# Patient Record
Sex: Male | Born: 1940
Health system: Southern US, Community
[De-identification: ages and names within clinical notes are randomized; demographics above are authoritative.]

## PROBLEM LIST (undated history)

## (undated) DIAGNOSIS — G4733 Obstructive sleep apnea (adult) (pediatric): Secondary | ICD-10-CM

## (undated) DIAGNOSIS — M7989 Other specified soft tissue disorders: Secondary | ICD-10-CM

## (undated) DIAGNOSIS — Z860100 Personal history of colon polyps, unspecified: Secondary | ICD-10-CM

## (undated) DIAGNOSIS — I10 Essential (primary) hypertension: Secondary | ICD-10-CM

## (undated) DIAGNOSIS — R600 Localized edema: Secondary | ICD-10-CM

## (undated) DIAGNOSIS — Z7901 Long term (current) use of anticoagulants: Secondary | ICD-10-CM

## (undated) DIAGNOSIS — F3341 Major depressive disorder, recurrent, in partial remission: Secondary | ICD-10-CM

## (undated) DIAGNOSIS — Z8673 Personal history of transient ischemic attack (TIA), and cerebral infarction without residual deficits: Secondary | ICD-10-CM

## (undated) DIAGNOSIS — K219 Gastro-esophageal reflux disease without esophagitis: Secondary | ICD-10-CM

## (undated) DIAGNOSIS — N401 Enlarged prostate with lower urinary tract symptoms: Secondary | ICD-10-CM

## (undated) DIAGNOSIS — I639 Cerebral infarction, unspecified: Secondary | ICD-10-CM

## (undated) DIAGNOSIS — R319 Hematuria, unspecified: Secondary | ICD-10-CM

## (undated) DIAGNOSIS — I480 Paroxysmal atrial fibrillation: Secondary | ICD-10-CM

## (undated) DIAGNOSIS — C4490 Unspecified malignant neoplasm of skin, unspecified: Secondary | ICD-10-CM

## (undated) DIAGNOSIS — K759 Inflammatory liver disease, unspecified: Secondary | ICD-10-CM

## (undated) DIAGNOSIS — K5900 Constipation, unspecified: Secondary | ICD-10-CM

## (undated) DIAGNOSIS — R011 Cardiac murmur, unspecified: Secondary | ICD-10-CM

## (undated) DIAGNOSIS — I255 Ischemic cardiomyopathy: Secondary | ICD-10-CM

## (undated) DIAGNOSIS — K5790 Diverticulosis of intestine, part unspecified, without perforation or abscess without bleeding: Secondary | ICD-10-CM

## (undated) DIAGNOSIS — M1990 Unspecified osteoarthritis, unspecified site: Secondary | ICD-10-CM

## (undated) DIAGNOSIS — Z973 Presence of spectacles and contact lenses: Secondary | ICD-10-CM

## (undated) DIAGNOSIS — F419 Anxiety disorder, unspecified: Secondary | ICD-10-CM

## (undated) DIAGNOSIS — J392 Other diseases of pharynx: Secondary | ICD-10-CM

## (undated) DIAGNOSIS — Z87442 Personal history of urinary calculi: Secondary | ICD-10-CM

## (undated) DIAGNOSIS — Z95818 Presence of other cardiac implants and grafts: Secondary | ICD-10-CM

## (undated) DIAGNOSIS — F1320 Sedative, hypnotic or anxiolytic dependence, uncomplicated: Secondary | ICD-10-CM

## (undated) DIAGNOSIS — J309 Allergic rhinitis, unspecified: Secondary | ICD-10-CM

## (undated) HISTORY — PX: CHOLECYSTECTOMY: SHX55

## (undated) HISTORY — PX: WRIST FRACTURE SURGERY: SHX121

## (undated) HISTORY — DX: Sedative, hypnotic or anxiolytic dependence, uncomplicated: F13.20

## (undated) HISTORY — PX: FOOT SURGERY: SHX648

## (undated) HISTORY — PX: CYSTOSCOPY: SHX5120

## (undated) HISTORY — PX: APPENDECTOMY: SHX54

## (undated) HISTORY — DX: Major depressive disorder, recurrent, in partial remission: F33.41

## (undated) HISTORY — DX: Cardiac murmur, unspecified: R01.1

---
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
## 2007-10-01 ENCOUNTER — Encounter: Payer: Self-pay | Admitting: Internal Medicine

## 2007-10-03 ENCOUNTER — Encounter: Payer: Self-pay | Admitting: Internal Medicine

## 2007-10-05 ENCOUNTER — Ambulatory Visit: Payer: Self-pay | Admitting: Internal Medicine

## 2007-10-05 DIAGNOSIS — Z860100 Personal history of colon polyps, unspecified: Secondary | ICD-10-CM | POA: Insufficient documentation

## 2007-10-05 DIAGNOSIS — I10 Essential (primary) hypertension: Secondary | ICD-10-CM

## 2007-10-05 DIAGNOSIS — Z87442 Personal history of urinary calculi: Secondary | ICD-10-CM

## 2007-10-05 DIAGNOSIS — K219 Gastro-esophageal reflux disease without esophagitis: Secondary | ICD-10-CM | POA: Insufficient documentation

## 2007-10-05 DIAGNOSIS — J309 Allergic rhinitis, unspecified: Secondary | ICD-10-CM | POA: Insufficient documentation

## 2007-11-12 ENCOUNTER — Ambulatory Visit: Payer: Self-pay | Admitting: Internal Medicine

## 2007-11-12 DIAGNOSIS — J019 Acute sinusitis, unspecified: Secondary | ICD-10-CM

## 2007-11-13 ENCOUNTER — Telehealth (INDEPENDENT_AMBULATORY_CARE_PROVIDER_SITE_OTHER): Payer: Self-pay

## 2008-03-24 ENCOUNTER — Telehealth: Payer: Self-pay | Admitting: Internal Medicine

## 2008-08-21 ENCOUNTER — Ambulatory Visit: Payer: Self-pay | Admitting: Internal Medicine

## 2008-09-15 ENCOUNTER — Ambulatory Visit: Payer: Self-pay | Admitting: Family Medicine

## 2008-10-01 ENCOUNTER — Encounter: Payer: Self-pay | Admitting: Internal Medicine

## 2008-10-02 ENCOUNTER — Ambulatory Visit: Payer: Self-pay | Admitting: Internal Medicine

## 2008-10-02 DIAGNOSIS — G4733 Obstructive sleep apnea (adult) (pediatric): Secondary | ICD-10-CM | POA: Insufficient documentation

## 2008-10-06 LAB — CONVERTED CEMR LAB
Albumin: 4 g/dL (ref 3.5–5.2)
Alkaline Phosphatase: 101 units/L (ref 39–117)
Bilirubin, Direct: 0.1 mg/dL (ref 0.0–0.3)
Calcium: 9.9 mg/dL (ref 8.4–10.5)
Cholesterol: 190 mg/dL (ref 0–200)
Eosinophils Absolute: 0.2 10*3/uL (ref 0.0–0.7)
GFR calc Af Amer: 109 mL/min
GFR calc non Af Amer: 90 mL/min
Glucose, Bld: 114 mg/dL — ABNORMAL HIGH (ref 70–99)
HCT: 44.2 % (ref 39.0–52.0)
HDL: 34.7 mg/dL — ABNORMAL LOW (ref >39.0)
Hemoglobin: 15.4 g/dL (ref 13.0–17.0)
MCHC: 34.7 g/dL (ref 30.0–36.0)
MCV: 89.4 fL (ref 78.0–100.0)
Monocytes Absolute: 0.6 10*3/uL (ref 0.1–1.0)
Monocytes Relative: 9.5 % (ref 3.0–12.0)
Neutro Abs: 3.7 10*3/uL (ref 1.4–7.7)
Platelets: 209 10*3/uL (ref 150–400)
Potassium: 3.6 meq/L (ref 3.5–5.1)
RDW: 11.9 % (ref 11.5–14.6)
Sodium: 145 meq/L (ref 135–145)
TSH: 0.7 microintl units/mL (ref 0.35–5.50)
Total Protein: 7.9 g/dL (ref 6.0–8.3)
Triglycerides: 118 mg/dL (ref 0–149)

## 2008-10-22 ENCOUNTER — Encounter: Payer: Self-pay | Admitting: Internal Medicine

## 2008-10-22 ENCOUNTER — Ambulatory Visit (HOSPITAL_BASED_OUTPATIENT_CLINIC_OR_DEPARTMENT_OTHER): Admission: RE | Admit: 2008-10-22 | Discharge: 2008-10-22 | Payer: Self-pay | Admitting: Internal Medicine

## 2008-11-05 ENCOUNTER — Ambulatory Visit: Payer: Self-pay | Admitting: Pulmonary Disease

## 2008-11-17 ENCOUNTER — Ambulatory Visit: Payer: Self-pay | Admitting: Internal Medicine

## 2008-11-17 LAB — CONVERTED CEMR LAB
AST: 29 units/L (ref 0–37)
Bilirubin, Direct: 0.1 mg/dL (ref 0.0–0.3)
Total Bilirubin: 0.9 mg/dL (ref 0.3–1.2)

## 2009-02-26 ENCOUNTER — Ambulatory Visit: Payer: Self-pay | Admitting: Family Medicine

## 2009-03-10 ENCOUNTER — Ambulatory Visit: Payer: Self-pay | Admitting: Internal Medicine

## 2009-04-08 ENCOUNTER — Ambulatory Visit: Payer: Self-pay | Admitting: Internal Medicine

## 2009-04-08 DIAGNOSIS — N400 Enlarged prostate without lower urinary tract symptoms: Secondary | ICD-10-CM | POA: Insufficient documentation

## 2009-05-08 ENCOUNTER — Encounter: Payer: Self-pay | Admitting: Internal Medicine

## 2009-07-08 ENCOUNTER — Encounter: Payer: Self-pay | Admitting: Internal Medicine

## 2009-07-08 ENCOUNTER — Ambulatory Visit: Payer: Self-pay | Admitting: Internal Medicine

## 2009-08-11 ENCOUNTER — Encounter (INDEPENDENT_AMBULATORY_CARE_PROVIDER_SITE_OTHER): Payer: Self-pay | Admitting: *Deleted

## 2009-08-12 ENCOUNTER — Encounter: Admission: RE | Admit: 2009-08-12 | Discharge: 2009-08-12 | Payer: Self-pay | Admitting: *Deleted

## 2009-11-02 ENCOUNTER — Ambulatory Visit: Payer: Self-pay | Admitting: Internal Medicine

## 2009-11-02 LAB — CONVERTED CEMR LAB
ALT: 28 units/L (ref 0–53)
AST: 23 units/L (ref 0–37)
Alkaline Phosphatase: 71 units/L (ref 39–117)
BUN: 13 mg/dL (ref 6–23)
Bilirubin, Direct: 0 mg/dL (ref 0.0–0.3)
Cholesterol: 180 mg/dL (ref 0–200)
Creatinine, Ser: 1.2 mg/dL (ref 0.4–1.5)
Eosinophils Relative: 5.3 % — ABNORMAL HIGH (ref 0.0–5.0)
GFR calc non Af Amer: 64.18 mL/min (ref >60)
LDL Cholesterol: 103 mg/dL — ABNORMAL HIGH (ref 0–99)
Lymphocytes Relative: 36.9 % (ref 12.0–46.0)
Monocytes Relative: 10.9 % (ref 3.0–12.0)
Neutrophils Relative %: 46.6 % (ref 43.0–77.0)
PSA: 2.41 ng/mL (ref 0.10–4.00)
Platelets: 203 10*3/uL (ref 150.0–400.0)
RBC: 5.09 M/uL (ref 4.22–5.81)
Total Bilirubin: 0.5 mg/dL (ref 0.3–1.2)
Total CHOL/HDL Ratio: 4
Triglycerides: 185 mg/dL — ABNORMAL HIGH (ref 0.0–149.0)
VLDL: 37 mg/dL (ref 0.0–40.0)
WBC: 5.9 10*3/uL (ref 4.5–10.5)

## 2009-11-09 ENCOUNTER — Ambulatory Visit: Payer: Self-pay | Admitting: Internal Medicine

## 2010-03-01 ENCOUNTER — Ambulatory Visit: Payer: Self-pay | Admitting: Internal Medicine

## 2010-03-01 DIAGNOSIS — IMO0002 Reserved for concepts with insufficient information to code with codable children: Secondary | ICD-10-CM

## 2010-05-10 ENCOUNTER — Telehealth: Payer: Self-pay

## 2010-05-28 ENCOUNTER — Ambulatory Visit: Payer: Self-pay | Admitting: Internal Medicine

## 2010-06-10 ENCOUNTER — Telehealth: Payer: Self-pay | Admitting: Internal Medicine

## 2010-07-05 ENCOUNTER — Telehealth: Payer: Self-pay | Admitting: Internal Medicine

## 2010-09-21 ENCOUNTER — Ambulatory Visit
Admission: RE | Admit: 2010-09-21 | Discharge: 2010-09-21 | Payer: Self-pay | Source: Home / Self Care | Attending: Internal Medicine | Admitting: Internal Medicine

## 2010-09-23 ENCOUNTER — Ambulatory Visit
Admission: RE | Admit: 2010-09-23 | Discharge: 2010-09-23 | Payer: Self-pay | Source: Home / Self Care | Attending: Internal Medicine | Admitting: Internal Medicine

## 2010-09-23 ENCOUNTER — Encounter: Payer: Self-pay | Admitting: Internal Medicine

## 2010-10-21 NOTE — Progress Notes (Signed)
Summary: please advise of cough  Phone Note Call from Patient Call back at Home Phone [phone]   Caller: Patient---voice mail Reason for Call: Talk to Nurse Complaint: Cough/Sore throat Summary of Call: Has ? regarding his meds: Losartan HCTZ. Stated this med maybe causing his cough, which has worsened. Please advise. [employer] on [address]. Cough is affecting his job. Initial call taken by: Warnell Forester,  July 05, 2010 1:47 PM  Follow-up for Phone Call        hold losartan  for 4 weeks and assess cough- continue other BP meds; ROV 4 weeks Follow-up by: Gordy Savers  MD,  July 05, 2010 5:31 PM  Additional Follow-up for Phone Call Additional follow up Details #1::        Phone Call Completed.  Left message to inform.   Additional Follow-up by: Rudy Jew, RN,  July 05, 2010 5:42 PM    New/Updated Medications: LOSARTAN POTASSIUM-HCTZ 100-25 MG TABS (LOSARTAN POTASSIUM-HCTZ) one daily  Appended Document: please advise of cough Called again & left number [phone] about Rx.    Appended Document: please advise of cough Talked with patient. He continues to have probelm, but had not stopped the losartan.  Will stop it & see Dr. Kirtland Bouchard 4 weeks.

## 2010-10-21 NOTE — Assessment & Plan Note (Signed)
Summary: SHOULDER PAIN//CCM   Vital Signs:  Patient profile:   70 year old male Weight:      208 pounds Temp:     98.1 degrees F oral BP sitting:   118 / 70  (left arm) Cuff size:   regular  Vitals Entered By: Duard Brady LPN (March 01, 2010 1:50 PM) CC: c/o shoulder pain, (R) foot splinter Is Patient Diabetic? No   CC:  c/o shoulder pain and (R) foot splinter.  History of Present Illness: a 70 year old patient who is seen today with a number of concerns.  He has a couple week history of bilateral shoulder pain related to overuse and vigorous activities.  He also has a splinter in his right plantar foot over the metatarsal heads.  His main complaint is persistent postnasal drip, mild cough, and a constant need to clear his throat.Marland Kitchen  Antihypertensive medication includes lisinopril.  He does have a history of allergic rhinitis.  He has a history also of depression, which has been stable  Allergies (verified): No Known Drug Allergies  Past History:  Past Medical History: Reviewed history from 04/08/2009 and no changes required. Allergic rhinitis Anxiety Colonic polyps, hx of Depression GERD Hypertension Nephrolithiasis, hx of Benign prostatic hypertrophy  Review of Systems       The patient complains of hoarseness and prolonged cough.  The patient denies anorexia, fever, weight loss, weight gain, vision loss, decreased hearing, chest pain, syncope, dyspnea on exertion, peripheral edema, headaches, hemoptysis, abdominal pain, melena, hematochezia, severe indigestion/heartburn, hematuria, incontinence, genital sores, muscle weakness, suspicious skin lesions, transient blindness, difficulty walking, depression, unusual weight change, abnormal bleeding, enlarged lymph nodes, angioedema, breast masses, and testicular masses.    Physical Exam  General:  Well-developed,well-nourished,in no acute distress; alert,appropriate and cooperative throughout examination; normal blood  pressure Head:  Normocephalic and atraumatic without obvious abnormalities. No apparent alopecia or balding. Eyes:  No corneal or conjunctival inflammation noted. EOMI. Perrla. Funduscopic exam benign, without hemorrhages, exudates or papilledema. Vision grossly normal. Mouth:  Oral mucosa and oropharynx without lesions or exudates.  Teeth in good repair. Neck:  No deformities, masses, or tenderness noted. Lungs:  Normal respiratory effort, chest expands symmetrically. Lungs are clear to auscultation, no crackles or wheezes. Heart:  Normal rate and regular rhythm. S1 and S2 normal without gallop, murmur, click, rub or other extra sounds. Abdomen:  Bowel sounds positive,abdomen soft and non-tender without masses, organomegaly or hernias noted. Msk:  range of motion of the shoulders intact, but slightly uncomfortable Skin:  splinter removed from the plantar surface of the right foot near the fourth metatarsal head Cervical Nodes:  No lymphadenopathy noted Axillary Nodes:  No palpable lymphadenopathy   Impression & Recommendations:  Problem # 1:  HYPERTENSION (ICD-401.9)  The following medications were removed from the medication list:    Lisinopril-hydrochlorothiazide 20-12.5 Mg Tabs (Lisinopril-hydrochlorothiazide) .Marland Kitchen... 1 once daily His updated medication list for this problem includes:    Amlodipine Besylate 10 Mg Tabs (Amlodipine besylate) .Marland Kitchen... 1 once daily    Doxazosin Mesylate 4 Mg Tabs (Doxazosin mesylate) ..... One at bedtime    Losartan Potassium-hctz 100-25 Mg Tabs (Losartan potassium-hctz) ..... One daily  The following medications were removed from the medication list:    Lisinopril-hydrochlorothiazide 20-12.5 Mg Tabs (Lisinopril-hydrochlorothiazide) .Marland Kitchen... 1 once daily His updated medication list for this problem includes:    Amlodipine Besylate 10 Mg Tabs (Amlodipine besylate) .Marland Kitchen... 1 once daily    Doxazosin Mesylate 4 Mg Tabs (Doxazosin mesylate) ..... One at bedtime  Losartan Potassium-hctz 100-25 Mg Tabs (Losartan potassium-hctz) ..... One daily  Problem # 2:  FOOT&TOE SUP FB W/O MAJ OPN WND&W/O MENTION INF (ICD-917.6) splinter removed without difficulty  Problem # 3:  ALLERGIC RHINITIS (ICD-477.9)  His updated medication list for this problem includes:    Fluticasone Propionate 50 Mcg/act Susp (Fluticasone propionate) ..... Use each nares daily will try fluticasone, and may OTC antihistamine.  Also discontinue lisinopril and switched to Cozaar  His updated medication list for this problem includes:    Fluticasone Propionate 50 Mcg/act Susp (Fluticasone propionate) ..... Use each nares daily  Complete Medication List: 1)  Prilosec 10 Mg Cpdr (Omeprazole) .Marland Kitchen.. 1 once daily 2)  Adult Aspirin Ec Low Strength 81 Mg Tbec (Aspirin) .Marland Kitchen.. 1 once daily 3)  Amlodipine Besylate 10 Mg Tabs (Amlodipine besylate) .Marland Kitchen.. 1 once daily 4)  Trazodone Hcl 50 Mg Tabs (Trazodone hcl) .Marland Kitchen.. 1 once daily 5)  Lorazepam 1 Mg Tabs (Lorazepam) .... One twice daily 6)  Bupropion Hcl 150 Mg Xr12h-tab (Bupropion hcl) .... One twice daily 7)  Sertraline Hcl 50 Mg Tabs (Sertraline hcl) .... Once daily 8)  Cyclobenzaprine Hcl 10 Mg Tabs (Cyclobenzaprine hcl) .Marland Kitchen.. 1 tablet 3 times daily as needed 9)  Doxazosin Mesylate 4 Mg Tabs (Doxazosin mesylate) .... One at bedtime 10)  Losartan Potassium-hctz 100-25 Mg Tabs (Losartan potassium-hctz) .... One daily 11)  Fluticasone Propionate 50 Mcg/act Susp (Fluticasone propionate) .... Use each nares daily  Patient Instructions: 1)  Please schedule a follow-up appointment in 3 months. 2)  Limit your Sodium (Salt). 3)  Take 400-600mg  of Ibuprofen (Advil, Motrin) with food every 4-6 hours as needed for relief of pain or comfort of fever. 4)   Alavert once daily Prescriptions: FLUTICASONE PROPIONATE 50 MCG/ACT SUSP (FLUTICASONE PROPIONATE) use each nares daily  #1 x 4   Entered and Authorized by:   Gordy Savers  MD   Signed by:   Gordy Savers  MD on 03/01/2010   Method used:   Electronically to        [facility]  [phone]* (retail)       [address]       [place], [place]  [address]       Ph: [phone] or [phone]       Fax: [phone]   RxID:   [id number] LOSARTAN POTASSIUM-HCTZ 100-25 MG TABS (LOSARTAN POTASSIUM-HCTZ) one daily  #90 x 4   Entered and Authorized by:   Gordy Savers  MD   Signed by:   Gordy Savers  MD on 03/01/2010   Method used:   Electronically to        [facility]  [phone]* (retail)       [address]       [place], [place]  [address]       Ph: [phone] or [phone]       Fax: [phone]   RxID:   [id number]

## 2010-10-21 NOTE — Progress Notes (Signed)
Summary: less than 24hrs cancellation  Phone Note Call from Patient Call back at Home Phone [phone]   Caller: Patient Call For: Gordy Savers  MD Summary of Call: pt cancelled ov less than 24 hrs due to employee work shortaged Initial call taken by: Heron Sabins,  June 10, 2010 1:14 PM  Follow-up for Phone Call        was seen 05/28/2010 - rov in 6 mos.   no charge needed - noted. KIK Follow-up by: Duard Brady LPN,  June 10, 2010 1:18 PM

## 2010-10-21 NOTE — Assessment & Plan Note (Signed)
Summary: tb skin//ccm  Nurse Visit   Vitals Entered By: Duard Brady LPN (September 21, 2010 2:31 PM)  Allergies: No Known Drug Allergies  Immunizations Administered:  PPD Skin Test:    Vaccine Type: PPD    Site: left forearm    Mfr: Sanofi Pasteur    Dose: 0.1 ml    Route: ID    Given by: Duard Brady LPN    Exp. Date: 07/22/2011    Lot #: [id number]    Physician counseled: yes  Orders Added: 1)  TB Skin Test [86580] 2)  Admin 1st Vaccine [phone]

## 2010-10-21 NOTE — Letter (Signed)
Summary: TB Skin Test  All     ,     Phone:   Fax:           TB Skin Test    Jared [name]    Date TB Test Placed:  ________________  L or R forearm  TB Test Placed by:  ___________________  Lot #:  __________________        Expiration Date: _____________  Date TB Test Read:  ____________________    Result ___________MM  TB Test Read by:  _______________

## 2010-10-21 NOTE — Assessment & Plan Note (Signed)
Summary: TB reading (patient may be in later than scheduled.)  Nurse Visit   Allergies: No Known Drug Allergies  PPD Results    Date of reading: 09/23/2010    Results: < 5mm    Interpretation: negative

## 2010-10-21 NOTE — Assessment & Plan Note (Signed)
Summary: cpx/mm   Vital Signs:  Patient profile:   70 year old male Height:      71.5 inches Weight:      214 pounds O2 Sat:      97 % on Room air Temp:     98.8 degrees F oral BP sitting:   130 / 78  (right arm) Cuff size:   regular  Vitals Entered By: Duard Brady LPN (November 09, 2009 9:51 AM)  O2 Flow:  Room air CC: cpx - c/o sob with exercise Is Patient Diabetic? No   CC:  cpx - c/o sob with exercise.  History of Present Illness: 70 year old patient who is seen today for a comprehensive evaluation.  Medical problems include anxiety, depression.  He has a history of colonic polyps and his last colonoscopy was approximately 3 years ago.  He is followed by behavioral health.  He has treated hypertension and gastroesophageal reflux disease.  He has mild BPH, and history of obstructive sleep apnea. He has done quite well.  He had a single episode of becoming nauseated and lightheaded at the jamb after a prolonged period of abstinence from exercise.  Denies any exertional chest pain.  EKG reviewed today and was unremarkable.  His gastroesophageal reflux disease.  Has been stable.  Laboratory screen reviewed and unremarkable.  Preventive Screening-Counseling & Management  Alcohol-Tobacco     Smoking Status: never  Allergies: No Known Drug Allergies  Past History:  Past Medical History: Reviewed history from 04/08/2009 and no changes required. Allergic rhinitis Anxiety Colonic polyps, hx of Depression GERD Hypertension Nephrolithiasis, hx of Benign prostatic hypertrophy  Past Surgical History: Appendectomy Cholecystectomy history of fracture, right wrist x 2 history of fracture, left wrist status post surgery, left foot colonoscopy  2008  Family History: Reviewed history from 10/05/2007 and no changes required. father died age 44, MI, history hypertension mother died age 47 MI  No siblings  Social History: Reviewed history from 10/05/2007 and no  changes required. 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 No apparent alopecia or balding. Eyes:  No corneal or conjunctival inflammation noted. EOMI. Perrla. Funduscopic exam benign, without hemorrhages, exudates or papilledema. Vision grossly normal. Ears:  External ear exam shows no significant lesions or deformities.  Otoscopic examination reveals clear canals, tympanic membranes are intact bilaterally without bulging, retraction, inflammation or discharge. Hearing is grossly normal bilaterally. Nose:  External nasal examination shows no deformity or inflammation. Nasal mucosa are pink and moist without lesions or exudates. Mouth:  Oral mucosa and oropharynx without lesions or exudates.  Teeth in good repair. Neck:  No deformities, masses, or tenderness noted. cystic 3-cm mass in the anterior mid neck region Chest Wall:  No deformities, masses, tenderness or gynecomastia noted. Breasts:  No masses or gynecomastia noted Lungs:  Normal respiratory effort, chest expands symmetrically. Lungs are clear to auscultation, no crackles or wheezes. Heart:  Normal rate and regular rhythm. S1 and S2 normal without gallop, murmur, click, rub or other extra sounds. Abdomen:  Bowel sounds positive,abdomen soft and non-tender without masses, organomegaly or hernias noted. Rectal:  No external abnormalities noted. Normal sphincter tone. No rectal masses or tenderness. Genitalia:  Testes bilaterally descended without nodularity, tenderness or masses. No scrotal masses or lesions. No penis lesions or urethral discharge. Prostate:  1+ enlarged.  1+ enlarged.   Msk:  No deformity or scoliosis noted of  thoracic or lumbar spine.   Pulses:  R and L carotid,radial,femoral,dorsalis pedis and posterior tibial pulses are full and equal bilaterally Extremities:  No clubbing, cyanosis, edema, or deformity noted with normal full range of motion of all joints.   Neurologic:  No cranial nerve deficits noted. Station and gait are normal. Plantar reflexes are down-going bilaterally. DTRs are symmetrical throughout. Sensory, motor and coordinative functions appear intact. Skin:  Intact without suspicious lesions or rashes Cervical Nodes:  No lymphadenopathy noted Axillary Nodes:  No palpable lymphadenopathy Inguinal Nodes:  No significant adenopathy Psych:  Cognition and judgment appear intact. Alert and cooperative with normal attention span and concentration. No apparent delusions, illusions, hallucinations   Impression & Recommendations:  Problem # 1:  HEALTH SCREENING (ICD-V70.0)  Complete Medication List: 1)  Prilosec 10 Mg Cpdr (Omeprazole) .Marland Kitchen.. 1 once daily 2)  Adult Aspirin Ec Low Strength 81 Mg Tbec (Aspirin) .Marland Kitchen.. 1 once daily 3)  Amlodipine Besylate 10 Mg Tabs (Amlodipine besylate) .Marland Kitchen.. 1 once daily 4)  Lisinopril-hydrochlorothiazide 20-12.5 Mg Tabs (Lisinopril-hydrochlorothiazide) .Marland Kitchen.. 1 once daily 5)  Trazodone Hcl 50 Mg Tabs (Trazodone hcl) .Marland Kitchen.. 1 once daily 6)  Lorazepam 1 Mg Tabs (Lorazepam) .... One twice daily 7)  Bupropion Hcl 150 Mg Xr12h-tab (Bupropion hcl) .... One twice daily 8)  Sertraline Hcl 50 Mg Tabs (Sertraline hcl) .... Once daily 9)  Cyclobenzaprine Hcl 10 Mg Tabs (Cyclobenzaprine hcl) .Marland Kitchen.. 1 tablet 3 times daily as needed 10)  Doxazosin Mesylate 4 Mg Tabs (Doxazosin mesylate) .... One at bedtime  Other Orders: EKG w/ Interpretation (93000) Prescription Created Electronically [id number])  Patient Instructions: 1)  Please schedule a follow-up appointment in 6 months. 2)  Limit your Sodium (Salt). 3)  It is important that you exercise regularly at least 20 minutes 5  times a week. If you develop chest pain, have severe difficulty breathing, or feel very tired , stop exercising immediately and seek medical attention. Prescriptions: DOXAZOSIN MESYLATE 4 MG TABS (DOXAZOSIN MESYLATE) one at bedtime  #90 x 6   Entered and Authorized by:   Gordy Savers  MD   Signed by:   Gordy Savers  MD on 11/09/2009   Method used:   Print then Give to Patient   RxID:   [id number] TRAZODONE HCL 50 MG  TABS (TRAZODONE HCL) 1 once daily  #90 x 6   Entered and Authorized by:   Gordy Savers  MD   Signed by:   Gordy Savers  MD on 11/09/2009   Method used:   Print then Give to Patient   RxID:   [id number] LISINOPRIL-HYDROCHLOROTHIAZIDE 20-12.5 MG  TABS (LISINOPRIL-HYDROCHLOROTHIAZIDE) 1 once daily  #90 x 6   Entered and Authorized by:   Gordy Savers  MD   Signed by:   Gordy Savers  MD on 11/09/2009   Method used:   Print then Give to Patient   RxID:   [id number] AMLODIPINE BESYLATE 10 MG  TABS (AMLODIPINE BESYLATE) 1 once daily  #90 Each x 6   Entered and Authorized by:   Gordy Savers  MD   Signed by:   Gordy Savers  MD on 11/09/2009   Method used:   Print then Give to Patient   RxID:   [id number] PRILOSEC 10 MG  CPDR (OMEPRAZOLE) 1 once daily  #90 x 6   Entered and Authorized by:  Gordy Savers  MD   Signed by:   Gordy Savers  MD on 11/09/2009   Method used:   Print then Give to Patient   RxID:   [id number] DOXAZOSIN MESYLATE 4 MG TABS (DOXAZOSIN MESYLATE) one at bedtime  #90 x 6   Entered and Authorized by:   Gordy Savers  MD   Signed by:   Gordy Savers  MD on 11/09/2009   Method used:   Electronically to        [facility]  [phone]* (retail)       [address]       [place], [place]  [address]       Ph: [phone] or [phone]       Fax: [phone]   RxID:   [id number] TRAZODONE HCL 50 MG  TABS (TRAZODONE  HCL) 1 once daily  #90 x 6   Entered and Authorized by:   Gordy Savers  MD   Signed by:   Gordy Savers  MD on 11/09/2009   Method used:   Electronically to        [facility]  [phone]* (retail)       [address]       [place], [place]  [address]       Ph: [phone] or [phone]       Fax: [phone]   RxID:   [id number] LISINOPRIL-HYDROCHLOROTHIAZIDE 20-12.5 MG  TABS (LISINOPRIL-HYDROCHLOROTHIAZIDE) 1 once daily  #90 x 6   Entered and Authorized by:   Gordy Savers  MD   Signed by:   Gordy Savers  MD on 11/09/2009   Method used:   Electronically to        [facility]  [phone]* (retail)       [address]       [place], [place]  [address]       Ph: [phone] or [phone]       Fax: [phone]   RxID:   [id number] AMLODIPINE BESYLATE 10 MG  TABS (AMLODIPINE BESYLATE) 1 once daily  #90 Each x 6   Entered and Authorized by:   Gordy Savers  MD   Signed by:   Gordy Savers  MD on 11/09/2009   Method used:   Electronically to        [facility]  [phone]* (retail)       [address]       [place], [place]  [address]       Ph: [phone] or [phone]       Fax: [phone]   RxID:   [id number] PRILOSEC 10 MG  CPDR (OMEPRAZOLE) 1 once daily  #90 x 6   Entered and Authorized by:   Gordy Savers  MD   Signed by:   Gordy Savers  MD on 11/09/2009   Method used:   Electronically to        [facility]  [phone]* (retail)       [address]       [place], [place]  [address]       Ph: [phone] or [phone]       Fax: [phone]   RxID:  [id number]  

## 2010-10-21 NOTE — Progress Notes (Signed)
Summary: cx 6 mos appt.  Phone Note Outgoing Call   Call placed by: Duard Brady LPN,  May 10, 2010 9:20 AM Call placed to: Patient Summary of Call: called pt - 6 mos rov missed - per pt he called last thursday and got recording - cx appt will r/s at a later date. KIK Initial call taken by: Duard Brady LPN,  May 10, 2010 9:22 AM

## 2010-10-21 NOTE — Assessment & Plan Note (Signed)
Summary: 6 MONTH FOLLOW UP/CJR   Vital Signs:  Patient profile:   70 year old male Weight:      212 pounds Temp:     98.0 degrees F oral BP sitting:   120 / 80  (left arm) Cuff size:   regular  Vitals Entered By: Duard Brady LPN (May 28, 2010 10:41 AM) CC: 6 mos rov - doing ok , occasional congestion Is Patient Diabetic? No Flu Vaccine Consent Questions     Do you have a history of severe allergic reactions to this vaccine? no    Any prior history of allergic reactions to egg and/or gelatin? no    Do you have a sensitivity to the preservative Thimersol? no    Do you have a past history of Guillan-Barre Syndrome? no    Do you currently have an acute febrile illness? no    Have you ever had a severe reaction to latex? no    Vaccine information given and explained to patient? yes    Are you currently pregnant? no    Lot Number:[id number]   Exp Date:03/19/2011   Site Given  Left Deltoid IM   CC:  6 mos rov - doing ok  and occasional congestion.  History of Present Illness: 71 -year-old patient who is in today for follow-up of his hypertension.  He has a history of anxiety and depression, as well as gastroesophageal reflux disease.  Continues to have some shoulder discomfort, but recently well-controlled on Aleve.  No new concerns or complaints.  Allergies (verified): No Known Drug Allergies  Review of Systems  The patient denies anorexia, fever, weight loss, weight gain, vision loss, decreased hearing, hoarseness, chest pain, syncope, dyspnea on exertion, peripheral edema, prolonged cough, headaches, hemoptysis, abdominal pain, melena, hematochezia, severe indigestion/heartburn, hematuria, incontinence, genital sores, muscle weakness, suspicious skin lesions, transient blindness, difficulty walking, depression, unusual weight change, abnormal bleeding, enlarged lymph nodes, angioedema, breast masses, and testicular masses.    Physical Exam  General:   Well-developed,well-nourished,in no acute distress; alert,appropriate and cooperative throughout examination; blood pressure 120/78 Head:  Normocephalic and atraumatic without obvious abnormalities. No apparent alopecia or balding. Eyes:  No corneal or conjunctival inflammation noted. EOMI. Perrla. Funduscopic exam benign, without hemorrhages, exudates or papilledema. Vision grossly normal. Mouth:  Oral mucosa and oropharynx without lesions or exudates.  Teeth in good repair. Neck:  No deformities, masses, or tenderness noted. Lungs:  Normal respiratory effort, chest expands symmetrically. Lungs are clear to auscultation, no crackles or wheezes. Heart:  Normal rate and regular rhythm. S1 and S2 normal without gallop, murmur, click, rub or other extra sounds. Abdomen:  Bowel sounds positive,abdomen soft and non-tender without masses, organomegaly or hernias noted. Msk:  No deformity or scoliosis noted of thoracic or lumbar spine.   Pulses:  R and L carotid,radial,femoral,dorsalis pedis and posterior tibial pulses are full and equal bilaterally Extremities:  No clubbing, cyanosis, edema, or deformity noted with normal full range of motion of all joints.     Impression & Recommendations:  Problem # 1:  HYPERTENSION (ICD-401.9)  His updated medication list for this problem includes:    Amlodipine Besylate 10 Mg Tabs (Amlodipine besylate) .Marland Kitchen... 1 once daily    Doxazosin Mesylate 4 Mg Tabs (Doxazosin mesylate) ..... One at bedtime    Losartan Potassium-hctz 100-25 Mg Tabs (Losartan potassium-hctz) ..... One daily  His updated medication list for this problem includes:    Amlodipine Besylate 10 Mg Tabs (Amlodipine besylate) .Marland Kitchen... 1 once daily  Doxazosin Mesylate 4 Mg Tabs (Doxazosin mesylate) ..... One at bedtime    Losartan Potassium-hctz 100-25 Mg Tabs (Losartan potassium-hctz) ..... One daily  Problem # 2:  GERD (ICD-530.81)  His updated medication list for this problem includes:     Prilosec 10 Mg Cpdr (Omeprazole) .Marland Kitchen... 1 once daily  His updated medication list for this problem includes:    Prilosec 10 Mg Cpdr (Omeprazole) .Marland Kitchen... 1 once daily  Complete Medication List: 1)  Prilosec 10 Mg Cpdr (Omeprazole) .Marland Kitchen.. 1 once daily 2)  Adult Aspirin Ec Low Strength 81 Mg Tbec (Aspirin) .Marland Kitchen.. 1 once daily 3)  Amlodipine Besylate 10 Mg Tabs (Amlodipine besylate) .Marland Kitchen.. 1 once daily 4)  Trazodone Hcl 50 Mg Tabs (Trazodone hcl) .Marland Kitchen.. 1 once daily 5)  Lorazepam 1 Mg Tabs (Lorazepam) .... One twice daily 6)  Bupropion Hcl 150 Mg Xr12h-tab (Bupropion hcl) .... One twice daily 7)  Sertraline Hcl 50 Mg Tabs (Sertraline hcl) .... Once daily 8)  Cyclobenzaprine Hcl 10 Mg Tabs (Cyclobenzaprine hcl) .Marland Kitchen.. 1 tablet 3 times daily as needed 9)  Doxazosin Mesylate 4 Mg Tabs (Doxazosin mesylate) .... One at bedtime 10)  Losartan Potassium-hctz 100-25 Mg Tabs (Losartan potassium-hctz) .... One daily 11)  Fluticasone Propionate 50 Mcg/act Susp (Fluticasone propionate) .... Use each nares daily  Other Orders: Flu Vaccine 45yrs + MEDICARE PATIENTS (W1191) Administration Flu vaccine - MCR (Y7829)  Patient Instructions: 1)  Please schedule a follow-up appointment in 6 months for CPX  2)  Advised not to eat any food or drink any liquids after 10 PM the night before your procedure. 3)  Limit your Sodium (Salt) to less than 2 grams a day(slightly less than 1/2 a teaspoon) to prevent fluid retention, swelling, or worsening of symptoms. 4)  It is important that you exercise regularly at least 20 minutes 5 times a week. If you develop chest pain, have severe difficulty breathing, or feel very tired , stop exercising immediately and seek medical attention.

## 2010-11-08 ENCOUNTER — Encounter: Payer: Self-pay | Admitting: Internal Medicine

## 2010-11-08 ENCOUNTER — Ambulatory Visit (INDEPENDENT_AMBULATORY_CARE_PROVIDER_SITE_OTHER): Payer: Self-pay | Admitting: Internal Medicine

## 2010-11-08 DIAGNOSIS — I10 Essential (primary) hypertension: Secondary | ICD-10-CM

## 2010-11-08 NOTE — Patient Instructions (Signed)
Call or return to clinic prn if these symptoms worsen or fail to improve as anticipated.

## 2010-11-08 NOTE — Progress Notes (Signed)
  Subjective:    Patient ID: Jared [name], male    DOB: [birth year], 70 y.o.   MRN: [id number]  HPI   70 year old patient whosustained trauma to his right chest wall area 8 days ago.  He was doing some house repairs on a ladder approximately 5 to 5 a half feet above ground level.  He lost his balance, falling backwards, landing on his right chest wall region.  He continues to have pain involving the right lateral and anterior chest regions.  No shortness of breath, cough, hemoptysis.    Review of Systems  Constitutional: Negative for fever, chills, appetite change and fatigue.  HENT: Negative for hearing loss, ear pain, congestion, sore throat, trouble swallowing, neck stiffness, dental problem, voice change and tinnitus.   Eyes: Negative for pain, discharge and visual disturbance.  Respiratory: Negative for cough, chest tightness, shortness of breath, wheezing and stridor.   Cardiovascular: Positive for chest pain. Negative for palpitations and leg swelling.  Gastrointestinal: Negative for nausea, vomiting, abdominal pain, diarrhea, constipation, blood in stool and abdominal distention.  Genitourinary: Negative for urgency, hematuria, flank pain, discharge, difficulty urinating and genital sores.  Musculoskeletal: Negative for myalgias, back pain, joint swelling, arthralgias and gait problem.  Skin: Negative for rash.  Neurological: Negative for dizziness, syncope, speech difficulty, weakness, numbness and headaches.  Hematological: Negative for adenopathy. Does not bruise/bleed easily.  Psychiatric/Behavioral: Negative for behavioral problems and dysphoric mood. The patient is not nervous/anxious.        Objective:   Physical Exam  Constitutional: He appears well-developed and well-nourished. No distress.  Neck: Normal range of motion. Neck supple.       Freely movable soft tissue nodule in the anterior neck region, which is unchanged  Cardiovascular: Normal rate, regular rhythm and normal  heart sounds.   No murmur heard. Pulmonary/Chest: Effort normal and breath sounds normal. No respiratory distress. He has no wheezes. He has no rales. He exhibits tenderness.       Breath sounds are symmetrical Chest wall tenderness along the right anterolateral chest wall region          Assessment & Plan:  Traumatic right-sided chest wall pain-He does not require stronger analgesics.  He will take anti-inflammatory medications, as needed.  Will call if he develops any worsening symptoms Hypertension stable

## 2010-11-12 ENCOUNTER — Telehealth: Payer: Self-pay | Admitting: Internal Medicine

## 2010-11-12 DIAGNOSIS — R52 Pain, unspecified: Secondary | ICD-10-CM

## 2010-11-12 MED ORDER — HYDROCODONE-ACETAMINOPHEN 5-325 MG PO TABS: 2.0000 | ORAL_TABLET | Freq: Four times a day (QID) | ORAL | Status: DC | PRN

## 2010-11-12 NOTE — Telephone Encounter (Signed)
vicodin 5/325mg  1-2 po q6 hours prn pain, disp #40 with no refill.

## 2010-11-12 NOTE — Telephone Encounter (Signed)
Pt aware rx called to [facility] .KIK

## 2010-11-12 NOTE — Telephone Encounter (Signed)
I dont see any hx of anything being call out in past - please advise

## 2010-11-12 NOTE — Telephone Encounter (Signed)
Pt called and said that Dr Amador Cunas offered pt pain med if needed for cracked ribs, but pt declined. Pt has now decided that he is going to need to get a script for pain med. Pt says that he has taken Hydrocodone in the past. Pls call in to [facility] on [address].

## 2010-11-18 ENCOUNTER — Other Ambulatory Visit: Payer: Self-pay | Admitting: Internal Medicine

## 2010-11-29 ENCOUNTER — Other Ambulatory Visit: Payer: Self-pay | Admitting: Internal Medicine

## 2010-12-03 ENCOUNTER — Other Ambulatory Visit: Payer: Medicare Other | Admitting: Internal Medicine

## 2010-12-03 DIAGNOSIS — E079 Disorder of thyroid, unspecified: Secondary | ICD-10-CM

## 2010-12-03 DIAGNOSIS — R972 Elevated prostate specific antigen [PSA]: Secondary | ICD-10-CM

## 2010-12-03 DIAGNOSIS — E785 Hyperlipidemia, unspecified: Secondary | ICD-10-CM

## 2010-12-03 DIAGNOSIS — Z0000 Encounter for general adult medical examination without abnormal findings: Secondary | ICD-10-CM

## 2010-12-03 LAB — BASIC METABOLIC PANEL
CO2: 31 mEq/L (ref 19–32)
Chloride: 103 mEq/L (ref 96–112)
Creatinine, Ser: 1.2 mg/dL (ref 0.4–1.5)
Glucose, Bld: 102 mg/dL — ABNORMAL HIGH (ref 70–99)

## 2010-12-03 LAB — CBC
MCH: 30.2 pg (ref 26.0–34.0)
MCHC: 34.4 g/dL (ref 30.0–36.0)
MCV: 88 fL (ref 78.0–100.0)
Platelets: 203 10*3/uL (ref 150–400)
RBC: 4.83 MIL/uL (ref 4.22–5.81)

## 2010-12-03 LAB — HEPATIC FUNCTION PANEL
ALT: 32 U/L (ref 0–53)
AST: 30 U/L (ref 0–37)
Albumin: 3.9 g/dL (ref 3.5–5.2)

## 2010-12-03 LAB — LIPID PANEL
Total CHOL/HDL Ratio: 5
Triglycerides: 125 mg/dL (ref 0.0–149.0)

## 2010-12-03 LAB — PSA: PSA: 4.09 ng/mL — ABNORMAL HIGH (ref 0.10–4.00)

## 2010-12-09 ENCOUNTER — Encounter: Payer: Self-pay | Admitting: Internal Medicine

## 2010-12-09 ENCOUNTER — Ambulatory Visit (INDEPENDENT_AMBULATORY_CARE_PROVIDER_SITE_OTHER): Payer: Medicare Other | Admitting: Internal Medicine

## 2010-12-09 VITALS — BP 110/70 | HR 70 | Temp 97.9°F | Resp 18 | Ht 72.5 in | Wt 207.0 lb

## 2010-12-09 DIAGNOSIS — I10 Essential (primary) hypertension: Secondary | ICD-10-CM

## 2010-12-09 DIAGNOSIS — Z0000 Encounter for general adult medical examination without abnormal findings: Secondary | ICD-10-CM

## 2010-12-09 DIAGNOSIS — J309 Allergic rhinitis, unspecified: Secondary | ICD-10-CM

## 2010-12-09 DIAGNOSIS — N400 Enlarged prostate without lower urinary tract symptoms: Secondary | ICD-10-CM

## 2010-12-09 MED ORDER — DOXAZOSIN MESYLATE 4 MG PO TABS: 4.0000 mg | ORAL_TABLET | Freq: Every day | ORAL | Status: DC

## 2010-12-09 MED ORDER — SERTRALINE HCL 50 MG PO TABS: 50.0000 mg | ORAL_TABLET | Freq: Every day | ORAL | Status: DC

## 2010-12-09 MED ORDER — BUPROPION HCL ER (XL) 150 MG PO TB24: 150.0000 mg | ORAL_TABLET | Freq: Two times a day (BID) | ORAL | Status: DC

## 2010-12-09 MED ORDER — AMLODIPINE BESYLATE 10 MG PO TABS: 10.0000 mg | ORAL_TABLET | Freq: Every day | ORAL | Status: DC

## 2010-12-09 MED ORDER — OMEPRAZOLE 10 MG PO CPDR: 10.0000 mg | DELAYED_RELEASE_CAPSULE | Freq: Every day | ORAL | Status: DC

## 2010-12-09 MED ORDER — LOSARTAN POTASSIUM-HCTZ 100-25 MG PO TABS: 1.0000 | ORAL_TABLET | Freq: Every day | ORAL | Status: DC

## 2010-12-09 MED ORDER — LORAZEPAM 1 MG PO TABS: 1.0000 mg | ORAL_TABLET | Freq: Two times a day (BID) | ORAL | Status: DC

## 2010-12-09 NOTE — Patient Instructions (Signed)
Limit your sodium (Salt) intake    It is important that you exercise regularly, at least 20 minutes 3 to 4 times per week.  If you develop chest pain or shortness of breath seek  medical attention.  Please check your blood pressure on a regular basis.  If it is consistently greater than 150/90, please make an office appointment.  Return in 4 months for follow-up  

## 2010-12-09 NOTE — Progress Notes (Signed)
  Subjective:    Patient ID: Jared [name], male    DOB: [birth year], 70 y.o.   MRN: [id number]  HPI   70 year old patient who is seen today for a preventive health examination. Medical problems include anxiety depression. He has been under considerable situational stress due to 2 some personal issues with his daughter. He has treated hypertension and allergic rhinitis. He has a history of BPH.  1. Risk factors, based on past  M,S,F history-   Cardiovascular risk factors include hypertension  2.  Physical activities: no activity restrictions does go to the health club infrequently.  3.  Depression/mood: positive for depression presently is on medication there have been some recent situational stressors  4.  Hearing: no deficits  5.  ADL's: independent in all aspects of daily living  6.  Fall risk:  low  7.  Home safety: no problems identified  8.  Height weight, and visual acuity; height and weight are stable no difficulty with visual acuity  9.  Counseling: more regular exercise encouraged heart healthy diet restricted salt  10. Lab orders based on risk factors: all discussed laboratory studies were reviewed. PSA was slightly elevated we'll recheck in 4-6 months  11. Referral : not appropriate at this time. Will need followup colonoscopy in one year  12. Care plan: heart healthy diet regular exercise all encouraged  13. Cognitive assessment:  Alert and appropriate with normal affect. No cognitive dysfunction.      CC: cpx - c/o sob with exercise.  History of Present Illness:  70 year old patient who is seen today for a comprehensive evaluation. Medical problems include anxiety, depression. He has a history of colonic polyps and his last colonoscopy was approximately 3 years ago. He is followed by behavioral health. He has treated hypertension and gastroesophageal reflux disease. He has mild BPH, and history of obstructive sleep apnea.  He has done quite well. He had a single  episode of becoming nauseated and lightheaded at the jamb after a prolonged period of abstinence from exercise. Denies any exertional chest pain. EKG reviewed today and was unremarkable. His gastroesophageal reflux disease. Has been stable. Laboratory screen reviewed and unremarkable.  Preventive Screening-Counseling & Management  Alcohol-Tobacco  Smoking Status: never  Allergies:  No Known Drug Allergies  Past History:  Past Medical History:  Reviewed history from 04/08/2009 and no changes required.  Allergic rhinitis  Anxiety  Colonic polyps, hx of  Depression  GERD  Hypertension  Nephrolithiasis, hx of  Benign prostatic hypertrophy  Past Surgical History:  Appendectomy  Cholecystectomy  history of fracture, right wrist x 2  history of fracture, left wrist  status post surgery, left foot  colonoscopy 2008  Family History:  Reviewed history from 10/05/2007 and no changes required.  father died age 62, MI, history hypertension  mother died age 88 MI  No siblings  Social History:  Reviewed history from 10/05/2007 and no changes required.  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

## 2010-12-10 ENCOUNTER — Telehealth: Payer: Self-pay

## 2010-12-10 MED ORDER — BUPROPION HCL ER (SR) 150 MG PO TB12: 150.0000 mg | ORAL_TABLET | Freq: Two times a day (BID) | ORAL | Status: DC

## 2010-12-10 NOTE — Telephone Encounter (Signed)
Faxed new rx to [facility] - med error

## 2011-02-01 NOTE — Procedures (Signed)
NAME:  Jared [name], Jared [name] NO.:  [id number]   MEDICAL RECORD NO.:  [id number]          PATIENT TYPE:  OUT   LOCATION:  SLEEP CENTER                 FACILITY[facility]  [facility]   PHYSICIAN:  Barbaraann Share, MD,FCCPDATE OF BIRTH:  24-Jun-1943   DATE OF STUDY:                            NOCTURNAL POLYSOMNOGRAM   REFERRING PHYSICIAN:   REFERRING PHYSICIAN:  Gordy Savers, MD   INDICATIONS FOR THE STUDY:  Hypersomnia with sleep apnea.   EPWORTH SCORE:  9.   SLEEP ARCHITECTURE:  The patient had a total sleep time of 218 minutes  with very little slow wave sleep or REM noted.  Sleep onset latency was  normal at 15 minutes, and REM onset was fairly rapid at 4 minutes.  Sleep efficiency was very poor during the night at 57% with very  fragmented sleep.   RESPIRATORY DATA:  The patient was found to have 11 obstructive apneas  and no hypopneas for an apnea-hypopnea index of only 3 events per hour.  However, he was also found to have 13 respiratory effort related to  arousals, giving him a respiratory disturbance index of 7 events per  hour.  All of the events occurred in the supine position, and there was  intermittent loud snoring noted throughout.   OXYGEN DATA:  There was O2 desaturation as low as 87% with his  obstructive events.   CARDIAC DATA:  Rare PVC noted, but no clinically significant arrhythmias  seen.   MOVEMENT/PARASOMNIA:  The patient was found to have 36 periodic leg  movements with only 1 per hour resulting in arousal or awakening.  This  is considered insignificant.   IMPRESSION/RECOMMENDATIONS:  1. Very mild obstructive sleep apnea/hypopnea syndrome with an apnea-      hypopnea index of 3 events per hour, but a respiratory disturbance      index of 7 events per hour.  The events occurred all in the supine      position, and there was oxygen desaturation as low as 86%.      Treatment for this degree of sleep apnea can include positional      therapy,  weight loss alone if applicable, upper airway surgery,      oral appliance, and also continuous positive airway pressure.  The      decision to treat this mild degree of sleep apnea should primarily      be based upon its      impact to his quality of life.  At this level, it does not      represent a significant issue for his cardiovascular health.  2. Rare premature ventricular contraction, but no clinically      significant arrhythmia seen.      Barbaraann Share, MD,FCCP  Diplomate, American Board of Sleep  Medicine  Electronically Signed     [facility]/MEDQ  D:  11/05/2008 11:12:20  T:  11/05/2008 23:40:30  Job:  [id number]

## 2011-03-10 ENCOUNTER — Encounter: Payer: Self-pay | Admitting: Internal Medicine

## 2011-03-10 ENCOUNTER — Ambulatory Visit (INDEPENDENT_AMBULATORY_CARE_PROVIDER_SITE_OTHER): Payer: Medicare Other | Admitting: Internal Medicine

## 2011-03-10 DIAGNOSIS — I10 Essential (primary) hypertension: Secondary | ICD-10-CM

## 2011-03-10 DIAGNOSIS — M25511 Pain in right shoulder: Secondary | ICD-10-CM

## 2011-03-10 DIAGNOSIS — M25519 Pain in unspecified shoulder: Secondary | ICD-10-CM

## 2011-03-10 NOTE — Progress Notes (Signed)
  Subjective:    Patient ID: Jared [name], male    DOB: 10-29-1940, 70 y.o.   MRN: [id number]  HPI  70 year old patient who is seen today for followup. He had a falloff a ladder after the first of the year and has had the intermittent right shoulder pain. This has flared up more severely recently. No recent trauma. No constitutional complaints    Review of Systems  Musculoskeletal: Positive for arthralgias.       Objective:   Physical Exam  Musculoskeletal:       Fairly full range of motion but painful abduction          Assessment & Plan:   Chronic right shoulder pain. Will refer to orthopedics for further evaluation and treatment. Will continue hydrocodone for pain. Anti-inflammatories recommended

## 2011-03-10 NOTE — Patient Instructions (Signed)
Take 400-600 mg of ibuprofen ( Advil, Motrin) with food every 4 to 6 hours as needed for pain relief or control of fever  Orthopedic referral as discussed  Call or return to clinic prn if these symptoms worsen or fail to improve as anticipated.

## 2011-04-14 ENCOUNTER — Ambulatory Visit: Payer: Medicare Other | Admitting: Internal Medicine

## 2011-04-18 ENCOUNTER — Telehealth: Payer: Self-pay | Admitting: Internal Medicine

## 2011-04-18 NOTE — Telephone Encounter (Signed)
Pt would like to change his pharmacy to [facility] @ [address] [place] [address] Phone: [phone]

## 2011-04-18 NOTE — Telephone Encounter (Signed)
Change made to pharmacy record

## 2011-05-06 ENCOUNTER — Ambulatory Visit (INDEPENDENT_AMBULATORY_CARE_PROVIDER_SITE_OTHER): Payer: Medicare Other | Admitting: Internal Medicine

## 2011-05-06 ENCOUNTER — Encounter: Payer: Self-pay | Admitting: Internal Medicine

## 2011-05-06 DIAGNOSIS — I10 Essential (primary) hypertension: Secondary | ICD-10-CM

## 2011-05-06 DIAGNOSIS — K219 Gastro-esophageal reflux disease without esophagitis: Secondary | ICD-10-CM

## 2011-05-06 DIAGNOSIS — N400 Enlarged prostate without lower urinary tract symptoms: Secondary | ICD-10-CM

## 2011-05-06 DIAGNOSIS — F329 Major depressive disorder, single episode, unspecified: Secondary | ICD-10-CM

## 2011-05-06 NOTE — Progress Notes (Signed)
  Subjective:    Patient ID: Jared [name], male    DOB: 1941/04/09, 70 y.o.   MRN: [id number]  HPI  70 year old patient who is in today for followup of his hypertension. He has recently been seen by dermatology and treated for diffuse actinic keratoses. He is anticipating bilateral shoulder surgery for degenerated rotator cuff tears. The right shoulder is more problematic. His cardiopulmonary status has been stable and his blood pressure is nicely controlled today. He has a history of anxiety depression which has done quite well he remains on sertraline and Wellbutrin. He has anxiety. He has gastroesophageal reflux disease well controlled on omeprazole. No new concerns or complaints.     Review of Systems  Constitutional: Negative for fever, chills, appetite change and fatigue.  HENT: Negative for hearing loss, ear pain, congestion, sore throat, trouble swallowing, neck stiffness, dental problem, voice change and tinnitus.   Eyes: Negative for pain, discharge and visual disturbance.  Respiratory: Negative for cough, chest tightness, wheezing and stridor.   Cardiovascular: Negative for chest pain, palpitations and leg swelling.  Gastrointestinal: Negative for nausea, vomiting, abdominal pain, diarrhea, constipation, blood in stool and abdominal distention.  Genitourinary: Negative for urgency, hematuria, flank pain, discharge, difficulty urinating and genital sores.  Musculoskeletal: Negative for myalgias, back pain, joint swelling, arthralgias and gait problem.  Skin: Negative for rash.  Neurological: Negative for dizziness, syncope, speech difficulty, weakness, numbness and headaches.  Hematological: Negative for adenopathy. Does not bruise/bleed easily.  Psychiatric/Behavioral: Negative for behavioral problems and dysphoric mood. The patient is not nervous/anxious.        Objective:   Physical Exam  Constitutional: He is oriented to person, place, and time. He appears well-developed.    HENT:  Head: Normocephalic.  Right Ear: External ear normal.  Left Ear: External ear normal.  Eyes: Conjunctivae and EOM are normal.  Neck: Normal range of motion.  Cardiovascular: Normal rate and normal heart sounds.   Pulmonary/Chest: Breath sounds normal.  Abdominal: Bowel sounds are normal.  Musculoskeletal: Normal range of motion. He exhibits no edema and no tenderness.  Neurological: He is alert and oriented to person, place, and time.  Psychiatric: He has a normal mood and affect. His behavior is normal.          Assessment & Plan:    hypertension well controlled Anxiety depression. Stable Bilateral degenerated rotator cuff tears. No contraindications to orthopedic surgery. Patient was told it would be gladly take care of any preoperative clearance. His cardiopulmonary status is stable  Recheck in 6 months

## 2011-05-06 NOTE — Patient Instructions (Signed)
Limit your sodium (Salt) intake    It is important that you exercise regularly, at least 20 minutes 3 to 4 times per week.  If you develop chest pain or shortness of breath seek  medical attention.  Return in 6 months for follow-up  

## 2011-08-30 ENCOUNTER — Other Ambulatory Visit: Payer: Self-pay | Admitting: Internal Medicine

## 2011-10-07 ENCOUNTER — Telehealth: Payer: Self-pay | Admitting: Internal Medicine

## 2011-10-07 NOTE — Telephone Encounter (Signed)
Wants to get prescription for shingle shot so he can get it done at his pharmacy. Please call when it's ready.

## 2011-10-07 NOTE — Telephone Encounter (Signed)
Pt aware rx ready for pick up. 

## 2011-10-24 ENCOUNTER — Ambulatory Visit (INDEPENDENT_AMBULATORY_CARE_PROVIDER_SITE_OTHER): Payer: Medicare Other | Admitting: Internal Medicine

## 2011-10-24 DIAGNOSIS — Z23 Encounter for immunization: Secondary | ICD-10-CM

## 2011-10-24 DIAGNOSIS — Z2911 Encounter for prophylactic immunotherapy for respiratory syncytial virus (RSV): Secondary | ICD-10-CM

## 2011-11-04 ENCOUNTER — Ambulatory Visit (INDEPENDENT_AMBULATORY_CARE_PROVIDER_SITE_OTHER): Payer: Medicare Other | Admitting: Internal Medicine

## 2011-11-04 ENCOUNTER — Encounter: Payer: Self-pay | Admitting: Internal Medicine

## 2011-11-04 DIAGNOSIS — F411 Generalized anxiety disorder: Secondary | ICD-10-CM

## 2011-11-04 DIAGNOSIS — I10 Essential (primary) hypertension: Secondary | ICD-10-CM

## 2011-11-04 NOTE — Progress Notes (Signed)
  Subjective:    Patient ID: Jared [name], male    DOB: 1941/02/11, 71 y.o.   MRN: [id number]  HPI  71 year old patient  who is seen today for followup of hypertension. He is followed by psychiatry for anxiety and has done quite well. He did run out of his medications and was off lorazepam through a long weekend and had considerable withdrawal type symptoms with little or no sleep. He has seen orthopedics do to knee pain and he also has a degenerated rotator cuff. His depression has been stable    Review of Systems  Constitutional: Negative for fever, chills, appetite change and fatigue.  HENT: Negative for hearing loss, ear pain, congestion, sore throat, trouble swallowing, neck stiffness, dental problem, voice change and tinnitus.   Eyes: Negative for pain, discharge and visual disturbance.  Respiratory: Negative for cough, chest tightness, wheezing and stridor.   Cardiovascular: Negative for chest pain, palpitations and leg swelling.  Gastrointestinal: Negative for nausea, vomiting, abdominal pain, diarrhea, constipation, blood in stool and abdominal distention.  Genitourinary: Negative for urgency, hematuria, flank pain, discharge, difficulty urinating and genital sores.       Urinary frequency and dribbling  Musculoskeletal: Negative for myalgias, back pain, joint swelling, arthralgias (knee pain and right shoulder pain) and gait problem.  Skin: Negative for rash.  Neurological: Negative for dizziness, syncope, speech difficulty, weakness, numbness and headaches.  Hematological: Negative for adenopathy. Does not bruise/bleed easily.  Psychiatric/Behavioral: Negative for behavioral problems and dysphoric mood. The patient is not nervous/anxious.        Objective:   Physical Exam  Constitutional: He is oriented to person, place, and time. He appears well-developed.  HENT:  Head: Normocephalic.  Right Ear: External ear normal.  Left Ear: External ear normal.  Eyes: Conjunctivae and EOM  are normal.  Neck: Normal range of motion.  Cardiovascular: Normal rate and normal heart sounds.   Pulmonary/Chest: Breath sounds normal.  Abdominal: Bowel sounds are normal.  Musculoskeletal: Normal range of motion. He exhibits no edema and no tenderness.  Neurological: He is alert and oriented to person, place, and time.  Psychiatric: He has a normal mood and affect. His behavior is normal.          Assessment & Plan:    Hypertension stable Osteoarthritis of the knees Anxiety depression stable  Low salt diet encouraged Home blood pressure monitoring recommended  CPX in 6 months

## 2011-11-04 NOTE — Patient Instructions (Signed)
Limit your sodium (Salt) intake  Please check your blood pressure on a regular basis.  If it is consistently greater than 150/90, please make an office appointment.  Return in 6 months for follow-up   

## 2011-11-15 ENCOUNTER — Encounter: Payer: Self-pay | Admitting: Internal Medicine

## 2011-11-15 ENCOUNTER — Ambulatory Visit (INDEPENDENT_AMBULATORY_CARE_PROVIDER_SITE_OTHER): Payer: Medicare Other | Admitting: Internal Medicine

## 2011-11-15 DIAGNOSIS — I10 Essential (primary) hypertension: Secondary | ICD-10-CM

## 2011-11-15 DIAGNOSIS — J069 Acute upper respiratory infection, unspecified: Secondary | ICD-10-CM

## 2011-11-15 DIAGNOSIS — N400 Enlarged prostate without lower urinary tract symptoms: Secondary | ICD-10-CM

## 2011-11-15 MED ORDER — FINASTERIDE 5 MG PO TABS: 5.0000 mg | ORAL_TABLET | Freq: Every day | ORAL | Status: DC

## 2011-11-15 MED ORDER — HYDROCODONE-HOMATROPINE 5-1.5 MG/5ML PO SYRP: 5.0000 mL | ORAL_SOLUTION | Freq: Four times a day (QID) | ORAL | Status: AC | PRN

## 2011-11-15 NOTE — Progress Notes (Signed)
  Subjective:    Patient ID: Jared [name], male    DOB: 27-Jun-1941, 71 y.o.   MRN: [id number]  HPI  71 year old patient who is in today for followup. In the past several days he has had head and chest congestion and sore throat. He has felt unwell. He is remodeling a house and has no central feeding. He has managed to get by with the electric space heater's. He describes the area as being quite dry. Denies any fever chills or productive cough. No wheezing chest pain or shortness of breath. He does have a history of BPH and some obstructive symptoms. Blood pressure is treated with alpha-blocker. He has treated hypertension which has been well-controlled    Review of Systems  Constitutional: Positive for fatigue. Negative for fever, chills and appetite change.  HENT: Negative for hearing loss, ear pain, congestion, sore throat, trouble swallowing, neck stiffness, dental problem, voice change and tinnitus.   Eyes: Negative for pain, discharge and visual disturbance.  Respiratory: Positive for cough. Negative for chest tightness, wheezing and stridor.   Cardiovascular: Negative for chest pain, palpitations and leg swelling.  Gastrointestinal: Negative for nausea, vomiting, abdominal pain, diarrhea, constipation, blood in stool and abdominal distention.  Genitourinary: Positive for urgency, frequency and difficulty urinating. Negative for hematuria, flank pain, discharge and genital sores.  Musculoskeletal: Negative for myalgias, back pain, joint swelling, arthralgias and gait problem.  Skin: Negative for rash.  Neurological: Positive for weakness. Negative for dizziness, syncope, speech difficulty, numbness and headaches.  Hematological: Negative for adenopathy. Does not bruise/bleed easily.  Psychiatric/Behavioral: Negative for behavioral problems and dysphoric mood. The patient is not nervous/anxious.        Objective:   Physical Exam  Constitutional: He is oriented to person, place, and time.  He appears well-developed.  HENT:  Head: Normocephalic.  Right Ear: External ear normal.  Left Ear: External ear normal.  Eyes: Conjunctivae and EOM are normal.  Neck: Normal range of motion.  Cardiovascular: Normal rate and normal heart sounds.   Pulmonary/Chest: Breath sounds normal.  Abdominal: Bowel sounds are normal.  Musculoskeletal: Normal range of motion. He exhibits no edema and no tenderness.  Neurological: He is alert and oriented to person, place, and time.  Psychiatric: He has a normal mood and affect. His behavior is normal.          Assessment & Plan:   Viral URI. Will treat symptomatically BPH. Will Proscar to his present regimen of alpha-blocker Hypertension well controlled

## 2011-11-15 NOTE — Patient Instructions (Signed)
Get plenty of rest, Drink lots of  clear liquids, and use Tylenol or ibuprofen for fever and discomfort.    Call or return to clinic prn if these symptoms worsen or fail to improve as anticipated.  

## 2011-12-01 ENCOUNTER — Other Ambulatory Visit: Payer: Self-pay | Admitting: Internal Medicine

## 2011-12-14 ENCOUNTER — Other Ambulatory Visit: Payer: Self-pay | Admitting: Internal Medicine

## 2011-12-26 ENCOUNTER — Other Ambulatory Visit: Payer: Self-pay | Admitting: Internal Medicine

## 2012-02-08 ENCOUNTER — Other Ambulatory Visit: Payer: Self-pay | Admitting: Internal Medicine

## 2012-05-03 ENCOUNTER — Ambulatory Visit: Payer: Medicare Other | Admitting: Internal Medicine

## 2012-05-14 ENCOUNTER — Encounter: Payer: Self-pay | Admitting: Internal Medicine

## 2012-05-14 ENCOUNTER — Ambulatory Visit (INDEPENDENT_AMBULATORY_CARE_PROVIDER_SITE_OTHER): Payer: Medicare Other | Admitting: Internal Medicine

## 2012-05-14 VITALS — BP 118/90 | Temp 98.1°F | Wt 205.0 lb

## 2012-05-14 DIAGNOSIS — I10 Essential (primary) hypertension: Secondary | ICD-10-CM

## 2012-05-14 DIAGNOSIS — F411 Generalized anxiety disorder: Secondary | ICD-10-CM

## 2012-05-14 NOTE — Patient Instructions (Signed)
Limit your sodium (Salt) intake    It is important that you exercise regularly, at least 20 minutes 3 to 4 times per week.  If you develop chest pain or shortness of breath seek  medical attention.  Please check your blood pressure on a regular basis.  If it is consistently greater than 150/90, please make an office appointment.  Return in 6 months for follow-up   

## 2012-05-14 NOTE — Progress Notes (Signed)
Subjective:    Patient ID: Jared [name], male    DOB: 1940-10-28, 71 y.o.   MRN: [id number]  HPI 71 year old patient who is seen today for followup of hypertension. He is followed by psychiatry for anxiety depression. In general doing quite well. He does have a history of BPH and nephrolithiasis. No concerns or complaints today except for some orthopedic issues. He is followed by orthopedics for bilateral rotator cuff tears and he is also having some right knee pain  Past Medical History  Diagnosis Date  . ALLERGIC RHINITIS 10/05/2007  . ANXIETY 10/05/2007  . BENIGN PROSTATIC HYPERTROPHY 04/08/2009  . COLONIC POLYPS, HX OF 10/05/2007  . DEPRESSION 10/05/2007  . GERD 10/05/2007  . HYPERTENSION 10/05/2007  . NEPHROLITHIASIS, HX OF 10/05/2007  . SLEEP APNEA, OBSTRUCTIVE 10/02/2008    History   Social History  . Marital Status: Widowed    Spouse Name: N/A    Number of Children: N/A  . Years of Education: N/A   Occupational History  . Not on file.   Social History Main Topics  . Smoking status: Never Smoker   . Smokeless tobacco: Never Used  . Alcohol Use: No  . Drug Use: Not on file  . Sexually Active: Not on file   Other Topics Concern  . Not on file   Social History Narrative  . No narrative on file    Past Surgical History  Procedure Date  . Appendectomy   . Cholecystectomy   . Wrist fracture surgery     right x2   left x1  . Foot surgery     left    No family history on file.  No Known Allergies  Current Outpatient Prescriptions on File Prior to Visit  Medication Sig Dispense Refill  . ALPRAZolam (XANAX) 1 MG tablet       . amLODipine (NORVASC) 10 MG tablet TAKE 1 TABLET BY MOUTH EVERY DAY  90 tablet  3  . aspirin 81 MG tablet Take 81 mg by mouth daily.        Marland Kitchen buPROPion (WELLBUTRIN SR) 150 MG 12 hr tablet TAKE 1 TABLET BY MOUTH TWICE A DAY  180 tablet  3  . cyclobenzaprine (FLEXERIL) 10 MG tablet Take 10 mg by mouth 3 (three) times daily as needed.        .  doxazosin (CARDURA) 4 MG tablet TAKE 1 TABLET BY MOUTH AT BEDTIME  90 tablet  3  . finasteride (PROSCAR) 5 MG tablet Take 1 tablet (5 mg total) by mouth daily.  90 tablet  4  . HYDROcodone-acetaminophen (NORCO) 5-325 MG per tablet Take 2 tablets by mouth every 6 (six) hours as needed for pain. 1-2 tablets every 6 hours prn pain  40 tablet  0  . LORazepam (ATIVAN) 1 MG tablet Take 1 tablet (1 mg total) by mouth 2 (two) times daily.  30 tablet  2  . losartan-hydrochlorothiazide (HYZAAR) 100-25 MG per tablet TAKE 1 TABLET BY MOUTH EVERY DAY  90 tablet  3  . omeprazole (PRILOSEC) 10 MG capsule TAKE ONE CAPSULE BY MOUTH EVERY DAY  90 capsule  3  . sertraline (ZOLOFT) 50 MG tablet Take 1 tablet (50 mg total) by mouth daily.  90 tablet  6  . traZODone (DESYREL) 50 MG tablet Take 50 mg by mouth daily.          BP 118/90  Temp 98.1 F (36.7 C) (Oral)  Wt 205 lb (92.987 kg)  Review of Systems  Constitutional: Negative for fever, chills, appetite change and fatigue.  HENT: Negative for hearing loss, ear pain, congestion, sore throat, trouble swallowing, neck stiffness, dental problem, voice change and tinnitus.   Eyes: Negative for pain, discharge and visual disturbance.  Respiratory: Negative for cough, chest tightness, wheezing and stridor.   Cardiovascular: Negative for chest pain, palpitations and leg swelling.  Gastrointestinal: Negative for nausea, vomiting, abdominal pain, diarrhea, constipation, blood in stool and abdominal distention.  Genitourinary: Negative for urgency, hematuria, flank pain, discharge, difficulty urinating and genital sores.  Musculoskeletal: Negative for myalgias, back pain, joint swelling, arthralgias and gait problem.  Skin: Negative for rash.  Neurological: Negative for dizziness, syncope, speech difficulty, weakness, numbness and headaches.  Hematological: Negative for adenopathy. Does not bruise/bleed easily.  Psychiatric/Behavioral: Negative for  behavioral problems and dysphoric mood. The patient is not nervous/anxious.        Objective:   Physical Exam  Constitutional: He is oriented to person, place, and time. He appears well-developed.  HENT:  Head: Normocephalic.  Right Ear: External ear normal.  Left Ear: External ear normal.  Eyes: Conjunctivae and EOM are normal.  Neck: Normal range of motion.  Cardiovascular: Normal rate and normal heart sounds.   Pulmonary/Chest: Breath sounds normal.  Abdominal: Bowel sounds are normal.  Musculoskeletal: Normal range of motion. He exhibits no edema and no tenderness.  Neurological: He is alert and oriented to person, place, and time.  Psychiatric: He has a normal mood and affect. His behavior is normal.          Assessment & Plan:  Hypertension stable Anxiety depression. Followup psychiatry  Medicines refilled CPX 6 months

## 2012-08-21 ENCOUNTER — Encounter: Payer: Self-pay | Admitting: Internal Medicine

## 2012-08-21 ENCOUNTER — Ambulatory Visit (INDEPENDENT_AMBULATORY_CARE_PROVIDER_SITE_OTHER): Payer: 59 | Admitting: Internal Medicine

## 2012-08-21 ENCOUNTER — Telehealth: Payer: Self-pay | Admitting: Family Medicine

## 2012-08-21 VITALS — BP 120/70 | HR 78 | Temp 97.5°F | Resp 18 | Wt 217.0 lb

## 2012-08-21 DIAGNOSIS — S76219A Strain of adductor muscle, fascia and tendon of unspecified thigh, initial encounter: Secondary | ICD-10-CM

## 2012-08-21 DIAGNOSIS — I10 Essential (primary) hypertension: Secondary | ICD-10-CM

## 2012-08-21 DIAGNOSIS — IMO0002 Reserved for concepts with insufficient information to code with codable children: Secondary | ICD-10-CM

## 2012-08-21 NOTE — Telephone Encounter (Signed)
[facility] Triage Call Report Triage Record Num: [id number] Operator: Griselda Miner Patient Name: Jared [name] Call Date & Time: 08/20/2012 5:21:34PM Patient Phone: [phone] PCP: Gordy Savers Patient Gender: Male PCP Fax : [phone] Patient DOB: [birth year] [facility] Name: [facility] Reason for Call: Caller: [name]/Patient; PCP: Eleonore Chiquito [facility]); CB#: [phone]; Call regarding Hernia; Onset noted some soreness in fall with coughing; but today while mulching, patient started coughing and noted soreness and burning. Did not see a bump or budge on right lower groin. Pain did intensify to 9/10 during episode. Patient stopped mulching. With stopping of activity and use of put cold pack discomfort has subsided . Has not had BM today, but is urinating without discomfort. Denies fever or emergent symptoms at this time. Triaged using Abdominal Pain with a disposition to see provider within 2 weeks. Per nursing judgement elevated disposition to see provider in 24 hours. Care advice offered. Appointment made with Dr. Amador Cunas 08/21/12 at 11:00. Caller demonstrated understanding. Protocol(s) Used: Abdominal Pain Recommended Outcome per Protocol: See Provider within 2 Weeks Reason for Outcome: Repeated episodes of abdominal discomfort AND no previous evaluation by provider Care Advice: ~ Call provider if symptoms worsen or new symptoms develop. 12/02

## 2012-08-21 NOTE — Progress Notes (Signed)
Subjective:    Patient ID: Jared [name], male    DOB: 30-Mar-1941, 71 y.o.   MRN: [id number]  HPI  71 year old patient who is seen today with a chief complaint of pain in the right groin area. He was doing heavy lifting and heavy yard work recently and had a burning pain in the right groin region. He was concerned about a hernia. Pain now has resolved. He does have treated hypertension which has been stable.  Past Medical History  Diagnosis Date  . ALLERGIC RHINITIS 10/05/2007  . ANXIETY 10/05/2007  . BENIGN PROSTATIC HYPERTROPHY 04/08/2009  . COLONIC POLYPS, HX OF 10/05/2007  . DEPRESSION 10/05/2007  . GERD 10/05/2007  . HYPERTENSION 10/05/2007  . NEPHROLITHIASIS, HX OF 10/05/2007  . SLEEP APNEA, OBSTRUCTIVE 10/02/2008    History   Social History  . Marital Status: Widowed    Spouse Name: N/A    Number of Children: N/A  . Years of Education: N/A   Occupational History  . Not on file.   Social History Main Topics  . Smoking status: Never Smoker   . Smokeless tobacco: Never Used  . Alcohol Use: No  . Drug Use: Not on file  . Sexually Active: Not on file   Other Topics Concern  . Not on file   Social History Narrative  . No narrative on file    Past Surgical History  Procedure Date  . Appendectomy   . Cholecystectomy   . Wrist fracture surgery     right x2   left x1  . Foot surgery     left    No family history on file.  No Known Allergies  Current Outpatient Prescriptions on File Prior to Visit  Medication Sig Dispense Refill  . ALPRAZolam (XANAX) 1 MG tablet       . amLODipine (NORVASC) 10 MG tablet TAKE 1 TABLET BY MOUTH EVERY DAY  90 tablet  3  . aspirin 81 MG tablet Take 81 mg by mouth daily.        Marland Kitchen BESIVANCE 0.6 % SUSP       . buPROPion (WELLBUTRIN SR) 150 MG 12 hr tablet TAKE 1 TABLET BY MOUTH TWICE A DAY  180 tablet  3  . cyclobenzaprine (FLEXERIL) 10 MG tablet Take 10 mg by mouth 3 (three) times daily as needed.        . doxazosin (CARDURA) 4 MG tablet  TAKE 1 TABLET BY MOUTH AT BEDTIME  90 tablet  3  . DUREZOL 0.05 % EMUL       . finasteride (PROSCAR) 5 MG tablet Take 1 tablet (5 mg total) by mouth daily.  90 tablet  4  . HYDROcodone-acetaminophen (NORCO) 5-325 MG per tablet Take 2 tablets by mouth every 6 (six) hours as needed for pain. 1-2 tablets every 6 hours prn pain  40 tablet  0  . LORazepam (ATIVAN) 1 MG tablet Take 1 tablet (1 mg total) by mouth 2 (two) times daily.  30 tablet  2  . losartan-hydrochlorothiazide (HYZAAR) 100-25 MG per tablet TAKE 1 TABLET BY MOUTH EVERY DAY  90 tablet  3  . NEVANAC 0.1 % ophthalmic suspension       . omeprazole (PRILOSEC) 10 MG capsule TAKE ONE CAPSULE BY MOUTH EVERY DAY  90 capsule  3  . sertraline (ZOLOFT) 50 MG tablet Take 1 tablet (50 mg total) by mouth daily.  90 tablet  6  . timolol (TIMOPTIC) 0.5 % ophthalmic solution       .  traZODone (DESYREL) 50 MG tablet Take 50 mg by mouth daily.          BP 120/70  Pulse 78  Temp 97.5 F (36.4 C) (Oral)  Resp 18  Wt 217 lb (98.431 kg)  SpO2 97%      Review of Systems  Constitutional: Negative for fever, chills, appetite change and fatigue.  HENT: Negative for hearing loss, ear pain, congestion, sore throat, trouble swallowing, neck stiffness, dental problem, voice change and tinnitus.   Eyes: Negative for pain, discharge and visual disturbance.  Respiratory: Negative for cough, chest tightness, wheezing and stridor.   Cardiovascular: Negative for chest pain, palpitations and leg swelling.  Gastrointestinal: Negative for nausea, vomiting, abdominal pain (Right groin pain), diarrhea, constipation, blood in stool and abdominal distention.  Genitourinary: Negative for urgency, hematuria, flank pain, discharge, difficulty urinating and genital sores.  Musculoskeletal: Negative for myalgias, back pain, joint swelling, arthralgias and gait problem.  Skin: Negative for rash.  Neurological: Negative for dizziness, syncope, speech difficulty, weakness,  numbness and headaches.  Hematological: Negative for adenopathy. Does not bruise/bleed easily.  Psychiatric/Behavioral: Negative for behavioral problems and dysphoric mood. The patient is not nervous/anxious.        Objective:   Physical Exam  Constitutional: He appears well-developed and well-nourished. No distress.       Blood pressure well controlled  Abdominal: Soft. Bowel sounds are normal. He exhibits no distension. There is no tenderness. There is no rebound and no guarding.       No obvious hernia although a slight bulge in the right inguinal  canal noted          Assessment & Plan:   Right groin strain. Patient is now asymptomatic will observe Hypertension stable. We'll see back in the time his annual exam

## 2012-08-21 NOTE — Patient Instructions (Signed)
Call or return to clinic prn if these symptoms worsen or fail to improve as anticipated.

## 2012-08-24 ENCOUNTER — Other Ambulatory Visit: Payer: Self-pay | Admitting: Internal Medicine

## 2012-08-25 ENCOUNTER — Other Ambulatory Visit: Payer: Self-pay | Admitting: Internal Medicine

## 2012-09-19 HISTORY — PX: CATARACT EXTRACTION W/ INTRAOCULAR LENS  IMPLANT, BILATERAL: SHX1307

## 2012-09-19 HISTORY — PX: COLONOSCOPY: SHX174

## 2012-09-19 HISTORY — PX: HERNIA REPAIR: SHX51

## 2012-10-04 ENCOUNTER — Ambulatory Visit (INDEPENDENT_AMBULATORY_CARE_PROVIDER_SITE_OTHER): Payer: 59 | Admitting: Internal Medicine

## 2012-10-04 ENCOUNTER — Encounter: Payer: Self-pay | Admitting: Internal Medicine

## 2012-10-04 VITALS — BP 140/90 | HR 84 | Temp 98.1°F | Resp 18 | Wt 219.0 lb

## 2012-10-04 DIAGNOSIS — I10 Essential (primary) hypertension: Secondary | ICD-10-CM

## 2012-10-04 DIAGNOSIS — K4090 Unilateral inguinal hernia, without obstruction or gangrene, not specified as recurrent: Secondary | ICD-10-CM

## 2012-10-04 NOTE — Progress Notes (Signed)
Subjective:    Patient ID: Jared [name], male    DOB: 07/18/1941, 72 y.o.   MRN: [id number]  HPI  72 year old patient who presents with persistent pain in the right groin area. Pain is aggravated by coughing and sneezing and at times by a prolonged standing and walking. He was seen for this one month ago and symptoms have worsened exam at that time revealed a small bulge in the  inguinal canal.  Past Medical History  Diagnosis Date  . ALLERGIC RHINITIS 10/05/2007  . ANXIETY 10/05/2007  . BENIGN PROSTATIC HYPERTROPHY 04/08/2009  . COLONIC POLYPS, HX OF 10/05/2007  . DEPRESSION 10/05/2007  . GERD 10/05/2007  . HYPERTENSION 10/05/2007  . NEPHROLITHIASIS, HX OF 10/05/2007  . SLEEP APNEA, OBSTRUCTIVE 10/02/2008    History   Social History  . Marital Status: Widowed    Spouse Name: N/A    Number of Children: N/A  . Years of Education: N/A   Occupational History  . Not on file.   Social History Main Topics  . Smoking status: Never Smoker   . Smokeless tobacco: Never Used  . Alcohol Use: No  . Drug Use: Not on file  . Sexually Active: Not on file   Other Topics Concern  . Not on file   Social History Narrative  . No narrative on file    Past Surgical History  Procedure Date  . Appendectomy   . Cholecystectomy   . Wrist fracture surgery     right x2   left x1  . Foot surgery     left    No family history on file.  No Known Allergies  Current Outpatient Prescriptions on File Prior to Visit  Medication Sig Dispense Refill  . ALPRAZolam (XANAX) 1 MG tablet       . amLODipine (NORVASC) 10 MG tablet TAKE 1 TABLET BY MOUTH EVERY DAY  90 tablet  3  . aspirin 81 MG tablet Take 81 mg by mouth daily.        Marland Kitchen buPROPion (WELLBUTRIN SR) 150 MG 12 hr tablet TAKE 1 TABLET BY MOUTH TWICE A DAY  180 tablet  3  . doxazosin (CARDURA) 4 MG tablet TAKE 1 TABLET BY MOUTH AT BEDTIME  90 tablet  3  . DUREZOL 0.05 % EMUL       . finasteride (PROSCAR) 5 MG tablet Take 1 tablet (5 mg total) by  mouth daily.  90 tablet  4  . LORazepam (ATIVAN) 1 MG tablet Take 1 tablet (1 mg total) by mouth 2 (two) times daily.  30 tablet  2  . losartan-hydrochlorothiazide (HYZAAR) 100-25 MG per tablet TAKE 1 TABLET BY MOUTH EVERY DAY  90 tablet  3  . omeprazole (PRILOSEC) 10 MG capsule TAKE ONE CAPSULE BY MOUTH EVERY DAY  90 capsule  3  . sertraline (ZOLOFT) 50 MG tablet Take 1 tablet (50 mg total) by mouth daily.  90 tablet  6  . timolol (TIMOPTIC) 0.5 % ophthalmic solution       . traZODone (DESYREL) 50 MG tablet Take 50 mg by mouth daily.          BP 140/90  Pulse 84  Temp 98.1 F (36.7 C) (Oral)  Resp 18  Wt 219 lb (99.338 kg)       Review of Systems  Gastrointestinal:       Right groin pain       Objective:   Physical Exam  Genitourinary:  The testicles were both normal Palpation the right  inguinal canal did reveal a small hernia with cough          Assessment & Plan:   Small but symptomatic right inguinal hernia. We'll set up for general surgical consultation

## 2012-10-04 NOTE — Patient Instructions (Signed)
General surgical consultation as discussed

## 2012-10-08 ENCOUNTER — Encounter: Payer: Self-pay | Admitting: Internal Medicine

## 2012-10-12 ENCOUNTER — Ambulatory Visit (INDEPENDENT_AMBULATORY_CARE_PROVIDER_SITE_OTHER): Payer: 59 | Admitting: Surgery

## 2012-10-12 ENCOUNTER — Encounter (INDEPENDENT_AMBULATORY_CARE_PROVIDER_SITE_OTHER): Payer: Self-pay | Admitting: Surgery

## 2012-10-12 VITALS — BP 130/78 | HR 68 | Temp 97.8°F | Resp 18 | Ht 72.0 in | Wt 212.0 lb

## 2012-10-12 DIAGNOSIS — K4090 Unilateral inguinal hernia, without obstruction or gangrene, not specified as recurrent: Secondary | ICD-10-CM | POA: Insufficient documentation

## 2012-10-12 NOTE — Progress Notes (Signed)
Patient ID: Jared [name], male   DOB: 1940-10-17, 72 y.o.   MRN: [id number]  Chief Complaint  Patient presents with  . Inguinal Hernia    new pt- eval RIH    HPI Jared [name] is a 72 y.o. male.  Referred by Dr. Amador Cunas for evaluation of right inguinal hernia HPI 72 year old male presents with a two-month history of pain and swelling in his right groin. The patient remembers moving something rather heavy and short thereafter experiencing some pain in his groin. This has become progressively worse. He was examined by his primary care physician who noticed a bulge in this area. He has no symptoms on his left side. The patient does report some occasional constipation but has not had any change in his bowel habits since this pain started. No urinary symptoms.  Past Medical History  Diagnosis Date  . ALLERGIC RHINITIS 10/05/2007  . ANXIETY 10/05/2007  . BENIGN PROSTATIC HYPERTROPHY 04/08/2009  . COLONIC POLYPS, HX OF 10/05/2007  . DEPRESSION 10/05/2007  . GERD 10/05/2007  . HYPERTENSION 10/05/2007  . NEPHROLITHIASIS, HX OF 10/05/2007  . SLEEP APNEA, OBSTRUCTIVE 10/02/2008  . Heart murmur     Past Surgical History  Procedure Date  . Appendectomy   . Cholecystectomy   . Wrist fracture surgery     right x2   left x1  . Foot surgery     left  . Cataract extraction     bilateral    History reviewed. No pertinent family history.  Social History History  Substance Use Topics  . Smoking status: Never Smoker   . Smokeless tobacco: Never Used  . Alcohol Use: No    No Known Allergies  Current Outpatient Prescriptions  Medication Sig Dispense Refill  . ALPRAZolam (XANAX) 1 MG tablet       . amLODipine (NORVASC) 10 MG tablet TAKE 1 TABLET BY MOUTH EVERY DAY  90 tablet  3  . aspirin 81 MG tablet Take 81 mg by mouth daily.        Marland Kitchen buPROPion (WELLBUTRIN SR) 150 MG 12 hr tablet TAKE 1 TABLET BY MOUTH TWICE A DAY  180 tablet  3  . doxazosin (CARDURA) 4 MG tablet TAKE 1 TABLET BY MOUTH AT  BEDTIME  90 tablet  3  . DUREZOL 0.05 % EMUL       . finasteride (PROSCAR) 5 MG tablet Take 1 tablet (5 mg total) by mouth daily.  90 tablet  4  . losartan-hydrochlorothiazide (HYZAAR) 100-25 MG per tablet TAKE 1 TABLET BY MOUTH EVERY DAY  90 tablet  3  . omeprazole (PRILOSEC) 10 MG capsule TAKE ONE CAPSULE BY MOUTH EVERY DAY  90 capsule  3  . sertraline (ZOLOFT) 50 MG tablet Take 1 tablet (50 mg total) by mouth daily.  90 tablet  6  . timolol (TIMOPTIC) 0.5 % ophthalmic solution       . traZODone (DESYREL) 50 MG tablet Take 50 mg by mouth daily.          Review of Systems Review of Systems  Constitutional: Negative for fever, chills and unexpected weight change.  HENT: Positive for hearing loss. Negative for congestion, sore throat, trouble swallowing and voice change.   Eyes: Negative for visual disturbance.  Respiratory: Negative for cough and wheezing.   Cardiovascular: Negative for chest pain, palpitations and leg swelling.  Gastrointestinal: Negative for nausea, vomiting, abdominal pain, diarrhea, constipation, blood in stool, abdominal distention, anal bleeding and rectal pain.  Genitourinary: Negative for hematuria  and difficulty urinating.  Musculoskeletal: Negative for arthralgias.  Skin: Negative for rash and wound.  Neurological: Negative for seizures, syncope, weakness and headaches.  Hematological: Negative for adenopathy. Does not bruise/bleed easily.  Psychiatric/Behavioral: Negative for confusion.    Blood pressure 130/78, pulse 68, temperature 97.8 F (36.6 C), temperature source Temporal, resp. rate 18, height 6' (1.829 m), weight 212 lb (96.163 kg).  Physical Exam Physical Exam WDWN in NAD HEENT:  EOMI, sclera anicteric Neck:  No masses, no thyromegaly Lungs:  CTA bilaterally; normal respiratory effort CV:  Regular rate and rhythm; no murmurs Abd:  +bowel sounds, soft, non-tender, no masses GU;  Bilateral descended testes; no testicular masses Visible,  palpable, reducible bulge in right groin; no sign of left inguinal hernia Ext:  Well-perfused; no edema Skin:  Warm, dry; no sign of jaundice  Data Reviewed none  Assessment    Right inguinal hernia - reducible     Plan    Right inguinal hernia repair with mesh.  The surgical procedure has been discussed with the patient.  Potential risks, benefits, alternative treatments, and expected outcomes have been explained.  All of the patient's questions at this time have been answered.  The likelihood of reaching the patient's treatment goal is good.  The patient understand the proposed surgical procedure and wishes to proceed.        Kourosh Jablonsky K. 10/12/2012, 11:05 AM

## 2012-10-14 ENCOUNTER — Other Ambulatory Visit: Payer: Self-pay | Admitting: Internal Medicine

## 2012-10-22 DIAGNOSIS — K4090 Unilateral inguinal hernia, without obstruction or gangrene, not specified as recurrent: Secondary | ICD-10-CM

## 2012-11-13 ENCOUNTER — Ambulatory Visit (INDEPENDENT_AMBULATORY_CARE_PROVIDER_SITE_OTHER): Payer: 59 | Admitting: Surgery

## 2012-11-13 ENCOUNTER — Encounter (INDEPENDENT_AMBULATORY_CARE_PROVIDER_SITE_OTHER): Payer: Self-pay | Admitting: Surgery

## 2012-11-13 VITALS — BP 126/74 | HR 70 | Temp 97.2°F | Resp 12 | Ht 74.0 in | Wt 210.6 lb

## 2012-11-13 DIAGNOSIS — K4090 Unilateral inguinal hernia, without obstruction or gangrene, not specified as recurrent: Secondary | ICD-10-CM

## 2012-11-13 NOTE — Progress Notes (Signed)
Status post open repair of a right inguinal hernia on 10/22/12. This was an indirect hernia. The patient was fairly symptomatic prior surgery but is doing much better. Soreness is resolved. His incision is healing well with no sign of infection. No sign of recurrent hernia. There is no movement with Valsalva maneuver. He may slowly begin increasing his level of activity over the next 2-3 weeks. He may followup with [place] as needed.  Wilmon Arms. Corliss Skains, MD, [facility]  11/13/2012 11:14 AM

## 2012-11-19 ENCOUNTER — Other Ambulatory Visit: Payer: Self-pay | Admitting: Internal Medicine

## 2012-11-25 ENCOUNTER — Other Ambulatory Visit: Payer: Self-pay | Admitting: Internal Medicine

## 2012-11-30 ENCOUNTER — Other Ambulatory Visit: Payer: Self-pay | Admitting: Internal Medicine

## 2012-12-03 ENCOUNTER — Other Ambulatory Visit: Payer: 59

## 2012-12-04 ENCOUNTER — Other Ambulatory Visit: Payer: 59

## 2012-12-04 ENCOUNTER — Other Ambulatory Visit (INDEPENDENT_AMBULATORY_CARE_PROVIDER_SITE_OTHER): Payer: 59

## 2012-12-04 DIAGNOSIS — Z0000 Encounter for general adult medical examination without abnormal findings: Secondary | ICD-10-CM

## 2012-12-04 LAB — CBC WITH DIFFERENTIAL/PLATELET
Basophils Absolute: 0 10*3/uL (ref 0.0–0.1)
Eosinophils Absolute: 0.3 10*3/uL (ref 0.0–0.7)
Lymphocytes Relative: 37.6 % (ref 12.0–46.0)
MCHC: 34.5 g/dL (ref 30.0–36.0)
MCV: 88.2 fl (ref 78.0–100.0)
Monocytes Absolute: 0.5 10*3/uL (ref 0.1–1.0)
Neutrophils Relative %: 47.2 % (ref 43.0–77.0)
Platelets: 226 10*3/uL (ref 150.0–400.0)
RDW: 12.6 % (ref 11.5–14.6)

## 2012-12-04 LAB — POCT URINALYSIS DIPSTICK
Bilirubin, UA: NEGATIVE
Blood, UA: NEGATIVE
Glucose, UA: NEGATIVE
Nitrite, UA: NEGATIVE
Spec Grav, UA: 1.025
pH, UA: 5.5

## 2012-12-04 LAB — BASIC METABOLIC PANEL
BUN: 19 mg/dL (ref 6–23)
Chloride: 98 mEq/L (ref 96–112)
GFR: 75.86 mL/min (ref >60.00)
Potassium: 4.3 mEq/L (ref 3.5–5.1)

## 2012-12-04 LAB — LIPID PANEL
Cholesterol: 186 mg/dL (ref 0–200)
Total CHOL/HDL Ratio: 5
Triglycerides: 212 mg/dL — ABNORMAL HIGH (ref 0.0–149.0)
VLDL: 42.4 mg/dL — ABNORMAL HIGH (ref 0.0–40.0)

## 2012-12-04 LAB — TSH: TSH: 1.84 u[IU]/mL (ref 0.35–5.50)

## 2012-12-04 LAB — HEPATIC FUNCTION PANEL
ALT: 37 U/L (ref 0–53)
AST: 26 U/L (ref 0–37)
Alkaline Phosphatase: 83 U/L (ref 39–117)
Total Bilirubin: 0.7 mg/dL (ref 0.3–1.2)

## 2012-12-06 ENCOUNTER — Other Ambulatory Visit: Payer: Self-pay | Admitting: Internal Medicine

## 2012-12-10 ENCOUNTER — Ambulatory Visit (INDEPENDENT_AMBULATORY_CARE_PROVIDER_SITE_OTHER): Payer: 59 | Admitting: Internal Medicine

## 2012-12-10 ENCOUNTER — Encounter: Payer: Self-pay | Admitting: Internal Medicine

## 2012-12-10 VITALS — BP 120/76 | HR 91 | Temp 97.4°F | Resp 18 | Ht 71.0 in | Wt 214.0 lb

## 2012-12-10 DIAGNOSIS — F329 Major depressive disorder, single episode, unspecified: Secondary | ICD-10-CM

## 2012-12-10 DIAGNOSIS — Z0000 Encounter for general adult medical examination without abnormal findings: Secondary | ICD-10-CM

## 2012-12-10 DIAGNOSIS — Z23 Encounter for immunization: Secondary | ICD-10-CM

## 2012-12-10 DIAGNOSIS — K219 Gastro-esophageal reflux disease without esophagitis: Secondary | ICD-10-CM

## 2012-12-10 DIAGNOSIS — I10 Essential (primary) hypertension: Secondary | ICD-10-CM

## 2012-12-10 DIAGNOSIS — Z86010 Personal history of colonic polyps: Secondary | ICD-10-CM

## 2012-12-10 DIAGNOSIS — Z87442 Personal history of urinary calculi: Secondary | ICD-10-CM

## 2012-12-10 NOTE — Progress Notes (Signed)
Patient ID: Jared [name], male   DOB: May 13, 1941, 72 y.o.   MRN: [id number]  Subjective:    Patient ID: Jared [name], male    DOB: 02/18/1941, 72 y.o.   MRN: [id number]  HPI  72 year-old patient who is seen today for a preventive health examination. Medical problems include anxiety depression. He has been under considerable situational stress due to some personal issues with his daughter. He has treated hypertension and allergic rhinitis. He has a history of BPH.  1. Risk factors, based on past  M,S,F history-   Cardiovascular risk factors include hypertension  2.  Physical activities: no activity restrictions does go to the health club infrequently.  3.  Depression/mood: positive for depression presently is on medication there have been some recent situational stressors; he is interested in tapering and possible discontinuation  4.  Hearing: no deficits  5.  ADL's: independent in all aspects of daily living  6.  Fall risk:  low  7.  Home safety: no problems identified  8.  Height weight, and visual acuity; height and weight are stable no difficulty with visual acuity  9.  Counseling: more regular exercise encouraged heart healthy diet restricted salt  10. Lab orders based on risk factors: all discussed laboratory studies were reviewed. PSA was slightly elevated we'll recheck in 4-6 months  11. Referral : not appropriate at this time. Will need followup colonoscopy in one year  12. Care plan: heart healthy diet regular exercise all encouraged  13. Cognitive assessment:  Alert and appropriate with normal affect. No cognitive dysfunction.       Preventive Screening-Counseling & Management  Alcohol-Tobacco  Smoking Status: never   Allergies:  No Known Drug Allergies   Past History:  Past Medical History:  Reviewed history from 04/08/2009 and no changes required.  Allergic rhinitis  Anxiety  Colonic polyps, hx of  Depression  GERD  Hypertension  Nephrolithiasis, hx of   Benign prostatic hypertrophy   Past Surgical History:   Right inguinal hernia repair 2014  Appendectomy  Cholecystectomy  history of fracture, right wrist x 2  history of fracture, left wrist  status post surgery, left foot  colonoscopy 2008    Family History:  Reviewed history from 10/05/2007 and no changes required.  father died age 30, MI, history hypertension  mother died age 37 MI  No siblings  Social History:  Reviewed history from 10/05/2007 and no changes required.  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  HENT: Negative for hearing loss, ear pain, congestion, rhinorrhea, sneezing, mouth sores, trouble swallowing, neck pain, neck stiffness, dental problem, voice change, sinus pressure and tinnitus.   Eyes: Negative for photophobia, pain, redness and visual disturbance.  Respiratory: Negative for apnea, cough, choking, chest tightness, shortness of breath and wheezing.   Cardiovascular: Negative for chest pain, palpitations and leg swelling.  Gastrointestinal: Negative for nausea, vomiting, abdominal pain, diarrhea, constipation, blood in stool, abdominal distention, anal bleeding and rectal pain.  Genitourinary: Negative for dysuria, urgency, frequency, hematuria, flank pain, decreased urine volume, discharge, penile swelling, scrotal swelling, difficulty urinating, genital sores and testicular pain.  Musculoskeletal: Negative for myalgias, back pain, joint swelling, arthralgias and gait problem.  Skin: Negative for color change, rash and wound.  Neurological: Negative for dizziness, tremors, seizures, syncope,  facial asymmetry, speech difficulty, weakness, light-headedness, numbness and headaches.  Hematological: Negative for adenopathy. Does not  bruise/bleed easily.  Psychiatric/Behavioral: Negative for suicidal ideas, hallucinations, behavioral problems, confusion, sleep disturbance, self-injury, dysphoric mood, decreased concentration and agitation. The patient is not nervous/anxious.        Objective:   Physical Exam  Constitutional: He appears well-developed and well-nourished.  HENT:  Head: Normocephalic and atraumatic.  Right Ear: External ear normal.  Left Ear: External ear normal.  Nose: Nose normal.  Mouth/Throat: Oropharynx is clear and moist.  Eyes: Conjunctivae and EOM are normal. Pupils are equal, round, and reactive to light. No scleral icterus.  Neck: Normal range of motion. Neck supple. No JVD present. No thyromegaly present.  Cardiovascular: Regular rhythm, normal heart sounds and intact distal pulses.  Exam reveals no gallop and no friction rub.   No murmur heard. Pulmonary/Chest: Effort normal and breath sounds normal. He exhibits no tenderness.  Abdominal: Soft. Bowel sounds are normal. He exhibits no distension and no mass. There is no tenderness.  Surgical scars noted  Genitourinary: Prostate normal and penis normal. Guaiac negative stool.  Prostate slightly enlarged in symmetrical  Musculoskeletal: Normal range of motion. He exhibits no edema and no tenderness.  Lymphadenopathy:    He has no cervical adenopathy.  Neurological: He is alert. He has normal reflexes. No cranial nerve deficit. Coordination normal.  Skin: Skin is warm and dry. No rash noted.  Psychiatric: He has a normal mood and affect. His behavior is normal.          Assessment & Plan:  History colonic polyps. We'll schedule for followup colonoscopy. Anxiety depression we'll taper and discontinue Wellbutrin. Hypertension stable Preventive health examination. Laboratory studies reviewed  Recheck 3 months

## 2012-12-10 NOTE — Patient Instructions (Signed)
Limit your sodium (Salt) intake  Please check your blood pressure on a regular basis.  If it is consistently greater than 150/90, please make an office appointment.  Return in 4 months for follow-up    It is important that you exercise regularly, at least 20 minutes 3 to 4 times per week.  If you develop chest pain or shortness of breath seek  medical attention.

## 2012-12-12 ENCOUNTER — Encounter: Payer: Self-pay | Admitting: Internal Medicine

## 2012-12-25 ENCOUNTER — Other Ambulatory Visit: Payer: Self-pay | Admitting: Internal Medicine

## 2012-12-25 ENCOUNTER — Telehealth: Payer: Self-pay | Admitting: Internal Medicine

## 2012-12-25 NOTE — Telephone Encounter (Signed)
Spoke to pt told him to hold Amlodipine and force fluids over the next couple of days and make an office visit next week to reassess blood pressure. Pt verbalized understanding. Told pt if symptoms become worse to call back. Pt verbalized understanding.

## 2012-12-25 NOTE — Telephone Encounter (Signed)
Patient Information:  Caller Name: Ridwan  Phone: [phone]  Patient: Jared [name], Jared [name]  Gender: Male  DOB: 08-Feb-1941  Age: 72 Years  PCP: Eleonore Chiquito [facility])  Office Follow Up:  Does the office need to follow up with this patient?: Yes  Instructions For The Office: see triage note; dizziness after changes in medication 2 weeks ago  krs/can  RN Note:  Recently seen in office for well visit and changed some of his medications.  Onset of lightheadedness when standing up.  Per dizziness protocol, emergent symptoms currently denied; info to office for provider review/Rx change/discussion/callback.  May reach patient at [phone].  krs/can  Symptoms  Reason For Call & Symptoms: Patient calling about dizziness on standing up.  States has not been eating well, but states 4 of his medications discuss dizziness as side effects.  Reviewed Health History In EMR: Yes  Reviewed Medications In EMR: Yes  Reviewed Allergies In EMR: Yes  Reviewed Surgeries / Procedures: Yes  Date of Onset of Symptoms: 12/11/2012  Guideline(s) Used:  Dizziness  Disposition Per Guideline:   Discuss with PCP and Callback by Nurse Today  Reason For Disposition Reached:   Taking a medicine that could cause dizziness (e.g., blood pressure medications, diuretics)  Advice Given:  N/A  Patient Will Follow Care Advice:  YES

## 2012-12-25 NOTE — Telephone Encounter (Signed)
Please call patient and ask him to hold amlodipine and force  fluids over the next couple of days. Office visit next week to reassess blood pressure

## 2013-01-01 ENCOUNTER — Ambulatory Visit (INDEPENDENT_AMBULATORY_CARE_PROVIDER_SITE_OTHER): Payer: 59 | Admitting: Internal Medicine

## 2013-01-01 ENCOUNTER — Encounter: Payer: Self-pay | Admitting: Internal Medicine

## 2013-01-01 VITALS — BP 124/80 | HR 84 | Temp 98.0°F | Resp 18 | Wt 213.0 lb

## 2013-01-01 DIAGNOSIS — I10 Essential (primary) hypertension: Secondary | ICD-10-CM

## 2013-01-01 NOTE — Progress Notes (Signed)
  Subjective:    Patient ID: Jared [name], male    DOB: 06-May-1941, 72 y.o.   MRN: [id number]  HPI  BP Readings from Last 3 Encounters:  01/01/13 124/80  12/10/12 120/76  11/13/12 126/74    Review of Systems     Objective:   Physical Exam        Assessment & Plan:

## 2013-01-01 NOTE — Patient Instructions (Signed)
Please check your blood pressure on a regular basis.  If it is consistently greater than 150/90, please make an office appointment.  

## 2013-01-01 NOTE — Progress Notes (Signed)
Subjective:    Patient ID: ARHUM PEEPLES, male    DOB: 1940/10/21, 72 y.o.   MRN: [id number]  HPI   72 year old patient who has treated hypertension on multiple drugs. The past several days he has had some orthostatic symptoms. While doing yard work he has become quite dizzy frequently when bending over and then standing up. He does have a home blood pressure cuff and at times has documented low blood pressure readings after standing from a sitting position.  Past Medical History  Diagnosis Date  . ALLERGIC RHINITIS 10/05/2007  . ANXIETY 10/05/2007  . BENIGN PROSTATIC HYPERTROPHY 04/08/2009  . COLONIC POLYPS, HX OF 10/05/2007  . DEPRESSION 10/05/2007  . GERD 10/05/2007  . HYPERTENSION 10/05/2007  . NEPHROLITHIASIS, HX OF 10/05/2007  . SLEEP APNEA, OBSTRUCTIVE 10/02/2008  . Heart murmur     History   Social History  . Marital Status: Widowed    Spouse Name: N/A    Number of Children: N/A  . Years of Education: N/A   Occupational History  . Not on file.   Social History Main Topics  . Smoking status: Never Smoker   . Smokeless tobacco: Never Used  . Alcohol Use: No  . Drug Use: No  . Sexually Active: Not on file   Other Topics Concern  . Not on file   Social History Narrative  . No narrative on file    Past Surgical History  Procedure Laterality Date  . Appendectomy    . Cholecystectomy    . Wrist fracture surgery      right x2   left x1  . Foot surgery      left  . Cataract extraction      bilateral  . Hernia repair      History reviewed. No pertinent family history.  No Known Allergies  Current Outpatient Prescriptions on File Prior to Visit  Medication Sig Dispense Refill  . ALPRAZolam (XANAX) 1 MG tablet Take 1 mg by mouth 2 (two) times daily.       Marland Kitchen amLODipine (NORVASC) 10 MG tablet TAKE 1 TABLET BY MOUTH EVERY DAY  90 tablet  3  . aspirin 81 MG tablet Take 81 mg by mouth daily.        Marland Kitchen buPROPion (WELLBUTRIN SR) 150 MG 12 hr tablet TAKE 1 TABLET BY MOUTH  TWICE A DAY  180 tablet  3  . doxazosin (CARDURA) 4 MG tablet TAKE 1 TABLET BY MOUTH AT BEDTIME  90 tablet  3  . finasteride (PROSCAR) 5 MG tablet TAKE 1 TABLET (5 MG TOTAL) BY MOUTH DAILY.  90 tablet  3  . losartan-hydrochlorothiazide (HYZAAR) 100-25 MG per tablet TAKE 1 TABLET BY MOUTH EVERY DAY  90 tablet  3  . omeprazole (PRILOSEC) 10 MG capsule TAKE ONE CAPSULE BY MOUTH EVERY DAY  90 capsule  3  . sertraline (ZOLOFT) 50 MG tablet Take 1 tablet (50 mg total) by mouth daily.  90 tablet  6  . timolol (TIMOPTIC) 0.5 % ophthalmic solution       . traZODone (DESYREL) 50 MG tablet Take 50 mg by mouth daily.         No current facility-administered medications on file prior to visit.    BP 124/80  Pulse 84  Temp(Src) 98 F (36.7 C) (Oral)  Resp 18  Wt 213 lb (96.616 kg)  BMI 29.72 kg/m2  SpO2 97%       Review of Systems  Constitutional: Negative for fever, chills,  appetite change and fatigue.  HENT: Negative for hearing loss, ear pain, congestion, sore throat, trouble swallowing, neck stiffness, dental problem, voice change and tinnitus.   Eyes: Negative for pain, discharge and visual disturbance.  Respiratory: Negative for cough, chest tightness, wheezing and stridor.   Cardiovascular: Negative for chest pain, palpitations and leg swelling.  Gastrointestinal: Negative for nausea, vomiting, abdominal pain, diarrhea, constipation, blood in stool and abdominal distention.  Genitourinary: Negative for urgency, hematuria, flank pain, discharge, difficulty urinating and genital sores.  Musculoskeletal: Negative for myalgias, back pain, joint swelling, arthralgias and gait problem.  Skin: Negative for rash.  Neurological: Positive for light-headedness. Negative for dizziness, syncope, speech difficulty, weakness, numbness and headaches.  Hematological: Negative for adenopathy. Does not bruise/bleed easily.  Psychiatric/Behavioral: Negative for behavioral problems and dysphoric mood. The  patient is not nervous/anxious.        Objective:   Physical Exam  Constitutional: He appears well-developed and well-nourished. No distress.  Blood pressure 110/70 sitting and 100/70 standing          Assessment & Plan:   Hypertension. Patient has some mild orthostatic symptoms and blood pressure now is running in a low-normal range. We'll discontinue amlodipine 10 and continue his other regimen. He will continue to monitor blood pressures carefully at home. He'll report any persistent symptoms or concerns about his blood pressure. Otherwise will return for followup at his usual scheduled time

## 2013-01-14 ENCOUNTER — Ambulatory Visit (AMBULATORY_SURGERY_CENTER): Payer: 59 | Admitting: *Deleted

## 2013-01-14 ENCOUNTER — Telehealth: Payer: Self-pay | Admitting: Gastroenterology

## 2013-01-14 ENCOUNTER — Telehealth: Payer: Self-pay | Admitting: *Deleted

## 2013-01-14 VITALS — Ht 71.0 in | Wt 210.4 lb

## 2013-01-14 DIAGNOSIS — Z1211 Encounter for screening for malignant neoplasm of colon: Secondary | ICD-10-CM

## 2013-01-14 DIAGNOSIS — Z1213 Encounter for screening for malignant neoplasm of small intestine: Secondary | ICD-10-CM

## 2013-01-14 MED ORDER — MOVIPREP 100 G PO SOLR: ORAL | Status: DC

## 2013-01-14 NOTE — Telephone Encounter (Signed)
Pt notified to proceed with colonoscopy as scheduled 

## 2013-01-14 NOTE — Telephone Encounter (Signed)
Received fax confirmation from [facility] saying they have no record of the patient

## 2013-01-14 NOTE — Telephone Encounter (Signed)
It doesn't seem that we will be able to obtain all records, thus we will proceed with surveillance colonoscopy as recommended by his primary care doctor I think he can come direct to procedure, unless he prefers office visit first Thanks

## 2013-01-14 NOTE — Telephone Encounter (Signed)
Dr Rhea Belton: pt scheduled for direct colonoscopy 5/12.  Pt has had 2 colonoscopy procedures.  He cannot tell me when they were performed or who performed them.  One was in [place] and one was in [place].  He said he had polyps with first procedure but not with the second procedure.  He gave me the office of primary care doctor in [place] but could not tell me doctor's name.  He signed release to see if that office may have any records of previous procedures.  Release form given to [facility].   Do you want this pt to have OV with you before scheduling colonoscopy? Please advise.  Thanks, Olegario Messier in [facility]

## 2013-01-16 ENCOUNTER — Encounter: Payer: Self-pay | Admitting: Internal Medicine

## 2013-01-21 ENCOUNTER — Ambulatory Visit (INDEPENDENT_AMBULATORY_CARE_PROVIDER_SITE_OTHER): Payer: 59 | Admitting: Internal Medicine

## 2013-01-21 ENCOUNTER — Encounter: Payer: Self-pay | Admitting: Internal Medicine

## 2013-01-21 ENCOUNTER — Telehealth: Payer: Self-pay | Admitting: Internal Medicine

## 2013-01-21 VITALS — BP 152/100 | HR 85 | Temp 98.5°F | Resp 20 | Wt 215.0 lb

## 2013-01-21 DIAGNOSIS — I10 Essential (primary) hypertension: Secondary | ICD-10-CM

## 2013-01-21 MED ORDER — AMLODIPINE BESYLATE 5 MG PO TABS: 5.0000 mg | ORAL_TABLET | Freq: Every day | ORAL | Status: DC

## 2013-01-21 MED ORDER — AMLODIPINE BESYLATE 10 MG PO TABS: ORAL_TABLET | ORAL | Status: DC

## 2013-01-21 MED ORDER — DOXAZOSIN MESYLATE 8 MG PO TABS: 8.0000 mg | ORAL_TABLET | Freq: Every day | ORAL | Status: DC

## 2013-01-21 NOTE — Patient Instructions (Addendum)
Limit your sodium (Salt) intake  Please check your blood pressure on a regular basis.  If it is consistently greater than 150/90, please make an office appointment.   Limit your sodium (Salt) intake

## 2013-01-21 NOTE — Telephone Encounter (Signed)
appt made for 5/5 at 4:30 - pt aware. Encounter closed.

## 2013-01-21 NOTE — Telephone Encounter (Signed)
Patient Information:  Caller Name: Maritza  Phone: [phone]  Patient: Jared, [name]  Gender: Male  DOB: 07/06/1941  Age: 72 Years  PCP: Eleonore Chiquito [facility])  Office Follow Up:  Does the office need to follow up with this patient?: Yes  Instructions For The Office: No appointments available with PCP this afternoon.  Please follow up regarding appointment this afternoon.  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     Symptoms  Reason For Call & Symptoms: Patient reports concerns about BP; stopped meds for  a few weeks as instructed and current readings are higher than anticipated.  Most recent BP 161/102 on 01/21/13; previously 153/97, 151/82, 133/69, 163/102, 159/95.  Reviewed Health History In EMR: Yes  Reviewed Medications In EMR: Yes  Reviewed Allergies In EMR: Yes  Reviewed Surgeries / Procedures: Yes  Date of Onset of Symptoms: 01/12/2013  Treatments Tried: Resumed Amlodipine 10 mg daily  Treatments Tried Worked: No  Guideline(s) Used:  High Blood Pressure  Disposition Per Guideline:   See Today in Office  Reason For Disposition Reached:   Patient wants to be seen  Advice Given:  General:  Untreated high blood pressure may cause damage to the heart, brain, kidneys, and eyes.  Treatment of high blood pressure can reduce the risk of stroke, heart attack, and heart failure.  The goal of blood pressure treatment for most patients with hypertension is to keep the blood pressure under 140/90.  Call Back If:  Headache, blurred vision, difficulty talking, or difficulty walking occurs  Chest pain or difficulty breathing occurs  You become worse.  Patient Will Follow Care Advice:  YES

## 2013-01-21 NOTE — Progress Notes (Signed)
Subjective:    Patient ID: Jared [name], male    DOB: 19-Apr-1941, 72 y.o.   MRN: [id number]  HPI  72 year old patient who has treated hypertension. At the time of his last visit amlodipine was discontinued due 2 symptomatic mild orthostatic hypotension.  Subsequently blood pressure readings became elevated and about one week ago he resumed amlodipine 10 mg daily. He's had persistent blood pressure elevations and is concerned about a possible cancellation of a schedule colonoscopy in 7 days if blood pressure remains elevated. He generally feels well except for some mild headaches.  Past Medical History  Diagnosis Date  . ALLERGIC RHINITIS 10/05/2007  . ANXIETY 10/05/2007  . BENIGN PROSTATIC HYPERTROPHY 04/08/2009  . COLONIC POLYPS, HX OF 10/05/2007  . DEPRESSION 10/05/2007  . GERD 10/05/2007  . HYPERTENSION 10/05/2007  . NEPHROLITHIASIS, HX OF 10/05/2007  . SLEEP APNEA, OBSTRUCTIVE 10/02/2008  . Heart murmur     History   Social History  . Marital Status: Widowed    Spouse Name: N/A    Number of Children: N/A  . Years of Education: N/A   Occupational History  . Not on file.   Social History Main Topics  . Smoking status: Never Smoker   . Smokeless tobacco: Never Used  . Alcohol Use: No  . Drug Use: No  . Sexually Active: Not on file   Other Topics Concern  . Not on file   Social History Narrative  . No narrative on file    Past Surgical History  Procedure Laterality Date  . Appendectomy    . Cholecystectomy    . Wrist fracture surgery      right x2   left x1  . Foot surgery      left  . Cataract extraction      bilateral  . Hernia repair      Family History  Problem Relation Age of Onset  . Colon cancer Maternal Aunt 70    No Known Allergies  Current Outpatient Prescriptions on File Prior to Visit  Medication Sig Dispense Refill  . ALPRAZolam (XANAX) 1 MG tablet Take 1 mg by mouth 2 (two) times daily.       Marland Kitchen aspirin 81 MG tablet Take 81 mg by mouth daily.         Marland Kitchen buPROPion (WELLBUTRIN SR) 150 MG 12 hr tablet TAKE 1 TABLET BY MOUTH TWICE A DAY  180 tablet  3  . finasteride (PROSCAR) 5 MG tablet TAKE 1 TABLET (5 MG TOTAL) BY MOUTH DAILY.  90 tablet  3  . losartan-hydrochlorothiazide (HYZAAR) 100-25 MG per tablet TAKE 1 TABLET BY MOUTH EVERY DAY  90 tablet  3  . MOVIPREP 100 G SOLR moviprep as directed. No substitutions  1 kit  0  . omeprazole (PRILOSEC) 10 MG capsule TAKE ONE CAPSULE BY MOUTH EVERY DAY  90 capsule  3  . sertraline (ZOLOFT) 50 MG tablet Take 1 tablet (50 mg total) by mouth daily.  90 tablet  6  . timolol (TIMOPTIC) 0.5 % ophthalmic solution       . traZODone (DESYREL) 50 MG tablet Take 50 mg by mouth daily.         No current facility-administered medications on file prior to visit.    BP 152/100  Pulse 85  Temp(Src) 98.5 F (36.9 C) (Oral)  Resp 20  Wt 215 lb (97.523 kg)  BMI 30 kg/m2  SpO2 96%       Review of Systems  Constitutional: Negative  for fever, chills, appetite change and fatigue.  HENT: Negative for hearing loss, ear pain, congestion, sore throat, trouble swallowing, neck stiffness, dental problem, voice change and tinnitus.   Eyes: Negative for pain, discharge and visual disturbance.  Respiratory: Negative for cough, chest tightness, wheezing and stridor.   Cardiovascular: Negative for chest pain, palpitations and leg swelling.  Gastrointestinal: Negative for nausea, vomiting, abdominal pain, diarrhea, constipation, blood in stool and abdominal distention.  Genitourinary: Negative for urgency, hematuria, flank pain, discharge, difficulty urinating and genital sores.  Musculoskeletal: Negative for myalgias, back pain, joint swelling, arthralgias and gait problem.  Skin: Negative for rash.  Neurological: Positive for headaches. Negative for dizziness, syncope, speech difficulty, weakness and numbness.  Hematological: Negative for adenopathy. Does not bruise/bleed easily.  Psychiatric/Behavioral: Negative  for behavioral problems and dysphoric mood. The patient is not nervous/anxious.        Objective:   Physical Exam  Constitutional: He is oriented to person, place, and time. He appears well-developed.  Blood pressure 140/90  HENT:  Head: Normocephalic.  Right Ear: External ear normal.  Left Ear: External ear normal.  Eyes: Conjunctivae and EOM are normal.  Neck: Normal range of motion.  Cardiovascular: Normal rate and normal heart sounds.   Pulmonary/Chest: Breath sounds normal.  Abdominal: Bowel sounds are normal.  Musculoskeletal: Normal range of motion. He exhibits no edema and no tenderness.  Neurological: He is alert and oriented to person, place, and time.  Psychiatric: He has a normal mood and affect. His behavior is normal.          Assessment & Plan:   Hypertension.  We'll continue present  multidrug regimen. Probably has not had the full effect of resuming amlodipine 7 days ago.  We'll continue home blood pressure monitoring

## 2013-01-28 ENCOUNTER — Encounter: Payer: 59 | Admitting: Internal Medicine

## 2013-02-06 ENCOUNTER — Ambulatory Visit (AMBULATORY_SURGERY_CENTER): Payer: Medicare HMO | Admitting: Internal Medicine

## 2013-02-06 ENCOUNTER — Encounter: Payer: Self-pay | Admitting: Internal Medicine

## 2013-02-06 VITALS — BP 136/73 | HR 69 | Temp 96.4°F | Resp 18 | Ht 71.0 in | Wt 210.0 lb

## 2013-02-06 DIAGNOSIS — Z1211 Encounter for screening for malignant neoplasm of colon: Secondary | ICD-10-CM

## 2013-02-06 DIAGNOSIS — D126 Benign neoplasm of colon, unspecified: Secondary | ICD-10-CM

## 2013-02-06 DIAGNOSIS — K5730 Diverticulosis of large intestine without perforation or abscess without bleeding: Secondary | ICD-10-CM | POA: Insufficient documentation

## 2013-02-06 MED ORDER — SODIUM CHLORIDE 0.9 % IV SOLN: 500.0000 mL | INTRAVENOUS | Status: DC

## 2013-02-06 NOTE — Patient Instructions (Addendum)
Discharge instructions given with verbal understanding. Hold aspirin,aspirin products, and anti-inflammatory medications for 1 week. Handouts on polyps,diverticulosis and hemorrhoids given. Resume previous medications. YOU HAD AN ENDOSCOPIC PROCEDURE TODAY AT THE [facility]: Refer to the procedure report that was given to you for any specific questions about what was found during the examination.  If the procedure report does not answer your questions, please call your gastroenterologist to clarify.  If you requested that your care partner not be given the details of your procedure findings, then the procedure report has been included in a sealed envelope for you to review at your convenience later.  YOU SHOULD EXPECT: Some feelings of bloating in the abdomen. Passage of more gas than usual.  Walking can help get rid of the air that was put into your GI tract during the procedure and reduce the bloating. If you had a lower endoscopy (such as a colonoscopy or flexible sigmoidoscopy) you may notice spotting of blood in your stool or on the toilet paper. If you underwent a bowel prep for your procedure, then you may not have a normal bowel movement for a few days.  DIET: Your first meal following the procedure should be a light meal and then it is ok to progress to your normal diet.  A half-sandwich or bowl of soup is an example of a good first meal.  Heavy or fried foods are harder to digest and may make you feel nauseous or bloated.  Likewise meals heavy in dairy and vegetables can cause extra gas to form and this can also increase the bloating.  Drink plenty of fluids but you should avoid alcoholic beverages for 24 hours.  ACTIVITY: Your care partner should take you home directly after the procedure.  You should plan to take it easy, moving slowly for the rest of the day.  You can resume normal activity the day after the procedure however you should NOT DRIVE or use heavy machinery for 24  hours (because of the sedation medicines used during the test).    SYMPTOMS TO REPORT IMMEDIATELY: A gastroenterologist can be reached at any hour.  During normal business hours, 8:30 AM to 5:00 PM Monday through Friday, call [phone].  After hours and on weekends, please call the GI answering service at [phone] who will take a message and have the physician on call contact you.   Following lower endoscopy (colonoscopy or flexible sigmoidoscopy):  Excessive amounts of blood in the stool  Significant tenderness or worsening of abdominal pains  Swelling of the abdomen that is new, acute  Fever of 100F or higher  FOLLOW UP: If any biopsies were taken you will be contacted by phone or by letter within the next 1-3 weeks.  Call your gastroenterologist if you have not heard about the biopsies in 3 weeks.  Our staff will call the home number listed on your records the next business day following your procedure to check on you and address any questions or concerns that you may have at that time regarding the information given to you following your procedure. This is a courtesy call and so if there is no answer at the home number and we have not heard from you through the emergency physician on call, we will assume that you have returned to your regular daily activities without incident.  SIGNATURES/CONFIDENTIALITY: You and/or your care partner have signed paperwork which will be entered into your electronic medical record.  These signatures attest to the fact  that that the information above on your After Visit Summary has been reviewed and is understood.  Full responsibility of the confidentiality of this discharge information lies with you and/or your care-partner.

## 2013-02-06 NOTE — Progress Notes (Signed)
Patient did not experience any of the following events: a burn prior to discharge; a fall within the facility; wrong site/side/patient/procedure/implant event; or a hospital transfer or hospital admission upon discharge from the facility. (G8907) Patient did not have preoperative order for IV antibiotic SSI prophylaxis. (G8918)  

## 2013-02-06 NOTE — Op Note (Signed)
[facility] [address] [place], [address]   COLONOSCOPY PROCEDURE REPORT  PATIENT: Jared, [name]  MR#: [id number] BIRTHDATE: 1940/10/22 , 72  yrs. old GENDER: Male ENDOSCOPIST: Beverley Fiedler, MD REFERRED XB:[id number] Amador Cunas, M.D. PROCEDURE DATE:  02/06/2013 PROCEDURE:   Colonoscopy with snare polypectomy ASA CLASS:   Class III INDICATIONS:elevated risk screening, Patient's personal history of colon polyps, and Last colonoscopy performed greater than 10 years ago ([place], [place]). MEDICATIONS: MAC sedation, administered by CRNA and propofol (Diprivan) 300mg  IV  DESCRIPTION OF PROCEDURE:   After the risks benefits and alternatives of the procedure were thoroughly explained, informed consent was obtained.  A digital rectal exam revealed external hemorrhoids and A digital rectal exam revealed no rectal mass. The LB [id number]  endoscope was introduced through the anus and advanced to the terminal ileum which was intubated for a short distance. No adverse events experienced.   The quality of the prep was good, using MoviPrep  The instrument was then slowly withdrawn as the colon was fully examined.  COLON FINDINGS: The mucosa appeared normal in the terminal ileum. A sessile polyp measuring 7 mm in size was found at the hepatic flexure.  A polypectomy was performed using snare cautery.  The resection was complete and the polyp tissue was completely retrieved.   There was moderate diverticulosis noted in the ascending colon, descending colon, and sigmoid colon with associated angulation.   Small external hemorrhoids were found. Retroflexed views unremarkable. The time to cecum=6 minutes 50 seconds.  Withdrawal time=14 minutes 21 seconds.  The scope was withdrawn and the procedure completed. COMPLICATIONS: There were no complications.  ENDOSCOPIC IMPRESSION: 1.   Normal mucosa in the terminal ileum 2.   Sessile polyp measuring 7 mm in size was found at  the hepatic flexure; polypectomy was performed using snare cautery 3.   There was moderate diverticulosis noted in the ascending colon, descending colon, and sigmoid colon 4.   Small external hemorrhoids  RECOMMENDATIONS: 1.  Hold aspirin, aspirin products, and anti-inflammatory medication for 1 week. 2.  Await pathology results 3.  High fiber diet 4.  Repeat Colonoscopy in 5 years. 5.  You will receive a letter within 1-2 weeks with the results of your biopsy as well as final recommendations.  Please call my office if you have not received a letter after 3 weeks. 6.  MiraLax (over the counter) 17 grams daily for constipation.  eSigned:  Beverley Fiedler, MD 02/06/2013 9:39 AM     cc: The Patient

## 2013-02-06 NOTE — Progress Notes (Signed)
Called to room to assist during endoscopic procedure.  Patient ID and intended procedure confirmed with present staff. Received instructions for my participation in the procedure from the performing physician.  

## 2013-02-07 ENCOUNTER — Telehealth: Payer: Self-pay | Admitting: *Deleted

## 2013-02-07 NOTE — Telephone Encounter (Signed)
Name identifier, left message, follow-up 

## 2013-02-13 ENCOUNTER — Encounter: Payer: Self-pay | Admitting: Internal Medicine

## 2013-02-25 ENCOUNTER — Ambulatory Visit (INDEPENDENT_AMBULATORY_CARE_PROVIDER_SITE_OTHER): Payer: Medicare HMO | Admitting: Internal Medicine

## 2013-02-25 ENCOUNTER — Encounter: Payer: Self-pay | Admitting: Internal Medicine

## 2013-02-25 VITALS — BP 136/80 | HR 91 | Temp 98.1°F | Resp 20 | Wt 216.0 lb

## 2013-02-25 DIAGNOSIS — F411 Generalized anxiety disorder: Secondary | ICD-10-CM

## 2013-02-25 DIAGNOSIS — G4733 Obstructive sleep apnea (adult) (pediatric): Secondary | ICD-10-CM

## 2013-02-25 DIAGNOSIS — I10 Essential (primary) hypertension: Secondary | ICD-10-CM

## 2013-02-25 DIAGNOSIS — F329 Major depressive disorder, single episode, unspecified: Secondary | ICD-10-CM

## 2013-02-25 DIAGNOSIS — F3289 Other specified depressive episodes: Secondary | ICD-10-CM

## 2013-02-25 NOTE — Patient Instructions (Signed)
Limit your sodium (Salt) intake  Please check your blood pressure on a regular basis.  If it is consistently greater than 150/90, please make an office appointment.  Return in 6 months for follow-up   

## 2013-02-25 NOTE — Progress Notes (Signed)
Subjective:    Patient ID: Jared [name], male    DOB: Mar 15, 1941, 72 y.o.   MRN: [id number]  HPI  72 year old patient who is seen today for followup of hypertension. This has done better more recently. His chief complaint today is loud snoring that interferes with the family sleeping and also a considerable daytime sleepiness. He states that he has difficulty staying awake in church and often falls asleep reading. He did have the sleep study done in 2010 the revealed mild OSA only  Past Medical History  Diagnosis Date  . ALLERGIC RHINITIS 10/05/2007  . ANXIETY 10/05/2007  . BENIGN PROSTATIC HYPERTROPHY 04/08/2009  . COLONIC POLYPS, HX OF 10/05/2007  . DEPRESSION 10/05/2007  . GERD 10/05/2007  . HYPERTENSION 10/05/2007  . NEPHROLITHIASIS, HX OF 10/05/2007  . SLEEP APNEA, OBSTRUCTIVE 10/02/2008  . Heart murmur     History   Social History  . Marital Status: Widowed    Spouse Name: N/A    Number of Children: N/A  . Years of Education: N/A   Occupational History  . Not on file.   Social History Main Topics  . Smoking status: Never Smoker   . Smokeless tobacco: Never Used  . Alcohol Use: No  . Drug Use: No  . Sexually Active: Not on file   Other Topics Concern  . Not on file   Social History Narrative  . No narrative on file    Past Surgical History  Procedure Laterality Date  . Appendectomy    . Cholecystectomy    . Wrist fracture surgery      right x2   left x1  . Foot surgery      left  . Cataract extraction      bilateral  . Hernia repair      Family History  Problem Relation Age of Onset  . Colon cancer Maternal Aunt 70    No Known Allergies  Current Outpatient Prescriptions on File Prior to Visit  Medication Sig Dispense Refill  . ALPRAZolam (XANAX) 1 MG tablet Take 1 mg by mouth 2 (two) times daily.       Marland Kitchen amLODipine (NORVASC) 10 MG tablet TAKE 1 TABLET BY MOUTH EVERY DAY  90 tablet  3  . aspirin 81 MG tablet Take 81 mg by mouth daily.        Marland Kitchen buPROPion  (WELLBUTRIN SR) 150 MG 12 hr tablet TAKE 1 TABLET BY MOUTH TWICE A DAY  180 tablet  3  . doxazosin (CARDURA) 8 MG tablet Take 1 tablet (8 mg total) by mouth at bedtime.  90 tablet  3  . finasteride (PROSCAR) 5 MG tablet TAKE 1 TABLET (5 MG TOTAL) BY MOUTH DAILY.  90 tablet  3  . losartan-hydrochlorothiazide (HYZAAR) 100-25 MG per tablet TAKE 1 TABLET BY MOUTH EVERY DAY  90 tablet  3  . omeprazole (PRILOSEC) 10 MG capsule TAKE ONE CAPSULE BY MOUTH EVERY DAY  90 capsule  3  . sertraline (ZOLOFT) 50 MG tablet Take 1 tablet (50 mg total) by mouth daily.  90 tablet  6  . timolol (TIMOPTIC) 0.5 % ophthalmic solution Place 1 drop into the right eye daily.       . traZODone (DESYREL) 50 MG tablet Take 50 mg by mouth daily.         No current facility-administered medications on file prior to visit.    BP 136/80  Pulse 91  Temp(Src) 98.1 F (36.7 C) (Oral)  Resp 20  Wt  216 lb (97.977 kg)  BMI 30.14 kg/m2  SpO2 96%       Review of Systems  Constitutional: Negative for fever, chills, appetite change and fatigue.  HENT: Negative for hearing loss, ear pain, congestion, sore throat, trouble swallowing, neck stiffness, dental problem, voice change and tinnitus.   Eyes: Negative for pain, discharge and visual disturbance.  Respiratory: Negative for cough, chest tightness, wheezing and stridor.   Cardiovascular: Negative for chest pain, palpitations and leg swelling.  Gastrointestinal: Negative for nausea, vomiting, abdominal pain, diarrhea, constipation, blood in stool and abdominal distention.  Genitourinary: Negative for urgency, hematuria, flank pain, discharge, difficulty urinating and genital sores.  Musculoskeletal: Negative for myalgias, back pain, joint swelling, arthralgias and gait problem.  Skin: Negative for rash.  Neurological: Negative for dizziness, syncope, speech difficulty, weakness, numbness and headaches.  Hematological: Negative for adenopathy. Does not bruise/bleed easily.   Psychiatric/Behavioral: Negative for behavioral problems and dysphoric mood. The patient is not nervous/anxious.        Objective:   Physical Exam  Constitutional: He is oriented to person, place, and time. He appears well-developed.  Blood pressure 120/80  HENT:  Head: Normocephalic.  Right Ear: External ear normal.  Left Ear: External ear normal.  Eyes: Conjunctivae and EOM are normal.  Neck: Normal range of motion.  Cardiovascular: Normal rate and normal heart sounds.   Pulmonary/Chest: Breath sounds normal.  Abdominal: Bowel sounds are normal.  Musculoskeletal: Normal range of motion. He exhibits no edema and no tenderness.  Neurological: He is alert and oriented to person, place, and time.  Psychiatric: He has a normal mood and affect. His behavior is normal.          Assessment & Plan:   Hypertension stable OSA. This was fairly mild by a prior study but the patient feels this was a daytime study and was not very representative. He wishes to repeat and consider more aggressive treatment if indicated

## 2013-03-04 ENCOUNTER — Ambulatory Visit: Payer: Medicare HMO

## 2013-03-04 DIAGNOSIS — G4710 Hypersomnia, unspecified: Secondary | ICD-10-CM

## 2013-03-04 DIAGNOSIS — G479 Sleep disorder, unspecified: Secondary | ICD-10-CM

## 2013-03-04 DIAGNOSIS — G4733 Obstructive sleep apnea (adult) (pediatric): Secondary | ICD-10-CM

## 2013-03-04 DIAGNOSIS — R0989 Other specified symptoms and signs involving the circulatory and respiratory systems: Secondary | ICD-10-CM

## 2013-03-04 DIAGNOSIS — R0609 Other forms of dyspnea: Secondary | ICD-10-CM

## 2013-03-05 DIAGNOSIS — G4733 Obstructive sleep apnea (adult) (pediatric): Secondary | ICD-10-CM

## 2013-03-07 ENCOUNTER — Other Ambulatory Visit: Payer: Self-pay | Admitting: Internal Medicine

## 2013-03-07 ENCOUNTER — Encounter: Payer: Self-pay | Admitting: Internal Medicine

## 2013-03-07 ENCOUNTER — Telehealth: Payer: Self-pay | Admitting: *Deleted

## 2013-03-07 DIAGNOSIS — G4733 Obstructive sleep apnea (adult) (pediatric): Secondary | ICD-10-CM

## 2013-03-07 NOTE — Telephone Encounter (Signed)
Spoke to pt told him sleep study showed needs to have a CPAP Titration Study done and referral to Pulmonology was done and someone will be contacting you for further testing. Pt verbalized understanding.

## 2013-03-07 NOTE — Telephone Encounter (Signed)
Message copied by Jimmye Norman on Thu Mar 07, 2013  8:27 AM ------      Message from: Gordy Savers      Created: Thu Mar 07, 2013  7:59 AM       Please schedule in lab CPAP titration study.  Notify patient of the need for additional testing            ----- Message -----         From: Coralyn Helling, MD         Sent: 03/06/2013   4:31 PM           To: Gordy Savers, MD            Theron Arista,            I reviewed home sleep study for Mr. Oyervides from 03/04/13.            Shows mild to moderate sleep apnea with AHI 14.5 and SpO2 < 86%.            Also had 123 min with SpO2 < 90%, raising concern for sleep related hypoventilation.            He should have in lab CPAP titration study to determine whether he needs CPAP vs BiPAP +/- supplemental oxygen at night.            Study should be scanned into Epic soon.            Let me know if I can be of further assistance.            Thanks.            Vineet       ------

## 2013-04-05 ENCOUNTER — Ambulatory Visit (INDEPENDENT_AMBULATORY_CARE_PROVIDER_SITE_OTHER): Payer: Medicare HMO | Admitting: Pulmonary Disease

## 2013-04-05 ENCOUNTER — Encounter: Payer: Self-pay | Admitting: Pulmonary Disease

## 2013-04-05 VITALS — BP 120/80 | HR 70 | Temp 98.2°F | Ht 71.0 in | Wt 223.6 lb

## 2013-04-05 DIAGNOSIS — G4733 Obstructive sleep apnea (adult) (pediatric): Secondary | ICD-10-CM

## 2013-04-05 NOTE — Progress Notes (Deleted)
  Subjective:    Patient ID: Jared [name], male    DOB: 05/18/1941, 72 y.o.   MRN: [id number]  HPI    Review of Systems  Constitutional: Positive for unexpected weight change. Negative for fever.  HENT: Positive for sinus pressure. Negative for ear pain, nosebleeds, congestion, sore throat, rhinorrhea, sneezing, trouble swallowing, dental problem and postnasal drip.   Eyes: Negative for redness and itching.  Respiratory: Negative for cough, chest tightness, shortness of breath and wheezing.   Cardiovascular: Negative for palpitations and leg swelling.  Gastrointestinal: Negative for nausea and vomiting.  Genitourinary: Negative for dysuria.  Musculoskeletal: Negative for joint swelling.  Skin: Negative for rash.  Neurological: Negative for headaches.  Hematological: Does not bruise/bleed easily.  Psychiatric/Behavioral: Negative for dysphoric mood. The patient is not nervous/anxious.        Objective:   Physical Exam        Assessment & Plan:

## 2013-04-05 NOTE — Progress Notes (Signed)
Chief Complaint  Patient presents with  . Sleep Apnea    issues with sleep for decades per pt.  loud snoring that pt has been told.    History of Present Illness: Jared [name] is a 72 y.o. male for evaluation of Obstructive Sleep Apnea.  He has history of snoring and sleep disruption.  He has been told that he stops breathing while asleep, and wakes up feeling like he can't breath.  He had home sleep study which showed mild to moderate sleep apnea.  He was referred to pulmonary/sleep medicine for further assessment.  He goes to sleep at 11 pm.  He falls asleep after about 30 minutes.  He wakes up a few times to use the bathroom.  He gets out of bed at 7.  He feels tired in the morning.  He denies morning headache.  He does not use anything to help him stay awake.  He uses trazodone to help him sleep.  He denies sleep walking, sleep talking, bruxism, or nightmares.  There is no history of restless legs.  He denies sleep hallucinations, sleep paralysis, or cataplexy.  The Epworth score is 10 out of 24.  Tests: Home sleep study 03/04/13 >> AHI 14.5, SpO2 86%  Jared [name]  has a past medical history of ALLERGIC RHINITIS (10/05/2007); ANXIETY (10/05/2007); BENIGN PROSTATIC HYPERTROPHY (04/08/2009); COLONIC POLYPS, HX OF (10/05/2007); DEPRESSION (10/05/2007); GERD (10/05/2007); HYPERTENSION (10/05/2007); NEPHROLITHIASIS, HX OF (10/05/2007); SLEEP APNEA, OBSTRUCTIVE (10/02/2008); and Heart murmur.  Jared [name]  has past surgical history that includes Appendectomy; Cholecystectomy; Wrist fracture surgery; Foot surgery; Cataract extraction; and Hernia repair.  Prior to Admission medications   Medication Sig Start Date End Date Taking? Authorizing Provider  ALPRAZolam Prudy Feeler) 1 MG tablet Take 1 mg by mouth 2 (two) times daily.  10/17/11   Historical Provider, MD  amLODipine (NORVASC) 10 MG tablet TAKE 1 TABLET BY MOUTH EVERY DAY 01/21/13   Gordy Savers, MD  aspirin 81 MG tablet Take 81 mg by mouth  daily.      Historical Provider, MD  buPROPion [facility] SR) 150 MG 12 hr tablet TAKE 1 TABLET BY MOUTH TWICE A DAY 12/14/11   Gordy Savers, MD  doxazosin (CARDURA) 8 MG tablet Take 1 tablet (8 mg total) by mouth at bedtime. 01/21/13   Gordy Savers, MD  finasteride (PROSCAR) 5 MG tablet TAKE 1 TABLET (5 MG TOTAL) BY MOUTH DAILY. 11/30/12   Gordy Savers, MD  losartan-hydrochlorothiazide [facility]) 100-25 MG per tablet TAKE 1 TABLET BY MOUTH EVERY DAY 12/25/12   Gordy Savers, MD  omeprazole (PRILOSEC) 10 MG capsule TAKE ONE CAPSULE BY MOUTH EVERY DAY 08/25/12   Gordy Savers, MD  sertraline (ZOLOFT) 50 MG tablet Take 1 tablet (50 mg total) by mouth daily. 12/09/10   Gordy Savers, MD  timolol (TIMOPTIC) 0.5 % ophthalmic solution Place 1 drop into the right eye daily.  05/11/12   Historical Provider, MD  traZODone (DESYREL) 50 MG tablet Take 50 mg by mouth daily.      Historical Provider, MD    No Known Allergies  His family history includes Colon cancer (age of onset: 88) in his maternal aunt.  He  reports that he has never smoked. He has never used smokeless tobacco. He reports that he does not drink alcohol or use illicit drugs.  Review of Systems  Constitutional: Positive for unexpected weight change. Negative for fever.  HENT: Positive for sinus pressure. Negative for  ear pain, nosebleeds, congestion, sore throat, rhinorrhea, sneezing, trouble swallowing, dental problem and postnasal drip.   Eyes: Negative for redness and itching.  Respiratory: Negative for cough, chest tightness, shortness of breath and wheezing.   Cardiovascular: Negative for palpitations and leg swelling.  Gastrointestinal: Negative for nausea and vomiting.  Genitourinary: Negative for dysuria.  Musculoskeletal: Negative for joint swelling.  Skin: Negative for rash.  Neurological: Negative for headaches.  Hematological: Does not bruise/bleed easily.  Psychiatric/Behavioral: Negative  for dysphoric mood. The patient is not nervous/anxious.    Physical Exam:  General - No distress ENT - No sinus tenderness, no oral exudate, MP 3, scalloped tongue, mild overbite, no LAN, no thyromegaly, TM clear, pupils equal/reactive Cardiac - s1s2 regular, no murmur, pulses symmetric Chest - No wheeze/rales/dullness, good air entry, normal respiratory excursion Back - No focal tenderness Abd - Soft, non-tender, no organomegaly, + bowel sounds Ext - No edema Neuro - Normal strength, cranial nerves intact Skin - No rashes Psych - Normal mood, and behavior

## 2013-04-05 NOTE — Patient Instructions (Signed)
Will arrange for CPAP set up  Follow up in 2 months after CPAP set up 

## 2013-04-05 NOTE — Assessment & Plan Note (Signed)
He has snoring, sleep disruption, and daytime sleepiness.  He has history of depression, and hypertension.  His recent home sleep study showed mild to moderate obstructive sleep apnea.  I have reviewed the recent sleep study results with the patient.  We discussed how sleep apnea can affect various health problems including risks for hypertension, cardiovascular disease, and diabetes.  We also discussed how sleep disruption can increase risks for accident, such as while driving.  Weight loss as a means of improving sleep apnea was also reviewed.  Additional treatment options discussed were CPAP therapy, oral appliance, and surgical intervention.  Will arrange for auto CPAP set up.

## 2013-05-14 ENCOUNTER — Telehealth: Payer: Self-pay | Admitting: Pulmonary Disease

## 2013-05-14 NOTE — Telephone Encounter (Signed)
Auto CPAP 04/15/13 to 04/28/13 >> used on 5 of 14 nights with average 2 hrs.  Average AHI 12.2 with median CPAP 5 cm H2O and 95 th percentile CPAP 6 cm H2O.  Will have my nurse inform pt that report shows continued elevation in sleep apnea number.  He needs to wear CPAP mask for entire time he is asleep to get maximal benefit.  Will discuss in more detail at next ROV on 06/10/13, and then determine if adjustments are needed to his CPAP set up or if he needs to try alternative therapy to CPAP.

## 2013-05-14 NOTE — Telephone Encounter (Signed)
Pt is aware of download report results.

## 2013-05-30 ENCOUNTER — Other Ambulatory Visit: Payer: Self-pay | Admitting: Dermatology

## 2013-06-10 ENCOUNTER — Encounter: Payer: Self-pay | Admitting: Pulmonary Disease

## 2013-06-10 ENCOUNTER — Ambulatory Visit (INDEPENDENT_AMBULATORY_CARE_PROVIDER_SITE_OTHER): Payer: Medicare HMO | Admitting: Pulmonary Disease

## 2013-06-10 VITALS — BP 114/60 | HR 62 | Temp 97.9°F | Ht 71.0 in | Wt 218.8 lb

## 2013-06-10 DIAGNOSIS — G4733 Obstructive sleep apnea (adult) (pediatric): Secondary | ICD-10-CM

## 2013-06-10 DIAGNOSIS — Z23 Encounter for immunization: Secondary | ICD-10-CM

## 2013-06-10 NOTE — Assessment & Plan Note (Signed)
He reports benefit from using CPAP, and has improved compliance with continued use.  Will continue auto CPAP set up.

## 2013-06-10 NOTE — Progress Notes (Signed)
Chief Complaint  Patient presents with  . Follow-up    Pt reports he tries to wear his CPAP mostly everynight x 6.5-7 hrs a night. Denies any problems w/ mask/pressure/machine. He does report he feels rested. He denies waking up any during the night.    History of Present Illness: Jared [name] is a 72 y.o. male with OSA.  He has been doing better with CPAP.  He uses this for about 5 to 6 hours per night >> he sleeps for 6 to 7 hours per night.  He has full face mask, and no problem with mask fit.  He is feeling more rested in the day >> no longer falling asleep while watching TV or reading.  TESTS: Home sleep study 03/04/13 >> AHI 14.5, SpO2 86% Auto CPAP 04/15/13 to 04/28/13 >> used on 5 of 14 nights with average 2 hrs.  Average AHI 12.2 with median CPAP 5 cm H2O and 95 th percentile CPAP 6 cm H2O.   Jared [name]  has a past medical history of ALLERGIC RHINITIS (10/05/2007); ANXIETY (10/05/2007); BENIGN PROSTATIC HYPERTROPHY (04/08/2009); COLONIC POLYPS, HX OF (10/05/2007); DEPRESSION (10/05/2007); GERD (10/05/2007); HYPERTENSION (10/05/2007); NEPHROLITHIASIS, HX OF (10/05/2007); SLEEP APNEA, OBSTRUCTIVE (10/02/2008); and Heart murmur.  Jared [name]  has past surgical history that includes Appendectomy; Cholecystectomy; Wrist fracture surgery; Foot surgery; Cataract extraction (2014); and Hernia repair (2014).  Prior to Admission medications   Medication Sig Start Date End Date Taking? Authorizing Provider  ALPRAZolam Prudy Feeler) 1 MG tablet Take 1 mg by mouth 2 (two) times daily.  10/17/11  Yes Historical Provider, MD  amLODipine (NORVASC) 10 MG tablet TAKE 1 TABLET BY MOUTH EVERY DAY 01/21/13  Yes Gordy Savers, MD  aspirin 81 MG tablet Take 81 mg by mouth daily.     Yes Historical Provider, MD  buPROPion (WELLBUTRIN SR) 150 MG 12 hr tablet TAKE 1 TABLET BY MOUTH TWICE A DAY 12/14/11  Yes Gordy Savers, MD  doxazosin (CARDURA) 8 MG tablet Take 1 tablet (8 mg total) by mouth at bedtime. 01/21/13   Yes Gordy Savers, MD  finasteride (PROSCAR) 5 MG tablet TAKE 1 TABLET (5 MG TOTAL) BY MOUTH DAILY. 11/30/12  Yes Gordy Savers, MD  losartan-hydrochlorothiazide [facility]) 100-25 MG per tablet TAKE 1 TABLET BY MOUTH EVERY DAY 12/25/12  Yes Gordy Savers, MD  omeprazole (PRILOSEC) 10 MG capsule TAKE ONE CAPSULE BY MOUTH EVERY DAY 08/25/12  Yes Gordy Savers, MD  sertraline (ZOLOFT) 50 MG tablet Take 1 tablet (50 mg total) by mouth daily. 12/09/10  Yes Gordy Savers, MD  timolol (TIMOPTIC) 0.5 % ophthalmic solution Place 1 drop into the right eye daily.  05/11/12  Yes Historical Provider, MD  traZODone (DESYREL) 50 MG tablet Take 50 mg by mouth daily.     Yes Historical Provider, MD    No Known Allergies   Physical Exam:  General - No distress ENT - No sinus tenderness, no oral exudate, no LAN, MP 3, scalloped tongue, mild overbite Cardiac - s1s2 regular, no murmur Chest - No wheeze/rales/dullness Back - No focal tenderness Abd - Soft, non-tender Ext - No edema Neuro - Normal strength Skin - No rashes Psych - normal mood, and behavior   Assessment/Plan:  Coralyn Helling, MD [place] [facility]/Critical [facility]/Sleep Pager:  [phone]

## 2013-06-10 NOTE — Patient Instructions (Signed)
Follow up in 1 year.

## 2013-06-12 ENCOUNTER — Ambulatory Visit (INDEPENDENT_AMBULATORY_CARE_PROVIDER_SITE_OTHER): Payer: Medicare HMO | Admitting: Internal Medicine

## 2013-06-12 ENCOUNTER — Encounter: Payer: Self-pay | Admitting: Internal Medicine

## 2013-06-12 VITALS — BP 120/76 | HR 75 | Temp 98.0°F | Resp 20 | Wt 219.0 lb

## 2013-06-12 DIAGNOSIS — F329 Major depressive disorder, single episode, unspecified: Secondary | ICD-10-CM

## 2013-06-12 DIAGNOSIS — I10 Essential (primary) hypertension: Secondary | ICD-10-CM

## 2013-06-12 DIAGNOSIS — Z23 Encounter for immunization: Secondary | ICD-10-CM

## 2013-06-12 DIAGNOSIS — F411 Generalized anxiety disorder: Secondary | ICD-10-CM

## 2013-06-12 DIAGNOSIS — G4733 Obstructive sleep apnea (adult) (pediatric): Secondary | ICD-10-CM

## 2013-06-12 DIAGNOSIS — F3289 Other specified depressive episodes: Secondary | ICD-10-CM

## 2013-06-12 NOTE — Patient Instructions (Addendum)
Limit your sodium (Salt) intake  cpx 6 months

## 2013-06-12 NOTE — Progress Notes (Signed)
Subjective:    Patient ID: Jared [name], male    DOB: 1940/11/17, 72 y.o.   MRN: [id number]  HPI   72 year old patient who is in today for his biannual followup. He is treated hypertension and a history of anxiety depression. He has been seen by pulmonary medicine recently dated 2 OSA. He seems to have done better on CPAP. His main complaint today is a 2-3 month history of urgency associated with some incontinence  Past Medical History  Diagnosis Date  . ALLERGIC RHINITIS 10/05/2007  . ANXIETY 10/05/2007  . BENIGN PROSTATIC HYPERTROPHY 04/08/2009  . COLONIC POLYPS, HX OF 10/05/2007  . DEPRESSION 10/05/2007  . GERD 10/05/2007  . HYPERTENSION 10/05/2007  . NEPHROLITHIASIS, HX OF 10/05/2007  . SLEEP APNEA, OBSTRUCTIVE 10/02/2008  . Heart murmur     History   Social History  . Marital Status: Widowed    Spouse Name: N/A    Number of Children: N/A  . Years of Education: N/A   Occupational History  . Not on file.   Social History Main Topics  . Smoking status: Never Smoker   . Smokeless tobacco: Never Used  . Alcohol Use: No  . Drug Use: No  . Sexual Activity: Not on file   Other Topics Concern  . Not on file   Social History Narrative  . No narrative on file    Past Surgical History  Procedure Laterality Date  . Appendectomy    . Cholecystectomy    . Wrist fracture surgery      right x2   left x1  . Foot surgery      left  . Cataract extraction  2014    bilateral  . Hernia repair  2014    Family History  Problem Relation Age of Onset  . Colon cancer Maternal Aunt 70    No Known Allergies  Current Outpatient Prescriptions on File Prior to Visit  Medication Sig Dispense Refill  . ALPRAZolam (XANAX) 1 MG tablet Take 1 mg by mouth 2 (two) times daily.       Marland Kitchen amLODipine (NORVASC) 10 MG tablet TAKE 1 TABLET BY MOUTH EVERY DAY  90 tablet  3  . aspirin 81 MG tablet Take 81 mg by mouth daily.        Marland Kitchen buPROPion (WELLBUTRIN SR) 150 MG 12 hr tablet TAKE 1 TABLET BY MOUTH  TWICE A DAY  180 tablet  3  . doxazosin (CARDURA) 8 MG tablet Take 1 tablet (8 mg total) by mouth at bedtime.  90 tablet  3  . finasteride (PROSCAR) 5 MG tablet TAKE 1 TABLET (5 MG TOTAL) BY MOUTH DAILY.  90 tablet  3  . losartan-hydrochlorothiazide (HYZAAR) 100-25 MG per tablet TAKE 1 TABLET BY MOUTH EVERY DAY  90 tablet  3  . omeprazole (PRILOSEC) 10 MG capsule TAKE ONE CAPSULE BY MOUTH EVERY DAY  90 capsule  3  . sertraline (ZOLOFT) 50 MG tablet Take 1 tablet (50 mg total) by mouth daily.  90 tablet  6  . timolol (TIMOPTIC) 0.5 % ophthalmic solution Place 1 drop into the right eye daily.       . traZODone (DESYREL) 50 MG tablet Take 50 mg by mouth daily.         No current facility-administered medications on file prior to visit.    BP 120/76  Pulse 75  Temp(Src) 98 F (36.7 C) (Oral)  Resp 20  Wt 219 lb (99.338 kg)  BMI 30.56 kg/m2  SpO2 96%       Review of Systems  Constitutional: Negative for fever, chills, appetite change and fatigue.  HENT: Negative for hearing loss, ear pain, congestion, sore throat, trouble swallowing, neck stiffness, dental problem, voice change and tinnitus.   Eyes: Negative for pain, discharge and visual disturbance.  Respiratory: Negative for cough, chest tightness, wheezing and stridor.   Cardiovascular: Negative for chest pain, palpitations and leg swelling.  Gastrointestinal: Negative for nausea, vomiting, abdominal pain, diarrhea, constipation, blood in stool and abdominal distention.  Genitourinary: Positive for urgency. Negative for hematuria, flank pain, discharge, difficulty urinating and genital sores.  Musculoskeletal: Negative for myalgias, back pain, joint swelling, arthralgias and gait problem.  Skin: Negative for rash.  Neurological: Negative for dizziness, syncope, speech difficulty, weakness, numbness and headaches.  Hematological: Negative for adenopathy. Does not bruise/bleed easily.  Psychiatric/Behavioral: Negative for  behavioral problems and dysphoric mood. The patient is nervous/anxious.        Objective:   Physical Exam  Constitutional: He is oriented to person, place, and time. He appears well-developed.  HENT:  Head: Normocephalic.  Right Ear: External ear normal.  Left Ear: External ear normal.  Eyes: Conjunctivae and EOM are normal.  Neck: Normal range of motion.  Cardiovascular: Normal rate and normal heart sounds.   Pulmonary/Chest: Breath sounds normal.  Abdominal: Bowel sounds are normal.  Musculoskeletal: Normal range of motion. He exhibits no edema and no tenderness.  Neurological: He is alert and oriented to person, place, and time.  Psychiatric: He has a normal mood and affect. His behavior is normal.          Assessment & Plan:   Hypertension. Well controlled History of anxiety depression stable Urinary urgency. We'll give a trial of OAB  Medicine  CPX 6 months

## 2013-06-25 ENCOUNTER — Telehealth: Payer: Self-pay | Admitting: Internal Medicine

## 2013-06-25 MED ORDER — MIRABEGRON ER 50 MG PO TB24: 50.0000 mg | ORAL_TABLET | Freq: Every day | ORAL | Status: DC

## 2013-06-25 NOTE — Telephone Encounter (Signed)
Pt given samples of mirabegron 50 mg (1 / day) and would like to get an rx for this med. Pharm: [facility]/ [place], [place]

## 2013-06-25 NOTE — Telephone Encounter (Signed)
Pt notified Rx sent to pharmacy

## 2013-06-26 ENCOUNTER — Telehealth: Payer: Self-pay | Admitting: Internal Medicine

## 2013-06-26 NOTE — Telephone Encounter (Signed)
Pt is inquiring about his mirabegron ER (MYRBETRIQ) 50 MG TB24 tablet RX. He states that it is extremely costly, and would like to have a comparable called into [facility] in [place]. Please assist.

## 2013-06-27 NOTE — Telephone Encounter (Signed)
Pls advise.  

## 2013-06-27 NOTE — Telephone Encounter (Signed)
No generic equipment Have patient try OTC Oxytrol patch

## 2013-06-28 NOTE — Telephone Encounter (Signed)
Called and spoke with pt and pt is aware.  

## 2013-08-03 ENCOUNTER — Other Ambulatory Visit: Payer: Self-pay | Admitting: Internal Medicine

## 2013-08-06 ENCOUNTER — Telehealth: Payer: Self-pay | Admitting: Internal Medicine

## 2013-08-06 DIAGNOSIS — C4491 Basal cell carcinoma of skin, unspecified: Secondary | ICD-10-CM

## 2013-08-06 NOTE — Telephone Encounter (Signed)
Pt needs a referral to see Dr Danella Deis for basal cell check up. Pt has [employer] ins.

## 2013-08-06 NOTE — Telephone Encounter (Signed)
Spoke to pt told him order done for Referral to Dermatology Dr. Danella Deis and someone will contact him. Pt verbalized understanding.

## 2013-08-20 ENCOUNTER — Telehealth: Payer: Self-pay | Admitting: Pulmonary Disease

## 2013-08-20 NOTE — Telephone Encounter (Signed)
Please advise [facility];s thanks 

## 2013-08-22 NOTE — Telephone Encounter (Signed)
Pt  Aware and has returned his cpap to [facility] spoke to [email or web address]  Tobe Sos

## 2013-08-27 ENCOUNTER — Encounter: Payer: Self-pay | Admitting: Internal Medicine

## 2013-08-27 ENCOUNTER — Ambulatory Visit (INDEPENDENT_AMBULATORY_CARE_PROVIDER_SITE_OTHER): Payer: Medicare HMO | Admitting: Internal Medicine

## 2013-08-27 VITALS — BP 140/90 | HR 74 | Temp 97.9°F | Resp 20 | Wt 223.0 lb

## 2013-08-27 DIAGNOSIS — K4090 Unilateral inguinal hernia, without obstruction or gangrene, not specified as recurrent: Secondary | ICD-10-CM

## 2013-08-27 DIAGNOSIS — G4733 Obstructive sleep apnea (adult) (pediatric): Secondary | ICD-10-CM

## 2013-08-27 DIAGNOSIS — F411 Generalized anxiety disorder: Secondary | ICD-10-CM

## 2013-08-27 DIAGNOSIS — I10 Essential (primary) hypertension: Secondary | ICD-10-CM

## 2013-08-27 DIAGNOSIS — Z0000 Encounter for general adult medical examination without abnormal findings: Secondary | ICD-10-CM

## 2013-08-27 MED ORDER — FLUTICASONE PROPIONATE 50 MCG/ACT NA SUSP: 2.0000 | Freq: Every day | NASAL | Status: DC

## 2013-08-27 NOTE — Progress Notes (Signed)
Subjective:    Patient ID: Jared [name], male    DOB: 13-Aug-1941, 72 y.o.   MRN: [id number]  HPI 72 year old patient who is in today for followup of hypertension. He has a history of anxiety depression but seems to doing reasonably well. He has a history of OSA. Doing reasonably well. Requesting a dermatologic referral  Past Medical History  Diagnosis Date  . ALLERGIC RHINITIS 10/05/2007  . ANXIETY 10/05/2007  . BENIGN PROSTATIC HYPERTROPHY 04/08/2009  . COLONIC POLYPS, HX OF 10/05/2007  . DEPRESSION 10/05/2007  . GERD 10/05/2007  . HYPERTENSION 10/05/2007  . NEPHROLITHIASIS, HX OF 10/05/2007  . SLEEP APNEA, OBSTRUCTIVE 10/02/2008  . Heart murmur     History   Social History  . Marital Status: Widowed    Spouse Name: N/A    Number of Children: N/A  . Years of Education: N/A   Occupational History  . Not on file.   Social History Main Topics  . Smoking status: Never Smoker   . Smokeless tobacco: Never Used  . Alcohol Use: No  . Drug Use: No  . Sexual Activity: Not on file   Other Topics Concern  . Not on file   Social History Narrative  . No narrative on file    Past Surgical History  Procedure Laterality Date  . Appendectomy    . Cholecystectomy    . Wrist fracture surgery      right x2   left x1  . Foot surgery      left  . Cataract extraction  2014    bilateral  . Hernia repair  2014    Family History  Problem Relation Age of Onset  . Colon cancer Maternal Aunt 70    No Known Allergies  Current Outpatient Prescriptions on File Prior to Visit  Medication Sig Dispense Refill  . ALPRAZolam (XANAX) 1 MG tablet Take 1 mg by mouth 2 (two) times daily.       Marland Kitchen amLODipine (NORVASC) 10 MG tablet TAKE 1 TABLET BY MOUTH EVERY DAY  90 tablet  3  . aspirin 81 MG tablet Take 81 mg by mouth daily.        Marland Kitchen buPROPion (WELLBUTRIN SR) 150 MG 12 hr tablet TAKE 1 TABLET BY MOUTH TWICE A DAY  180 tablet  3  . doxazosin (CARDURA) 8 MG tablet Take 1 tablet (8 mg total) by  mouth at bedtime.  90 tablet  3  . finasteride (PROSCAR) 5 MG tablet TAKE 1 TABLET (5 MG TOTAL) BY MOUTH DAILY.  90 tablet  3  . losartan-hydrochlorothiazide (HYZAAR) 100-25 MG per tablet TAKE 1 TABLET BY MOUTH EVERY DAY  90 tablet  3  . omeprazole (PRILOSEC) 10 MG capsule TAKE ONE CAPSULE BY MOUTH EVERY DAY  90 capsule  1  . sertraline (ZOLOFT) 50 MG tablet Take 1 tablet (50 mg total) by mouth daily.  90 tablet  6  . timolol (TIMOPTIC) 0.5 % ophthalmic solution Place 1 drop into the right eye daily.       . traZODone (DESYREL) 50 MG tablet Take 50 mg by mouth daily.         No current facility-administered medications on file prior to visit.    BP 140/90  Pulse 74  Temp(Src) 97.9 F (36.6 C) (Oral)  Resp 20  Wt 223 lb (101.152 kg)  SpO2 97%         Review of Systems  Constitutional: Negative for fever, chills, appetite change and fatigue.  HENT: Negative for congestion, dental problem, ear pain, hearing loss, sore throat, tinnitus, trouble swallowing and voice change.   Eyes: Negative for pain, discharge and visual disturbance.  Respiratory: Negative for cough, chest tightness, wheezing and stridor.   Cardiovascular: Negative for chest pain, palpitations and leg swelling.  Gastrointestinal: Negative for nausea, vomiting, abdominal pain, diarrhea, constipation, blood in stool and abdominal distention.  Genitourinary: Negative for urgency, hematuria, flank pain, discharge, difficulty urinating and genital sores.  Musculoskeletal: Negative for arthralgias, back pain, gait problem, joint swelling, myalgias and neck stiffness.  Skin: Positive for rash.  Neurological: Negative for dizziness, syncope, speech difficulty, weakness, numbness and headaches.  Hematological: Negative for adenopathy. Does not bruise/bleed easily.  Psychiatric/Behavioral: Negative for behavioral problems and dysphoric mood. The patient is nervous/anxious.        Objective:   Physical Exam  Constitutional:  He is oriented to person, place, and time. He appears well-developed.  Repeat blood pressure 130/80  HENT:  Head: Normocephalic.  Right Ear: External ear normal.  Left Ear: External ear normal.  Eyes: Conjunctivae and EOM are normal.  Neck: Normal range of motion.  Cardiovascular: Normal rate and normal heart sounds.   Pulmonary/Chest: Breath sounds normal.  Abdominal: Bowel sounds are normal.  Musculoskeletal: Normal range of motion. He exhibits no edema and no tenderness.  Neurological: He is alert and oriented to person, place, and time.  Psychiatric: He has a normal mood and affect. His behavior is normal.          Assessment & Plan:  Hypertension stable Anxiety depression  No change in medication Dermatology referral as requested

## 2013-08-27 NOTE — Progress Notes (Signed)
Pre-visit discussion using our clinic review tool. No additional management support is needed unless otherwise documented below in the visit note.  

## 2013-08-27 NOTE — Patient Instructions (Signed)
Limit your sodium (Salt) intake    It is important that you exercise regularly, at least 20 minutes 3 to 4 times per week.  If you develop chest pain or shortness of breath seek  medical attention.  Return in 6 months for follow-up  

## 2013-12-05 ENCOUNTER — Other Ambulatory Visit (INDEPENDENT_AMBULATORY_CARE_PROVIDER_SITE_OTHER): Payer: Medicare HMO

## 2013-12-05 DIAGNOSIS — Z0000 Encounter for general adult medical examination without abnormal findings: Secondary | ICD-10-CM

## 2013-12-05 LAB — CBC WITH DIFFERENTIAL/PLATELET
Basophils Absolute: 0 10*3/uL (ref 0.0–0.1)
Basophils Relative: 0.6 % (ref 0.0–3.0)
EOS PCT: 4.7 % (ref 0.0–5.0)
Eosinophils Absolute: 0.4 10*3/uL (ref 0.0–0.7)
HCT: 43.2 % (ref 39.0–52.0)
HEMOGLOBIN: 14.7 g/dL (ref 13.0–17.0)
LYMPHS ABS: 2.7 10*3/uL (ref 0.7–4.0)
LYMPHS PCT: 32.8 % (ref 12.0–46.0)
MCHC: 34 g/dL (ref 30.0–36.0)
MCV: 89.5 fl (ref 78.0–100.0)
MONOS PCT: 10.1 % (ref 3.0–12.0)
Monocytes Absolute: 0.8 10*3/uL (ref 0.1–1.0)
Neutro Abs: 4.2 10*3/uL (ref 1.4–7.7)
Neutrophils Relative %: 51.8 % (ref 43.0–77.0)
Platelets: 249 10*3/uL (ref 150.0–400.0)
RBC: 4.82 Mil/uL (ref 4.22–5.81)
RDW: 13.4 % (ref 11.5–14.6)
WBC: 8.1 10*3/uL (ref 4.5–10.5)

## 2013-12-05 LAB — LIPID PANEL
CHOL/HDL RATIO: 5
CHOLESTEROL: 200 mg/dL (ref 0–200)
HDL: 39.6 mg/dL (ref >39.00)
LDL Cholesterol: 134 mg/dL — ABNORMAL HIGH (ref 0–99)
TRIGLYCERIDES: 130 mg/dL (ref 0.0–149.0)
VLDL: 26 mg/dL (ref 0.0–40.0)

## 2013-12-05 LAB — POCT URINALYSIS DIPSTICK
Bilirubin, UA: NEGATIVE
GLUCOSE UA: NEGATIVE
Ketones, UA: NEGATIVE
Nitrite, UA: NEGATIVE
SPEC GRAV UA: 1.025
UROBILINOGEN UA: 0.2
pH, UA: 6

## 2013-12-05 LAB — BASIC METABOLIC PANEL
BUN: 19 mg/dL (ref 6–23)
CALCIUM: 9.6 mg/dL (ref 8.4–10.5)
CO2: 30 mEq/L (ref 19–32)
Chloride: 103 mEq/L (ref 96–112)
Creatinine, Ser: 1.2 mg/dL (ref 0.4–1.5)
GFR: 63.42 mL/min (ref >60.00)
Glucose, Bld: 102 mg/dL — ABNORMAL HIGH (ref 70–99)
POTASSIUM: 4.1 meq/L (ref 3.5–5.1)
SODIUM: 142 meq/L (ref 135–145)

## 2013-12-05 LAB — TSH: TSH: 3.65 u[IU]/mL (ref 0.35–5.50)

## 2013-12-05 LAB — HEPATIC FUNCTION PANEL
ALBUMIN: 4 g/dL (ref 3.5–5.2)
ALT: 27 U/L (ref 0–53)
AST: 22 U/L (ref 0–37)
Alkaline Phosphatase: 76 U/L (ref 39–117)
BILIRUBIN TOTAL: 0.6 mg/dL (ref 0.3–1.2)
Bilirubin, Direct: 0.1 mg/dL (ref 0.0–0.3)
Total Protein: 7.3 g/dL (ref 6.0–8.3)

## 2013-12-05 LAB — PSA: PSA: 3.03 ng/mL (ref 0.10–4.00)

## 2013-12-06 ENCOUNTER — Other Ambulatory Visit: Payer: Self-pay | Admitting: Internal Medicine

## 2013-12-11 ENCOUNTER — Other Ambulatory Visit: Payer: Self-pay | Admitting: Internal Medicine

## 2013-12-11 ENCOUNTER — Encounter: Payer: Medicare HMO | Admitting: Internal Medicine

## 2013-12-24 ENCOUNTER — Other Ambulatory Visit: Payer: Self-pay | Admitting: Internal Medicine

## 2014-01-27 ENCOUNTER — Other Ambulatory Visit: Payer: Self-pay | Admitting: Internal Medicine

## 2014-01-29 ENCOUNTER — Ambulatory Visit (INDEPENDENT_AMBULATORY_CARE_PROVIDER_SITE_OTHER): Payer: Commercial Managed Care - HMO | Admitting: Internal Medicine

## 2014-01-29 ENCOUNTER — Telehealth: Payer: Self-pay | Admitting: Internal Medicine

## 2014-01-29 ENCOUNTER — Encounter: Payer: Self-pay | Admitting: Internal Medicine

## 2014-01-29 VITALS — BP 110/80 | HR 73 | Temp 98.2°F | Resp 20 | Ht 71.0 in | Wt 216.0 lb

## 2014-01-29 DIAGNOSIS — J309 Allergic rhinitis, unspecified: Secondary | ICD-10-CM

## 2014-01-29 DIAGNOSIS — I10 Essential (primary) hypertension: Secondary | ICD-10-CM

## 2014-01-29 DIAGNOSIS — G4733 Obstructive sleep apnea (adult) (pediatric): Secondary | ICD-10-CM

## 2014-01-29 DIAGNOSIS — Z0000 Encounter for general adult medical examination without abnormal findings: Secondary | ICD-10-CM

## 2014-01-29 DIAGNOSIS — Z86010 Personal history of colonic polyps: Secondary | ICD-10-CM

## 2014-01-29 NOTE — Patient Instructions (Signed)
Limit your sodium (Salt) intake    It is important that you exercise regularly, at least 20 minutes 3 to 4 times per week.  If you develop chest pain or shortness of breath seek  medical attention.  Please check your blood pressure on a regular basis.  If it is consistently greater than 150/90, please make an office appointment.  Return in 6 months for follow-up   

## 2014-01-29 NOTE — Progress Notes (Signed)
Pre-visit discussion using our clinic review tool. No additional management support is needed unless otherwise documented below in the visit note.  

## 2014-01-29 NOTE — Progress Notes (Signed)
   Subjective:    Patient ID: Jared [name], male    DOB: [birth year], 73 y.o.   MRN: [id number]  HPI  Wt Readings from Last 3 Encounters:  01/29/14 216 lb (97.977 kg)  08/27/13 223 lb (101.152 kg)  06/12/13 219 lb (99.338 kg)    Review of Systems     Objective:   Physical Exam        Assessment & Plan:

## 2014-01-29 NOTE — Telephone Encounter (Signed)
Relevant patient education mailed to patient.  

## 2014-01-29 NOTE — Progress Notes (Signed)
Patient ID: Jared [name], male   DOB: 11-22-1940, 73 y.o.   MRN: [id number]  Subjective:    Patient ID: Jared [name], male    DOB: 1941-01-14, 73 y.o.   MRN: [id number]  HPI 73year-old patient who is seen today for a preventive health examination.   Medical problems include anxiety depression. He has been under considerable situational stress due to some personal issues with his daughter. He has treated hypertension and allergic rhinitis. He has a history of BPH.  1. Risk factors, based on past  M,S,F history-   Cardiovascular risk factors include hypertension  2.  Physical activities: no activity restrictions does go to the health club infrequently.  3.  Depression/mood: positive for depression presently is on medication there have been some recent situational stressors; he is interested in tapering and possible discontinuation  4.  Hearing: no deficits  5.  ADL's: independent in all aspects of daily living  6.  Fall risk:  low  7.  Home safety: no problems identified  8.  Height weight, and visual acuity; height and weight are stable no difficulty with visual acuity  9.  Counseling: more regular exercise encouraged heart healthy diet restricted salt  10. Lab orders based on risk factors: all discussed laboratory studies were reviewed. PSA was slightly elevated we'll recheck in 4-6 months  11. Referral : not appropriate at this time. Will need followup colonoscopy in one year  12. Care plan: heart healthy diet regular exercise all encouraged  13. Cognitive assessment:  Alert and appropriate with normal affect. No cognitive dysfunction.       Preventive Screening-Counseling & Management  Alcohol-Tobacco  Smoking Status: never   Allergies:  No Known Drug Allergies   Past History:  Past Medical History:   Allergic rhinitis  Anxiety  Colonic polyps, hx of  Depression  GERD  Hypertension  Nephrolithiasis, hx of  Benign prostatic hypertrophy   Past Surgical History:    Right inguinal hernia repair 2014  Appendectomy  Cholecystectomy  history of fracture, right wrist x 2  history of fracture, left wrist  status post surgery, left foot  colonoscopy 2008    Family History:   father died age 2, MI, history hypertension  mother died age 40 MI  No siblings   Social History:   Divorced  relocated from [place] to be with grandchildren  former Nurse, 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  HENT: Negative for congestion, dental problem, ear pain, hearing loss, mouth sores, rhinorrhea, sinus pressure, sneezing, tinnitus, trouble swallowing and voice change.   Eyes: Negative for photophobia, pain, redness and visual disturbance.  Respiratory: Positive for cough. Negative for apnea, choking, chest tightness, shortness of breath and wheezing.   Cardiovascular: Negative for chest pain, palpitations and leg swelling.  Gastrointestinal: Negative for nausea, vomiting, abdominal pain, diarrhea, constipation, blood in stool, abdominal distention, anal bleeding and rectal pain.  Genitourinary: Negative for dysuria, urgency, frequency, hematuria, flank pain, decreased urine volume, discharge, penile swelling, scrotal swelling, difficulty urinating, genital sores and testicular pain.  Musculoskeletal: Negative for arthralgias, back pain, gait problem, joint swelling, myalgias, neck pain and neck stiffness.  Skin: Negative for color change, rash and wound.  Neurological: Negative for dizziness, tremors, seizures, syncope, facial asymmetry, speech difficulty, weakness, light-headedness, numbness and headaches.  Hematological: Negative for adenopathy. Does not bruise/bleed easily.  Psychiatric/Behavioral: Negative  for suicidal ideas, hallucinations, behavioral problems, confusion, sleep disturbance, self-injury,  dysphoric mood, decreased concentration and agitation. The patient is not nervous/anxious.        Objective:   Physical Exam  Constitutional: He appears well-developed and well-nourished.  HENT:  Head: Normocephalic and atraumatic.  Right Ear: External ear normal.  Left Ear: External ear normal.  Nose: Nose normal.  Mouth/Throat: Oropharynx is clear and moist.  Eyes: Conjunctivae and EOM are normal. Pupils are equal, round, and reactive to light. No scleral icterus.  Neck: Normal range of motion. Neck supple. No JVD present. No thyromegaly present.  Cystic mass anterior mid neck area, which has been chronic  Cardiovascular: Regular rhythm, normal heart sounds and intact distal pulses.  Exam reveals no gallop and no friction rub.   No murmur heard. Pulmonary/Chest: Effort normal and breath sounds normal. He exhibits no tenderness.  Abdominal: Soft. Bowel sounds are normal. He exhibits no distension and no mass. There is no tenderness.  Surgical scars noted  Genitourinary: Prostate normal and penis normal. Guaiac negative stool.  Prostate slightly enlarged in symmetrical  Musculoskeletal: Normal range of motion. He exhibits no edema and no tenderness.  Lymphadenopathy:    He has no cervical adenopathy.  Neurological: He is alert. He has normal reflexes. No cranial nerve deficit. Coordination normal.  Skin: Skin is warm and dry. No rash noted.  Psychiatric: He has a normal mood and affect. His behavior is normal.          Assessment & Plan:  History colonic polyps. We'll schedule for followup colonoscopy. Anxiety depression we'll taper and discontinue Wellbutrin. Hypertension stable Preventive health examination. Laboratory studies reviewed  Recheck 3 months

## 2014-01-31 ENCOUNTER — Other Ambulatory Visit: Payer: Self-pay | Admitting: Internal Medicine

## 2014-01-31 ENCOUNTER — Encounter: Payer: Medicare HMO | Admitting: Internal Medicine

## 2014-01-31 ENCOUNTER — Telehealth: Payer: Self-pay | Admitting: Internal Medicine

## 2014-01-31 NOTE — Telephone Encounter (Signed)
Pt has appt today 5-15 with dr Allyson Sabal for skin check. Pt has [employer] insurance. Pt needs ins referral

## 2014-01-31 NOTE — Telephone Encounter (Signed)
Submitted a a referral to [facility] care management  for pt to see PT WAS SEEN 01-31-2014   Dr. Albertina Parr A. Aldona Lento, MD   [facility]  Address: [address] [place], [place] [address]  [phone] Awaitng authorization

## 2014-02-23 ENCOUNTER — Emergency Department (HOSPITAL_COMMUNITY): Payer: Medicare HMO

## 2014-02-23 ENCOUNTER — Emergency Department (HOSPITAL_COMMUNITY)
Admission: EM | Admit: 2014-02-23 | Discharge: 2014-02-24 | Disposition: A | Discharge status: Home / Self Care | Payer: Medicare HMO | Attending: Emergency Medicine | Admitting: Emergency Medicine

## 2014-02-23 ENCOUNTER — Encounter (HOSPITAL_COMMUNITY): Payer: Self-pay | Admitting: Emergency Medicine

## 2014-02-23 DIAGNOSIS — Z860100 Personal history of colon polyps, unspecified: Secondary | ICD-10-CM | POA: Insufficient documentation

## 2014-02-23 DIAGNOSIS — Z8781 Personal history of (healed) traumatic fracture: Secondary | ICD-10-CM | POA: Insufficient documentation

## 2014-02-23 DIAGNOSIS — N400 Enlarged prostate without lower urinary tract symptoms: Secondary | ICD-10-CM | POA: Insufficient documentation

## 2014-02-23 DIAGNOSIS — R011 Cardiac murmur, unspecified: Secondary | ICD-10-CM | POA: Insufficient documentation

## 2014-02-23 DIAGNOSIS — G4733 Obstructive sleep apnea (adult) (pediatric): Secondary | ICD-10-CM | POA: Insufficient documentation

## 2014-02-23 DIAGNOSIS — I10 Essential (primary) hypertension: Secondary | ICD-10-CM | POA: Insufficient documentation

## 2014-02-23 DIAGNOSIS — K219 Gastro-esophageal reflux disease without esophagitis: Secondary | ICD-10-CM | POA: Insufficient documentation

## 2014-02-23 DIAGNOSIS — R091 Pleurisy: Secondary | ICD-10-CM

## 2014-02-23 DIAGNOSIS — Z7982 Long term (current) use of aspirin: Secondary | ICD-10-CM | POA: Insufficient documentation

## 2014-02-23 DIAGNOSIS — IMO0002 Reserved for concepts with insufficient information to code with codable children: Secondary | ICD-10-CM | POA: Insufficient documentation

## 2014-02-23 DIAGNOSIS — F411 Generalized anxiety disorder: Secondary | ICD-10-CM | POA: Insufficient documentation

## 2014-02-23 DIAGNOSIS — F3289 Other specified depressive episodes: Secondary | ICD-10-CM | POA: Insufficient documentation

## 2014-02-23 DIAGNOSIS — Z87442 Personal history of urinary calculi: Secondary | ICD-10-CM | POA: Insufficient documentation

## 2014-02-23 DIAGNOSIS — Z79899 Other long term (current) drug therapy: Secondary | ICD-10-CM | POA: Insufficient documentation

## 2014-02-23 DIAGNOSIS — F329 Major depressive disorder, single episode, unspecified: Secondary | ICD-10-CM | POA: Insufficient documentation

## 2014-02-23 LAB — CBC
HCT: 43.6 % (ref 39.0–52.0)
Hemoglobin: 15 g/dL (ref 13.0–17.0)
MCH: 30.4 pg (ref 26.0–34.0)
MCHC: 34.4 g/dL (ref 30.0–36.0)
MCV: 88.4 fL (ref 78.0–100.0)
PLATELETS: 191 10*3/uL (ref 150–400)
RBC: 4.93 MIL/uL (ref 4.22–5.81)
RDW: 12.6 % (ref 11.5–15.5)
WBC: 5.3 10*3/uL (ref 4.0–10.5)

## 2014-02-23 LAB — BASIC METABOLIC PANEL
BUN: 25 mg/dL — ABNORMAL HIGH (ref 6–23)
CHLORIDE: 102 meq/L (ref 96–112)
CO2: 28 mEq/L (ref 19–32)
CREATININE: 1.19 mg/dL (ref 0.50–1.35)
Calcium: 9.8 mg/dL (ref 8.4–10.5)
GFR calc Af Amer: 69 mL/min — ABNORMAL LOW (ref >90)
GFR calc non Af Amer: 60 mL/min — ABNORMAL LOW (ref >90)
Glucose, Bld: 97 mg/dL (ref 70–99)
Potassium: 4.1 mEq/L (ref 3.7–5.3)
SODIUM: 142 meq/L (ref 137–147)

## 2014-02-23 LAB — TROPONIN I: Troponin I: <0.3 ng/mL (ref <0.30)

## 2014-02-23 LAB — I-STAT TROPONIN, ED: TROPONIN I, POC: 0 ng/mL (ref 0.00–0.08)

## 2014-02-23 MED ORDER — HYDROCODONE-ACETAMINOPHEN 5-325 MG PO TABS
2.0000 | ORAL_TABLET | Freq: Once | ORAL | Status: AC
  Administered 2014-02-23: 2 via ORAL
  Filled 2014-02-23: qty 2

## 2014-02-23 NOTE — ED Notes (Signed)
When this RN first placed the pt on the heart monitor, his heart rate was 42, and he was in trigeminy. His heart rate then sped up to the mid 60's, still in trigeminy. After this RN took another EKG at 2049, the pt went into bigeminy, and then his rate dropped to the 40's, and his rhythm changed to sinus. Marlon Pel, PA-C is aware.

## 2014-02-23 NOTE — ED Notes (Signed)
Marlon Pel, PA-C at bedside.

## 2014-02-23 NOTE — ED Provider Notes (Signed)
CSN: [id number]     Arrival date & time 02/23/14  1923 History   First MD Initiated Contact with Patient 02/23/14 2013     Chief Complaint  Patient presents with  . Chest Pain     (Consider location/radiation/quality/duration/timing/severity/associated sxs/prior Treatment) HPI Comments: Patient presents to the emergency department with a chief complaint of sharp left-sided chest pain times one month. He states it is worsened with deep breathing, and with coughing. States that yesterday he was out mowing the lawn, when the chest pain became severe, and he had to stop. He called EMS, but never came to the hospital. States that today he is noticed the chest pain has been gradually worsening again. However, he denies any shortness of breath, diaphoresis, nausea, or vomiting today. Patient states that he would like to just get checked out. He denies any falls or mechanism of injury. He has a history of hypertension, but does not have diabetes, high cholesterol, and does not smoke.  The history is provided by the patient. No language interpreter was used.    Past Medical History  Diagnosis Date  . ALLERGIC RHINITIS 10/05/2007  . ANXIETY 10/05/2007  . BENIGN PROSTATIC HYPERTROPHY 04/08/2009  . COLONIC POLYPS, HX OF 10/05/2007  . DEPRESSION 10/05/2007  . GERD 10/05/2007  . HYPERTENSION 10/05/2007  . NEPHROLITHIASIS, HX OF 10/05/2007  . SLEEP APNEA, OBSTRUCTIVE 10/02/2008  . Heart murmur    Past Surgical History  Procedure Laterality Date  . Appendectomy    . Cholecystectomy    . Wrist fracture surgery      right x2   left x1  . Foot surgery      left  . Cataract extraction  2014    bilateral  . Hernia repair  2014   Family History  Problem Relation Age of Onset  . Colon cancer Maternal Aunt 70   History  Substance Use Topics  . Smoking status: Never Smoker   . Smokeless tobacco: Never Used  . Alcohol Use: No    Review of Systems  Constitutional: Negative for fever and chills.   Respiratory: Negative for shortness of breath.   Cardiovascular: Positive for chest pain.  Gastrointestinal: Negative for nausea, vomiting, diarrhea and constipation.  Genitourinary: Negative for dysuria.      Allergies  Review of patient's allergies indicates no known allergies.  Home Medications   Prior to Admission medications   Medication Sig Start Date End Date Taking? Authorizing Provider  ALPRAZolam Duanne Moron) 1 MG tablet Take 1 mg by mouth 2 (two) times daily.  10/17/11   Historical Provider, MD  amLODipine (NORVASC) 10 MG tablet TAKE 1 TABLET BY MOUTH EVERY DAY    Marletta Lor, MD  aspirin 81 MG tablet Take 81 mg by mouth daily.      Historical Provider, MD  buPROPion [facility] SR) 150 MG 12 hr tablet TAKE 1 TABLET BY MOUTH TWICE A DAY 12/14/11   Marletta Lor, MD  doxazosin (CARDURA) 8 MG tablet TAKE 1 TABLET (8 MG TOTAL) BY MOUTH AT BEDTIME. 01/31/14   Marletta Lor, MD  finasteride (PROSCAR) 5 MG tablet TAKE 1 TABLET (5 MG TOTAL) BY MOUTH DAILY. 12/06/13   Marletta Lor, MD  fluticasone [facility]) 50 MCG/ACT nasal spray Place 2 sprays into both nostrils daily. 08/27/13   Marletta Lor, MD  losartan-hydrochlorothiazide [facility]) 100-25 MG per tablet TAKE 1 TABLET BY MOUTH EVERY DAY 12/24/13   Marletta Lor, MD  omeprazole (PRILOSEC) 10 MG capsule TAKE  ONE CAPSULE BY MOUTH EVERY DAY 01/27/14   Marletta Lor, MD  sertraline (ZOLOFT) 50 MG tablet Take 1 tablet (50 mg total) by mouth daily. 12/09/10   Marletta Lor, MD  timolol (TIMOPTIC) 0.5 % ophthalmic solution Place 1 drop into the right eye daily.  05/11/12   Historical Provider, MD  traZODone (DESYREL) 50 MG tablet Take 50 mg by mouth daily.      Historical Provider, MD   BP 145/82  Pulse 51  Temp(Src) 98 F (36.7 C) (Oral)  Resp 17  Ht 5\' 11"  (1.803 m)  Wt 215 lb (97.523 kg)  BMI 30.00 kg/m2  SpO2 96% Physical Exam  Nursing note and vitals reviewed. Constitutional: He is  oriented to person, place, and time. He appears well-developed and well-nourished.  HENT:  Head: Normocephalic and atraumatic.  Eyes: Conjunctivae and EOM are normal. Pupils are equal, round, and reactive to light. Right eye exhibits no discharge. Left eye exhibits no discharge. No scleral icterus.  Neck: Normal range of motion. Neck supple. No JVD present.  Cardiovascular: Normal rate, regular rhythm and normal heart sounds.  Exam reveals no gallop and no friction rub.   No murmur heard. Pulmonary/Chest: Effort normal and breath sounds normal. No respiratory distress. He has no wheezes. He has no rales. He exhibits no tenderness.  Abdominal: Soft. He exhibits no distension and no mass. There is no tenderness. There is no rebound and no guarding.  Musculoskeletal: Normal range of motion. He exhibits no edema and no tenderness.  Neurological: He is alert and oriented to person, place, and time.  Skin: Skin is warm and dry.  Psychiatric: He has a normal mood and affect. His behavior is normal. Judgment and thought content normal.    ED Course  Procedures (including critical care time) Results for orders placed during the hospital encounter of 02/23/14  CBC      Result Value Ref Range   WBC 5.3  4.0 - 10.5 K/uL   RBC 4.93  4.22 - 5.81 MIL/uL   Hemoglobin 15.0  13.0 - 17.0 g/dL   HCT 43.6  39.0 - 52.0 %   MCV 88.4  78.0 - 100.0 fL   MCH 30.4  26.0 - 34.0 pg   MCHC 34.4  30.0 - 36.0 g/dL   RDW 12.6  11.5 - 15.5 %   Platelets 191  150 - 400 K/uL  BASIC METABOLIC PANEL      Result Value Ref Range   Sodium 142  137 - 147 mEq/L   Potassium 4.1  3.7 - 5.3 mEq/L   Chloride 102  96 - 112 mEq/L   CO2 28  19 - 32 mEq/L   Glucose, Bld 97  70 - 99 mg/dL   BUN 25 (*) 6 - 23 mg/dL   Creatinine, Ser 1.19  0.50 - 1.35 mg/dL   Calcium 9.8  8.4 - 10.5 mg/dL   GFR calc non Af Amer 60 (*) >90 mL/min   GFR calc Af Amer 69 (*) >90 mL/min  TROPONIN I      Result Value Ref Range   Troponin I <0.30   <0.30 ng/mL  I-STAT TROPOININ, ED      Result Value Ref Range   Troponin i, poc 0.00  0.00 - 0.08 ng/mL   Comment 3            Dg Chest 2 View  02/23/2014   CLINICAL DATA:  Left flank pain.  EXAM: CHEST  2 VIEW  COMPARISON:  None.  FINDINGS: The patient has very small bilateral pleural effusions. The lungs appear clear. There is no pulmonary edema. Heart size is normal.  IMPRESSION: Small bilateral pleural effusions.  Otherwise negative.   Electronically Signed   By: Inge Rise M.D.   On: 02/23/2014 20:41     Imaging Review Dg Chest 2 View  02/23/2014   CLINICAL DATA:  Left flank pain.  EXAM: CHEST  2 VIEW  COMPARISON:  None.  FINDINGS: The patient has very small bilateral pleural effusions. The lungs appear clear. There is no pulmonary edema. Heart size is normal.  IMPRESSION: Small bilateral pleural effusions.  Otherwise negative.   Electronically Signed   By: Inge Rise M.D.   On: 02/23/2014 20:41     EKG Interpretation None      MDM   Final diagnoses:  Pleurisy    Patient with chest pain. It is worsened with exertion, and also with deep breathing. Small bilateral pleural effusions are seen on chest x-ray. Troponin and labs are reassuring. EKG is as above.  11:09 PM Patient seen by and discussed with Dr. Jeneen Rinks.  Will order delta troponin.  Suspect pleurisy.  Will treat with some pain meds and discharge to home pending negative delta troponin.  Montine Circle, PA-C 02/24/14 [phone]

## 2014-02-23 NOTE — ED Notes (Signed)
Pt reports left sided "sharp" chest pain x 1 month, worse with a cough (reports chronic NP cough x years). Pt denies SOB, diaphoresis, N/V. NAD. AO x4. States "the pain has just gotten worse over the last three days and I wanted to get it checked out."

## 2014-02-24 MED ORDER — HYDROCODONE-ACETAMINOPHEN 5-325 MG PO TABS: 1.0000 | ORAL_TABLET | Freq: Four times a day (QID) | ORAL | Status: DC | PRN

## 2014-02-24 NOTE — Discharge Instructions (Signed)
Incidentally, it was discovered that you have a very small pleural effusion.  There is nothing to do about this now, but you will need to follow-up with your Primary Care Provider in 2 weeks for a repeat chest x-ray.  Pleurisy Pleurisy is an inflammation and swelling of the lining of the lungs (pleura). Because of this inflammation, it hurts to breathe. It can be aggravated by coughing, laughing, or deep breathing. Pleurisy is often caused by an underlying infection or disease.  HOME CARE INSTRUCTIONS  Monitor your pleurisy for any changes. The following actions may help to alleviate any discomfort you are experiencing:  Medicine may help with pain. Only take over-the-counter or prescription medicines for pain, discomfort, or fever as directed by your health care provider.  Only take antibiotic medicine as directed. Make sure to finish it even if you start to feel better. SEEK MEDICAL CARE IF:   Your pain is not controlled with medicine or is increasing.  You have an increase in pus-like (purulent) secretions brought up with coughing. SEEK IMMEDIATE MEDICAL CARE IF:   You have blue or dark lips, fingernails, or toenails.  You are coughing up blood.  You have increased difficulty breathing.  You have continuing pain unrelieved by medicine or pain lasting more than 1 week.  You have pain that radiates into your neck, arms, or jaw.  You develop increased shortness of breath or wheezing.  You develop a fever, rash, vomiting, fainting, or other serious symptoms. MAKE SURE YOU:  Understand these instructions.   Will watch your condition.   Will get help right away if you are not doing well or get worse.  Document Released: 09/05/2005 Document Revised: 05/08/2013 Document Reviewed: 02/17/2013 [facility] Patient Information 2014 [place].

## 2014-03-04 ENCOUNTER — Ambulatory Visit (INDEPENDENT_AMBULATORY_CARE_PROVIDER_SITE_OTHER): Payer: Commercial Managed Care - HMO | Admitting: Internal Medicine

## 2014-03-04 ENCOUNTER — Encounter: Payer: Self-pay | Admitting: Internal Medicine

## 2014-03-04 VITALS — BP 100/60 | HR 79 | Temp 98.5°F | Resp 20 | Ht 71.0 in | Wt 213.0 lb

## 2014-03-04 DIAGNOSIS — J309 Allergic rhinitis, unspecified: Secondary | ICD-10-CM

## 2014-03-04 DIAGNOSIS — F329 Major depressive disorder, single episode, unspecified: Secondary | ICD-10-CM

## 2014-03-04 DIAGNOSIS — F3289 Other specified depressive episodes: Secondary | ICD-10-CM

## 2014-03-04 DIAGNOSIS — I10 Essential (primary) hypertension: Secondary | ICD-10-CM

## 2014-03-04 MED ORDER — MELOXICAM 15 MG PO TABS: 15.0000 mg | ORAL_TABLET | Freq: Every day | ORAL | Status: DC

## 2014-03-04 NOTE — Progress Notes (Signed)
Pre-visit discussion using our clinic review tool. No additional management support is needed unless otherwise documented below in the visit note.  

## 2014-03-04 NOTE — Progress Notes (Signed)
Subjective:    Patient ID: Jared [name], male    DOB: 1941-04-26, 73 y.o.   MRN: [id number]  HPI  73 year old patient who has treated hypertension.  About one week ago.  He presented to the ED with left sided chest wall pain.  Chest x-ray was taken.  He is much improved, but continues to have some mild left anterolateral chest wall pain with movement and activities.  No shortness of breath  Past Medical History  Diagnosis Date  . ALLERGIC RHINITIS 10/05/2007  . ANXIETY 10/05/2007  . BENIGN PROSTATIC HYPERTROPHY 04/08/2009  . COLONIC POLYPS, HX OF 10/05/2007  . DEPRESSION 10/05/2007  . GERD 10/05/2007  . HYPERTENSION 10/05/2007  . NEPHROLITHIASIS, HX OF 10/05/2007  . SLEEP APNEA, OBSTRUCTIVE 10/02/2008  . Heart murmur     History   Social History  . Marital Status: Widowed    Spouse Name: N/A    Number of Children: N/A  . Years of Education: N/A   Occupational History  . Not on file.   Social History Main Topics  . Smoking status: Never Smoker   . Smokeless tobacco: Never Used  . Alcohol Use: No  . Drug Use: No  . Sexual Activity: Not on file   Other Topics Concern  . Not on file   Social History Narrative  . No narrative on file    Past Surgical History  Procedure Laterality Date  . Appendectomy    . Cholecystectomy    . Wrist fracture surgery      right x2   left x1  . Foot surgery      left  . Cataract extraction  2014    bilateral  . Hernia repair  2014    Family History  Problem Relation Age of Onset  . Colon cancer Maternal Aunt 70    No Known Allergies  Current Outpatient Prescriptions on File Prior to Visit  Medication Sig Dispense Refill  . ALPRAZolam (XANAX) 1 MG tablet Take 1 mg by mouth 2 (two) times daily.       Marland Kitchen amLODipine (NORVASC) 10 MG tablet Take 10 mg by mouth daily.      Marland Kitchen aspirin 81 MG tablet Take 81 mg by mouth daily.        Marland Kitchen buPROPion (WELLBUTRIN SR) 150 MG 12 hr tablet Take 150 mg by mouth 2 (two) times daily.      Marland Kitchen doxazosin  (CARDURA) 8 MG tablet Take 8 mg by mouth at bedtime.      . finasteride (PROSCAR) 5 MG tablet Take 5 mg by mouth daily.      . fluticasone (FLONASE) 50 MCG/ACT nasal spray Place 2 sprays into both nostrils daily.  16 g  6  . losartan-hydrochlorothiazide (HYZAAR) 100-25 MG per tablet Take 1 tablet by mouth daily.      Marland Kitchen omeprazole (PRILOSEC) 10 MG capsule Take 10 mg by mouth daily.      . sertraline (ZOLOFT) 50 MG tablet Take 1 tablet (50 mg total) by mouth daily.  90 tablet  6  . timolol (TIMOPTIC) 0.5 % ophthalmic solution Place 1 drop into the right eye daily.       . traZODone (DESYREL) 50 MG tablet Take 50 mg by mouth at bedtime.      Marland Kitchen HYDROcodone-acetaminophen (NORCO/VICODIN) 5-325 MG per tablet Take 1-2 tablets by mouth every 6 (six) hours as needed.  15 tablet  0   No current facility-administered medications on file prior to visit.  BP 100/60  Pulse 79  Temp(Src) 98.5 F (36.9 C) (Oral)  Resp 20  Ht 5\' 11"  (1.803 m)  Wt 213 lb (96.616 kg)  BMI 29.72 kg/m2  SpO2 97%     Review of Systems  Constitutional: Negative for fever, chills, appetite change and fatigue.  HENT: Negative for congestion, dental problem, ear pain, hearing loss, sore throat, tinnitus, trouble swallowing and voice change.   Eyes: Negative for pain, discharge and visual disturbance.  Respiratory: Negative for cough, chest tightness, wheezing and stridor.   Cardiovascular: Positive for chest pain. Negative for palpitations and leg swelling.  Gastrointestinal: Negative for nausea, vomiting, abdominal pain, diarrhea, constipation, blood in stool and abdominal distention.  Genitourinary: Negative for urgency, hematuria, flank pain, discharge, difficulty urinating and genital sores.  Musculoskeletal: Negative for arthralgias, back pain, gait problem, joint swelling, myalgias and neck stiffness.  Skin: Negative for rash.  Neurological: Negative for dizziness, syncope, speech difficulty, weakness, numbness and  headaches.  Hematological: Negative for adenopathy. Does not bruise/bleed easily.  Psychiatric/Behavioral: Negative for behavioral problems and dysphoric mood. The patient is not nervous/anxious.        Objective:   Physical Exam  Constitutional: He is oriented to person, place, and time. He appears well-developed.  HENT:  Head: Normocephalic.  Right Ear: External ear normal.  Left Ear: External ear normal.  Eyes: Conjunctivae and EOM are normal.  Neck: Normal range of motion.  Cardiovascular: Normal rate and normal heart sounds.   Pulmonary/Chest: Breath sounds normal. He exhibits tenderness.  Very  mild tenderness along the left costochondral junction  Abdominal: Bowel sounds are normal.  Musculoskeletal: Normal range of motion. He exhibits no edema and no tenderness.  Neurological: He is alert and oriented to person, place, and time.  Psychiatric: He has a normal mood and affect. His behavior is normal.          Assessment & Plan:   Chest wall pain.  We'll treat with Mobic and observe.  Hypertension, very well controlled.  Continue present regimen  Return office visit as scheduled or as needed

## 2014-03-04 NOTE — Patient Instructions (Signed)
Call or return to clinic prn if these symptoms worsen or fail to improve as anticipated.

## 2014-03-06 NOTE — ED Provider Notes (Signed)
Medical screening examination/treatment/procedure(s) were conducted as a shared visit with non-physician practitioner(s) and myself.  I personally evaluated the patient during the encounter.   EKG Interpretation   Date/Time:  Sunday February 23 2014 20:46:48 EDT Ventricular Rate:  54 PR Interval:  151 QRS Duration: 112 QT Interval:  471 QTC Calculation: 446 R Axis:   -45 Text Interpretation:  Sinus rhythm Ventricular bigeminy Left anterior  fascicular block Abnormal R-wave progression, early transition Confirmed  by Jeneen Rinks  MD, Williams ([id number]) on 02/23/2014 10:19:34 PM      Patient seen and evaluated independently. Care discussed with PA [facility]. Patient with atypical pain. EKG and troponin show no sign of myocardial injury. Symptoms consistent with simple pleurisy. He is not tachycardic, febrile, or hypoxemic. He is appropriate for outpatient treatment.  Tanna Furry, MD 03/06/14 [phone]

## 2014-05-29 ENCOUNTER — Other Ambulatory Visit: Payer: Self-pay | Admitting: Internal Medicine

## 2014-07-16 ENCOUNTER — Encounter: Payer: Self-pay | Admitting: Family Medicine

## 2014-07-16 ENCOUNTER — Ambulatory Visit (INDEPENDENT_AMBULATORY_CARE_PROVIDER_SITE_OTHER): Payer: Commercial Managed Care - HMO | Admitting: Family Medicine

## 2014-07-16 VITALS — BP 137/68 | HR 76 | Temp 98.0°F | Ht 71.0 in | Wt 206.0 lb

## 2014-07-16 DIAGNOSIS — L0292 Furuncle, unspecified: Secondary | ICD-10-CM

## 2014-07-16 DIAGNOSIS — Z23 Encounter for immunization: Secondary | ICD-10-CM

## 2014-07-16 MED ORDER — DOXYCYCLINE HYCLATE 100 MG PO CAPS: 100.0000 mg | ORAL_CAPSULE | Freq: Two times a day (BID) | ORAL | Status: AC

## 2014-07-16 NOTE — Progress Notes (Signed)
   Subjective:    Patient ID: Jared [name], male    DOB: Aug 31, 1941, 73 y.o.   MRN: [id number]  HPI Here for 2 weeks of a tender boil in the left groin area that is getting larger. No fever.    Review of Systems  Constitutional: Negative.        Objective:   Physical Exam  Constitutional: He appears well-developed and well-nourished.  Skin:  There is a large tender infected cyst on the left suprapubic area          Assessment & Plan:  The cyst was lanced with a scalpel and a large amount of purulent fluid was extracted. A sample was sent for a wound culture. Treat with Doxycycline and hot tub soaks.

## 2014-07-16 NOTE — Addendum Note (Signed)
Addended by: Aggie Hacker A on: 07/16/2014 02:20 PM   Modules accepted: Orders

## 2014-07-16 NOTE — Progress Notes (Signed)
Pre visit review using our clinic review tool, if applicable. No additional management support is needed unless otherwise documented below in the visit note. 

## 2014-07-19 LAB — WOUND CULTURE
Gram Stain: NONE SEEN
Gram Stain: NONE SEEN

## 2014-07-28 ENCOUNTER — Other Ambulatory Visit: Payer: Commercial Managed Care - HMO

## 2014-08-04 ENCOUNTER — Encounter: Payer: Commercial Managed Care - HMO | Admitting: Internal Medicine

## 2014-08-04 ENCOUNTER — Other Ambulatory Visit (INDEPENDENT_AMBULATORY_CARE_PROVIDER_SITE_OTHER): Payer: Commercial Managed Care - HMO

## 2014-08-04 DIAGNOSIS — Z0000 Encounter for general adult medical examination without abnormal findings: Secondary | ICD-10-CM

## 2014-08-04 DIAGNOSIS — N400 Enlarged prostate without lower urinary tract symptoms: Secondary | ICD-10-CM

## 2014-08-04 LAB — POCT URINALYSIS DIPSTICK
Bilirubin, UA: NEGATIVE
Blood, UA: NEGATIVE
Glucose, UA: NEGATIVE
KETONES UA: NEGATIVE
Leukocytes, UA: NEGATIVE
NITRITE UA: NEGATIVE
PH UA: 7
PROTEIN UA: NEGATIVE
Spec Grav, UA: 1.02
Urobilinogen, UA: 0.2

## 2014-08-04 LAB — HEPATIC FUNCTION PANEL
ALBUMIN: 4.1 g/dL (ref 3.5–5.2)
ALT: 25 U/L (ref 0–53)
AST: 28 U/L (ref 0–37)
Alkaline Phosphatase: 65 U/L (ref 39–117)
Bilirubin, Direct: 0.1 mg/dL (ref 0.0–0.3)
TOTAL PROTEIN: 7.4 g/dL (ref 6.0–8.3)
Total Bilirubin: 0.9 mg/dL (ref 0.2–1.2)

## 2014-08-04 LAB — LIPID PANEL
Cholesterol: 217 mg/dL — ABNORMAL HIGH (ref 0–200)
HDL: 35.5 mg/dL — ABNORMAL LOW (ref >39.00)
LDL Cholesterol: 156 mg/dL — ABNORMAL HIGH (ref 0–99)
NONHDL: 181.5
Total CHOL/HDL Ratio: 6
Triglycerides: 126 mg/dL (ref 0.0–149.0)
VLDL: 25.2 mg/dL (ref 0.0–40.0)

## 2014-08-04 LAB — BASIC METABOLIC PANEL
BUN: 24 mg/dL — ABNORMAL HIGH (ref 6–23)
CHLORIDE: 105 meq/L (ref 96–112)
CO2: 29 mEq/L (ref 19–32)
Calcium: 9.6 mg/dL (ref 8.4–10.5)
Creatinine, Ser: 1.1 mg/dL (ref 0.4–1.5)
GFR: 67.84 mL/min (ref >60.00)
Glucose, Bld: 100 mg/dL — ABNORMAL HIGH (ref 70–99)
Potassium: 4.2 mEq/L (ref 3.5–5.1)
Sodium: 141 mEq/L (ref 135–145)

## 2014-08-04 LAB — CBC WITH DIFFERENTIAL/PLATELET
BASOS PCT: 1.2 % (ref 0.0–3.0)
Basophils Absolute: 0.1 10*3/uL (ref 0.0–0.1)
Eosinophils Absolute: 0.3 10*3/uL (ref 0.0–0.7)
Eosinophils Relative: 5.5 % — ABNORMAL HIGH (ref 0.0–5.0)
HCT: 44 % (ref 39.0–52.0)
HEMOGLOBIN: 15.1 g/dL (ref 13.0–17.0)
LYMPHS ABS: 2.9 10*3/uL (ref 0.7–4.0)
Lymphocytes Relative: 56.4 % — ABNORMAL HIGH (ref 12.0–46.0)
MCHC: 34.4 g/dL (ref 30.0–36.0)
MCV: 86.4 fl (ref 78.0–100.0)
MONO ABS: 0.5 10*3/uL (ref 0.1–1.0)
Monocytes Relative: 8.8 % (ref 3.0–12.0)
Neutro Abs: 1.4 10*3/uL (ref 1.4–7.7)
Neutrophils Relative %: 28.1 % — ABNORMAL LOW (ref 43.0–77.0)
Platelets: 177 10*3/uL (ref 150.0–400.0)
RBC: 5.09 Mil/uL (ref 4.22–5.81)
RDW: 13.1 % (ref 11.5–15.5)
WBC: 5.1 10*3/uL (ref 4.0–10.5)

## 2014-08-04 LAB — PSA: PSA: 1.12 ng/mL (ref 0.10–4.00)

## 2014-08-04 LAB — TSH: TSH: 1.88 u[IU]/mL (ref 0.35–4.50)

## 2014-08-08 ENCOUNTER — Ambulatory Visit (INDEPENDENT_AMBULATORY_CARE_PROVIDER_SITE_OTHER): Payer: Commercial Managed Care - HMO | Admitting: Internal Medicine

## 2014-08-08 ENCOUNTER — Encounter: Payer: Self-pay | Admitting: Internal Medicine

## 2014-08-08 VITALS — BP 110/74 | HR 60 | Temp 97.9°F | Resp 20 | Ht 70.5 in | Wt 204.0 lb

## 2014-08-08 DIAGNOSIS — F411 Generalized anxiety disorder: Secondary | ICD-10-CM

## 2014-08-08 DIAGNOSIS — G4733 Obstructive sleep apnea (adult) (pediatric): Secondary | ICD-10-CM

## 2014-08-08 DIAGNOSIS — Z86010 Personal history of colonic polyps: Secondary | ICD-10-CM

## 2014-08-08 DIAGNOSIS — Z0000 Encounter for general adult medical examination without abnormal findings: Secondary | ICD-10-CM

## 2014-08-08 DIAGNOSIS — K219 Gastro-esophageal reflux disease without esophagitis: Secondary | ICD-10-CM

## 2014-08-08 DIAGNOSIS — I10 Essential (primary) hypertension: Secondary | ICD-10-CM

## 2014-08-08 DIAGNOSIS — J3089 Other allergic rhinitis: Secondary | ICD-10-CM

## 2014-08-08 DIAGNOSIS — Z23 Encounter for immunization: Secondary | ICD-10-CM

## 2014-08-08 NOTE — Progress Notes (Signed)
   Subjective:    Patient ID: Jared [name], male    DOB: [birth year], 73 y.o.   MRN: [id number]  HPI  Wt Readings from Last 3 Encounters:  08/08/14 204 lb (92.534 kg)  07/16/14 206 lb (93.441 kg)  03/04/14 213 lb (96.616 kg)    Review of Systems     Objective:   Physical Exam        Assessment & Plan:

## 2014-08-08 NOTE — Progress Notes (Signed)
Patient ID: Jared [name], male   DOB: 06/24/1941, 73 y.o.   MRN: [id number]  Subjective:    Patient ID: Jared [name], male    DOB: 07-31-1941, 73 y.o.   MRN: [id number]  HPI 73 year-old patient who is seen today for a preventive health examination.  Medical problems include anxiety depression.  He has treated hypertension and allergic rhinitis. He has a history of BPH.    1. Risk factors, based on past  M,S,F history-   Cardiovascular risk factors include hypertension  2.  Physical activities: no activity restrictions does go to the health club infrequently.  3.  Depression/mood: positive for depression presently is on medication there have been some recent situational stressors; he is followed closely by Dr. Caprice Beaver.  He recently self discontinued sertraline, which was resumed about 1 month ago  4.  Hearing: no deficits  5.  ADL's: independent in all aspects of daily living  6.  Fall risk:  low  7.  Home safety: no problems identified  8.  Height weight, and visual acuity; height and weight are stable no difficulty with visual acuity  9.  Counseling: more regular exercise encouraged heart healthy diet restricted salt  10. Lab orders based on risk factors: all discussed laboratory studies were reviewed. PSA was slightly elevated we'll recheck in 4-6 months  11. Referral : not appropriate at this time. Will need followup colonoscopy in one year  12. Care plan: heart healthy diet regular exercise all encouraged  13. Cognitive assessment:  Alert and appropriate with normal affect. No cognitive dysfunction.  14.  Preventive services will include annual health assessments with screening lab.  He did have screening colonoscopy last year.  Anuli examinations recommended.  Patient was provided with a written and personalized care plan  15.  Provider list includes primary care psychiatry, gastroenterology, as well as ophthalmology       Preventive Screening-Counseling & Management   Alcohol-Tobacco  Smoking Status: never   Allergies:  No Known Drug Allergies   Past History:  Past Medical History:  Reviewed history from 04/08/2009 and no changes required.  Allergic rhinitis  Anxiety  Colonic polyps, hx of  Depression  GERD  Hypertension  Nephrolithiasis, hx of  Benign prostatic hypertrophy   Past Surgical History:   Right inguinal hernia repair 2014  Appendectomy  Cholecystectomy  history of fracture, right wrist x 2  history of fracture, left wrist  status post surgery, left foot  colonoscopy 2008    Family History:  Reviewed history from 10/05/2007 and no changes required.  father died age 47, MI, history hypertension  mother died age 17 MI  No siblings  Social History:  Reviewed history from 10/05/2007 and no changes required.  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  HENT: Negative for congestion, dental problem, ear pain, hearing loss, mouth sores, rhinorrhea, sinus pressure, sneezing, tinnitus, trouble swallowing and voice change.   Eyes: Negative for photophobia, pain, redness and visual disturbance.  Respiratory: Negative for apnea, cough, choking, chest tightness, shortness of breath and wheezing.   Cardiovascular: Negative for chest pain, palpitations and leg swelling.  Gastrointestinal: Negative for nausea, vomiting, abdominal pain, diarrhea, constipation, blood in stool, abdominal distention, anal bleeding and rectal pain.  Genitourinary: Negative for dysuria, urgency, frequency, hematuria, flank  pain, decreased urine volume, discharge, penile swelling, scrotal swelling, difficulty urinating, genital sores and testicular pain.  Musculoskeletal: Negative for myalgias, back pain, joint swelling, arthralgias, gait  problem, neck pain and neck stiffness.  Skin: Negative for color change, rash and wound.  Neurological: Negative for dizziness, tremors, seizures, syncope, facial asymmetry, speech difficulty, weakness, light-headedness, numbness and headaches.  Hematological: Negative for adenopathy. Does not bruise/bleed easily.  Psychiatric/Behavioral: Negative for suicidal ideas, hallucinations, behavioral problems, confusion, sleep disturbance, self-injury, dysphoric mood, decreased concentration and agitation. The patient is not nervous/anxious.        Objective:   Physical Exam  Constitutional: He appears well-developed and well-nourished.  HENT:  Head: Normocephalic and atraumatic.  Right Ear: External ear normal.  Left Ear: External ear normal.  Nose: Nose normal.  Mouth/Throat: Oropharynx is clear and moist.  Eyes: Conjunctivae and EOM are normal. Pupils are equal, round, and reactive to light. No scleral icterus.  Neck: Normal range of motion. Neck supple. No JVD present. No thyromegaly present.  Cardiovascular: Regular rhythm, normal heart sounds and intact distal pulses.  Exam reveals no gallop and no friction rub.   No murmur heard. Pulmonary/Chest: Effort normal and breath sounds normal. He exhibits no tenderness.  Abdominal: Soft. Bowel sounds are normal. He exhibits no distension and no mass. There is no tenderness.  Genitourinary: Prostate normal and penis normal.  Musculoskeletal: Normal range of motion. He exhibits no edema or tenderness.  Lymphadenopathy:    He has no cervical adenopathy.  Neurological: He is alert. He has normal reflexes. No cranial nerve deficit. Coordination normal.  Slight decreased vibratory sensation right lower leg  Skin: Skin is warm and dry. No rash noted.  Psychiatric: He has a normal mood and affect. His behavior is normal.          Assessment & Plan:  History colonic polyps. We'll schedule for followup colonoscopy in 4 yrs  Anxiety depression  we'll taper and discontinue Wellbutrin. Hypertension stable Preventive health examination. Laboratory studies reviewed  Recheck 3 months

## 2014-08-08 NOTE — Progress Notes (Signed)
Pre visit review using our clinic review tool, if applicable. No additional management support is needed unless otherwise documented below in the visit note. 

## 2014-08-08 NOTE — Patient Instructions (Addendum)
Limit your sodium (Salt) intake    It is important that you exercise regularly, at least 20 minutes 3 to 4 times per week.  If you develop chest pain or shortness of breath seek  medical attention.  You need to lose weight.  Consider a lower calorie diet and regular exercise.\  Return in 6 months for follow-up   Health Maintenance A healthy lifestyle and preventative care can promote health and wellness.  Maintain regular health, dental, and eye exams.  Eat a healthy diet. Foods like vegetables, fruits, whole grains, low-fat dairy products, and lean protein foods contain the nutrients you need and are low in calories. Decrease your intake of foods high in solid fats, added sugars, and salt. Get information about a proper diet from your health care provider, if necessary.  Regular physical exercise is one of the most important things you can do for your health. Most adults should get at least 150 minutes of moderate-intensity exercise (any activity that increases your heart rate and causes you to sweat) each week. In addition, most adults need muscle-strengthening exercises on 2 or more days a week.   Maintain a healthy weight. The body mass index (BMI) is a screening tool to identify possible weight problems. It provides an estimate of body fat based on height and weight. Your health care provider can find your BMI and can help you achieve or maintain a healthy weight. For males 20 years and older:  A BMI below 18.5 is considered underweight.  A BMI of 18.5 to 24.9 is normal.  A BMI of 25 to 29.9 is considered overweight.  A BMI of 30 and above is considered obese.  Maintain normal blood lipids and cholesterol by exercising and minimizing your intake of saturated fat. Eat a balanced diet with plenty of fruits and vegetables. Blood tests for lipids and cholesterol should begin at age 20 and be repeated every 5 years. If your lipid or cholesterol levels are high, you are over age 50, or  you are at high risk for heart disease, you may need your cholesterol levels checked more frequently.Ongoing high lipid and cholesterol levels should be treated with medicines if diet and exercise are not working.  If you smoke, find out from your health care provider how to quit. If you do not use tobacco, do not start.  Lung cancer screening is recommended for adults aged 55-80 years who are at high risk for developing lung cancer because of a history of smoking. A yearly low-dose CT scan of the lungs is recommended for people who have at least a 30-pack-year history of smoking and are current smokers or have quit within the past 15 years. A pack year of smoking is smoking an average of 1 pack of cigarettes a day for 1 year (for example, a 30-pack-year history of smoking could mean smoking 1 pack a day for 30 years or 2 packs a day for 15 years). Yearly screening should continue until the smoker has stopped smoking for at least 15 years. Yearly screening should be stopped for people who develop a health problem that would prevent them from having lung cancer treatment.  If you choose to drink alcohol, do not have more than 2 drinks per day. One drink is considered to be 12 oz (360 mL) of beer, 5 oz (150 mL) of wine, or 1.5 oz (45 mL) of liquor.  Avoid the use of street drugs. Do not share needles with anyone. Ask for help if you   need support or instructions about stopping the use of drugs.  High blood pressure causes heart disease and increases the risk of stroke. Blood pressure should be checked at least every 1-2 years. Ongoing high blood pressure should be treated with medicines if weight loss and exercise are not effective.  If you are 45-79 years old, ask your health care provider if you should take aspirin to prevent heart disease.  Diabetes screening involves taking a blood sample to check your fasting blood sugar level. This should be done once every 3 years after age 45 if you are at a  normal weight and without risk factors for diabetes. Testing should be considered at a younger age or be carried out more frequently if you are overweight and have at least 1 risk factor for diabetes.  Colorectal cancer can be detected and often prevented. Most routine colorectal cancer screening begins at the age of 50 and continues through age 75. However, your health care provider may recommend screening at an earlier age if you have risk factors for colon cancer. On a yearly basis, your health care provider may provide home test kits to check for hidden blood in the stool. A small camera at the end of a tube may be used to directly examine the colon (sigmoidoscopy or colonoscopy) to detect the earliest forms of colorectal cancer. Talk to your health care provider about this at age 50 when routine screening begins. A direct exam of the colon should be repeated every 5-10 years through age 75, unless early forms of precancerous polyps or small growths are found.  People who are at an increased risk for hepatitis B should be screened for this virus. You are considered at high risk for hepatitis B if:  You were born in a country where hepatitis B occurs often. Talk with your health care provider about which countries are considered high risk.  Your parents were born in a high-risk country and you have not received a shot to protect against hepatitis B (hepatitis B vaccine).  You have HIV or AIDS.  You use needles to inject street drugs.  You live with, or have sex with, someone who has hepatitis B.  You are a man who has sex with other men (MSM).  You get hemodialysis treatment.  You take certain medicines for conditions like cancer, organ transplantation, and autoimmune conditions.  Hepatitis C blood testing is recommended for all people born from 1945 through 1965 and any individual with known risk factors for hepatitis C.  Healthy men should no longer receive prostate-specific antigen  (PSA) blood tests as part of routine cancer screening. Talk to your health care provider about prostate cancer screening.  Testicular cancer screening is not recommended for adolescents or adult males who have no symptoms. Screening includes self-exam, a health care provider exam, and other screening tests. Consult with your health care provider about any symptoms you have or any concerns you have about testicular cancer.  Practice safe sex. Use condoms and avoid high-risk sexual practices to reduce the spread of sexually transmitted infections (STIs).  You should be screened for STIs, including gonorrhea and chlamydia if:  You are sexually active and are younger than 24 years.  You are older than 24 years, and your health care provider tells you that you are at risk for this type of infection.  Your sexual activity has changed since you were last screened, and you are at an increased risk for chlamydia or gonorrhea. Ask your health   care provider if you are at risk.  If you are at risk of being infected with HIV, it is recommended that you take a prescription medicine daily to prevent HIV infection. This is called pre-exposure prophylaxis (PrEP). You are considered at risk if:  You are a man who has sex with other men (MSM).  You are a heterosexual man who is sexually active with multiple partners.  You take drugs by injection.  You are sexually active with a partner who has HIV.  Talk with your health care provider about whether you are at high risk of being infected with HIV. If you choose to begin PrEP, you should first be tested for HIV. You should then be tested every 3 months for as long as you are taking PrEP.  Use sunscreen. Apply sunscreen liberally and repeatedly throughout the day. You should seek shade when your shadow is shorter than you. Protect yourself by wearing long sleeves, pants, a wide-brimmed hat, and sunglasses year round whenever you are outdoors.  Tell your health  care provider of new moles or changes in moles, especially if there is a change in shape or color. Also, tell your health care provider if a mole is larger than the size of a pencil eraser.  A one-time screening for abdominal aortic aneurysm (AAA) and surgical repair of large AAAs by ultrasound is recommended for men aged 65-75 years who are current or former smokers.  Stay current with your vaccines (immunizations). Document Released: 03/03/2008 Document Revised: 09/10/2013 Document Reviewed: 01/31/2011 [employer] Patient Information 2015 [employer]. This information is not intended to replace advice given to you by your health care provider. Make sure you discuss any questions you have with your health care provider.  

## 2014-08-25 ENCOUNTER — Other Ambulatory Visit: Payer: Self-pay | Admitting: Internal Medicine

## 2014-09-22 ENCOUNTER — Other Ambulatory Visit: Payer: Self-pay | Admitting: Internal Medicine

## 2014-09-24 ENCOUNTER — Telehealth: Payer: Self-pay | Admitting: Internal Medicine

## 2014-09-24 NOTE — Telephone Encounter (Signed)
Pt states he needs  [facility] referral for his upcoming appt with Dr. Allyson Sabal on Jan 21st for follow up on skin cancer screening.

## 2014-10-09 DIAGNOSIS — L570 Actinic keratosis: Secondary | ICD-10-CM | POA: Diagnosis not present

## 2014-10-09 DIAGNOSIS — L821 Other seborrheic keratosis: Secondary | ICD-10-CM | POA: Diagnosis not present

## 2014-10-09 DIAGNOSIS — L814 Other melanin hyperpigmentation: Secondary | ICD-10-CM | POA: Diagnosis not present

## 2014-10-09 DIAGNOSIS — D225 Melanocytic nevi of trunk: Secondary | ICD-10-CM | POA: Diagnosis not present

## 2014-11-13 ENCOUNTER — Telehealth: Payer: Self-pay | Admitting: *Deleted

## 2014-11-13 ENCOUNTER — Ambulatory Visit: Payer: Commercial Managed Care - HMO | Admitting: Internal Medicine

## 2014-11-13 NOTE — Telephone Encounter (Signed)
Spoke to pt, told him missed appt this morning at 8:30. Pt said he thought it was tomorrow morning. Appt scheduled for tomorrow at 8:15 AM with Dr. Raliegh Ip. Pt verbalized understanding.

## 2014-11-14 ENCOUNTER — Ambulatory Visit (INDEPENDENT_AMBULATORY_CARE_PROVIDER_SITE_OTHER): Payer: Commercial Managed Care - HMO | Admitting: Internal Medicine

## 2014-11-14 ENCOUNTER — Encounter: Payer: Self-pay | Admitting: Internal Medicine

## 2014-11-14 VITALS — BP 120/70 | HR 83 | Temp 98.0°F | Resp 20 | Ht 70.5 in | Wt 204.0 lb

## 2014-11-14 DIAGNOSIS — F329 Major depressive disorder, single episode, unspecified: Secondary | ICD-10-CM | POA: Diagnosis not present

## 2014-11-14 DIAGNOSIS — I10 Essential (primary) hypertension: Secondary | ICD-10-CM | POA: Diagnosis not present

## 2014-11-14 DIAGNOSIS — B070 Plantar wart: Secondary | ICD-10-CM | POA: Diagnosis not present

## 2014-11-14 DIAGNOSIS — F32A Depression, unspecified: Secondary | ICD-10-CM

## 2014-11-14 NOTE — Progress Notes (Signed)
Subjective:    Patient ID: Jared [name], male    DOB: 1941/02/15, 74 y.o.   MRN: [id number]  HPI  74 year old patient who has a history of treated hypertension as well as a history depression.  Complaints include a painful nodule involving the plantar aspect of his left foot.  He states that he has pain when he walks barefoot. Otherwise, doing quite well.  His depression has been stable.  Blood pressure remains nicely controlled.  He was seen late last year for his annual exam  Past Medical History  Diagnosis Date  . ALLERGIC RHINITIS 10/05/2007  . ANXIETY 10/05/2007  . BENIGN PROSTATIC HYPERTROPHY 04/08/2009  . COLONIC POLYPS, HX OF 10/05/2007  . DEPRESSION 10/05/2007  . GERD 10/05/2007  . HYPERTENSION 10/05/2007  . NEPHROLITHIASIS, HX OF 10/05/2007  . SLEEP APNEA, OBSTRUCTIVE 10/02/2008  . Heart murmur     History   Social History  . Marital Status: Widowed    Spouse Name: N/A  . Number of Children: N/A  . Years of Education: N/A   Occupational History  . Not on file.   Social History Main Topics  . Smoking status: Never Smoker   . Smokeless tobacco: Never Used  . Alcohol Use: No  . Drug Use: No  . Sexual Activity: Not on file   Other Topics Concern  . Not on file   Social History Narrative    Past Surgical History  Procedure Laterality Date  . Appendectomy    . Cholecystectomy    . Wrist fracture surgery      right x2   left x1  . Foot surgery      left  . Cataract extraction  2014    bilateral  . Hernia repair  2014    Family History  Problem Relation Age of Onset  . Colon cancer Maternal Aunt 70    No Known Allergies  Current Outpatient Prescriptions on File Prior to Visit  Medication Sig Dispense Refill  . ALPRAZolam (XANAX) 1 MG tablet Take 1 mg by mouth 2 (two) times daily.     Marland Kitchen amLODipine (NORVASC) 10 MG tablet Take 10 mg by mouth daily.    Marland Kitchen aspirin 81 MG tablet Take 81 mg by mouth daily.      Marland Kitchen buPROPion (WELLBUTRIN SR) 150 MG 12 hr tablet  Take 150 mg by mouth 2 (two) times daily.    . finasteride (PROSCAR) 5 MG tablet Take 5 mg by mouth daily.    Marland Kitchen losartan-hydrochlorothiazide (HYZAAR) 100-25 MG per tablet TAKE 1 TABLET BY MOUTH EVERY DAY 90 tablet 1  . meloxicam (MOBIC) 15 MG tablet TAKE 1 TABLET (15 MG TOTAL) BY MOUTH DAILY. (Patient taking differently: TAKE 1 TABLET (15 MG TOTAL) BY MOUTH DAILY PRN) 30 tablet 5  . omeprazole (PRILOSEC) 10 MG capsule Take 10 mg by mouth daily.    . sertraline (ZOLOFT) 100 MG tablet Take 50 mg by mouth daily.     . traZODone (DESYREL) 50 MG tablet Take 50 mg by mouth at bedtime.     No current facility-administered medications on file prior to visit.    BP 120/70 mmHg  Pulse 83  Temp(Src) 98 F (36.7 C) (Oral)  Resp 20  Ht 5' 10.5" (1.791 m)  Wt 204 lb (92.534 kg)  BMI 28.85 kg/m2  SpO2 98%     Review of Systems  Constitutional: Negative for fever, chills, appetite change and fatigue.  HENT: Negative for congestion, dental problem, ear pain,  hearing loss, sore throat, tinnitus, trouble swallowing and voice change.   Eyes: Negative for pain, discharge and visual disturbance.  Respiratory: Negative for cough, chest tightness, wheezing and stridor.   Cardiovascular: Negative for chest pain, palpitations and leg swelling.  Gastrointestinal: Negative for nausea, vomiting, abdominal pain, diarrhea, constipation, blood in stool and abdominal distention.  Genitourinary: Negative for urgency, hematuria, flank pain, discharge, difficulty urinating and genital sores.  Musculoskeletal: Positive for gait problem. Negative for myalgias, back pain, joint swelling, arthralgias and neck stiffness.  Skin: Positive for wound. Negative for rash.  Neurological: Negative for dizziness, syncope, speech difficulty, weakness, numbness and headaches.  Hematological: Negative for adenopathy. Does not bruise/bleed easily.  Psychiatric/Behavioral: Negative for behavioral problems and dysphoric mood. The  patient is not nervous/anxious.        Objective:   Physical Exam  Constitutional: He is oriented to person, place, and time. He appears well-developed.  HENT:  Head: Normocephalic.  Right Ear: External ear normal.  Left Ear: External ear normal.  Eyes: Conjunctivae and EOM are normal.  Neck: Normal range of motion.  Cardiovascular: Normal rate and normal heart sounds.   Pulmonary/Chest: Breath sounds normal.  Abdominal: Bowel sounds are normal.  Musculoskeletal: Normal range of motion. He exhibits no edema or tenderness.  Neurological: He is alert and oriented to person, place, and time.  Skin:  Subcutaneous nodule over the mid metatarsal heads.  Unclear whether this was a corn or more likely a plantar wart  Psychiatric: He has a normal mood and affect. His behavior is normal.          Assessment & Plan:   Plantar wart left foot  Hypertension, stable  Options discussed.  Will set up for podiatry evaluation and treatment

## 2014-11-14 NOTE — Patient Instructions (Addendum)
Podiatry referral as discussed  Limit your sodium (Salt) intake  Please check your blood pressure on a regular basis.  If it is consistently greater than 150/90, please make an office appointment.    It is important that you exercise regularly, at least 20 minutes 3 to 4 times per week.  If you develop chest pain or shortness of breath seek  medical attention.  Plantar Warts Warts are benign (noncancerous) growths of the outer skin layer. They can occur at any time in life but are most common during childhood and the teen years. Warts can occur on many skin surfaces of the body. When they occur on the underside (sole) of your foot they are called plantar warts. They often emerge in groups with several small warts encircling a larger growth. CAUSES  Human papillomavirus (HPV) is the cause of plantar warts. HPV attacks a break in the skin of the foot. Walking barefoot can lead to exposure to the wart virus. Plantar warts tend to develop over areas of pressure such as the heel and ball of the foot. Plantar warts often grow into the deeper layers of skin. They may spread to other areas of the sole but cannot spread to other areas of the body. SYMPTOMS  You may also notice a growth on the undersurface of your foot. The wart may grow directly into the sole of the foot, or rise above the surface of the skin on the sole of the foot, or both. They are most often flat from pressure. Warts generally do not cause itching but may cause pain in the area of the wart when you put weight on your foot. DIAGNOSIS  Diagnosis is made by physical examination. This means your caregiver discovers it while examining your foot.  TREATMENT  There are many ways to treat plantar warts. However, warts are very tough. Sometimes it is difficult to treat them so that they go away completely and do not grow back. Any treatment must be done regularly to work. If left untreated, most plantar warts will eventually disappear over a  period of one to two years. Treatments you can do at home include:  Putting duct tape over the top of the wart (occlusion) has been found to be effective over several months. The duct tape should be removed each night and reapplied until the wart has disappeared.  Placing over-the-counter medications on top of the wart to help kill the wart virus and remove the wart tissue (salicylic acid, cantharidin, and dichloroacetic acid) are useful. These are called keratolytic agents. These medications make the skin soft and gradually layers will shed away. These compounds are usually placed on the wart each night and then covered with a bandage. They are also available in premedicated bandage form. Avoid surrounding skin when applying these liquids as these medications can burn healthy skin. The treatment may take several months of nightly use to be effective.  Cryotherapy to freeze the wart has recently become available over-the-counter for children 4 years and older. This system makes use of a soft narrow applicator connected to a bottle of compressed cold liquid that is applied directly to the wart. This medication can burn healthy skin and should be used with caution.  As with all over-the-counter medications, read the directions carefully before use. Treatments generally done in your caregiver's office include:  Some aggressive treatments may cause discomfort, discoloration, and scarring of the surrounding skin. The risks and benefits of treatment should be discussed with your caregiver.  Freezing  the wart with liquid nitrogen (cryotherapy, see above).  Burning the wart with use of very high heat (cautery).  Injecting medication into the wart.  Surgically removing or laser treatment of the wart.  Your caregiver may refer you to a dermatologist for difficult to treat large-sized warts or large numbers of warts. HOME CARE INSTRUCTIONS   Soak the affected area in warm water. Dry the area completely  when you are done. Remove the top layer of softened skin, then apply the chosen topical medication and reapply a bandage.  Remove the bandage daily and file excess wart tissue (pumice stone works well for this purpose). Repeat the entire process daily or every other day for weeks until the plantar wart disappears.  Several brands of salicylic acid pads are available as over-the-counter remedies.  Pain can be relieved by wearing a donut bandage. This is a bandage with a hole in it. The bandage is put on with the hole over the wart. This helps take the pressure off the wart and gives pain relief. To help prevent plantar warts:  Wear shoes and socks and change them daily.  Keep feet clean and dry.  Check your feet and your children's feet regularly.  Avoid direct contact with warts on other people.  Have growths or changes on your skin checked by your caregiver. Document Released: 11/26/2003 Document Revised: 01/20/2014 Document Reviewed: 05/06/2009 [employer] Patient Information 2015 [place], [place]. This information is not intended to replace advice given to you by your health care provider. Make sure you discuss any questions you have with your health care provider.

## 2014-11-14 NOTE — Progress Notes (Signed)
Pre visit review using our clinic review tool, if applicable. No additional management support is needed unless otherwise documented below in the visit note. 

## 2014-11-25 ENCOUNTER — Ambulatory Visit (INDEPENDENT_AMBULATORY_CARE_PROVIDER_SITE_OTHER): Payer: Commercial Managed Care - HMO | Admitting: Podiatry

## 2014-11-25 ENCOUNTER — Ambulatory Visit (INDEPENDENT_AMBULATORY_CARE_PROVIDER_SITE_OTHER): Payer: Commercial Managed Care - HMO

## 2014-11-25 ENCOUNTER — Ambulatory Visit: Payer: Commercial Managed Care - HMO

## 2014-11-25 ENCOUNTER — Encounter: Payer: Self-pay | Admitting: Podiatry

## 2014-11-25 VITALS — BP 150/83 | HR 61 | Resp 18

## 2014-11-25 DIAGNOSIS — R52 Pain, unspecified: Secondary | ICD-10-CM

## 2014-11-25 DIAGNOSIS — M79672 Pain in left foot: Secondary | ICD-10-CM

## 2014-11-25 DIAGNOSIS — Q828 Other specified congenital malformations of skin: Secondary | ICD-10-CM | POA: Diagnosis not present

## 2014-11-25 NOTE — Progress Notes (Signed)
   Subjective:    Patient ID: Jared [name], male    DOB: 08-28-1941, 74 y.o.   MRN: [id number]  HPI I HAVE A SPOT ON THE BOTTOM OF MY LEFT FOOT AND HAS BEEN GOING ON FOR ABOUT 2 MONTHS AND BURNS AND THROBS    Review of Systems  All other systems reviewed and are negative.      Objective:   Physical Exam: I have reviewed his past medical history medications allergies surgery social history and review of systems. Pulses are strongly palpable bilateral. Neurologic sensorium is intact per Semmes-Weinstein monofilament. Deep tendon reflexes are intact bilateral muscle strength is 5 over 5 dorsiflexors plantar flexors and inverters and everters all intrinsic musculatures intact. Orthopedic evaluation demonstrates all joints distal to the ankle have a full range of motion without crepitus. Cutaneous evaluation of a straight supple well-hydrated cutis with solitary poor keratoma to the plantar aspect subsecond third metatarsophalangeal joint of the left foot.        Assessment & Plan:  Assessment: Soft tissue lesion porokeratosis plantar aspect left foot.  Plan: Debridement of reactive hyperkeratotic lesion chemical destruction of reactive hyperkeratotic lesion under occlusion. He was given both oral and written home-going instructions for the care of this and I will follow-up with him in 6 weeks

## 2015-01-06 ENCOUNTER — Ambulatory Visit (INDEPENDENT_AMBULATORY_CARE_PROVIDER_SITE_OTHER): Payer: Commercial Managed Care - HMO | Admitting: Podiatry

## 2015-01-06 ENCOUNTER — Encounter: Payer: Self-pay | Admitting: Podiatry

## 2015-01-06 VITALS — BP 133/72 | HR 70 | Resp 16

## 2015-01-06 DIAGNOSIS — Q828 Other specified congenital malformations of skin: Secondary | ICD-10-CM | POA: Diagnosis not present

## 2015-01-07 NOTE — Progress Notes (Signed)
He presents today for follow-up of a porokeratotic lesion plantar aspect of the left foot. He states that it seems to be getting much improved.  Objective: Vital signs are stable alert and 3. Pulses are palpable left foot. Porokeratotic lesion very small amount is still remaining debrided that for him today.  Assessment: Porokeratosis Reineke natures of third metatarsal head left foot.  Plan: Debridement of reactive hyperkeratosis suggested he follow up with [place] with recurrence.

## 2015-01-14 ENCOUNTER — Other Ambulatory Visit: Payer: Self-pay | Admitting: Internal Medicine

## 2015-01-16 ENCOUNTER — Other Ambulatory Visit: Payer: Self-pay | Admitting: Internal Medicine

## 2015-02-22 ENCOUNTER — Other Ambulatory Visit: Payer: Self-pay | Admitting: Internal Medicine

## 2015-03-23 ENCOUNTER — Other Ambulatory Visit: Payer: Self-pay | Admitting: Internal Medicine

## 2015-03-24 DIAGNOSIS — H01006 Unspecified blepharitis left eye, unspecified eyelid: Secondary | ICD-10-CM | POA: Diagnosis not present

## 2015-03-24 DIAGNOSIS — H01003 Unspecified blepharitis right eye, unspecified eyelid: Secondary | ICD-10-CM | POA: Diagnosis not present

## 2015-03-24 DIAGNOSIS — H4011X2 Primary open-angle glaucoma, moderate stage: Secondary | ICD-10-CM | POA: Diagnosis not present

## 2015-03-24 DIAGNOSIS — H40019 Open angle with borderline findings, low risk, unspecified eye: Secondary | ICD-10-CM | POA: Diagnosis not present

## 2015-04-07 DIAGNOSIS — H53431 Sector or arcuate defects, right eye: Secondary | ICD-10-CM | POA: Diagnosis not present

## 2015-04-07 DIAGNOSIS — Z961 Presence of intraocular lens: Secondary | ICD-10-CM | POA: Diagnosis not present

## 2015-04-07 DIAGNOSIS — H4011X2 Primary open-angle glaucoma, moderate stage: Secondary | ICD-10-CM | POA: Diagnosis not present

## 2015-04-07 DIAGNOSIS — H3589 Other specified retinal disorders: Secondary | ICD-10-CM | POA: Diagnosis not present

## 2015-06-02 ENCOUNTER — Ambulatory Visit (INDEPENDENT_AMBULATORY_CARE_PROVIDER_SITE_OTHER): Payer: Medicare HMO | Admitting: Internal Medicine

## 2015-06-02 ENCOUNTER — Encounter: Payer: Self-pay | Admitting: Internal Medicine

## 2015-06-02 DIAGNOSIS — Z23 Encounter for immunization: Secondary | ICD-10-CM

## 2015-06-02 DIAGNOSIS — I10 Essential (primary) hypertension: Secondary | ICD-10-CM

## 2015-06-02 DIAGNOSIS — M25512 Pain in left shoulder: Secondary | ICD-10-CM

## 2015-06-02 MED ORDER — METHYLPREDNISOLONE ACETATE 80 MG/ML IJ SUSP
80.0000 mg | Freq: Once | INTRAMUSCULAR | Status: AC
  Administered 2015-06-02: 80 mg via INTRAMUSCULAR

## 2015-06-02 MED ORDER — METHYLPREDNISOLONE ACETATE 80 MG/ML IJ SUSP: 80.0000 mg | Freq: Once | INTRAMUSCULAR | Status: DC

## 2015-06-02 NOTE — Progress Notes (Signed)
Subjective:    Patient ID: Jared [name], male    DOB: 1941/02/25, 74 y.o.   MRN: [id number]  HPI  74 year old patient who has a history of bilateral shoulder pain.  Approximately  3 years ago he was having significant right shoulder pain and surgery for a ruptured rotator cuff recommended.  He deferred surgery and started self rehabilitation at the gym with improvement.  For the past 2 weeks he has had left shoulder pain.  No trauma.  He remains active at his local gym  Past Medical History  Diagnosis Date  . ALLERGIC RHINITIS 10/05/2007  . ANXIETY 10/05/2007  . BENIGN PROSTATIC HYPERTROPHY 04/08/2009  . COLONIC POLYPS, HX OF 10/05/2007  . DEPRESSION 10/05/2007  . GERD 10/05/2007  . HYPERTENSION 10/05/2007  . NEPHROLITHIASIS, HX OF 10/05/2007  . SLEEP APNEA, OBSTRUCTIVE 10/02/2008  . Heart murmur     Social History   Social History  . Marital Status: Widowed    Spouse Name: N/A  . Number of Children: N/A  . Years of Education: N/A   Occupational History  . Not on file.   Social History Main Topics  . Smoking status: Never Smoker   . Smokeless tobacco: Never Used  . Alcohol Use: No  . Drug Use: No  . Sexual Activity: Not on file   Other Topics Concern  . Not on file   Social History Narrative    Past Surgical History  Procedure Laterality Date  . Appendectomy    . Cholecystectomy    . Wrist fracture surgery      right x2   left x1  . Foot surgery      left  . Cataract extraction  2014    bilateral  . Hernia repair  2014    Family History  Problem Relation Age of Onset  . Colon cancer Maternal Aunt 70    No Known Allergies  Current Outpatient Prescriptions on File Prior to Visit  Medication Sig Dispense Refill  . ALPRAZolam (XANAX) 1 MG tablet Take 1 mg by mouth 2 (two) times daily.     Marland Kitchen amLODipine (NORVASC) 10 MG tablet Take 10 mg by mouth daily.    Marland Kitchen amLODipine (NORVASC) 10 MG tablet TAKE 1 TABLET BY MOUTH EVERY DAY 90 tablet 1  . aspirin 81 MG tablet  Take 81 mg by mouth daily.      Marland Kitchen buPROPion (WELLBUTRIN SR) 150 MG 12 hr tablet Take 150 mg by mouth 2 (two) times daily.    . finasteride (PROSCAR) 5 MG tablet Take 5 mg by mouth daily.    Marland Kitchen losartan-hydrochlorothiazide (HYZAAR) 100-25 MG per tablet TAKE 1 TABLET BY MOUTH EVERY DAY 90 tablet 1  . meloxicam (MOBIC) 15 MG tablet TAKE 1 TABLET (15 MG TOTAL) BY MOUTH DAILY. (Patient taking differently: TAKE 1 TABLET (15 MG TOTAL) BY MOUTH DAILY PRN) 30 tablet 5  . omeprazole (PRILOSEC) 10 MG capsule TAKE ONE CAPSULE BY MOUTH EVERY DAY 90 capsule 3  . omeprazole (PRILOSEC) 10 MG capsule TAKE ONE CAPSULE BY MOUTH EVERY DAY 90 capsule 3  . sertraline (ZOLOFT) 100 MG tablet Take 50 mg by mouth daily.     . traZODone (DESYREL) 100 MG tablet     . traZODone (DESYREL) 50 MG tablet Take 50 mg by mouth at bedtime.     No current facility-administered medications on file prior to visit.    BP 140/90 mmHg  Pulse 63  Temp(Src) 97.9 F (36.6 C) (Oral)  Ht 5' 10.5" (1.791 m)  Wt 202 lb (91.627 kg)  BMI 28.56 kg/m2  SpO2 98%     Review of Systems  Constitutional: Negative for fever, chills, appetite change and fatigue.  HENT: Negative for congestion, dental problem, ear pain, hearing loss, sore throat, tinnitus, trouble swallowing and voice change.   Eyes: Negative for pain, discharge and visual disturbance.  Respiratory: Negative for cough, chest tightness, wheezing and stridor.   Cardiovascular: Negative for chest pain, palpitations and leg swelling.  Gastrointestinal: Negative for nausea, vomiting, abdominal pain, diarrhea, constipation, blood in stool and abdominal distention.  Genitourinary: Negative for urgency, hematuria, flank pain, discharge, difficulty urinating and genital sores.  Musculoskeletal: Negative for myalgias, back pain, joint swelling, arthralgias, gait problem and neck stiffness.       Left shoulder pain  Skin: Negative for rash.  Neurological: Negative for dizziness,  syncope, speech difficulty, weakness, numbness and headaches.  Hematological: Negative for adenopathy. Does not bruise/bleed easily.  Psychiatric/Behavioral: Negative for behavioral problems and dysphoric mood. The patient is not nervous/anxious.        Objective:   Physical Exam  Constitutional: He appears well-developed and well-nourished. No distress.  Blood pressure 130/80 on repeat  Musculoskeletal: Normal range of motion.  Left shoulder examined No local tenderness Intact range of motion  Minimal pain only with external rotation resistance test          Assessment & Plan:   Left shoulder rotator cuff tendinopathy.  Will treat with Depo-Medrol.  Will try home rehabilitation exercises.  Orthopedic referral if unimproved Essential hypertension, stable

## 2015-06-02 NOTE — Addendum Note (Signed)
Addended by: Ailene Rud E on: 06/02/2015 10:54 AM   Modules accepted: Orders, Medications

## 2015-06-02 NOTE — Progress Notes (Signed)
Pre visit review using our clinic review tool, if applicable. No additional management support is needed unless otherwise documented below in the visit note. 

## 2015-06-02 NOTE — Patient Instructions (Addendum)
Limit your sodium (Salt) intake  Please check your blood pressure on a regular basis.  If it is consistently greater than 150/90, please make an office appointment.  Return in 6 months for follow-up  Impingement Syndrome, Rotator Cuff, Bursitis with Rehab Impingement syndrome is a condition that involves inflammation of the tendons of the rotator cuff and the subacromial bursa, that causes pain in the shoulder. The rotator cuff consists of four tendons and muscles that control much of the shoulder and upper arm function. The subacromial bursa is a fluid filled sac that helps reduce friction between the rotator cuff and one of the bones of the shoulder (acromion). Impingement syndrome is usually an overuse injury that causes swelling of the bursa (bursitis), swelling of the tendon (tendonitis), and/or a tear of the tendon (strain). Strains are classified into three categories. Grade 1 strains cause pain, but the tendon is not lengthened. Grade 2 strains include a lengthened ligament, due to the ligament being stretched or partially ruptured. With grade 2 strains there is still function, although the function may be decreased. Grade 3 strains include a complete tear of the tendon or muscle, and function is usually impaired. SYMPTOMS   Pain around the shoulder, often at the outer portion of the upper arm.  Pain that gets worse with shoulder function, especially when reaching overhead or lifting.  Sometimes, aching when not using the arm.  Pain that wakes you up at night.  Sometimes, tenderness, swelling, warmth, or redness over the affected area.  Loss of strength.  Limited motion of the shoulder, especially reaching behind the back (to the back pocket or to unhook bra) or across your body.  Crackling sound (crepitation) when moving the arm.  Biceps tendon pain and inflammation (in the front of the shoulder). Worse when bending the elbow or lifting. CAUSES  Impingement syndrome is often an  overuse injury, in which chronic (repetitive) motions cause the tendons or bursa to become inflamed. A strain occurs when a force is paced on the tendon or muscle that is greater than it can withstand. Common mechanisms of injury include: Stress from sudden increase in duration, frequency, or intensity of training.  Direct hit (trauma) to the shoulder.  Aging, erosion of the tendon with normal use.  Bony bump on shoulder (acromial spur). RISK INCREASES WITH:  Contact sports (football, wrestling, boxing).  Throwing sports (baseball, tennis, volleyball).  Weightlifting and bodybuilding.  Heavy labor.  Previous injury to the rotator cuff, including impingement.  Poor shoulder strength and flexibility.  Failure to warm up properly before activity.  Inadequate protective equipment.  Old age.  Bony bump on shoulder (acromial spur). PREVENTION   Warm up and stretch properly before activity.  Allow for adequate recovery between workouts.  Maintain physical fitness:  Strength, flexibility, and endurance.  Cardiovascular fitness.  Learn and use proper exercise technique. PROGNOSIS  If treated properly, impingement syndrome usually goes away within 6 weeks. Sometimes surgery is required.  RELATED COMPLICATIONS   Longer healing time if not properly treated, or if not given enough time to heal.  Recurring symptoms, that result in a chronic condition.  Shoulder stiffness, frozen shoulder, or loss of motion.  Rotator cuff tendon tear.  Recurring symptoms, especially if activity is resumed too soon, with overuse, with a direct blow, or when using poor technique. TREATMENT  Treatment first involves the use of ice and medicine, to reduce pain and inflammation. The use of strengthening and stretching exercises may help reduce pain with activity.  These exercises may be performed at home or with a therapist. If non-surgical treatment is unsuccessful after more than 6 months,  surgery may be advised. After surgery and rehabilitation, activity is usually possible in 3 months.  MEDICATION  If pain medicine is needed, nonsteroidal anti-inflammatory medicines (aspirin and ibuprofen), or other minor pain relievers (acetaminophen), are often advised.  Do not take pain medicine for 7 days before surgery.  Prescription pain relievers may be given, if your caregiver thinks they are needed. Use only as directed and only as much as you need.  Corticosteroid injections may be given by your caregiver. These injections should be reserved for the most serious cases, because they may only be given a certain number of times. HEAT AND COLD  Cold treatment (icing) should be applied for 10 to 15 minutes every 2 to 3 hours for inflammation and pain, and immediately after activity that aggravates your symptoms. Use ice packs or an ice massage.  Heat treatment may be used before performing stretching and strengthening activities prescribed by your caregiver, physical therapist, or athletic trainer. Use a heat pack or a warm water soak. SEEK MEDICAL CARE IF:   Symptoms get worse or do not improve in 4 to 6 weeks, despite treatment.  New, unexplained symptoms develop. (Drugs used in treatment may produce side effects.) EXERCISES  RANGE OF MOTION (ROM) AND STRETCHING EXERCISES - Impingement Syndrome (Rotator Cuff  Tendinitis, Bursitis) These exercises may help you when beginning to rehabilitate your injury. Your symptoms may go away with or without further involvement from your physician, physical therapist or athletic trainer. While completing these exercises, remember:   Restoring tissue flexibility helps normal motion to return to the joints. This allows healthier, less painful movement and activity.  An effective stretch should be held for at least 30 seconds.  A stretch should never be painful. You should only feel a gentle lengthening or release in the stretched tissue. STRETCH  - Flexion, Standing  Stand with good posture. With an underhand grip on your right / left hand, and an overhand grip on the opposite hand, grasp a broomstick or cane so that your hands are a little more than shoulder width apart.  Keeping your right / left elbow straight and shoulder muscles relaxed, push the stick with your opposite hand, to raise your right / left arm in front of your body and then overhead. Raise your arm until you feel a stretch in your right / left shoulder, but before you have increased shoulder pain.  Try to avoid shrugging your right / left shoulder as your arm rises, by keeping your shoulder blade tucked down and toward your mid-back spine. Hold for __________ seconds.  Slowly return to the starting position. Repeat __________ times. Complete this exercise __________ times per day. STRETCH - Abduction, Supine  Lie on your back. With an underhand grip on your right / left hand and an overhand grip on the opposite hand, grasp a broomstick or cane so that your hands are a little more than shoulder width apart.  Keeping your right / left elbow straight and your shoulder muscles relaxed, push the stick with your opposite hand, to raise your right / left arm out to the side of your body and then overhead. Raise your arm until you feel a stretch in your right / left shoulder, but before you have increased shoulder pain.  Try to avoid shrugging your right / left shoulder as your arm rises, by keeping your shoulder blade  tucked down and toward your mid-back spine. Hold for __________ seconds.  Slowly return to the starting position. Repeat __________ times. Complete this exercise __________ times per day. ROM - Flexion, Active-Assisted  Lie on your back. You may bend your knees for comfort.  Grasp a broomstick or cane so your hands are about shoulder width apart. Your right / left hand should grip the end of the stick, so that your hand is positioned "thumbs-up," as if you  were about to shake hands.  Using your healthy arm to lead, raise your right / left arm overhead, until you feel a gentle stretch in your shoulder. Hold for __________ seconds.  Use the stick to assist in returning your right / left arm to its starting position. Repeat __________ times. Complete this exercise __________ times per day.  ROM - Internal Rotation, Supine   Lie on your back on a firm surface. Place your right / left elbow about 60 degrees away from your side. Elevate your elbow with a folded towel, so that the elbow and shoulder are the same height.  Using a broomstick or cane and your strong arm, pull your right / left hand toward your body until you feel a gentle stretch, but no increase in your shoulder pain. Keep your shoulder and elbow in place throughout the exercise.  Hold for __________ seconds. Slowly return to the starting position. Repeat __________ times. Complete this exercise __________ times per day. STRETCH - Internal Rotation  Place your right / left hand behind your back, palm up.  Throw a towel or belt over your opposite shoulder. Grasp the towel with your right / left hand.  While keeping an upright posture, gently pull up on the towel, until you feel a stretch in the front of your right / left shoulder.  Avoid shrugging your right / left shoulder as your arm rises, by keeping your shoulder blade tucked down and toward your mid-back spine.  Hold for __________ seconds. Release the stretch, by lowering your healthy hand. Repeat __________ times. Complete this exercise __________ times per day. ROM - Internal Rotation   Using an underhand grip, grasp a stick behind your back with both hands.  While standing upright with good posture, slide the stick up your back until you feel a mild stretch in the front of your shoulder.  Hold for __________ seconds. Slowly return to your starting position. Repeat __________ times. Complete this exercise __________  times per day.  STRETCH - Posterior Shoulder Capsule   Stand or sit with good posture. Grasp your right / left elbow and draw it across your chest, keeping it at the same height as your shoulder.  Pull your elbow, so your upper arm comes in closer to your chest. Pull until you feel a gentle stretch in the back of your shoulder.  Hold for __________ seconds. Repeat __________ times. Complete this exercise __________ times per day. STRENGTHENING EXERCISES - Impingement Syndrome (Rotator Cuff Tendinitis, Bursitis) These exercises may help you when beginning to rehabilitate your injury. They may resolve your symptoms with or without further involvement from your physician, physical therapist or athletic trainer. While completing these exercises, remember:  Muscles can gain both the endurance and the strength needed for everyday activities through controlled exercises.  Complete these exercises as instructed by your physician, physical therapist or athletic trainer. Increase the resistance and repetitions only as guided.  You may experience muscle soreness or fatigue, but the pain or discomfort you are trying to eliminate  should never worsen during these exercises. If this pain does get worse, stop and make sure you are following the directions exactly. If the pain is still present after adjustments, discontinue the exercise until you can discuss the trouble with your clinician.  During your recovery, avoid activity or exercises which involve actions that place your injured hand or elbow above your head or behind your back or head. These positions stress the tissues which you are trying to heal. STRENGTH - Scapular Depression and Adduction   With good posture, sit on a firm chair. Support your arms in front of you, with pillows, arm rests, or on a table top. Have your elbows in line with the sides of your body.  Gently draw your shoulder blades down and toward your mid-back spine. Gradually  increase the tension, without tensing the muscles along the top of your shoulders and the back of your neck.  Hold for __________ seconds. Slowly release the tension and relax your muscles completely before starting the next repetition.  After you have practiced this exercise, remove the arm support and complete the exercise in standing as well as sitting position. Repeat __________ times. Complete this exercise __________ times per day.  STRENGTH - Shoulder Abductors, Isometric  With good posture, stand or sit about 4-6 inches from a wall, with your right / left side facing the wall.  Bend your right / left elbow. Gently press your right / left elbow into the wall. Increase the pressure gradually, until you are pressing as hard as you can, without shrugging your shoulder or increasing any shoulder discomfort.  Hold for __________ seconds.  Release the tension slowly. Relax your shoulder muscles completely before you begin the next repetition. Repeat __________ times. Complete this exercise __________ times per day.  STRENGTH - External Rotators, Isometric  Keep your right / left elbow at your side and bend it 90 degrees.  Step into a door frame so that the outside of your right / left wrist can press against the door frame without your upper arm leaving your side.  Gently press your right / left wrist into the door frame, as if you were trying to swing the back of your hand away from your stomach. Gradually increase the tension, until you are pressing as hard as you can, without shrugging your shoulder or increasing any shoulder discomfort.  Hold for __________ seconds.  Release the tension slowly. Relax your shoulder muscles completely before you begin the next repetition. Repeat __________ times. Complete this exercise __________ times per day.  STRENGTH - Supraspinatus   Stand or sit with good posture. Grasp a __________ weight, or an exercise band or tubing, so that your hand is  "thumbs-up," like you are shaking hands.  Slowly lift your right / left arm in a "V" away from your thigh, diagonally into the space between your side and straight ahead. Lift your hand to shoulder height or as far as you can, without increasing any shoulder pain. At first, many people do not lift their hands above shoulder height.  Avoid shrugging your right / left shoulder as your arm rises, by keeping your shoulder blade tucked down and toward your mid-back spine.  Hold for __________ seconds. Control the descent of your hand, as you slowly return to your starting position. Repeat __________ times. Complete this exercise __________ times per day.  STRENGTH - External Rotators  Secure a rubber exercise band or tubing to a fixed object (table, pole) so that it is  at the same height as your right / left elbow when you are standing or sitting on a firm surface.  Stand or sit so that the secured exercise band is at your uninjured side.  Bend your right / left elbow 90 degrees. Place a folded towel or small pillow under your right / left arm, so that your elbow is a few inches away from your side.  Keeping the tension on the exercise band, pull it away from your body, as if pivoting on your elbow. Be sure to keep your body steady, so that the movement is coming only from your rotating shoulder.  Hold for __________ seconds. Release the tension in a controlled manner, as you return to the starting position. Repeat __________ times. Complete this exercise __________ times per day.  STRENGTH - Internal Rotators   Secure a rubber exercise band or tubing to a fixed object (table, pole) so that it is at the same height as your right / left elbow when you are standing or sitting on a firm surface.  Stand or sit so that the secured exercise band is at your right / left side.  Bend your elbow 90 degrees. Place a folded towel or small pillow under your right / left arm so that your elbow is a few inches  away from your side.  Keeping the tension on the exercise band, pull it across your body, toward your stomach. Be sure to keep your body steady, so that the movement is coming only from your rotating shoulder.  Hold for __________ seconds. Release the tension in a controlled manner, as you return to the starting position. Repeat __________ times. Complete this exercise __________ times per day.  STRENGTH - Scapular Protractors, Standing   Stand arms length away from a wall. Place your hands on the wall, keeping your elbows straight.  Begin by dropping your shoulder blades down and toward your mid-back spine.  To strengthen your protractors, keep your shoulder blades down, but slide them forward on your rib cage. It will feel as if you are lifting the back of your rib cage away from the wall. This is a subtle motion and can be challenging to complete. Ask your caregiver for further instruction, if you are not sure you are doing the exercise correctly.  Hold for __________ seconds. Slowly return to the starting position, resting the muscles completely before starting the next repetition. Repeat __________ times. Complete this exercise __________ times per day. STRENGTH - Scapular Protractors, Supine  Lie on your back on a firm surface. Extend your right / left arm straight into the air while holding a __________ weight in your hand.  Keeping your head and back in place, lift your shoulder off the floor.  Hold for __________ seconds. Slowly return to the starting position, and allow your muscles to relax completely before starting the next repetition. Repeat __________ times. Complete this exercise __________ times per day. STRENGTH - Scapular Protractors, Quadruped  Get onto your hands and knees, with your shoulders directly over your hands (or as close as you can be, comfortably).  Keeping your elbows locked, lift the back of your rib cage up into your shoulder blades, so your mid-back  rounds out. Keep your neck muscles relaxed.  Hold this position for __________ seconds. Slowly return to the starting position and allow your muscles to relax completely before starting the next repetition. Repeat __________ times. Complete this exercise __________ times per day.  STRENGTH - Scapular Retractors  Secure  a rubber exercise band or tubing to a fixed object (table, pole), so that it is at the height of your shoulders when you are either standing, or sitting on a firm armless chair.  With a palm down grip, grasp an end of the band in each hand. Straighten your elbows and lift your hands straight in front of you, at shoulder height. Step back, away from the secured end of the band, until it becomes tense.  Squeezing your shoulder blades together, draw your elbows back toward your sides, as you bend them. Keep your upper arms lifted away from your body throughout the exercise.  Hold for __________ seconds. Slowly ease the tension on the band, as you reverse the directions and return to the starting position. Repeat __________ times. Complete this exercise __________ times per day. STRENGTH - Shoulder Extensors   Secure a rubber exercise band or tubing to a fixed object (table, pole) so that it is at the height of your shoulders when you are either standing, or sitting on a firm armless chair.  With a thumbs-up grip, grasp an end of the band in each hand. Straighten your elbows and lift your hands straight in front of you, at shoulder height. Step back, away from the secured end of the band, until it becomes tense.  Squeezing your shoulder blades together, pull your hands down to the sides of your thighs. Do not allow your hands to go behind you.  Hold for __________ seconds. Slowly ease the tension on the band, as you reverse the directions and return to the starting position. Repeat __________ times. Complete this exercise __________ times per day.  STRENGTH - Scapular Retractors  and External Rotators   Secure a rubber exercise band or tubing to a fixed object (table, pole) so that it is at the height as your shoulders, when you are either standing, or sitting on a firm armless chair.  With a palm down grip, grasp an end of the band in each hand. Bend your elbows 90 degrees and lift your elbows to shoulder height, at your sides. Step back, away from the secured end of the band, until it becomes tense.  Squeezing your shoulder blades together, rotate your shoulders so that your upper arms and elbows remain stationary, but your fists travel upward to head height.  Hold for __________ seconds. Slowly ease the tension on the band, as you reverse the directions and return to the starting position. Repeat __________ times. Complete this exercise __________ times per day.  STRENGTH - Scapular Retractors and External Rotators, Rowing   Secure a rubber exercise band or tubing to a fixed object (table, pole) so that it is at the height of your shoulders, when you are either standing, or sitting on a firm armless chair.  With a palm down grip, grasp an end of the band in each hand. Straighten your elbows and lift your hands straight in front of you, at shoulder height. Step back, away from the secured end of the band, until it becomes tense.  Step 1: Squeeze your shoulder blades together. Bending your elbows, draw your hands to your chest, as if you are rowing a boat. At the end of this motion, your hands and elbow should be at shoulder height and your elbows should be out to your sides.  Step 2: Rotate your shoulders, to raise your hands above your head. Your forearms should be vertical and your upper arms should be horizontal.  Hold for __________ seconds. Slowly  ease the tension on the band, as you reverse the directions and return to the starting position. Repeat __________ times. Complete this exercise __________ times per day.  STRENGTH - Scapular Depressors  Find a sturdy  chair without wheels, such as a dining room chair.  Keeping your feet on the floor, and your hands on the chair arms, lift your bottom up from the seat, and lock your elbows.  Keeping your elbows straight, allow gravity to pull your body weight down. Your shoulders will rise toward your ears.  Raise your body against gravity by drawing your shoulder blades down your back, shortening the distance between your shoulders and ears. Although your feet should always maintain contact with the floor, your feet should progressively support less body weight, as you get stronger.  Hold for __________ seconds. In a controlled and slow manner, lower your body weight to begin the next repetition. Repeat __________ times. Complete this exercise __________ times per day.  Document Released: 09/05/2005 Document Revised: 11/28/2011 Document Reviewed: 12/18/2008 [facility] Patient Information 2015 [place], [place]. This information is not intended to replace advice given to you by your health care provider. Make sure you discuss any questions you have with your health care provider.

## 2015-08-16 ENCOUNTER — Other Ambulatory Visit: Payer: Self-pay | Admitting: Internal Medicine

## 2015-08-28 ENCOUNTER — Other Ambulatory Visit: Payer: Self-pay | Admitting: Internal Medicine

## 2015-09-12 ENCOUNTER — Other Ambulatory Visit: Payer: Self-pay | Admitting: Internal Medicine

## 2015-11-13 ENCOUNTER — Encounter: Payer: Self-pay | Admitting: Family Medicine

## 2015-11-13 ENCOUNTER — Ambulatory Visit (INDEPENDENT_AMBULATORY_CARE_PROVIDER_SITE_OTHER): Payer: PPO | Admitting: Family Medicine

## 2015-11-13 VITALS — BP 110/80 | HR 76 | Temp 99.1°F | Ht 70.5 in | Wt 200.0 lb

## 2015-11-13 DIAGNOSIS — K219 Gastro-esophageal reflux disease without esophagitis: Secondary | ICD-10-CM

## 2015-11-13 DIAGNOSIS — R509 Fever, unspecified: Secondary | ICD-10-CM

## 2015-11-13 DIAGNOSIS — J111 Influenza due to unidentified influenza virus with other respiratory manifestations: Secondary | ICD-10-CM

## 2015-11-13 LAB — POCT INFLUENZA A/B: INFLUENZA A, POC: POSITIVE — AB

## 2015-11-13 MED ORDER — OSELTAMIVIR PHOSPHATE 75 MG PO CAPS: 75.0000 mg | ORAL_CAPSULE | Freq: Two times a day (BID) | ORAL | Status: DC

## 2015-11-13 NOTE — Progress Notes (Signed)
   Subjective:    Patient ID: Jared [name], male    DOB: 03-13-1941, 75 y.o.   MRN: [id number]  HPI  acute visit for upper respiratory illness.  Onset 2 days ago sore throat, body aches, cough , and possible low-grade fever.  Temperature not actually taken. Cough has been mostly productive of clear sputum  denies any nausea or vomiting.  No sick contacts.   Patient also relates for at least 2 years a frequency has to "clear his throat ". He does have frequent reflux and currently only takes Prilosec 10 mg daily. Still has breakthrough reflux symptoms frequently.  Denies any chronic hoarseness  Past Medical History  Diagnosis Date  . ALLERGIC RHINITIS 10/05/2007  . ANXIETY 10/05/2007  . BENIGN PROSTATIC HYPERTROPHY 04/08/2009  . COLONIC POLYPS, HX OF 10/05/2007  . DEPRESSION 10/05/2007  . GERD 10/05/2007  . HYPERTENSION 10/05/2007  . NEPHROLITHIASIS, HX OF 10/05/2007  . SLEEP APNEA, OBSTRUCTIVE 10/02/2008  . Heart murmur    Past Surgical History  Procedure Laterality Date  . Appendectomy    . Cholecystectomy    . Wrist fracture surgery      right x2   left x1  . Foot surgery      left  . Cataract extraction  2014    bilateral  . Hernia repair  2014    reports that he has never smoked. He has never used smokeless tobacco. He reports that he does not drink alcohol or use illicit drugs. family history includes Colon cancer (age of onset: 46) in his maternal aunt. No Known Allergies    Review of Systems  Constitutional: Positive for fatigue. Negative for chills.  HENT: Positive for sore throat.   Respiratory: Positive for cough.   Gastrointestinal: Negative for nausea and vomiting.  Genitourinary: Negative for dysuria.  Musculoskeletal: Positive for myalgias.       Objective:   Physical Exam  Constitutional: He appears well-developed and well-nourished. No distress.  HENT:  Right Ear: External ear normal.  Left Ear: External ear normal.  Mouth/Throat: Oropharynx is clear  and moist.  Neck: Neck supple.  Cardiovascular: Normal rate and regular rhythm.   Pulmonary/Chest: Effort normal and breath sounds normal. No respiratory distress. He has no wheezes. He has no rales.  Lymphadenopathy:    He has no cervical adenopathy.  Skin: No rash noted.          Assessment & Plan:   #1 febrile illness. Rule out influenza. Rapid flu screen obtained.   Flu screen positive. Start Tamiflu 75 mg twice daily for 5 days. Push fluids. Tylenol or Advil as needed for symptom relief   #2  GERD. Consider increasing omeprazole to 40 mg daily for the next month. If symptoms not improving with that, recommend follow-up with primary

## 2015-11-13 NOTE — Progress Notes (Signed)
Pre visit review using our clinic review tool, if applicable. No additional management support is needed unless otherwise documented below in the visit note. 

## 2015-11-13 NOTE — Patient Instructions (Signed)
Influenza, Adult Influenza ("the flu") is a viral infection of the respiratory tract. It occurs more often in winter months because people spend more time in close contact with one another. Influenza can make you feel very sick. Influenza easily spreads from person to person (contagious). CAUSES  Influenza is caused by a virus that infects the respiratory tract. You can catch the virus by breathing in droplets from an infected person's cough or sneeze. You can also catch the virus by touching something that was recently contaminated with the virus and then touching your mouth, nose, or eyes. RISKS AND COMPLICATIONS You may be at risk for a more severe case of influenza if you smoke cigarettes, have diabetes, have chronic heart disease (such as heart failure) or lung disease (such as asthma), or if you have a weakened immune system. Elderly people and pregnant women are also at risk for more serious infections. The most common problem of influenza is a lung infection (pneumonia). Sometimes, this problem can require emergency medical care and may be life threatening. SIGNS AND SYMPTOMS  Symptoms typically last 4 to 10 days and may include:  Fever.  Chills.  Headache, body aches, and muscle aches.  Sore throat.  Chest discomfort and cough.  Poor appetite.  Weakness or feeling tired.  Dizziness.  Nausea or vomiting. DIAGNOSIS  Diagnosis of influenza is often made based on your history and a physical exam. A nose or throat swab test can be done to confirm the diagnosis. TREATMENT  In mild cases, influenza goes away on its own. Treatment is directed at relieving symptoms. For more severe cases, your health care provider may prescribe antiviral medicines to shorten the sickness. Antibiotic medicines are not effective because the infection is caused by a virus, not by bacteria. HOME CARE INSTRUCTIONS  Take medicines only as directed by your health care provider.  Use a cool mist humidifier  to make breathing easier.  Get plenty of rest until your temperature returns to normal. This usually takes 3 to 4 days.  Drink enough fluid to keep your urine clear or pale yellow.  Cover yourmouth and nosewhen coughing or sneezing,and wash your handswellto prevent thevirusfrom spreading.  Stay homefromwork orschool untilthe fever is gonefor at least 7full day. PREVENTION  An annual influenza vaccination (flu shot) is the best way to avoid getting influenza. An annual flu shot is now routinely recommended for all adults in the [place] IF:  You experiencechest pain, yourcough worsens,or you producemore mucus.  Youhave nausea,vomiting, ordiarrhea.  Your fever returns or gets worse. SEEK IMMEDIATE MEDICAL CARE IF:  You havetrouble breathing, you become short of breath,or your skin ornails becomebluish.  You have severe painor stiffnessin the neck.  You develop a sudden headache, or pain in the face or ear.  You have nausea or vomiting that you cannot control. MAKE SURE YOU:   Understand these instructions.  Will watch your condition.  Will get help right away if you are not doing well or get worse.   This information is not intended to replace advice given to you by your health care provider. Make sure you discuss any questions you have with your health care provider.   Document Released: 09/02/2000 Document Revised: 09/26/2014 Document Reviewed: 12/05/2011 Elsevier Interactive Patient Education [employer].   consider increasing omeprazole to 40 mg daily for the next month.  If throat symptoms not clearing with  that, follow-up with primary

## 2015-11-16 ENCOUNTER — Encounter: Payer: Self-pay | Admitting: Internal Medicine

## 2015-11-16 ENCOUNTER — Ambulatory Visit (INDEPENDENT_AMBULATORY_CARE_PROVIDER_SITE_OTHER): Payer: PPO | Admitting: Internal Medicine

## 2015-11-16 VITALS — BP 158/90 | HR 53 | Temp 98.7°F | Resp 20 | Ht 70.5 in | Wt 200.0 lb

## 2015-11-16 DIAGNOSIS — J09X2 Influenza due to identified novel influenza A virus with other respiratory manifestations: Secondary | ICD-10-CM

## 2015-11-16 DIAGNOSIS — I10 Essential (primary) hypertension: Secondary | ICD-10-CM

## 2015-11-16 DIAGNOSIS — J309 Allergic rhinitis, unspecified: Secondary | ICD-10-CM | POA: Diagnosis not present

## 2015-11-16 NOTE — Progress Notes (Signed)
Subjective:    Patient ID: Jared [name], male    DOB: [birth year], 75 y.o.   MRN: [id number]  HPI 75 year old patient who is seen today in follow-up.  He was seen 3 days ago and diagnosed and treated for influenza a.  He continues to have sore throat, fatigue.  He does have a prior history of allergic rhinitis.  He describes a tickling and foreign body sensation in the throat that leads to frequent paroxysms of coughing No fever, chills  Past Medical History  Diagnosis Date  . ALLERGIC RHINITIS 10/05/2007  . ANXIETY 10/05/2007  . BENIGN PROSTATIC HYPERTROPHY 04/08/2009  . COLONIC POLYPS, HX OF 10/05/2007  . DEPRESSION 10/05/2007  . GERD 10/05/2007  . HYPERTENSION 10/05/2007  . NEPHROLITHIASIS, HX OF 10/05/2007  . SLEEP APNEA, OBSTRUCTIVE 10/02/2008  . Heart murmur     Social History   Social History  . Marital Status: Widowed    Spouse Name: N/A  . Number of Children: N/A  . Years of Education: N/A   Occupational History  . Not on file.   Social History Main Topics  . Smoking status: Never Smoker   . Smokeless tobacco: Never Used  . Alcohol Use: No  . Drug Use: No  . Sexual Activity: Not on file   Other Topics Concern  . Not on file   Social History Narrative    Past Surgical History  Procedure Laterality Date  . Appendectomy    . Cholecystectomy    . Wrist fracture surgery      right x2   left x1  . Foot surgery      left  . Cataract extraction  2014    bilateral  . Hernia repair  2014    Family History  Problem Relation Age of Onset  . Colon cancer Maternal Aunt 70    No Known Allergies  Current Outpatient Prescriptions on File Prior to Visit  Medication Sig Dispense Refill  . ALPRAZolam (XANAX) 1 MG tablet Take 1 mg by mouth 2 (two) times daily.     Marland Kitchen amLODipine (NORVASC) 10 MG tablet TAKE 1 TABLET BY MOUTH EVERY DAY 90 tablet 1  . aspirin 81 MG tablet Take 81 mg by mouth daily.      Marland Kitchen buPROPion (WELLBUTRIN SR) 150 MG 12 hr tablet Take 150 mg by mouth  2 (two) times daily.    Marland Kitchen losartan-hydrochlorothiazide (HYZAAR) 100-25 MG tablet TAKE 1 TABLET BY MOUTH EVERY DAY 90 tablet 0  . omeprazole (PRILOSEC) 10 MG capsule TAKE ONE CAPSULE BY MOUTH EVERY DAY 90 capsule 3  . traZODone (DESYREL) 100 MG tablet      No current facility-administered medications on file prior to visit.    BP 158/90 mmHg  Pulse 53  Temp(Src) 98.7 F (37.1 C) (Oral)  Resp 20  Ht 5' 10.5" (1.791 m)  Wt 200 lb (90.719 kg)  BMI 28.28 kg/m2  SpO2 97%      Review of Systems  Constitutional: Positive for activity change, appetite change and fatigue. Negative for fever and chills.  HENT: Positive for postnasal drip. Negative for congestion, dental problem, ear pain, hearing loss, sore throat, tinnitus, trouble swallowing and voice change.   Eyes: Negative for pain, discharge and visual disturbance.  Respiratory: Negative for cough, chest tightness, wheezing and stridor.   Cardiovascular: Negative for chest pain, palpitations and leg swelling.  Gastrointestinal: Negative for nausea, vomiting, abdominal pain, diarrhea, constipation, blood in stool and abdominal distention.  Genitourinary: Negative for  urgency, hematuria, flank pain, discharge, difficulty urinating and genital sores.  Musculoskeletal: Negative for myalgias, back pain, joint swelling, arthralgias, gait problem and neck stiffness.  Skin: Negative for rash.  Neurological: Negative for dizziness, syncope, speech difficulty, weakness, numbness and headaches.  Hematological: Negative for adenopathy. Does not bruise/bleed easily.  Psychiatric/Behavioral: Negative for behavioral problems and dysphoric mood. The patient is not nervous/anxious.        Objective:   Physical Exam  Constitutional: He is oriented to person, place, and time. He appears well-developed.  HENT:  Head: Normocephalic.  Right Ear: External ear normal.  Left Ear: External ear normal.  Oral pharynx minimally injected  Eyes:  Conjunctivae and EOM are normal.  Neck: Normal range of motion.  Cardiovascular: Normal rate and normal heart sounds.   Pulmonary/Chest: Breath sounds normal.  Abdominal: Bowel sounds are normal.  Musculoskeletal: Normal range of motion. He exhibits no edema or tenderness.  Neurological: He is alert and oriented to person, place, and time.  Psychiatric: He has a normal mood and affect. His behavior is normal.          Assessment & Plan:   Flu syndrome.  Continue 5 days of Tamiflu Continue symptomatic treatment

## 2015-11-16 NOTE — Progress Notes (Signed)
Pre visit review using our clinic review tool, if applicable. No additional management support is needed unless otherwise documented below in the visit note. 

## 2015-11-16 NOTE — Patient Instructions (Signed)
HOME CARE INSTRUCTIONS  Drink plenty of water. Water helps thin the mucus so your sinuses can drain more easily.  Use a humidifier.  Inhale steam 3-4 times a day (for example, sit in the bathroom with the shower running).  Apply a warm, moist washcloth to your face 3-4 times a day, or as directed by your health care provider.  Use saline nasal sprays to help moisten and clean your sinuses.  Take medicines only as directed by your health care provider.   If Postnasal drip is significant, use a nonsedating antihistamine such as Allegra or Claritin; consider continuing on fluticasone nasal spray daily

## 2015-12-04 DIAGNOSIS — L821 Other seborrheic keratosis: Secondary | ICD-10-CM | POA: Diagnosis not present

## 2015-12-04 DIAGNOSIS — D225 Melanocytic nevi of trunk: Secondary | ICD-10-CM | POA: Diagnosis not present

## 2015-12-04 DIAGNOSIS — L812 Freckles: Secondary | ICD-10-CM | POA: Diagnosis not present

## 2015-12-04 DIAGNOSIS — L570 Actinic keratosis: Secondary | ICD-10-CM | POA: Diagnosis not present

## 2015-12-15 ENCOUNTER — Other Ambulatory Visit: Payer: Self-pay | Admitting: Internal Medicine

## 2015-12-31 DIAGNOSIS — S52614A Nondisplaced fracture of right ulna styloid process, initial encounter for closed fracture: Secondary | ICD-10-CM | POA: Diagnosis not present

## 2016-01-29 DIAGNOSIS — S52614D Nondisplaced fracture of right ulna styloid process, subsequent encounter for closed fracture with routine healing: Secondary | ICD-10-CM | POA: Diagnosis not present

## 2016-02-05 ENCOUNTER — Other Ambulatory Visit: Payer: Self-pay | Admitting: Internal Medicine

## 2016-03-03 ENCOUNTER — Other Ambulatory Visit: Payer: Self-pay | Admitting: Internal Medicine

## 2016-03-08 DIAGNOSIS — H401121 Primary open-angle glaucoma, left eye, mild stage: Secondary | ICD-10-CM | POA: Diagnosis not present

## 2016-03-08 DIAGNOSIS — H401112 Primary open-angle glaucoma, right eye, moderate stage: Secondary | ICD-10-CM | POA: Diagnosis not present

## 2016-03-10 ENCOUNTER — Other Ambulatory Visit: Payer: Self-pay | Admitting: Internal Medicine

## 2016-03-11 DIAGNOSIS — M170 Bilateral primary osteoarthritis of knee: Secondary | ICD-10-CM | POA: Diagnosis not present

## 2016-03-11 DIAGNOSIS — M25561 Pain in right knee: Secondary | ICD-10-CM | POA: Diagnosis not present

## 2016-03-25 ENCOUNTER — Other Ambulatory Visit: Payer: Self-pay | Admitting: *Deleted

## 2016-03-25 MED ORDER — FINASTERIDE 5 MG PO TABS: 5.0000 mg | ORAL_TABLET | Freq: Every day | ORAL | Status: DC

## 2016-05-02 ENCOUNTER — Other Ambulatory Visit: Payer: Self-pay | Admitting: Internal Medicine

## 2016-06-09 ENCOUNTER — Other Ambulatory Visit: Payer: Self-pay | Admitting: Internal Medicine

## 2016-06-14 ENCOUNTER — Other Ambulatory Visit: Payer: Self-pay | Admitting: Internal Medicine

## 2016-06-20 ENCOUNTER — Other Ambulatory Visit: Payer: Self-pay | Admitting: Internal Medicine

## 2016-07-28 ENCOUNTER — Other Ambulatory Visit: Payer: Self-pay | Admitting: Internal Medicine

## 2016-09-13 ENCOUNTER — Other Ambulatory Visit: Payer: Self-pay | Admitting: Internal Medicine

## 2016-09-20 ENCOUNTER — Ambulatory Visit (INDEPENDENT_AMBULATORY_CARE_PROVIDER_SITE_OTHER): Payer: PPO | Admitting: Family Medicine

## 2016-09-30 ENCOUNTER — Other Ambulatory Visit (INDEPENDENT_AMBULATORY_CARE_PROVIDER_SITE_OTHER): Payer: PPO

## 2016-09-30 DIAGNOSIS — Z0000 Encounter for general adult medical examination without abnormal findings: Secondary | ICD-10-CM

## 2016-09-30 LAB — CBC WITH DIFFERENTIAL/PLATELET
BASOS ABS: 0 10*3/uL (ref 0.0–0.1)
Basophils Relative: 0.8 % (ref 0.0–3.0)
EOS ABS: 0.2 10*3/uL (ref 0.0–0.7)
Eosinophils Relative: 5.4 % — ABNORMAL HIGH (ref 0.0–5.0)
HEMATOCRIT: 40.5 % (ref 39.0–52.0)
Hemoglobin: 14 g/dL (ref 13.0–17.0)
LYMPHS PCT: 39.8 % (ref 12.0–46.0)
Lymphs Abs: 1.8 10*3/uL (ref 0.7–4.0)
MCHC: 34.7 g/dL (ref 30.0–36.0)
MCV: 89.3 fl (ref 78.0–100.0)
Monocytes Absolute: 0.5 10*3/uL (ref 0.1–1.0)
Monocytes Relative: 11 % (ref 3.0–12.0)
Neutro Abs: 1.9 10*3/uL (ref 1.4–7.7)
Neutrophils Relative %: 43 % (ref 43.0–77.0)
Platelets: 184 10*3/uL (ref 150.0–400.0)
RBC: 4.53 Mil/uL (ref 4.22–5.81)
RDW: 12.9 % (ref 11.5–15.5)
WBC: 4.4 10*3/uL (ref 4.0–10.5)

## 2016-09-30 LAB — LIPID PANEL
CHOLESTEROL: 155 mg/dL (ref 0–200)
HDL: 44.2 mg/dL (ref >39.00)
LDL Cholesterol: 91 mg/dL (ref 0–99)
NonHDL: 110.38
TRIGLYCERIDES: 98 mg/dL (ref 0.0–149.0)
Total CHOL/HDL Ratio: 3
VLDL: 19.6 mg/dL (ref 0.0–40.0)

## 2016-09-30 LAB — BASIC METABOLIC PANEL
BUN: 22 mg/dL (ref 6–23)
CALCIUM: 9.3 mg/dL (ref 8.4–10.5)
CO2: 33 mEq/L — ABNORMAL HIGH (ref 19–32)
CREATININE: 1.04 mg/dL (ref 0.40–1.50)
Chloride: 104 mEq/L (ref 96–112)
GFR: 74.21 mL/min (ref >60.00)
Glucose, Bld: 100 mg/dL — ABNORMAL HIGH (ref 70–99)
Potassium: 3.6 mEq/L (ref 3.5–5.1)
Sodium: 143 mEq/L (ref 135–145)

## 2016-09-30 LAB — HEPATIC FUNCTION PANEL
ALT: 29 U/L (ref 0–53)
AST: 21 U/L (ref 0–37)
Albumin: 3.8 g/dL (ref 3.5–5.2)
Alkaline Phosphatase: 74 U/L (ref 39–117)
Bilirubin, Direct: 0.1 mg/dL (ref 0.0–0.3)
Total Bilirubin: 0.4 mg/dL (ref 0.2–1.2)
Total Protein: 6.4 g/dL (ref 6.0–8.3)

## 2016-09-30 LAB — PSA: PSA: 0.99 ng/mL (ref 0.10–4.00)

## 2016-09-30 LAB — TSH: TSH: 2.19 u[IU]/mL (ref 0.35–4.50)

## 2016-10-07 ENCOUNTER — Encounter: Payer: PPO | Admitting: Family Medicine

## 2016-10-14 ENCOUNTER — Encounter: Payer: Self-pay | Admitting: Family Medicine

## 2016-10-14 ENCOUNTER — Ambulatory Visit (INDEPENDENT_AMBULATORY_CARE_PROVIDER_SITE_OTHER): Payer: PPO | Admitting: Family Medicine

## 2016-10-14 VITALS — BP 140/80 | HR 85 | Temp 97.9°F | Ht 70.5 in | Wt 190.4 lb

## 2016-10-14 DIAGNOSIS — Z0000 Encounter for general adult medical examination without abnormal findings: Secondary | ICD-10-CM

## 2016-10-14 DIAGNOSIS — R001 Bradycardia, unspecified: Secondary | ICD-10-CM | POA: Diagnosis not present

## 2016-10-14 NOTE — Progress Notes (Signed)
Subjective:     Patient ID: Jared [name], male   DOB: August 17, 1941, 76 y.o.   MRN: [id number]  HPI Patient seen for complete physical. He was scheduled with his primary last week but cancellation secondary to snow and he was rescheduled for some reason with me. He has history of recurrent depression, general anxiety, hypertension, GERD. Health maintenance up-to-date. Immunizations are up-to-date. Colonoscopy up-to-date. He also has history of BPH. He takes finasteride. Still has occasional slow stream and dribbling stream. No burning with urination.  He states he has rare episodes of dizziness when he first stands up. No syncope. No chest pains.  Retired Brewing technologist.  Past Medical History:  Diagnosis Date  . ALLERGIC RHINITIS 10/05/2007  . ANXIETY 10/05/2007  . BENIGN PROSTATIC HYPERTROPHY 04/08/2009  . COLONIC POLYPS, HX OF 10/05/2007  . DEPRESSION 10/05/2007  . GERD 10/05/2007  . Heart murmur   . HYPERTENSION 10/05/2007  . NEPHROLITHIASIS, HX OF 10/05/2007  . SLEEP APNEA, OBSTRUCTIVE 10/02/2008   Past Surgical History:  Procedure Laterality Date  . APPENDECTOMY    . CATARACT EXTRACTION  2014   bilateral  . CHOLECYSTECTOMY    . FOOT SURGERY     left  . HERNIA REPAIR  2014  . WRIST FRACTURE SURGERY     right x2   left x1    reports that he has never smoked. He has never used smokeless tobacco. He reports that he does not drink alcohol or use drugs. family history includes Colon cancer (age of onset: 54) in his maternal aunt. No Known Allergies   Review of Systems  Constitutional: Negative for appetite change, chills, fatigue and unexpected weight change.  Eyes: Negative for visual disturbance.  Respiratory: Negative for cough, chest tightness and shortness of breath.   Cardiovascular: Negative for chest pain, palpitations and leg swelling.  Gastrointestinal: Negative for abdominal pain.  Endocrine: Negative for polydipsia and polyuria.  Genitourinary: Negative for dysuria.   Neurological: Positive for dizziness and light-headedness. Negative for syncope, weakness and headaches.       Objective:   Physical Exam  Constitutional: He is oriented to person, place, and time. He appears well-developed and well-nourished. No distress.  HENT:  Head: Normocephalic and atraumatic.  Right Ear: External ear normal.  Left Ear: External ear normal.  Mouth/Throat: Oropharynx is clear and moist.  Eyes: Conjunctivae and EOM are normal. Pupils are equal, round, and reactive to light.  Neck: Normal range of motion. Neck supple. No thyromegaly present.  Cardiovascular: Regular rhythm and normal heart sounds.   No murmur heard. Patient had initial palpated heart rate of 46   Pulmonary/Chest: No respiratory distress. He has no wheezes. He has no rales.  Abdominal: Soft. Bowel sounds are normal. He exhibits no distension and no mass. There is no tenderness. There is no rebound and no guarding.  Musculoskeletal: He exhibits no edema.  Lymphadenopathy:    He has no cervical adenopathy.  Neurological: He is alert and oriented to person, place, and time. He displays normal reflexes. No cranial nerve deficit.  Skin: No rash noted.  Psychiatric: He has a normal mood and affect.       Assessment:     #1 complete physical. Health maintenance up-to-date. Labs reviewed and these look good overall. Lipids are improved compared to last visit  #2 sinus bradycardia. Initial palpated heart rate 46. In view of some recent lightheadedness we obtained EKG. This shows some PACs. No evidence for heart block.  Plan:     -He is encouraged to exercise more consistently  -Labs reviewed with no major concerns.   Discussed possible additional medications such as Flomax if urinary symptoms persist. He will discuss with his primary   Eulas Post MD [facility]  -

## 2016-10-14 NOTE — Patient Instructions (Signed)
Try to establish more consistent exercise. If you have any further episodes of dizziness, check your pulse and let Dr Raliegh Ip know if this is dipping < 45.

## 2016-10-14 NOTE — Progress Notes (Signed)
Pre visit review using our clinic review tool, if applicable. No additional management support is needed unless otherwise documented below in the visit note. 

## 2016-10-23 ENCOUNTER — Other Ambulatory Visit: Payer: Self-pay | Admitting: Internal Medicine

## 2016-12-02 DIAGNOSIS — L814 Other melanin hyperpigmentation: Secondary | ICD-10-CM | POA: Diagnosis not present

## 2016-12-02 DIAGNOSIS — L821 Other seborrheic keratosis: Secondary | ICD-10-CM | POA: Diagnosis not present

## 2016-12-02 DIAGNOSIS — L570 Actinic keratosis: Secondary | ICD-10-CM | POA: Diagnosis not present

## 2016-12-02 DIAGNOSIS — D225 Melanocytic nevi of trunk: Secondary | ICD-10-CM | POA: Diagnosis not present

## 2016-12-02 DIAGNOSIS — C4441 Basal cell carcinoma of skin of scalp and neck: Secondary | ICD-10-CM | POA: Diagnosis not present

## 2016-12-05 DIAGNOSIS — M1711 Unilateral primary osteoarthritis, right knee: Secondary | ICD-10-CM | POA: Diagnosis not present

## 2016-12-05 DIAGNOSIS — M79604 Pain in right leg: Secondary | ICD-10-CM | POA: Diagnosis not present

## 2016-12-08 ENCOUNTER — Other Ambulatory Visit: Payer: Self-pay | Admitting: Internal Medicine

## 2016-12-09 ENCOUNTER — Other Ambulatory Visit: Payer: Self-pay | Admitting: Internal Medicine

## 2016-12-09 DIAGNOSIS — L853 Xerosis cutis: Secondary | ICD-10-CM | POA: Diagnosis not present

## 2016-12-09 DIAGNOSIS — C4441 Basal cell carcinoma of skin of scalp and neck: Secondary | ICD-10-CM | POA: Diagnosis not present

## 2017-01-12 DIAGNOSIS — M4306 Spondylolysis, lumbar region: Secondary | ICD-10-CM | POA: Diagnosis not present

## 2017-01-12 DIAGNOSIS — M4686 Other specified inflammatory spondylopathies, lumbar region: Secondary | ICD-10-CM | POA: Diagnosis not present

## 2017-01-12 DIAGNOSIS — G8929 Other chronic pain: Secondary | ICD-10-CM | POA: Diagnosis not present

## 2017-01-12 DIAGNOSIS — M5441 Lumbago with sciatica, right side: Secondary | ICD-10-CM | POA: Diagnosis not present

## 2017-01-19 ENCOUNTER — Other Ambulatory Visit: Payer: Self-pay | Admitting: Internal Medicine

## 2017-01-20 DIAGNOSIS — L720 Epidermal cyst: Secondary | ICD-10-CM | POA: Diagnosis not present

## 2017-01-20 DIAGNOSIS — L570 Actinic keratosis: Secondary | ICD-10-CM | POA: Diagnosis not present

## 2017-01-20 DIAGNOSIS — Z85828 Personal history of other malignant neoplasm of skin: Secondary | ICD-10-CM | POA: Diagnosis not present

## 2017-01-21 DIAGNOSIS — M5441 Lumbago with sciatica, right side: Secondary | ICD-10-CM | POA: Diagnosis not present

## 2017-01-21 DIAGNOSIS — G8929 Other chronic pain: Secondary | ICD-10-CM | POA: Diagnosis not present

## 2017-01-30 DIAGNOSIS — M4306 Spondylolysis, lumbar region: Secondary | ICD-10-CM | POA: Diagnosis not present

## 2017-01-30 DIAGNOSIS — G8929 Other chronic pain: Secondary | ICD-10-CM | POA: Diagnosis not present

## 2017-01-30 DIAGNOSIS — M5441 Lumbago with sciatica, right side: Secondary | ICD-10-CM | POA: Diagnosis not present

## 2017-01-30 DIAGNOSIS — M4686 Other specified inflammatory spondylopathies, lumbar region: Secondary | ICD-10-CM | POA: Diagnosis not present

## 2017-02-02 DIAGNOSIS — M1711 Unilateral primary osteoarthritis, right knee: Secondary | ICD-10-CM | POA: Diagnosis not present

## 2017-02-06 ENCOUNTER — Encounter: Payer: Self-pay | Admitting: Internal Medicine

## 2017-02-06 ENCOUNTER — Ambulatory Visit (INDEPENDENT_AMBULATORY_CARE_PROVIDER_SITE_OTHER): Payer: PPO | Admitting: Internal Medicine

## 2017-02-06 VITALS — BP 128/68 | HR 54 | Temp 98.1°F | Ht 70.5 in | Wt 188.0 lb

## 2017-02-06 DIAGNOSIS — G8929 Other chronic pain: Secondary | ICD-10-CM | POA: Diagnosis not present

## 2017-02-06 DIAGNOSIS — I10 Essential (primary) hypertension: Secondary | ICD-10-CM | POA: Diagnosis not present

## 2017-02-06 DIAGNOSIS — M5441 Lumbago with sciatica, right side: Secondary | ICD-10-CM | POA: Diagnosis not present

## 2017-02-06 NOTE — Patient Instructions (Addendum)
WE NOW OFFER   [name] FAST TRACK!!!  SAME DAY Appointments for ACUTE CARE  Such as: Sprains, Injuries, cuts, abrasions, rashes, muscle pain, joint pain, back pain Colds, flu, sore throats, headache, allergies, cough, fever  Ear pain, sinus and eye infections Abdominal pain, nausea, vomiting, diarrhea, upset stomach Animal/insect bites  3 Easy Ways to Schedule: Walk-In Scheduling Call in scheduling Mychart Sign-up: [email or web address]   Limit your sodium (Salt) intake  Please check your blood pressure on a regular basis.  If it is consistently greater than 150/90, please make an office appointment.    It is important that you exercise regularly, at least 20 minutes 3 to 4 times per week.  If you develop chest pain or shortness of breath seek  medical attention.  Return for annual exam January 2019

## 2017-02-06 NOTE — Progress Notes (Signed)
Subjective:    Patient ID: Jared [name], male    DOB: [birth year], 76 y.o.   MRN: [id number]  HPI 76 year old patient who is seen today in follow-up. He has been seen by [facility] orthopedics and had a lumbar MRI performed.  This revealed some benign renal cysts and also a slightly post cholecystectomy.  Dilated bile duct.  Patient feels quite well  Wt Readings from Last 3 Encounters:  02/06/17 188 lb (85.3 kg)  10/14/16 190 lb 6.4 oz (86.4 kg)  11/16/15 200 lb (90.7 kg)   Results of MRI reviewed  Past Medical History:  Diagnosis Date  . ALLERGIC RHINITIS 10/05/2007  . ANXIETY 10/05/2007  . BENIGN PROSTATIC HYPERTROPHY 04/08/2009  . COLONIC POLYPS, HX OF 10/05/2007  . DEPRESSION 10/05/2007  . GERD 10/05/2007  . Heart murmur   . HYPERTENSION 10/05/2007  . NEPHROLITHIASIS, HX OF 10/05/2007  . SLEEP APNEA, OBSTRUCTIVE 10/02/2008     Social History   Social History  . Marital status: Widowed    Spouse name: N/A  . Number of children: N/A  . Years of education: N/A   Occupational History  . Not on file.   Social History Main Topics  . Smoking status: Never Smoker  . Smokeless tobacco: Never Used  . Alcohol use No  . Drug use: No  . Sexual activity: Not on file   Other Topics Concern  . Not on file   Social History Narrative  . No narrative on file    Past Surgical History:  Procedure Laterality Date  . APPENDECTOMY    . CATARACT EXTRACTION  2014   bilateral  . CHOLECYSTECTOMY    . FOOT SURGERY     left  . HERNIA REPAIR  2014  . WRIST FRACTURE SURGERY     right x2   left x1    Family History  Problem Relation Age of Onset  . Colon cancer Maternal Aunt 70    No Known Allergies  Current Outpatient Prescriptions on File Prior to Visit  Medication Sig Dispense Refill  . ALPRAZolam (XANAX) 1 MG tablet Take 1 mg by mouth 2 (two) times daily.     Marland Kitchen amLODipine (NORVASC) 10 MG tablet TAKE 1 TABLET BY MOUTH EVERY DAY 90 tablet 0  . aspirin 81 MG tablet Take 81 mg  by mouth daily.      Marland Kitchen buPROPion (WELLBUTRIN SR) 150 MG 12 hr tablet Take 150 mg by mouth 2 (two) times daily.    . finasteride (PROSCAR) 5 MG tablet TAKE 1 TABLET (5 MG TOTAL) BY MOUTH DAILY. 90 tablet 1  . losartan-hydrochlorothiazide (HYZAAR) 100-25 MG tablet TAKE 1 TABLET BY MOUTH EVERY DAY 90 tablet 0  . omeprazole (PRILOSEC) 10 MG capsule TAKE ONE CAPSULE BY MOUTH EVERY DAY 90 capsule 3  . traZODone (DESYREL) 100 MG tablet     . ranitidine (ZANTAC) 150 MG tablet Take 150 mg by mouth 2 (two) times daily.     No current facility-administered medications on file prior to visit.     BP 128/68 (BP Location: Left Arm, Patient Position: Sitting, Cuff Size: Normal)   Pulse (!) 54   Temp 98.1 F (36.7 C) (Oral)   Ht 5' 10.5" (1.791 m)   Wt 188 lb (85.3 kg)   SpO2 98%   BMI 26.59 kg/m      Review of Systems  Constitutional: Negative for appetite change, chills, fatigue and fever.  HENT: Negative for congestion, dental problem, ear pain, hearing loss,  sore throat, tinnitus, trouble swallowing and voice change.   Eyes: Negative for pain, discharge and visual disturbance.  Respiratory: Negative for cough, chest tightness, wheezing and stridor.   Cardiovascular: Negative for chest pain, palpitations and leg swelling.  Gastrointestinal: Negative for abdominal distention, abdominal pain, blood in stool, constipation, diarrhea, nausea and vomiting.  Genitourinary: Negative for difficulty urinating, discharge, flank pain, genital sores, hematuria and urgency.  Musculoskeletal: Negative for arthralgias, back pain, gait problem, joint swelling, myalgias and neck stiffness.  Skin: Negative for rash.  Neurological: Negative for dizziness, syncope, speech difficulty, weakness, numbness and headaches.  Hematological: Negative for adenopathy. Does not bruise/bleed easily.  Psychiatric/Behavioral: Negative for behavioral problems and dysphoric mood. The patient is not nervous/anxious.          Objective:   Physical Exam  Constitutional: He is oriented to person, place, and time. He appears well-developed.  HENT:  Head: Normocephalic.  Right Ear: External ear normal.  Left Ear: External ear normal.  Eyes: Conjunctivae and EOM are normal.  Neck: Normal range of motion.  Cardiovascular: Normal rate and normal heart sounds.   Pulmonary/Chest: Breath sounds normal.  Abdominal: Bowel sounds are normal.  Musculoskeletal: Normal range of motion. He exhibits no edema or tenderness.  Neurological: He is alert and oriented to person, place, and time.  Psychiatric: He has a normal mood and affect. His behavior is normal.          Assessment & Plan:   Benign renal cysts Mildly dilated common bile duct status post cholecystectomy Essential hypertension, stable  Patient reassured Follow-up 6 months Patient will report any clinical change  Nyoka Cowden

## 2017-02-16 DIAGNOSIS — M5441 Lumbago with sciatica, right side: Secondary | ICD-10-CM | POA: Diagnosis not present

## 2017-02-16 DIAGNOSIS — G8929 Other chronic pain: Secondary | ICD-10-CM | POA: Diagnosis not present

## 2017-02-20 DIAGNOSIS — M5441 Lumbago with sciatica, right side: Secondary | ICD-10-CM | POA: Diagnosis not present

## 2017-02-20 DIAGNOSIS — G8929 Other chronic pain: Secondary | ICD-10-CM | POA: Diagnosis not present

## 2017-02-26 ENCOUNTER — Other Ambulatory Visit: Payer: Self-pay | Admitting: Internal Medicine

## 2017-02-27 DIAGNOSIS — M5441 Lumbago with sciatica, right side: Secondary | ICD-10-CM | POA: Diagnosis not present

## 2017-02-27 DIAGNOSIS — G8929 Other chronic pain: Secondary | ICD-10-CM | POA: Diagnosis not present

## 2017-03-01 DIAGNOSIS — M5441 Lumbago with sciatica, right side: Secondary | ICD-10-CM | POA: Diagnosis not present

## 2017-03-01 DIAGNOSIS — G8929 Other chronic pain: Secondary | ICD-10-CM | POA: Diagnosis not present

## 2017-03-01 DIAGNOSIS — M1711 Unilateral primary osteoarthritis, right knee: Secondary | ICD-10-CM | POA: Diagnosis not present

## 2017-03-05 ENCOUNTER — Other Ambulatory Visit: Payer: Self-pay | Admitting: Internal Medicine

## 2017-04-24 DIAGNOSIS — H401132 Primary open-angle glaucoma, bilateral, moderate stage: Secondary | ICD-10-CM | POA: Diagnosis not present

## 2017-04-24 DIAGNOSIS — H01009 Unspecified blepharitis unspecified eye, unspecified eyelid: Secondary | ICD-10-CM | POA: Diagnosis not present

## 2017-05-15 DIAGNOSIS — H04123 Dry eye syndrome of bilateral lacrimal glands: Secondary | ICD-10-CM | POA: Diagnosis not present

## 2017-05-15 DIAGNOSIS — H01009 Unspecified blepharitis unspecified eye, unspecified eyelid: Secondary | ICD-10-CM | POA: Diagnosis not present

## 2017-05-15 DIAGNOSIS — H401121 Primary open-angle glaucoma, left eye, mild stage: Secondary | ICD-10-CM | POA: Diagnosis not present

## 2017-05-15 DIAGNOSIS — H401112 Primary open-angle glaucoma, right eye, moderate stage: Secondary | ICD-10-CM | POA: Diagnosis not present

## 2017-05-31 ENCOUNTER — Other Ambulatory Visit: Payer: Self-pay | Admitting: Internal Medicine

## 2017-07-12 ENCOUNTER — Ambulatory Visit: Payer: PPO

## 2017-07-12 NOTE — Progress Notes (Addendum)
Subjective:   Jared [name] is a 76 y.o. male who presents for Medicare Annual/Subsequent preventive examination.  The Patient was informed that the wellness visit is to identify future health risk and educate and initiate measures that can reduce risk for increased disease through the lifespan.    Annual Wellness Assessment  Reports health as fair Dealing more with depression, especially since retirement x 76 yo  Sensitive economic issues; home owned by remote family members and has difficulty keeping it up.  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 Raliegh Ip for eval and treament    Pain; in left knee    Cardiac Risk Factors Addressed Hyperlipidemia - chol-hdl ratio 3; chol 155; hdl 44; ldl 91 and trig 68 Was concerned about diabetes  but BS have been normal    Advanced Directives completed   Patient Care Team: Marletta Lor, MD as PCP - General          Objective:    Vitals: BP 136/80   Pulse (!) 51   Ht 5' 10.5" (1.791 m)   Wt 190 lb 5 oz (86.3 kg)   SpO2 97%   BMI 26.92 kg/m   Body mass index is 26.92 kg/m.  Tobacco History  Smoking Status  . Never Smoker  Smokeless Tobacco  . Never Used     Counseling given: Yes   Past Medical History:  Diagnosis Date  . ALLERGIC RHINITIS 10/05/2007  . ANXIETY 10/05/2007  . BENIGN PROSTATIC HYPERTROPHY 04/08/2009  . COLONIC POLYPS, HX OF 10/05/2007  . DEPRESSION 10/05/2007  . GERD 10/05/2007  . Heart murmur   . HYPERTENSION 10/05/2007  . NEPHROLITHIASIS, HX OF 10/05/2007  . SLEEP APNEA, OBSTRUCTIVE 10/02/2008   Past Surgical History:  Procedure Laterality Date  . APPENDECTOMY    . CATARACT EXTRACTION  2014   bilateral  . CHOLECYSTECTOMY    . FOOT SURGERY     left  . HERNIA REPAIR  2014  . WRIST FRACTURE SURGERY     right x2   left x1   Family History  Problem Relation Age of Onset  . Colon cancer Maternal Aunt  70   History  Sexual Activity  . Sexual activity: Not on file    Outpatient Encounter Prescriptions as of 07/13/2017  Medication Sig  . ALPRAZolam (XANAX) 1 MG tablet Take 1 mg by mouth 2 (two) times daily.   Marland Kitchen amLODipine (NORVASC) 10 MG tablet TAKE 1 TABLET BY MOUTH EVERY DAY  . buPROPion (WELLBUTRIN SR) 150 MG 12 hr tablet Take 150 mg by mouth 2 (two) times daily.  . finasteride (PROSCAR) 5 MG tablet TAKE 1 TABLET (5 MG TOTAL) BY MOUTH DAILY.  Marland Kitchen losartan-hydrochlorothiazide (HYZAAR) 100-25 MG tablet TAKE 1 TABLET BY MOUTH EVERY DAY  . omeprazole (PRILOSEC) 10 MG capsule TAKE ONE CAPSULE BY MOUTH EVERY DAY  . traZODone (DESYREL) 100 MG tablet   . aspirin 81 MG tablet Take 81 mg by mouth daily.    . ranitidine (ZANTAC) 150 MG tablet Take 150 mg by mouth 2 (two) times daily.   No facility-administered encounter medications on  file as of 07/13/2017.     Activities of Daily Living In your present state of health, do you have any difficulty performing the following activities: 07/13/2017  Hearing? N  Vision? N  Walking or climbing stairs? N  Dressing or bathing? N  Doing errands, shopping? N  Preparing Food and eating ? N  Using the Toilet? N  In the past six months, have you accidently leaked urine? N  Comment bladder frequency at hs   Do you have problems with loss of bowel control? N  Managing your Medications? N  Managing your Finances? N  Housekeeping or managing your Housekeeping? N  Some recent data might be hidden    Patient Care Team: Marletta Lor, MD as PCP - General   Assessment:     Exercise Activities and Dietary recommendations Current Exercise Habits: Home exercise routine (limited )  Goals    . patient          Review the resources given and see if they will help you Please make an apt with Dr. Raliegh Ip       Fall Risk Fall Risk  07/13/2017 11/16/2015 08/08/2014 06/12/2013 06/12/2013  Falls in the past year? Yes Yes No No No  Comment lost balance as he does not walk  - - - -  Number falls in past yr: 2 or more 1 - - -  Injury with Fall? No No - - -  Risk for fall due to : - Impaired balance/gait - - -  Follow up Education provided - - - -   Depression Screen PHQ 2/9 Scores 07/13/2017 07/13/2017 11/16/2015 08/08/2014  PHQ - 2 Score 1 0 0 2   Difficult to assess with depression assessment; vague; basically seems to be under financial stress and living with limited resources. Agrees to come in and see Dr. Elenor Legato to counseling   Cognitive Function MMSE - Mini Mental State Exam 07/13/2017  Orientation to time 5  Orientation to Place 5  Registration 3  Attention/ Calculation 5  Recall 3  Language- name 2 objects 2  Language- repeat 1  Language- follow 3 step command 2  Language- follow 3 step command-comments used left instead of right  Language- read & follow  direction 1  Write a sentence 1  Copy design 1  Total score 29        Immunization History  Administered Date(s) Administered  . Influenza Split 10/24/2011  . Influenza Whole 05/28/2010  . Influenza, High Dose Seasonal PF 06/22/2016  .  Influenza,inj,Quad PF,6+ Mos 06/10/2013, 07/16/2014, 06/02/2015  . Influenza-Unspecified 06/03/2017  . Pneumococcal Conjugate-13 08/08/2014  . Pneumococcal Polysaccharide-23 12/10/2012, 06/11/2013  . Tetanus 06/12/2013  . Zoster 10/20/2011   Screening Tests Health Maintenance  Topic Date Due  . COLONOSCOPY  02/06/2018  . TETANUS/TDAP  06/13/2023  . INFLUENZA VACCINE  Completed  . PNA vac Low Risk Adult  Completed      Plan:      PCP Notes   Health Maintenance Had flu vaccine and updated epic   Abnormal Screens  Depression noted but is functional Will not fup on cpap and does not sleep well Does not want to go to Psych Agreed to make apt with Dr. Raliegh Ip to fup Agreed to consider calling [facility] for apt Stress appears to be financial; also has right knee pain and postponing surgery;   Referrals  To self refer to [facility]   Patient concerns; As noted  Nurse Concerns; As noted;   Next PCP apt 09/25/2017 Stated he would make an apt with Dr Raliegh Ip for fup for depressed mood but left and did not do so.   I have personally reviewed and noted the following in the patient's chart:   . Medical and social history . Use of alcohol, tobacco or illicit drugs  . Current medications and supplements . Functional ability and status . Nutritional status . Physical activity . Advanced directives . List of other physicians . Hospitalizations, surgeries, and ER visits in previous 12 months . Vitals . Screenings to include cognitive, depression, and falls . Referrals and appointments  In addition, I have reviewed and discussed with patient certain preventive protocols, quality metrics, and best practice  recommendations. A written personalized care plan for preventive services as well as general preventive health recommendations were provided to patient.     DGUYQ,IHKVQ, RN  07/13/2017  Results of patient's subsequent Medicare wellness visit reviewed and agree with findings.  Nyoka Cowden

## 2017-07-13 ENCOUNTER — Ambulatory Visit (INDEPENDENT_AMBULATORY_CARE_PROVIDER_SITE_OTHER): Payer: PPO

## 2017-07-13 VITALS — BP 136/80 | HR 51 | Ht 70.5 in | Wt 190.3 lb

## 2017-07-13 DIAGNOSIS — Z0000 Encounter for general adult medical examination without abnormal findings: Secondary | ICD-10-CM

## 2017-07-13 NOTE — Patient Instructions (Addendum)
Jared [name] , Thank you for taking time to come for your Medicare Wellness Visit. I appreciate your ongoing commitment to your health goals. Please review the following plan we discussed and let me know if I can assist you in the future.   Will make an apt with Dr. Raliegh Ip when leaving today  Will seek counseling as needed    [facility]; [phone] has services to help with some home repairs  Sr. Line; [phone] Get resource to get information on any and all community programs for Seniors  [place]: (631)735-2364 Community Health Response Program -469-629-5284 Public Health Dept; Need to be a skilled visit but can assist with bathing as well; 973-093-1918   Dept of Social Services; Call [phone] and ask for SW on call  May qualify for assistance with meds;  "low income subsidy"  Options for Medicaid include the Community Alternatives program; [facility] (personal care services) or PACE program, which is a medical and social program combined   [employer] general resources for food etc   Http://nihseniorhealth.giv  Deaf & Hard of Hearing Division Services - can assist with hearing aid x 1  No reviews  [employer] Office  [place] [address]  ([phone]  Resource to find disability equipment (used) online; https://bradley.com/  [facility]  [address]  [place], [place] [address]  Phone [phone]  [place]; https://www.dentistry.LargeNames.tn Application/ Monthly drawing/ potential patients can call [phone] to have an application mailed to their residence within 10 days.     These are the goals we discussed: Goals    . patient          Review the resources given and see if they will help you Please make an apt with Dr. Raliegh Ip        This is a list  of the screening recommended for you and due dates:  Health Maintenance  Topic Date Due  . Flu Shot  04/19/2017  . Colon Cancer Screening  02/06/2018  . Tetanus Vaccine  06/13/2023  . Pneumonia vaccines  Completed        Fall Prevention in the Home Falls can cause injuries. They can happen to people of all ages. There are many things you can do to make your home safe and to help prevent falls. What can I do on the outside of my home?  Regularly fix the edges of walkways and driveways and fix any cracks.  Remove anything that might make you trip as you walk through a door, such as a raised step or threshold.  Trim any bushes or trees on the path to your home.  Use bright outdoor lighting.  Clear any walking paths of anything that might make someone trip, such as rocks or tools.  Regularly check to see if handrails are loose or broken. Make sure that both sides of any steps have handrails.  Any raised decks and porches should have guardrails on the edges.  Have any leaves, snow, or ice cleared regularly.  Use sand or salt on walking paths during winter.  Clean up any spills in your garage right away. This includes oil or grease spills. What can I do in the bathroom?  Use night lights.  Install grab bars by the toilet and in the tub and shower. Do not use towel bars as grab bars.  Use non-skid mats or decals in the tub or shower.  If  you need to sit down in the shower, use a plastic, non-slip stool.  Keep the floor dry. Clean up any water that spills on the floor as soon as it happens.  Remove soap buildup in the tub or shower regularly.  Attach bath mats securely with double-sided non-slip rug tape.  Do not have throw rugs and other things on the floor that can make you trip. What can I do in the bedroom?  Use night lights.  Make sure that you have a light by your bed that is easy to reach.  Do not use any sheets or blankets that are too big for your bed. They  should not hang down onto the floor.  Have a firm chair that has side arms. You can use this for support while you get dressed.  Do not have throw rugs and other things on the floor that can make you trip. What can I do in the kitchen?  Clean up any spills right away.  Avoid walking on wet floors.  Keep items that you use a lot in easy-to-reach places.  If you need to reach something above you, use a strong step stool that has a grab bar.  Keep electrical cords out of the way.  Do not use floor polish or wax that makes floors slippery. If you must use wax, use non-skid floor wax.  Do not have throw rugs and other things on the floor that can make you trip. What can I do with my stairs?  Do not leave any items on the stairs.  Make sure that there are handrails on both sides of the stairs and use them. Fix handrails that are broken or loose. Make sure that handrails are as long as the stairways.  Check any carpeting to make sure that it is firmly attached to the stairs. Fix any carpet that is loose or worn.  Avoid having throw rugs at the top or bottom of the stairs. If you do have throw rugs, attach them to the floor with carpet tape.  Make sure that you have a light switch at the top of the stairs and the bottom of the stairs. If you do not have them, ask someone to add them for you. What else can I do to help prevent falls?  Wear shoes that: ? Do not have high heels. ? Have rubber bottoms. ? Are comfortable and fit you well. ? Are closed at the toe. Do not wear sandals.  If you use a stepladder: ? Make sure that it is fully opened. Do not climb a closed stepladder. ? Make sure that both sides of the stepladder are locked into place. ? Ask someone to hold it for you, if possible.  Clearly mark and make sure that you can see: ? Any grab bars or handrails. ? First and last steps. ? Where the edge of each step is.  Use tools that help you move around (mobility aids) if  they are needed. These include: ? Canes. ? Walkers. ? Scooters. ? Crutches.  Turn on the lights when you go into a dark area. Replace any light bulbs as soon as they burn out.  Set up your furniture so you have a clear path. Avoid moving your furniture around.  If any of your floors are uneven, fix them.  If there are any pets around you, be aware of where they are.  Review your medicines with your doctor. Some medicines can make you feel dizzy. This can  increase your chance of falling. Ask your doctor what other things that you can do to help prevent falls. This information is not intended to replace advice given to you by your health care provider. Make sure you discuss any questions you have with your health care provider. Document Released: 07/02/2009 Document Revised: 02/11/2016 Document Reviewed: 10/10/2014 Elsevier Interactive Patient Education  2018 [place] Maintenance, Male A healthy lifestyle and preventive care is important for your health and wellness. Ask your health care provider about what schedule of regular examinations is right for you. What should I know about weight and diet? Eat a Healthy Diet  Eat plenty of vegetables, fruits, whole grains, low-fat dairy products, and lean protein.  Do not eat a lot of foods high in solid fats, added sugars, or salt.  Maintain a Healthy Weight Regular exercise can help you achieve or maintain a healthy weight. You should:  Do at least 150 minutes of exercise each week. The exercise should increase your heart rate and make you sweat (moderate-intensity exercise).  Do strength-training exercises at least twice a week.  Watch Your Levels of Cholesterol and Blood Lipids  Have your blood tested for lipids and cholesterol every 5 years starting at 74 years of age. If you are at high risk for heart disease, you should start having your blood tested when you are 74 years old. You may need to have your cholesterol  levels checked more often if: ? Your lipid or cholesterol levels are high. ? You are older than 74 years of age. ? You are at high risk for heart disease.  What should I know about cancer screening? Many types of cancers can be detected early and may often be prevented. Lung Cancer  You should be screened every year for lung cancer if: ? You are a current smoker who has smoked for at least 30 years. ? You are a former smoker who has quit within the past 15 years.  Talk to your health care provider about your screening options, when you should start screening, and how often you should be screened.  Colorectal Cancer  Routine colorectal cancer screening usually begins at 74 years of age and should be repeated every 5-10 years until you are 74 years old. You may need to be screened more often if early forms of precancerous polyps or small growths are found. Your health care provider may recommend screening at an earlier age if you have risk factors for colon cancer.  Your health care provider may recommend using home test kits to check for hidden blood in the stool.  A small camera at the end of a tube can be used to examine your colon (sigmoidoscopy or colonoscopy). This checks for the earliest forms of colorectal cancer.  Prostate and Testicular Cancer  Depending on your age and overall health, your health care provider may do certain tests to screen for prostate and testicular cancer.  Talk to your health care provider about any symptoms or concerns you have about testicular or prostate cancer.  Skin Cancer  Check your skin from head to toe regularly.  Tell your health care provider about any new moles or changes in moles, especially if: ? There is a change in a mole's size, shape, or color. ? You have a mole that is larger than a pencil eraser.  Always use sunscreen. Apply sunscreen liberally and repeat throughout the day.  Protect yourself by wearing long sleeves, pants, a  wide-brimmed hat,  and sunglasses when outside.  What should I know about heart disease, diabetes, and high blood pressure?  If you are 42-37 years of age, have your blood pressure checked every 3-5 years. If you are 62 years of age or older, have your blood pressure checked every year. You should have your blood pressure measured twice-once when you are at a hospital or clinic, and once when you are not at a hospital or clinic. Record the average of the two measurements. To check your blood pressure when you are not at a hospital or clinic, you can use: ? An automated blood pressure machine at a pharmacy. ? A home blood pressure monitor.  Talk to your health care provider about your target blood pressure.  If you are between 77-85 years old, ask your health care provider if you should take aspirin to prevent heart disease.  Have regular diabetes screenings by checking your fasting blood sugar level. ? If you are at a normal weight and have a low risk for diabetes, have this test once every three years after the age of 57. ? If you are overweight and have a high risk for diabetes, consider being tested at a younger age or more often.  A one-time screening for abdominal aortic aneurysm (AAA) by ultrasound is recommended for men aged 57-75 years who are current or former smokers. What should I know about preventing infection? Hepatitis B If you have a higher risk for hepatitis B, you should be screened for this virus. Talk with your health care provider to find out if you are at risk for hepatitis B infection. Hepatitis C Blood testing is recommended for:  Everyone born from [birth year] through 1963.  Anyone with known risk factors for hepatitis C.  Sexually Transmitted Diseases (STDs)  You should be screened each year for STDs including gonorrhea and chlamydia if: ? You are sexually active and are younger than 74 years of age. ? You are older than 74 years of age and your health care provider  tells you that you are at risk for this type of infection. ? Your sexual activity has changed since you were last screened and you are at an increased risk for chlamydia or gonorrhea. Ask your health care provider if you are at risk.  Talk with your health care provider about whether you are at high risk of being infected with HIV. Your health care provider may recommend a prescription medicine to help prevent HIV infection.  What else can I do?  Schedule regular health, dental, and eye exams.  Stay current with your vaccines (immunizations).  Do not use any tobacco products, such as cigarettes, chewing tobacco, and e-cigarettes. If you need help quitting, ask your health care provider.  Limit alcohol intake to no more than 2 drinks per day. One drink equals 12 ounces of beer, 5 ounces of wine, or 1 ounces of hard liquor.  Do not use street drugs.  Do not share needles.  Ask your health care provider for help if you need support or information about quitting drugs.  Tell your health care provider if you often feel depressed.  Tell your health care provider if you have ever been abused or do not feel safe at home. This information is not intended to replace advice given to you by your health care provider. Make sure you discuss any questions you have with your health care provider. Document Released: 03/03/2008 Document Revised: 05/04/2016 Document Reviewed: 06/09/2015 Elsevier Interactive Patient Education  2018  [employer].

## 2017-07-26 ENCOUNTER — Emergency Department (HOSPITAL_COMMUNITY): Payer: PPO

## 2017-07-26 ENCOUNTER — Other Ambulatory Visit: Payer: Self-pay | Admitting: Internal Medicine

## 2017-07-26 ENCOUNTER — Other Ambulatory Visit: Payer: Self-pay

## 2017-07-26 ENCOUNTER — Encounter (HOSPITAL_COMMUNITY): Payer: Self-pay | Admitting: Emergency Medicine

## 2017-07-26 ENCOUNTER — Inpatient Hospital Stay (HOSPITAL_COMMUNITY)
Admission: EM | Admit: 2017-07-26 | Discharge: 2017-07-31 | DRG: 042 | Disposition: A | Discharge status: Home Health | Payer: PPO | Attending: Internal Medicine | Admitting: Internal Medicine

## 2017-07-26 DIAGNOSIS — I10 Essential (primary) hypertension: Secondary | ICD-10-CM | POA: Diagnosis not present

## 2017-07-26 DIAGNOSIS — Z87442 Personal history of urinary calculi: Secondary | ICD-10-CM

## 2017-07-26 DIAGNOSIS — W19XXXA Unspecified fall, initial encounter: Secondary | ICD-10-CM | POA: Diagnosis not present

## 2017-07-26 DIAGNOSIS — F411 Generalized anxiety disorder: Secondary | ICD-10-CM | POA: Diagnosis present

## 2017-07-26 DIAGNOSIS — I444 Left anterior fascicular block: Secondary | ICD-10-CM | POA: Diagnosis present

## 2017-07-26 DIAGNOSIS — I420 Dilated cardiomyopathy: Secondary | ICD-10-CM | POA: Diagnosis not present

## 2017-07-26 DIAGNOSIS — I255 Ischemic cardiomyopathy: Secondary | ICD-10-CM | POA: Diagnosis not present

## 2017-07-26 DIAGNOSIS — I639 Cerebral infarction, unspecified: Secondary | ICD-10-CM | POA: Diagnosis present

## 2017-07-26 DIAGNOSIS — E785 Hyperlipidemia, unspecified: Secondary | ICD-10-CM

## 2017-07-26 DIAGNOSIS — I340 Nonrheumatic mitral (valve) insufficiency: Secondary | ICD-10-CM | POA: Diagnosis present

## 2017-07-26 DIAGNOSIS — R55 Syncope and collapse: Secondary | ICD-10-CM

## 2017-07-26 DIAGNOSIS — Y92009 Unspecified place in unspecified non-institutional (private) residence as the place of occurrence of the external cause: Secondary | ICD-10-CM | POA: Diagnosis not present

## 2017-07-26 DIAGNOSIS — D179 Benign lipomatous neoplasm, unspecified: Secondary | ICD-10-CM | POA: Diagnosis present

## 2017-07-26 DIAGNOSIS — G4733 Obstructive sleep apnea (adult) (pediatric): Secondary | ICD-10-CM | POA: Diagnosis present

## 2017-07-26 DIAGNOSIS — F329 Major depressive disorder, single episode, unspecified: Secondary | ICD-10-CM | POA: Diagnosis present

## 2017-07-26 DIAGNOSIS — N400 Enlarged prostate without lower urinary tract symptoms: Secondary | ICD-10-CM | POA: Diagnosis present

## 2017-07-26 DIAGNOSIS — Z86010 Personal history of colonic polyps: Secondary | ICD-10-CM

## 2017-07-26 DIAGNOSIS — R42 Dizziness and giddiness: Secondary | ICD-10-CM | POA: Diagnosis not present

## 2017-07-26 DIAGNOSIS — N401 Enlarged prostate with lower urinary tract symptoms: Secondary | ICD-10-CM | POA: Diagnosis not present

## 2017-07-26 DIAGNOSIS — Z8673 Personal history of transient ischemic attack (TIA), and cerebral infarction without residual deficits: Secondary | ICD-10-CM

## 2017-07-26 DIAGNOSIS — I429 Cardiomyopathy, unspecified: Secondary | ICD-10-CM | POA: Diagnosis not present

## 2017-07-26 DIAGNOSIS — I493 Ventricular premature depolarization: Secondary | ICD-10-CM | POA: Diagnosis not present

## 2017-07-26 DIAGNOSIS — K219 Gastro-esophageal reflux disease without esophagitis: Secondary | ICD-10-CM | POA: Diagnosis present

## 2017-07-26 DIAGNOSIS — I6389 Other cerebral infarction: Secondary | ICD-10-CM | POA: Diagnosis not present

## 2017-07-26 DIAGNOSIS — E876 Hypokalemia: Secondary | ICD-10-CM | POA: Diagnosis not present

## 2017-07-26 HISTORY — DX: Ischemic cardiomyopathy: I25.5

## 2017-07-26 HISTORY — DX: Personal history of transient ischemic attack (TIA), and cerebral infarction without residual deficits: Z86.73

## 2017-07-26 LAB — CBC
HCT: 41.9 % (ref 39.0–52.0)
HEMOGLOBIN: 14.9 g/dL (ref 13.0–17.0)
MCH: 31.5 pg (ref 26.0–34.0)
MCHC: 35.6 g/dL (ref 30.0–36.0)
MCV: 88.6 fL (ref 78.0–100.0)
Platelets: 187 10*3/uL (ref 150–400)
RBC: 4.73 MIL/uL (ref 4.22–5.81)
RDW: 12.3 % (ref 11.5–15.5)
WBC: 7.4 10*3/uL (ref 4.0–10.5)

## 2017-07-26 LAB — DIFFERENTIAL
Basophils Absolute: 0 10*3/uL (ref 0.0–0.1)
Basophils Relative: 0 %
EOS ABS: 0.2 10*3/uL (ref 0.0–0.7)
EOS PCT: 2 %
LYMPHS ABS: 2.1 10*3/uL (ref 0.7–4.0)
LYMPHS PCT: 29 %
MONO ABS: 0.5 10*3/uL (ref 0.1–1.0)
MONOS PCT: 7 %
NEUTROS PCT: 62 %
Neutro Abs: 4.6 10*3/uL (ref 1.7–7.7)

## 2017-07-26 LAB — COMPREHENSIVE METABOLIC PANEL
ALBUMIN: 3.9 g/dL (ref 3.5–5.0)
ALK PHOS: 83 U/L (ref 38–126)
ALT: 32 U/L (ref 17–63)
ANION GAP: 7 (ref 5–15)
AST: 28 U/L (ref 15–41)
BILIRUBIN TOTAL: 0.6 mg/dL (ref 0.3–1.2)
BUN: 20 mg/dL (ref 6–20)
CALCIUM: 9.6 mg/dL (ref 8.9–10.3)
CO2: 29 mmol/L (ref 22–32)
Chloride: 104 mmol/L (ref 101–111)
Creatinine, Ser: 1.05 mg/dL (ref 0.61–1.24)
Glucose, Bld: 98 mg/dL (ref 65–99)
POTASSIUM: 3.6 mmol/L (ref 3.5–5.1)
Sodium: 140 mmol/L (ref 135–145)
TOTAL PROTEIN: 7.1 g/dL (ref 6.5–8.1)

## 2017-07-26 LAB — CBG MONITORING, ED
GLUCOSE-CAPILLARY: 103 mg/dL — AB (ref 65–99)
GLUCOSE-CAPILLARY: 89 mg/dL (ref 65–99)

## 2017-07-26 LAB — PROTIME-INR
INR: 1.03
Prothrombin Time: 13.4 seconds (ref 11.4–15.2)

## 2017-07-26 LAB — I-STAT TROPONIN, ED: TROPONIN I, POC: 0 ng/mL (ref 0.00–0.08)

## 2017-07-26 LAB — APTT: aPTT: 32 seconds (ref 24–36)

## 2017-07-26 NOTE — ED Triage Notes (Signed)
Pt reports intermittent dizziness for the past few months.  Today he felt dizzy while mowing and felt like the mower was getting away from him and he was losing his balance.  When he put the mower up he states he stopped the mower but he kept going and fell.  Denies injury from fall.  States his daughter is a Marine scientist and reported his HR was irregular.  Pt denies chest pain.  No arm drift.  Speech is clear.  No facial droop.  No leg weakness noted at triage.

## 2017-07-26 NOTE — ED Provider Notes (Addendum)
[facility] EMERGENCY DEPARTMENT Provider Note   CSN: [id number] Arrival date & time: 07/26/17  1925     History   Chief Complaint Chief Complaint  Patient presents with  . Dizziness  . Gait Problem  . Palpitations    HPI Jared [name] is a 76 y.o. male.  Patient reports intermittent dizzy spells for several months.  Today he was mowing the yard and felt like he was losing his balance.  While in the garage he had a syncopal spell.  His daughter who is an LPN reported an irregular heartbeat.  No facial asymmetry, confusion, tremor any weakness, altered mental status.  Past medical history includes hypertension, anxiety, depression, sleep apnea.      Past Medical History:  Diagnosis Date  . ALLERGIC RHINITIS 10/05/2007  . ANXIETY 10/05/2007  . BENIGN PROSTATIC HYPERTROPHY 04/08/2009  . COLONIC POLYPS, HX OF 10/05/2007  . DEPRESSION 10/05/2007  . GERD 10/05/2007  . Heart murmur   . HYPERTENSION 10/05/2007  . NEPHROLITHIASIS, HX OF 10/05/2007  . SLEEP APNEA, OBSTRUCTIVE 10/02/2008    Patient Active Problem List   Diagnosis Date Noted  . Diverticulosis of colon without hemorrhage 02/06/2013  . Right inguinal hernia 10/12/2012  . BENIGN PROSTATIC HYPERTROPHY 04/08/2009  . Obstructive sleep apnea 10/02/2008  . Anxiety state 10/05/2007  . Depression 10/05/2007  . Essential hypertension 10/05/2007  . Allergic rhinitis 10/05/2007  . GERD 10/05/2007  . History of colonic polyps 10/05/2007  . NEPHROLITHIASIS, HX OF 10/05/2007    Past Surgical History:  Procedure Laterality Date  . APPENDECTOMY    . CATARACT EXTRACTION  2014   bilateral  . CHOLECYSTECTOMY    . FOOT SURGERY     left  . HERNIA REPAIR  2014  . WRIST FRACTURE SURGERY     right x2   left x1       Home Medications    Prior to Admission medications   Medication Sig Start Date End Date Taking? Authorizing Provider  ALPRAZolam Duanne Moron) 0.5 MG tablet Take 0.5 mg 3 (three) times daily by  mouth.  10/17/11  Yes [provider]  amLODipine (NORVASC) 10 MG tablet TAKE 1 TABLET BY MOUTH EVERY DAY Patient taking differently: TAKE 10 mg  TABLET BY MOUTH EVERY DAY 06/01/17  Yes Marletta Lor, MD  buPROPion [facility] SR) 150 MG 12 hr tablet Take 150 mg by mouth 2 (two) times daily.   Yes [provider]  finasteride (PROSCAR) 5 MG tablet TAKE 1 TABLET (5 MG TOTAL) BY MOUTH DAILY. 07/26/17  Yes Marletta Lor, MD  fluorometholone (FML) 0.1 % ophthalmic suspension Place 1 drop 3 (three) times daily into both eyes. 04/26/17  Yes [provider]  losartan-hydrochlorothiazide (HYZAAR) 100-25 MG tablet TAKE 1 TABLET BY MOUTH EVERY DAY Patient taking differently: TAKE 25 mg TABLET BY MOUTH EVERY DAY 06/01/17  Yes Marletta Lor, MD  omeprazole (PRILOSEC) 10 MG capsule TAKE ONE CAPSULE BY MOUTH EVERY DAY Patient taking differently: TAKE 10 mg CAPSULE BY MOUTH EVERY DAY 02/27/17  Yes Marletta Lor, MD  timolol (TIMOPTIC) 0.5 % ophthalmic solution Place 1 drop daily into both eyes. 06/29/17  Yes [provider]  traZODone (DESYREL) 100 MG tablet Take 100 mg at bedtime by mouth.  11/24/14  Yes [provider]    Family History Family History  Problem Relation Age of Onset  . Colon cancer Maternal Aunt 70    Social History Social History   Tobacco Use  .  Smoking status: Never Smoker  . Smokeless tobacco: Never Used  Substance Use Topics  . Alcohol use: No  . Drug use: No     Allergies   Patient has no known allergies.   Review of Systems Review of Systems  All other systems reviewed and are negative.    Physical Exam Updated Vital Signs BP 138/64   Pulse (!) 131   Temp 98.6 F (37 C) (Oral)   Resp (!) 22   SpO2 94%   Physical Exam  Constitutional: He is oriented to person, place, and time. He appears well-developed and well-nourished.  HENT:  Head: Normocephalic and atraumatic.  Eyes: Conjunctivae are  normal.  Neck: Neck supple.  Cardiovascular: Normal rate and regular rhythm.  Irregular pulse  Pulmonary/Chest: Effort normal and breath sounds normal.  Abdominal: Soft. Bowel sounds are normal.  Musculoskeletal: Normal range of motion.  Neurological: He is alert and oriented to person, place, and time.  Skin: Skin is warm and dry.  Psychiatric: He has a normal mood and affect. His behavior is normal.  Nursing note and vitals reviewed.    ED Treatments / Results  Labs (all labs ordered are listed, but only abnormal results are displayed) Labs Reviewed  CBG MONITORING, ED - Abnormal; Notable for the following components:      Result Value   Glucose-Capillary 103 (*)    All other components within normal limits  PROTIME-INR  APTT  CBC  DIFFERENTIAL  COMPREHENSIVE METABOLIC PANEL  I-STAT TROPONIN, ED  CBG MONITORING, ED    EKG  EKG Interpretation  Date/Time:  Wednesday July 26 2017 22:44:27 EST Ventricular Rate:  90 PR Interval:  172 QRS Duration: 116 QT Interval:  430 QTC Calculation: 437 R Axis:   -55 Text Interpretation:  Sinus rhythm Paired ventricular premature complexes LVH with IVCD, LAD and secondary repol abnrm When compared with ECG of EARLIER SAME DATE No significant change was found Confirmed by Delora Fuel ([id number]) on 07/26/2017 11:42:58 PM       Radiology Ct Head Wo Contrast  Result Date: 07/26/2017 CLINICAL DATA:  Intermittent dizziness for few months. Dizziness today. EXAM: CT HEAD WITHOUT CONTRAST TECHNIQUE: Contiguous axial images were obtained from the base of the skull through the vertex without intravenous contrast. COMPARISON:  MRI brain 08/12/2009 FINDINGS: Brain: Diffuse cerebral atrophy. Ventricular dilatation consistent with central atrophy. Low-attenuation changes in the deep white matter consistent with small vessel ischemia. No evidence of acute infarction, hemorrhage, hydrocephalus, extra-axial collection or mass lesion/mass effect.  Vascular: Vertebrobasilar and internal carotid artery calcifications. Skull: The calvarium appears intact. Sinuses/Orbits: Mucosal thickening in the paranasal sinuses. No acute air-fluid levels. Mastoid air cells are not opacified. Other: None. IMPRESSION: No acute intracranial abnormalities. Chronic atrophy and small vessel ischemic changes. Electronically Signed   By: Lucienne Capers M.D.   On: 07/26/2017 21:55    Procedures Procedures (including critical care time)  Medications Ordered in ED Medications - No data to display   Initial Impression / Assessment and Plan / ED Course  I have reviewed the triage vital signs and the nursing notes.  Pertinent labs & imaging results that were available during my care of the patient were reviewed by me and considered in my medical decision making (see chart for details).    Patient presents with dizzy spells and syncope.  Discussed EKG with the cardiologist on call.  He suggested APCs and VPCs.  Will discuss with hospitalist and recommended admission.   Final Clinical Impressions(s) / ED  Diagnoses   Final diagnoses:  Syncope, unspecified syncope type    ED Discharge Orders    None       Nat Christen, MD 07/26/17 2325    Nat Christen, MD 07/27/17 0040

## 2017-07-27 ENCOUNTER — Observation Stay (HOSPITAL_BASED_OUTPATIENT_CLINIC_OR_DEPARTMENT_OTHER): Payer: PPO

## 2017-07-27 ENCOUNTER — Other Ambulatory Visit: Payer: Self-pay

## 2017-07-27 ENCOUNTER — Observation Stay (HOSPITAL_COMMUNITY): Payer: PPO

## 2017-07-27 DIAGNOSIS — R42 Dizziness and giddiness: Secondary | ICD-10-CM

## 2017-07-27 DIAGNOSIS — G4733 Obstructive sleep apnea (adult) (pediatric): Secondary | ICD-10-CM | POA: Diagnosis present

## 2017-07-27 DIAGNOSIS — E876 Hypokalemia: Secondary | ICD-10-CM | POA: Diagnosis not present

## 2017-07-27 DIAGNOSIS — R55 Syncope and collapse: Secondary | ICD-10-CM | POA: Diagnosis present

## 2017-07-27 DIAGNOSIS — K219 Gastro-esophageal reflux disease without esophagitis: Secondary | ICD-10-CM | POA: Diagnosis present

## 2017-07-27 DIAGNOSIS — F411 Generalized anxiety disorder: Secondary | ICD-10-CM

## 2017-07-27 DIAGNOSIS — Z86010 Personal history of colonic polyps: Secondary | ICD-10-CM | POA: Diagnosis not present

## 2017-07-27 DIAGNOSIS — W19XXXA Unspecified fall, initial encounter: Secondary | ICD-10-CM

## 2017-07-27 DIAGNOSIS — Z87442 Personal history of urinary calculi: Secondary | ICD-10-CM | POA: Diagnosis not present

## 2017-07-27 DIAGNOSIS — I6389 Other cerebral infarction: Secondary | ICD-10-CM | POA: Diagnosis not present

## 2017-07-27 DIAGNOSIS — Y92009 Unspecified place in unspecified non-institutional (private) residence as the place of occurrence of the external cause: Secondary | ICD-10-CM | POA: Diagnosis not present

## 2017-07-27 DIAGNOSIS — I493 Ventricular premature depolarization: Secondary | ICD-10-CM | POA: Diagnosis not present

## 2017-07-27 DIAGNOSIS — N401 Enlarged prostate with lower urinary tract symptoms: Secondary | ICD-10-CM | POA: Diagnosis not present

## 2017-07-27 DIAGNOSIS — I639 Cerebral infarction, unspecified: Secondary | ICD-10-CM | POA: Diagnosis present

## 2017-07-27 DIAGNOSIS — I429 Cardiomyopathy, unspecified: Secondary | ICD-10-CM | POA: Diagnosis not present

## 2017-07-27 DIAGNOSIS — D179 Benign lipomatous neoplasm, unspecified: Secondary | ICD-10-CM | POA: Diagnosis present

## 2017-07-27 DIAGNOSIS — I420 Dilated cardiomyopathy: Secondary | ICD-10-CM | POA: Diagnosis not present

## 2017-07-27 DIAGNOSIS — I255 Ischemic cardiomyopathy: Secondary | ICD-10-CM | POA: Diagnosis present

## 2017-07-27 DIAGNOSIS — N400 Enlarged prostate without lower urinary tract symptoms: Secondary | ICD-10-CM | POA: Diagnosis present

## 2017-07-27 DIAGNOSIS — F329 Major depressive disorder, single episode, unspecified: Secondary | ICD-10-CM | POA: Diagnosis present

## 2017-07-27 DIAGNOSIS — I340 Nonrheumatic mitral (valve) insufficiency: Secondary | ICD-10-CM

## 2017-07-27 DIAGNOSIS — E785 Hyperlipidemia, unspecified: Secondary | ICD-10-CM | POA: Diagnosis present

## 2017-07-27 DIAGNOSIS — I444 Left anterior fascicular block: Secondary | ICD-10-CM | POA: Diagnosis present

## 2017-07-27 DIAGNOSIS — I10 Essential (primary) hypertension: Secondary | ICD-10-CM | POA: Diagnosis present

## 2017-07-27 LAB — BASIC METABOLIC PANEL
Anion gap: 8 (ref 5–15)
BUN: 19 mg/dL (ref 6–20)
CO2: 26 mmol/L (ref 22–32)
CREATININE: 0.95 mg/dL (ref 0.61–1.24)
Calcium: 8.8 mg/dL — ABNORMAL LOW (ref 8.9–10.3)
Chloride: 106 mmol/L (ref 101–111)
GFR calc Af Amer: >60 mL/min (ref >60)
Glucose, Bld: 135 mg/dL — ABNORMAL HIGH (ref 65–99)
Potassium: 3.2 mmol/L — ABNORMAL LOW (ref 3.5–5.1)
SODIUM: 140 mmol/L (ref 135–145)

## 2017-07-27 LAB — CBC WITH DIFFERENTIAL/PLATELET
BASOS PCT: 1 %
Basophils Absolute: 0 10*3/uL (ref 0.0–0.1)
EOS ABS: 0.2 10*3/uL (ref 0.0–0.7)
EOS PCT: 4 %
HCT: 38.7 % — ABNORMAL LOW (ref 39.0–52.0)
Hemoglobin: 13.5 g/dL (ref 13.0–17.0)
LYMPHS ABS: 2.1 10*3/uL (ref 0.7–4.0)
Lymphocytes Relative: 36 %
MCH: 31 pg (ref 26.0–34.0)
MCHC: 34.9 g/dL (ref 30.0–36.0)
MCV: 89 fL (ref 78.0–100.0)
MONOS PCT: 9 %
Monocytes Absolute: 0.5 10*3/uL (ref 0.1–1.0)
Neutro Abs: 2.9 10*3/uL (ref 1.7–7.7)
Neutrophils Relative %: 50 %
PLATELETS: 170 10*3/uL (ref 150–400)
RBC: 4.35 MIL/uL (ref 4.22–5.81)
RDW: 12.5 % (ref 11.5–15.5)
WBC: 5.7 10*3/uL (ref 4.0–10.5)

## 2017-07-27 LAB — TROPONIN I: Troponin I: <0.03 ng/mL (ref <0.03)

## 2017-07-27 LAB — URINALYSIS, ROUTINE W REFLEX MICROSCOPIC
BILIRUBIN URINE: NEGATIVE
Glucose, UA: NEGATIVE mg/dL
Hgb urine dipstick: NEGATIVE
KETONES UR: 5 mg/dL — AB
Leukocytes, UA: NEGATIVE
NITRITE: NEGATIVE
PROTEIN: NEGATIVE mg/dL
SPECIFIC GRAVITY, URINE: 1.019 (ref 1.005–1.030)
pH: 6 (ref 5.0–8.0)

## 2017-07-27 LAB — HEMOGLOBIN A1C
HEMOGLOBIN A1C: 5.3 % (ref 4.8–5.6)
Mean Plasma Glucose: 105.41 mg/dL

## 2017-07-27 LAB — ECHOCARDIOGRAM COMPLETE
HEIGHTINCHES: 70 in
WEIGHTICAEL: 2985.6 [oz_av]

## 2017-07-27 LAB — LIPID PANEL
Cholesterol: 162 mg/dL (ref 0–200)
HDL: 41 mg/dL (ref >40)
LDL CALC: 102 mg/dL — AB (ref 0–99)
Total CHOL/HDL Ratio: 4 RATIO
Triglycerides: 95 mg/dL (ref <150)
VLDL: 19 mg/dL (ref 0–40)

## 2017-07-27 LAB — MAGNESIUM: MAGNESIUM: 1.7 mg/dL (ref 1.7–2.4)

## 2017-07-27 LAB — TSH: TSH: 1.739 u[IU]/mL (ref 0.350–4.500)

## 2017-07-27 MED ORDER — ASPIRIN EC 325 MG PO TBEC
325.0000 mg | DELAYED_RELEASE_TABLET | Freq: Every day | ORAL | Status: DC
  Administered 2017-07-28 – 2017-07-31 (×4): 325 mg via ORAL
  Filled 2017-07-27 (×5): qty 1

## 2017-07-27 MED ORDER — AMLODIPINE BESYLATE 10 MG PO TABS
10.0000 mg | ORAL_TABLET | Freq: Every day | ORAL | Status: DC
  Administered 2017-07-27: 10 mg via ORAL
  Filled 2017-07-27: qty 1

## 2017-07-27 MED ORDER — LOSARTAN POTASSIUM 50 MG PO TABS
100.0000 mg | ORAL_TABLET | Freq: Every day | ORAL | Status: DC
  Administered 2017-07-27: 100 mg via ORAL
  Filled 2017-07-27: qty 2

## 2017-07-27 MED ORDER — ENOXAPARIN SODIUM 40 MG/0.4ML ~~LOC~~ SOLN
40.0000 mg | SUBCUTANEOUS | Status: DC
  Administered 2017-07-27 – 2017-07-30 (×4): 40 mg via SUBCUTANEOUS
  Filled 2017-07-27 (×4): qty 0.4

## 2017-07-27 MED ORDER — ASPIRIN EC 81 MG PO TBEC
81.0000 mg | DELAYED_RELEASE_TABLET | Freq: Once | ORAL | Status: AC
  Administered 2017-07-27: 81 mg via ORAL
  Filled 2017-07-27: qty 1

## 2017-07-27 MED ORDER — HYDROCHLOROTHIAZIDE 25 MG PO TABS
25.0000 mg | ORAL_TABLET | Freq: Every day | ORAL | Status: DC
  Administered 2017-07-27: 25 mg via ORAL
  Filled 2017-07-27: qty 1

## 2017-07-27 MED ORDER — ALPRAZOLAM 0.25 MG PO TABS: 0.5000 mg | ORAL_TABLET | Freq: Three times a day (TID) | ORAL | Status: DC

## 2017-07-27 MED ORDER — ATORVASTATIN CALCIUM 40 MG PO TABS
40.0000 mg | ORAL_TABLET | Freq: Every day | ORAL | Status: DC
  Administered 2017-07-27 – 2017-07-31 (×5): 40 mg via ORAL
  Filled 2017-07-27 (×5): qty 1

## 2017-07-27 MED ORDER — ACETAMINOPHEN 325 MG PO TABS: 650.0000 mg | ORAL_TABLET | ORAL | Status: DC | PRN

## 2017-07-27 MED ORDER — TRAZODONE HCL 100 MG PO TABS
100.0000 mg | ORAL_TABLET | Freq: Every day | ORAL | Status: DC
  Administered 2017-07-27 – 2017-07-30 (×5): 100 mg via ORAL
  Filled 2017-07-27 (×4): qty 1
  Filled 2017-07-27: qty 2
  Filled 2017-07-27: qty 1

## 2017-07-27 MED ORDER — FINASTERIDE 5 MG PO TABS
5.0000 mg | ORAL_TABLET | Freq: Every day | ORAL | Status: DC
  Administered 2017-07-27 – 2017-07-31 (×5): 5 mg via ORAL
  Filled 2017-07-27 (×5): qty 1

## 2017-07-27 MED ORDER — BUPROPION HCL ER (SR) 150 MG PO TB12
150.0000 mg | ORAL_TABLET | Freq: Two times a day (BID) | ORAL | Status: DC
  Administered 2017-07-27 – 2017-07-30 (×7): 150 mg via ORAL
  Filled 2017-07-27 (×8): qty 1

## 2017-07-27 MED ORDER — SODIUM CHLORIDE 0.9 % IV SOLN
INTRAVENOUS | Status: DC
  Administered 2017-07-27: 02:00:00 via INTRAVENOUS

## 2017-07-27 MED ORDER — POTASSIUM CHLORIDE CRYS ER 20 MEQ PO TBCR
40.0000 meq | EXTENDED_RELEASE_TABLET | ORAL | Status: AC
  Administered 2017-07-27 (×2): 40 meq via ORAL
  Filled 2017-07-27 (×3): qty 2

## 2017-07-27 MED ORDER — PANTOPRAZOLE SODIUM 40 MG PO TBEC
40.0000 mg | DELAYED_RELEASE_TABLET | Freq: Every day | ORAL | Status: DC
Start: 2017-07-27 — End: 2017-07-31
  Administered 2017-07-27 – 2017-07-30 (×4): 40 mg via ORAL
  Filled 2017-07-27 (×4): qty 1

## 2017-07-27 MED ORDER — ACETAMINOPHEN 650 MG RE SUPP: 650.0000 mg | RECTAL | Status: DC | PRN

## 2017-07-27 MED ORDER — ACETAMINOPHEN 160 MG/5ML PO SOLN: 650.0000 mg | ORAL | Status: DC | PRN

## 2017-07-27 MED ORDER — ALPRAZOLAM 0.5 MG PO TABS
0.5000 mg | ORAL_TABLET | Freq: Three times a day (TID) | ORAL | Status: DC | PRN
  Administered 2017-07-27 – 2017-07-31 (×7): 0.5 mg via ORAL
  Filled 2017-07-27: qty 1
  Filled 2017-07-27: qty 2
  Filled 2017-07-27 (×5): qty 1

## 2017-07-27 MED ORDER — MAGNESIUM SULFATE IN D5W 1-5 GM/100ML-% IV SOLN
1.0000 g | Freq: Once | INTRAVENOUS | Status: AC
  Administered 2017-07-27: 1 g via INTRAVENOUS
  Filled 2017-07-27: qty 100

## 2017-07-27 MED ORDER — LOSARTAN POTASSIUM-HCTZ 100-25 MG PO TABS: 1.0000 | ORAL_TABLET | Freq: Every day | ORAL | Status: DC

## 2017-07-27 MED ORDER — ASPIRIN EC 81 MG PO TBEC
243.0000 mg | DELAYED_RELEASE_TABLET | Freq: Once | ORAL | Status: AC
  Administered 2017-07-27: 243 mg via ORAL
  Filled 2017-07-27: qty 3

## 2017-07-27 MED ORDER — FLUOROMETHOLONE 0.1 % OP SUSP
1.0000 [drp] | Freq: Three times a day (TID) | OPHTHALMIC | Status: DC
  Administered 2017-07-27 – 2017-07-30 (×10): 1 [drp] via OPHTHALMIC
  Filled 2017-07-27 (×2): qty 5

## 2017-07-27 MED ORDER — TIMOLOL MALEATE 0.5 % OP SOLN
1.0000 [drp] | Freq: Every day | OPHTHALMIC | Status: DC
  Administered 2017-07-27 – 2017-07-31 (×5): 1 [drp] via OPHTHALMIC
  Filled 2017-07-27: qty 5

## 2017-07-27 NOTE — Evaluation (Signed)
Physical Therapy Evaluation Patient Details Name: Jared [name] MRN: [id number] DOB: 1941-04-16 Today's Date: 07/27/2017   History of Present Illness  Pt is a 76 y.o. male presented with c/o dizziness s/p mechanical fall while mowing the lawn. Pt reports that he has had dizzy episodes periodically over last 3 weeks. 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  Clinical Impression  Pt admitted with above diagnosis. Pt currently with functional limitations due to the deficits listed below (see PT Problem List). Pt is limited in his mobility by increased instability in gait and decreased safety awareness. Pt currently mod I for bed mobility, and hands on min guard for transfers and ambulation of 400 feet without AD with 2x LoB requiring minA for steadying. Pt educated on need for RW for ambulation to aid with LoB.  Pt will benefit from skilled PT to increase their independence and safety with mobility to allow discharge to the venue listed below.       Follow Up Recommendations Home health PT;Supervision - Intermittent    Equipment Recommendations  Rolling walker with 5" wheels    Recommendations for Other Services OT consult     Precautions / Restrictions Precautions Precautions: Fall Precaution Comments: fallen out of tub x3 in last 3 months, fallen in yard x2  Restrictions Weight Bearing Restrictions: No      Mobility  Bed Mobility Overal bed mobility: Modified Independent             General bed mobility comments: HOB elevated and used bedrail to pull up to EoB  Transfers Overall transfer level: Needs assistance Equipment used: None Transfers: Sit to/from Stand Sit to Stand: Min guard         General transfer comment: min guard for safety, good power up and steadying in standing, no c/o  dizziness  Ambulation/Gait Ambulation/Gait assistance: Min guard;Min assist Ambulation Distance (Feet): 400 Feet Assistive device: None Gait Pattern/deviations: Ataxic;Step-through pattern;Narrow base of support;Drifts right/left;Scissoring Gait velocity: slowed Gait velocity interpretation: Below normal speed for age/gender General Gait Details: mainly hands-on min guard for ambulation however experienced 2x scissoring that required minA for steadying, vc for wider base of support and upright posture, pt with c/o of 2/10 R knee pain with gait which he reports is his usual    Modified Rankin (Stroke Patients Only) Modified Rankin (Stroke Patients Only) Pre-Morbid Rankin Score: No symptoms Modified Rankin: No significant disability     Balance Overall balance assessment: Needs assistance Sitting-balance support: Feet supported;Feet unsupported;No upper extremity supported Sitting balance-Leahy Scale: Good Sitting balance - Comments: able to put on socks in sitting without LoB   Standing balance support: No upper extremity supported;During functional activity Standing balance-Leahy Scale: Good Standing balance comment: does not require assist but would not withstand maximal challange to balance                             Pertinent Vitals/Pain Pain Assessment: No/denies pain    Home Living Family/patient expects to be discharged to:: Private residence Living Arrangements: Alone Available Help at Discharge: Family;Friend(s);Available PRN/intermittently Type of Home: House Home Access: Stairs to enter Entrance Stairs-Rails: None Entrance Stairs-Number of Steps: 3 Home Layout: One level Home Equipment: Cane - single point      Prior Function Level of Independence: Independent  Comments: daughter reports that he has been losing weight and is not always steady on his feet     Hand Dominance        Extremity/Trunk Assessment   Upper Extremity  Assessment Upper Extremity Assessment: Overall WFL for tasks assessed    Lower Extremity Assessment Lower Extremity Assessment: RLE deficits/detail RLE Deficits / Details: R knee pain with ambulation, R knee lacking 5 degrees of extension, functional strength grossly 3/5,  RLE Sensation: (intact)       Communication   Communication: No difficulties  Cognition Arousal/Alertness: Awake/alert Behavior During Therapy: Impulsive Overall Cognitive Status: Impaired/Different from baseline Area of Impairment: Attention;Memory;Following commands;Safety/judgement;Awareness                   Current Attention Level: Selective   Following Commands: Follows multi-step commands inconsistently Safety/Judgement: Decreased awareness of safety;Decreased awareness of deficits Awareness: Emergent   General Comments: difficulty with word finding this am,       General Comments General comments (skin integrity, edema, etc.): Pt daughter came in half way through session.         Assessment/Plan    PT Assessment Patient needs continued PT services  PT Problem List Decreased range of motion;Decreased balance;Decreased mobility;Decreased cognition;Decreased safety awareness       PT Treatment Interventions DME instruction;Gait training;Stair training;Functional mobility training;Therapeutic activities;Therapeutic exercise;Balance training;Cognitive remediation;Patient/family education    PT Goals (Current goals can be found in the Care Plan section)  Acute Rehab PT Goals Patient Stated Goal: go home PT Goal Formulation: With patient/family Time For Goal Achievement: 08/10/17 Potential to Achieve Goals: Good    Frequency Min 4X/week    AM-PAC PT "6 Clicks" Daily Activity  Outcome Measure Difficulty turning over in bed (including adjusting bedclothes, sheets and blankets)?: A Little Difficulty moving from lying on back to sitting on the side of the bed? : A Little Difficulty  sitting down on and standing up from a chair with arms (e.g., wheelchair, bedside commode, etc,.)?: None Help needed moving to and from a bed to chair (including a wheelchair)?: A Little Help needed walking in hospital room?: A Little Help needed climbing 3-5 steps with a railing? : A Little 6 Click Score: 19    End of Session Equipment Utilized During Treatment: Gait belt Activity Tolerance: Patient tolerated treatment well Patient left: in chair;with call bell/phone within reach;with chair alarm set;with family/visitor present Nurse Communication: Mobility status PT Visit Diagnosis: Unsteadiness on feet (R26.81);Other abnormalities of gait and mobility (R26.89);Repeated falls (R29.6);Muscle weakness (generalized) (M62.81);History of falling (Z91.81);Ataxic gait (R26.0);Difficulty in walking, not elsewhere classified (R26.2);Other symptoms and signs involving the nervous system (R29.898)    Time: 1275-1700 PT Time Calculation (min) (ACUTE ONLY): 32 min   Charges:   PT Evaluation $PT Eval Moderate Complexity: 1 Mod PT Treatments $Gait Training: 8-22 mins   PT G Codes:        Zalayah Pizzuto B. Migdalia Dk PT, DPT Acute Rehabilitation  [phone] Pager [phone]    [place] 07/27/2017, 12:43 PM

## 2017-07-27 NOTE — Care Management Note (Signed)
Case Management Note  Patient Details  Name: Jared [name] MRN: [id number] Date of Birth: April 17, 1941  Subjective/Objective:    Pt admitted with CVA. He is from home with his spouse.                 Action/Plan: PT recommending [place] services. Awaiting OT eval. CM following for d/c needs, physician orders.    Expected Discharge Date:  07/29/17               Expected Discharge Plan:  [place]  In-House Referral:     Discharge planning Services  CM Consult  Post Acute Care Choice:    Choice offered to:     DME Arranged:    DME Agency:     HH Arranged:    [place] Agency:     Status of Service:  In process, will continue to follow  If discussed at Long Length of Stay Meetings, dates discussed:    Additional Comments:  Pollie Friar, RN 07/27/2017, 1:32 PM

## 2017-07-27 NOTE — Progress Notes (Signed)
Received from ED via stretcher.  Denies pain, able to slide from stretcher to bed with minimal difficulty.  VSS, afebrile.  Condom cath on upon arrival due to frequency, hx BPH.  NIH = 0, flat affect, verbalizes understanding of safety precautions, plan of care & unit orientation.

## 2017-07-27 NOTE — Progress Notes (Signed)
  Echocardiogram 2D Echocardiogram has been performed.  [name] 07/27/2017, 10:59 AM

## 2017-07-27 NOTE — Progress Notes (Signed)
[facility] PLAN OF CARE NOTE Patient: Jared [name] GEF:[id number]   PCP: Marletta Lor, MD DOB: [birth year]   DOA: 07/26/2017   DOS: 07/27/2017    Patient was admitted by my colleague Dr. Tamala Julian earlier on 07/27/2017. I have reviewed the H&P as well as assessment and plan and agree with the same. Important changes in the plan are listed below.  Plan of care: Principal Problem:   Dizziness Active Problems:   Anxiety state   Essential hypertension   BPH (benign prostatic hyperplasia)   Fall at home, initial encounter   Frequent PVCs  acute CVA On aspirin, lipitor PTOT speech consulted Passed swallow eval.  Neurology consulted  Acute combined CHF Frequent PVC with palpitation and bradycardia Cardiology consulted   Author: Berle Mull, MD [facility] Pager: [phone] 07/27/2017 2:14 PM   If 7PM-7AM, please contact night-coverage at [email or web address], password [facility]

## 2017-07-27 NOTE — ED Notes (Signed)
Patient transported to X-ray 

## 2017-07-27 NOTE — H&P (Signed)
History and Physical    Jared [name] JJK:[id number] DOB: September 11, 1941 DOA: 07/26/2017  Referring MD/NP/PA: Dr. Nat Christen PCP: Marletta Lor, MD  Patient coming from: home  Chief Complaint: Dizziness  HPI: Jared [name] is a 76 y.o. male with medical history significant of HTN, heart murmur, anxiety, and BPH; who presents with complaints of dizziness.  History is obtained from the patient.  He reports being under a lot of social stressors recently.  It is not quite clear how long symptoms have occurred.  In the last few days he notes feeling more dizzy/ off balance more frequently.  Notes that he has been running into the corners of doors in his home.  Does not feel that he lightheaded, going to pass out, or that the room was spinning around him.  While mowing the lawn this afternoon he reported feeling as though the lawn mower was moving faster than he was able to although it was not self-propelled.  He does admit that symptoms are worsened with getting up and moving too quickly.  Due to symptoms patient reports  going to put the mower back up and lost his balance falling over the mower.  Patient denies any loss of consciousness although there were reports of this initially.  He did not sustain any significant trauma to his head.  Associated symptoms include reports of palpitations, urinary urgency that he relates to his prostate.  Denies any recent medication changes, focal weakness, slurred speech, change of vision, ear pain, fever, chills, headache, change in vision, nausea, vomiting, diarrhea, cough, or shortness of breath.  ED Course: Upon admission into the emergency room patient was found to be afebrile, pulse 51-131, respirations 12-22, blood pressure 135/70-156/94, O2 saturations maintained on room air.  Orthostatic vital signs were unremarkable.  Labs relatively within normal limits.  CT scan of the brain showed no acute abnormalities.  Cardiology was consulted regarding EKG findings and  suggested APCs and VPCs.  TRH called to admit.  Review of Systems  Constitutional: Negative for chills, fever and weight loss.  HENT: Negative for ear discharge and nosebleeds.   Eyes: Negative for photophobia and pain.  Respiratory: Negative for cough and shortness of breath.   Cardiovascular: Positive for palpitations. Negative for chest pain and leg swelling.  Gastrointestinal: Negative for abdominal pain, diarrhea, nausea and vomiting.  Genitourinary: Positive for urgency. Negative for dysuria and hematuria.  Musculoskeletal: Negative for back pain and neck pain.  Skin: Negative for itching and rash.  Neurological: Positive for dizziness and headaches. Negative for sensory change, focal weakness and loss of consciousness.  Psychiatric/Behavioral: Positive for substance abuse. Negative for suicidal ideas. The patient is nervous/anxious.     Past Medical History:  Diagnosis Date  . ALLERGIC RHINITIS 10/05/2007  . ANXIETY 10/05/2007  . BENIGN PROSTATIC HYPERTROPHY 04/08/2009  . COLONIC POLYPS, HX OF 10/05/2007  . DEPRESSION 10/05/2007  . GERD 10/05/2007  . Heart murmur   . HYPERTENSION 10/05/2007  . NEPHROLITHIASIS, HX OF 10/05/2007  . SLEEP APNEA, OBSTRUCTIVE 10/02/2008    Past Surgical History:  Procedure Laterality Date  . APPENDECTOMY    . CATARACT EXTRACTION  2014   bilateral  . CHOLECYSTECTOMY    . FOOT SURGERY     left  . HERNIA REPAIR  2014  . WRIST FRACTURE SURGERY     right x2   left x1     reports that  has never smoked. he has never used smokeless tobacco. He reports that he does  not drink alcohol or use drugs.  No Known Allergies  Family History  Problem Relation Age of Onset  . Colon cancer Maternal Aunt 70    Prior to Admission medications   Medication Sig Start Date End Date Taking? Authorizing Provider  ALPRAZolam Duanne Moron) 0.5 MG tablet Take 0.5 mg 3 (three) times daily by mouth.  10/17/11  Yes [provider]  amLODipine (NORVASC) 10 MG tablet  TAKE 1 TABLET BY MOUTH EVERY DAY Patient taking differently: TAKE 10 mg  TABLET BY MOUTH EVERY DAY 06/01/17  Yes Marletta Lor, MD  buPROPion [facility] SR) 150 MG 12 hr tablet Take 150 mg by mouth 2 (two) times daily.   Yes [provider]  finasteride (PROSCAR) 5 MG tablet TAKE 1 TABLET (5 MG TOTAL) BY MOUTH DAILY. 07/26/17  Yes Marletta Lor, MD  fluorometholone (FML) 0.1 % ophthalmic suspension Place 1 drop 3 (three) times daily into both eyes. 04/26/17  Yes [provider]  losartan-hydrochlorothiazide (HYZAAR) 100-25 MG tablet TAKE 1 TABLET BY MOUTH EVERY DAY Patient taking differently: TAKE 25 mg TABLET BY MOUTH EVERY DAY 06/01/17  Yes Marletta Lor, MD  omeprazole (PRILOSEC) 10 MG capsule TAKE ONE CAPSULE BY MOUTH EVERY DAY Patient taking differently: TAKE 10 mg CAPSULE BY MOUTH EVERY DAY 02/27/17  Yes Marletta Lor, MD  timolol (TIMOPTIC) 0.5 % ophthalmic solution Place 1 drop daily into both eyes. 06/29/17  Yes [provider]  traZODone (DESYREL) 100 MG tablet Take 100 mg at bedtime by mouth.  11/24/14  Yes [provider]    Physical Exam:  Constitutional: Elderly male in NAD, calm, comfortable Vitals:   07/26/17 2211 07/26/17 2230 07/26/17 2300 07/27/17 0000  BP: 137/68 (!) 144/91 138/64 (!) 154/72  Pulse:      Resp:   (!) 22   Temp:      TempSrc:      SpO2: 95% 96% 94% 96%   Eyes: PERRL, lids and conjunctivae normal ENMT: Mucous membranes are moist. Posterior pharynx clear of any exudate or lesions.  Neck:  mobile mass of the anterior neck(patient notes has been there since early childhood and unchanged) Respiratory: clear to auscultation bilaterally, no wheezing, no crackles. Normal respiratory effort. No accessory muscle use.  Cardiovascular: irregular rhythm, +SEM. no rubs / gallops. No extremity edema. 2+ pedal pulses. No carotid bruits.  Abdomen: no tenderness, no masses palpated. No hepatosplenomegaly. Bowel  sounds positive.  Musculoskeletal: no clubbing / cyanosis. No joint deformity upper and lower extremities. Good ROM, no contractures. Normal muscle tone.  Skin: no rashes, lesions, ulcers. No induration Neurologic: CN 2-12 grossly intact. Sensation intact, DTR normal. Strength 5/5 in all 4.  Romberg negative Psychiatric: Normal judgment and insight. Alert and oriented x 3. Normal mood.     Labs on Admission: I have personally reviewed following labs and imaging studies  CBC: Recent Labs  Lab 07/26/17 1947  WBC 7.4  NEUTROABS 4.6  HGB 14.9  HCT 41.9  MCV 88.6  PLT 409   Basic Metabolic Panel: Recent Labs  Lab 07/26/17 1941 07/26/17 1947  NA  --  140  K  --  3.6  CL  --  104  CO2  --  29  GLUCOSE  --  98  BUN  --  20  CREATININE  --  1.05  CALCIUM  --  9.6  MG 1.7  --    GFR: Estimated Creatinine Clearance: 64.8 mL/min (by C-G formula based on SCr of  1.05 mg/dL). Liver Function Tests: Recent Labs  Lab 07/26/17 1947  AST 28  ALT 32  ALKPHOS 83  BILITOT 0.6  PROT 7.1  ALBUMIN 3.9   No results for input(s): LIPASE, AMYLASE in the last 168 hours. No results for input(s): AMMONIA in the last 168 hours. Coagulation Profile: Recent Labs  Lab 07/26/17 1947  INR 1.03   Cardiac Enzymes: No results for input(s): CKTOTAL, CKMB, CKMBINDEX, TROPONINI in the last 168 hours. BNP (last 3 results) No results for input(s): PROBNP in the last 8760 hours. HbA1C: No results for input(s): HGBA1C in the last 72 hours. CBG: Recent Labs  Lab 07/26/17 1949 07/26/17 2042  GLUCAP 103* 89   Lipid Profile: No results for input(s): CHOL, HDL, LDLCALC, TRIG, CHOLHDL, LDLDIRECT in the last 72 hours. Thyroid Function Tests: No results for input(s): TSH, T4TOTAL, FREET4, T3FREE, THYROIDAB in the last 72 hours. Anemia Panel: No results for input(s): VITAMINB12, FOLATE, FERRITIN, TIBC, IRON, RETICCTPCT in the last 72 hours. Urine analysis:    Component Value Date/Time    BILIRUBINUR n 08/04/2014 1535   PROTEINUR n 08/04/2014 1535   UROBILINOGEN 0.2 08/04/2014 1535   NITRITE n 08/04/2014 1535   LEUKOCYTESUR Negative 08/04/2014 1535   Sepsis Labs: No results found for this or any previous visit (from the past 240 hour(s)).   Radiological Exams on Admission: Ct Head Wo Contrast  Result Date: 07/26/2017 CLINICAL DATA:  Intermittent dizziness for few months. Dizziness today. EXAM: CT HEAD WITHOUT CONTRAST TECHNIQUE: Contiguous axial images were obtained from the base of the skull through the vertex without intravenous contrast. COMPARISON:  MRI brain 08/12/2009 FINDINGS: Brain: Diffuse cerebral atrophy. Ventricular dilatation consistent with central atrophy. Low-attenuation changes in the deep white matter consistent with small vessel ischemia. No evidence of acute infarction, hemorrhage, hydrocephalus, extra-axial collection or mass lesion/mass effect. Vascular: Vertebrobasilar and internal carotid artery calcifications. Skull: The calvarium appears intact. Sinuses/Orbits: Mucosal thickening in the paranasal sinuses. No acute air-fluid levels. Mastoid air cells are not opacified. Other: None. IMPRESSION: No acute intracranial abnormalities. Chronic atrophy and small vessel ischemic changes. Electronically Signed   By: Lucienne Capers M.D.   On: 07/26/2017 21:55    EKG: Independently reviewed.  Sinus rhythm with frequent PVC present  Assessment/Plan Dizziness with fall: Acute.  Patient notes having significant issues with feeling off balance initial orthostatic vital signs appear to be negative and repeat positive. CT imaging of the brain was negative for any acute abnormalities.  Question the possibility of orthostatic hypotension vs acute stroke vs medication effect vs underlying infection vs other. - Admit to a telemetry bed - Neurochecks - Check TSH and urinalysis - Check MRI of the brain  - May want to repeat orthostatic vital signs in a.m. - Physical  therapy to eval and treat - Consider need of consultation to neurology in a.m.  Frequent PVCs: Patient notes previous history of heart murmur.  Magnesium level noted to be 1.7. - Give 1 g of magnesium sulfate  - Check echocardiogram - Follow-up telemetry overnight   Essential hypertension  - Continue amlodipine, losartan-hydrochlorothiazide   Anxiety: Patient reports being under a lot of social stressors at this time. - Continue Wellbutrin and Xanax prn  GERD - Pharmacy substitution of Protonix  BPH - Continue Proscar  DVT prophylaxis: lovenox Code Status: Full Family Communication: no family present bedside Disposition Plan: likely home if workup negative Consults called: none Admission status: Observation  Norval Morton MD [facility] Pager [phone]  If 7PM-7AM, please contact night-coverage [email or web address] Password [facility]  07/27/2017, 12:43 AM

## 2017-07-27 NOTE — Consult Note (Addendum)
Requesting Physician: Dr. Posey Pronto    Chief Complaint: Stroke  History obtained from:  Patient   Chart    HPI:                                                                                                                                         Jared [name] is an 76 y.o. male who has had intermittent dizzy spells for several months.  However on the seventh of this month (yesterday) patient was mowing his yard and then suddenly felt as though is losing his balance, lightheaded, dizzy, and as if he was going to pass out.  Apparently notes that he did stand up very quickly and at that point he lost his balance fell forward but denies passing out.   Apparently his daughter did feel his heartbeat and felt that it was irregular.  Felt that needs to be further worked up.  He has noted that he has been weaker than normal in addition he does feel as though he has noticed that as he gets older he is not able to do what he wants to do because it has had he is younger than he really is.  While in the emergency room "patient was found to be afebrile, pulse 51-131, respirations 12-22, blood pressure 135/70-156/94, O2 saturations maintained on room air.  Orthostatic vital signs were unremarkable.  Labs relatively within normal limits.  CT scan of the brain showed no acute abnormalities."  Patient is not on aspirin or Plavix.  While in the hospital MRI was obtained and showed a small acute infarct in left caudate body.  This reason neurology was consulted.    Date last known well: Unable to determine Time last known well: Unable to determine tPA Given: No: unknown actual time of stroke Modified Rankin: Rankin Score=0    Past Medical History:  Diagnosis Date  . ALLERGIC RHINITIS 10/05/2007  . ANXIETY 10/05/2007  . BENIGN PROSTATIC HYPERTROPHY 04/08/2009  . COLONIC POLYPS, HX OF 10/05/2007  . DEPRESSION 10/05/2007  . GERD 10/05/2007  . Heart murmur   . HYPERTENSION 10/05/2007  . NEPHROLITHIASIS, HX OF  10/05/2007  . SLEEP APNEA, OBSTRUCTIVE 10/02/2008    Past Surgical History:  Procedure Laterality Date  . APPENDECTOMY    . CATARACT EXTRACTION  2014   bilateral  . CHOLECYSTECTOMY    . FOOT SURGERY     left  . HERNIA REPAIR  2014  . WRIST FRACTURE SURGERY     right x2   left x1    Family History  Problem Relation Age of Onset  . Colon cancer Maternal Aunt 70   Social History:  reports that  has never smoked. he has never used smokeless tobacco. He reports that he does not drink alcohol or use drugs.  Allergies: No Known Allergies  Medications:  Prior to Admission:  Medications Prior to Admission  Medication Sig Dispense Refill Last Dose  . ALPRAZolam (XANAX) 0.5 MG tablet Take 0.5 mg 3 (three) times daily by mouth.    07/26/2017 at Unknown time  . amLODipine (NORVASC) 10 MG tablet TAKE 1 TABLET BY MOUTH EVERY DAY (Patient taking differently: TAKE 10 mg  TABLET BY MOUTH EVERY DAY) 90 tablet 0 07/26/2017 at Unknown time  . buPROPion (WELLBUTRIN SR) 150 MG 12 hr tablet Take 150 mg by mouth 2 (two) times daily.   07/26/2017 at Unknown time  . finasteride (PROSCAR) 5 MG tablet TAKE 1 TABLET (5 MG TOTAL) BY MOUTH DAILY. 90 tablet 1 07/26/2017 at Unknown time  . fluorometholone (FML) 0.1 % ophthalmic suspension Place 1 drop 3 (three) times daily into both eyes.  0 07/26/2017 at Unknown time  . losartan-hydrochlorothiazide (HYZAAR) 100-25 MG tablet TAKE 1 TABLET BY MOUTH EVERY DAY (Patient taking differently: TAKE 25 mg TABLET BY MOUTH EVERY DAY) 90 tablet 0 07/26/2017 at Unknown time  . omeprazole (PRILOSEC) 10 MG capsule TAKE ONE CAPSULE BY MOUTH EVERY DAY (Patient taking differently: TAKE 10 mg CAPSULE BY MOUTH EVERY DAY) 90 capsule 3 07/26/2017 at Unknown time  . timolol (TIMOPTIC) 0.5 % ophthalmic solution Place 1 drop daily into both eyes.  0 07/25/2017 at Unknown time    . traZODone (DESYREL) 100 MG tablet Take 100 mg at bedtime by mouth.    07/25/2017 at Unknown time   Scheduled: . amLODipine  10 mg Oral Daily  . buPROPion  150 mg Oral BID  . enoxaparin (LOVENOX) injection  40 mg Subcutaneous Q24H  . finasteride  5 mg Oral Daily  . fluorometholone  1 drop Both Eyes TID  . pantoprazole  40 mg Oral Daily  . timolol  1 drop Both Eyes Daily  . traZODone  100 mg Oral QHS   Continuous:   ROS:                                                                                                                                       History obtained from the patient  General ROS: negative for - chills, fatigue, fever, night sweats, weight gain or weight loss Psychological ROS: negative for - behavioral disorder, hallucinations, memory difficulties, mood swings or suicidal ideation Ophthalmic ROS: negative for - blurry vision, double vision, eye pain or loss of vision ENT ROS: negative for - epistaxis, nasal discharge, oral lesions, sore throat, tinnitus or vertigo Allergy and Immunology ROS: negative for - hives or itchy/watery eyes Hematological and Lymphatic ROS: negative for - bleeding problems, bruising or swollen lymph nodes Endocrine ROS: negative for - galactorrhea, hair pattern changes, polydipsia/polyuria or temperature intolerance Respiratory ROS: negative for - cough, hemoptysis, shortness of breath or wheezing Cardiovascular ROS: negative for - chest pain, dyspnea on exertion, edema or irregular heartbeat Gastrointestinal ROS: negative for - abdominal pain, diarrhea, hematemesis,  nausea/vomiting or stool incontinence Genito-Urinary ROS: negative for - dysuria, hematuria, incontinence or urinary frequency/urgency Musculoskeletal ROS: negative for - joint swelling or muscular weakness Neurological ROS: as noted in HPI Dermatological ROS: negative for rash and skin lesion changes  Neurologic Examination:                                                                                                       Blood pressure (!) 152/75, pulse 67, temperature 98 F (36.7 C), temperature source Oral, resp. rate 18, height 5\' 10"  (1.778 m), weight 84.6 kg (186 lb 9.6 oz), SpO2 94 %.  HEENT-  Normocephalic, no lesions, without obvious abnormality.  Normal external eye and conjunctiva.  Normal TM's bilaterally.  Normal auditory canals and external ears. Normal external nose, mucus membranes and septum.  Normal pharynx. Cardiovascular- regularly irregular rhythm, pulses palpable throughout   Lungs- chest clear, no wheezing, rales, normal symmetric air entry Abdomen- normal findings: bowel sounds normal Extremities- no edema Lymph-no adenopathy palpable Musculoskeletal-no joint tenderness, deformity or swelling Skin-warm and dry, no hyperpigmentation, vitiligo, or suspicious lesions  Neurological Examination Mental Status: Alert, oriented, thought content appropriate.  Speech fluent without evidence of aphasia.  Able to follow 3 step commands without difficulty. Cranial Nerves: II: Discs flat bilaterally; Visual fields grossly normal,  III,IV, VI: ptosis not present, extra-ocular motions intact bilaterally, pupils equal, round, reactive to light and accommodation V,VII: smile symmetric, facial light touch sensation normal bilaterally VIII: hearing normal bilaterally IX,X: uvula rises symmetrically XI: bilateral shoulder shrug XII: midline tongue extension Motor: Right : Upper extremity   5/5    Left:     Upper extremity   5/5  Lower extremity   5/5     Lower extremity   5/5 Tone and bulk:normal tone throughout; no atrophy noted Sensory: Pinprick and light touch intact throughout, bilaterally Deep Tendon Reflexes: 2+ and symmetric throughout Plantars: Right: downgoing   Left: downgoing Cerebellar: normal finger-to-nose, and normal heel-to-shin test Gait: Not tested       Lab Results: Basic Metabolic Panel: Recent Labs  Lab 07/26/17 1941  07/26/17 1947 07/27/17 0519  NA  --  140 140  K  --  3.6 3.2*  CL  --  104 106  CO2  --  29 26  GLUCOSE  --  98 135*  BUN  --  20 19  CREATININE  --  1.05 0.95  CALCIUM  --  9.6 8.8*  MG 1.7  --   --     Liver Function Tests: Recent Labs  Lab 07/26/17 1947  AST 28  ALT 32  ALKPHOS 83  BILITOT 0.6  PROT 7.1  ALBUMIN 3.9   No results for input(s): LIPASE, AMYLASE in the last 168 hours. No results for input(s): AMMONIA in the last 168 hours.  CBC: Recent Labs  Lab 07/26/17 1947 07/27/17 0519  WBC 7.4 5.7  NEUTROABS 4.6 2.9  HGB 14.9 13.5  HCT 41.9 38.7*  MCV 88.6 89.0  PLT 187 170    Cardiac Enzymes: Recent Labs  Lab 07/27/17 0016 07/27/17  [place] <0.03 <0.03    Lipid Panel: Recent Labs  Lab 07/27/17 0519  CHOL 162  TRIG 95  HDL 41  CHOLHDL 4.0  VLDL 19  LDLCALC 102*    CBG: Recent Labs  Lab 07/26/17 1949 07/26/17 2042  GLUCAP 103* 14    Microbiology: Results for orders placed or performed in visit on 07/16/14  Wound culture     Status: None   Collection Time: 07/16/14  2:24 PM  Result Value Ref Range Status   Gram Stain Rare  Final   Gram Stain WBC present-predominately Mononuclear  Final   Gram Stain No Squamous Epithelial Cells Seen  Final   Gram Stain No Organisms Seen  Final   Organism ID, Bacteria Multiple Organisms Present,None Predominant  Final    Comment: No Staphylococcus aureus isolated NO GROUP A STREP (S. PYOGENES) ISOLATED    Coagulation Studies: Recent Labs    07/26/17 1947  LABPROT 13.4  INR 1.03    Imaging: Dg Chest 2 View  Result Date: 07/27/2017 CLINICAL DATA:  Dizzy spells for several months. Syncopal episode today. Irregular heart rate. EXAM: CHEST  2 VIEW COMPARISON:  02/23/2014 FINDINGS: Mild cardiac enlargement. No edema or consolidation in the lungs. No blunting of costophrenic angles. No pneumothorax. Degenerative changes in the spine. Calcification of the aorta. IMPRESSION: Mild cardiac  enlargement.  No evidence of active pulmonary disease. Electronically Signed   By: Lucienne Capers M.D.   On: 07/27/2017 01:54   Ct Head Wo Contrast  Result Date: 07/26/2017 CLINICAL DATA:  Intermittent dizziness for few months. Dizziness today. EXAM: CT HEAD WITHOUT CONTRAST TECHNIQUE: Contiguous axial images were obtained from the base of the skull through the vertex without intravenous contrast. COMPARISON:  MRI brain 08/12/2009 FINDINGS: Brain: Diffuse cerebral atrophy. Ventricular dilatation consistent with central atrophy. Low-attenuation changes in the deep white matter consistent with small vessel ischemia. No evidence of acute infarction, hemorrhage, hydrocephalus, extra-axial collection or mass lesion/mass effect. Vascular: Vertebrobasilar and internal carotid artery calcifications. Skull: The calvarium appears intact. Sinuses/Orbits: Mucosal thickening in the paranasal sinuses. No acute air-fluid levels. Mastoid air cells are not opacified. Other: None. IMPRESSION: No acute intracranial abnormalities. Chronic atrophy and small vessel ischemic changes. Electronically Signed   By: Lucienne Capers M.D.   On: 07/26/2017 21:55   Mr Brain Wo Contrast  Result Date: 07/27/2017 CLINICAL DATA:  Ataxia and possible stroke. EXAM: MRI HEAD WITHOUT CONTRAST TECHNIQUE: Multiplanar, multiecho pulse sequences of the brain and surrounding structures were obtained without intravenous contrast. COMPARISON:  Head CT 07/26/2017 Brain MRI 08/12/2009 FINDINGS: Brain: The midline structures are normal. There is an acute infarct within the left caudate body measuring approximately 10 x 8 mm. No mass lesion, hydrocephalus, dural abnormality or extra-axial collection. There is multifocal white matter hyperintensity suggesting chronic ischemic microangiopathy. Old right basal ganglia lacunar infarct. Generalized volume loss without lobar predominance. Foci of chronic microhemorrhage in the brainstem and both thalami.  Vascular: Major intracranial arterial and venous sinus flow voids are preserved. Skull and upper cervical spine: The visualized skull base, calvarium, upper cervical spine and extracranial soft tissues are normal. Sinuses/Orbits: Moderate maxillary mucosal thickening. No fluid levels. Normal orbits. IMPRESSION: 1. Small acute infarct of the left caudate body. No hemorrhage or mass effect. 2. Chronic ischemic microangiopathy and generalized volume loss. Electronically Signed   By: Ulyses Jarred M.D.   On: 07/27/2017 03:21       Assessment and plan discussed with with attending physician and they are  in agreement.    Etta Quill PA-C [facility] [phone]  07/27/2017, 11:40 AM   Assessment: 76 y.o. male with a acute left caudate body infarct.  At this point the distribution is in a small vessel distribution.  I do not believe that this is the cause of patient's symptoms however given the fact that it is present patient would benefit from a full stroke workup.  I also believe patient should have a cardiological workup as he does have a regular irregular rhythm and has noticed that he oftentimes feels dizzy at random periods.  Stroke Risk Factors - hypertension  Recommend 1. HgbA1c, fasting lipid panel 2. MRA of the brain without contrast 3. PT consult, OT consult, Speech consult 4. Echocardiogram 5. 80 mg of Atorvistatin 6. Prophylactic therapy-Antiplatelet med: Aspirin 325 mg daily 7. Risk factor modification 8. Telemetry monitoring 9. Frequent neuro checks 10 NPO until passes stroke swallow screen 11 please page stroke NP  Or  PA  Or MD from 8am -4 pm  as this patient from this time will be  followed by the stroke.   You can look them up on [email or web address]  Password [facility]   12-cardiology consult with possible Holter monitor   NEUROHOSPITALIST ADDENDUM Seen and examined the patient this AM. Formulated plan as documented above. Recommendations as above. Will  follow.  Karena Addison Aroor MD [facility] [phone]  If 7pm to 7am, please call on call as listed on AMION.

## 2017-07-27 NOTE — ED Notes (Signed)
Patient transported to MRI 

## 2017-07-28 ENCOUNTER — Inpatient Hospital Stay (HOSPITAL_COMMUNITY): Payer: PPO

## 2017-07-28 DIAGNOSIS — I639 Cerebral infarction, unspecified: Principal | ICD-10-CM

## 2017-07-28 DIAGNOSIS — I255 Ischemic cardiomyopathy: Secondary | ICD-10-CM

## 2017-07-28 DIAGNOSIS — I420 Dilated cardiomyopathy: Secondary | ICD-10-CM

## 2017-07-28 DIAGNOSIS — I10 Essential (primary) hypertension: Secondary | ICD-10-CM

## 2017-07-28 DIAGNOSIS — R42 Dizziness and giddiness: Secondary | ICD-10-CM

## 2017-07-28 DIAGNOSIS — E785 Hyperlipidemia, unspecified: Secondary | ICD-10-CM

## 2017-07-28 LAB — CBC WITH DIFFERENTIAL/PLATELET
Basophils Absolute: 0 10*3/uL (ref 0.0–0.1)
Basophils Relative: 0 %
EOS ABS: 0.2 10*3/uL (ref 0.0–0.7)
Eosinophils Relative: 2 %
HEMATOCRIT: 40 % (ref 39.0–52.0)
HEMOGLOBIN: 14 g/dL (ref 13.0–17.0)
LYMPHS ABS: 2 10*3/uL (ref 0.7–4.0)
LYMPHS PCT: 23 %
MCH: 30.8 pg (ref 26.0–34.0)
MCHC: 35 g/dL (ref 30.0–36.0)
MCV: 87.9 fL (ref 78.0–100.0)
MONOS PCT: 11 %
Monocytes Absolute: 1 10*3/uL (ref 0.1–1.0)
NEUTROS PCT: 64 %
Neutro Abs: 5.6 10*3/uL (ref 1.7–7.7)
Platelets: 147 10*3/uL — ABNORMAL LOW (ref 150–400)
RBC: 4.55 MIL/uL (ref 4.22–5.81)
RDW: 12.1 % (ref 11.5–15.5)
WBC: 8.8 10*3/uL (ref 4.0–10.5)

## 2017-07-28 LAB — COMPREHENSIVE METABOLIC PANEL
ALT: 31 U/L (ref 17–63)
AST: 30 U/L (ref 15–41)
Albumin: 3.4 g/dL — ABNORMAL LOW (ref 3.5–5.0)
Alkaline Phosphatase: 78 U/L (ref 38–126)
Anion gap: 6 (ref 5–15)
BUN: 18 mg/dL (ref 6–20)
CHLORIDE: 103 mmol/L (ref 101–111)
CO2: 28 mmol/L (ref 22–32)
CREATININE: 1.01 mg/dL (ref 0.61–1.24)
Calcium: 9.2 mg/dL (ref 8.9–10.3)
GFR calc non Af Amer: >60 mL/min (ref >60)
Glucose, Bld: 115 mg/dL — ABNORMAL HIGH (ref 65–99)
POTASSIUM: 3.5 mmol/L (ref 3.5–5.1)
SODIUM: 137 mmol/L (ref 135–145)
Total Bilirubin: 1 mg/dL (ref 0.3–1.2)
Total Protein: 6.5 g/dL (ref 6.5–8.1)

## 2017-07-28 LAB — BRAIN NATRIURETIC PEPTIDE: B NATRIURETIC PEPTIDE 5: 282.7 pg/mL — AB (ref 0.0–100.0)

## 2017-07-28 LAB — MAGNESIUM: MAGNESIUM: 1.6 mg/dL — AB (ref 1.7–2.4)

## 2017-07-28 MED ORDER — IOPAMIDOL (ISOVUE-370) INJECTION 76%
INTRAVENOUS | Status: AC
  Administered 2017-07-28: 50 mL
  Filled 2017-07-28: qty 50

## 2017-07-28 MED ORDER — MAGNESIUM OXIDE 400 (241.3 MG) MG PO TABS: 400.0000 mg | ORAL_TABLET | Freq: Every day | ORAL | Status: DC

## 2017-07-28 MED ORDER — METOPROLOL TARTRATE 25 MG PO TABS: 25.0000 mg | ORAL_TABLET | Freq: Two times a day (BID) | ORAL | Status: DC

## 2017-07-28 MED ORDER — CARVEDILOL 6.25 MG PO TABS
6.2500 mg | ORAL_TABLET | Freq: Two times a day (BID) | ORAL | Status: DC
  Administered 2017-07-28 – 2017-07-31 (×4): 6.25 mg via ORAL
  Filled 2017-07-28 (×5): qty 1

## 2017-07-28 MED ORDER — MAGNESIUM OXIDE 400 (241.3 MG) MG PO TABS
400.0000 mg | ORAL_TABLET | Freq: Two times a day (BID) | ORAL | Status: DC
  Administered 2017-07-28 – 2017-07-30 (×6): 400 mg via ORAL
  Filled 2017-07-28 (×5): qty 1

## 2017-07-28 MED ORDER — POTASSIUM CHLORIDE CRYS ER 20 MEQ PO TBCR
40.0000 meq | EXTENDED_RELEASE_TABLET | ORAL | Status: AC
  Administered 2017-07-28 (×2): 40 meq via ORAL
  Filled 2017-07-28: qty 2

## 2017-07-28 MED ORDER — LOSARTAN POTASSIUM 50 MG PO TABS
25.0000 mg | ORAL_TABLET | Freq: Every day | ORAL | Status: DC
  Administered 2017-07-28: 25 mg via ORAL

## 2017-07-28 MED ORDER — MAGNESIUM SULFATE 2 GM/50ML IV SOLN
2.0000 g | Freq: Once | INTRAVENOUS | Status: AC
  Administered 2017-07-28: 2 g via INTRAVENOUS
  Filled 2017-07-28: qty 50

## 2017-07-28 NOTE — Progress Notes (Signed)
Physical Therapy Treatment Patient Details Name: Jared [name] MRN: [id number] DOB: 08-20-1941 Today's Date: 07/28/2017    History of Present Illness Pt is a 76 y.o. male presented with c/o dizziness s/p mechanical fall while mowing the lawn. Pt reports that he has had dizzy episodes periodically over last 3 weeks. 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    PT Comments    Pt making good progress towards his goals. Pt currently mod for transfers and min guard for ambulation of 600 feet without AD and ascent/descent of 5 steps. Pt stable with gait but continues to have a very narrow base of support putting him at increased risk for falls. Pt requires continued PT to progress balance training for safe navigation of his home environment at discharge.    Follow Up Recommendations  Home health PT;Supervision - Intermittent     Equipment Recommendations  Rolling walker with 5" wheels    Recommendations for Other Services       Precautions / Restrictions Precautions Precautions: Fall Precaution Comments: fallen out of tub x3 in last 3 months, fallen in yard x2  Restrictions Weight Bearing Restrictions: No    Mobility  Bed Mobility               General bed mobility comments: OOB in recliner  Transfers Overall transfer level: Needs assistance   Transfers: Sit to/from Stand Sit to Stand: Modified independent (Device/Increase time)            Ambulation/Gait Ambulation/Gait assistance: Min guard Ambulation Distance (Feet): 600 Feet Assistive device: None Gait Pattern/deviations: Step-through pattern;Scissoring;Narrow base of support Gait velocity: WFL Gait velocity interpretation: >2.62 ft/sec, indicative of independent community ambulator General Gait Details: min guard for safety, no LoB today, vc for  increased base of support to increased stability, cadence increased in todays session   Stairs Stairs: Yes   Stair Management: Two rails;Forwards;Alternating pattern;No rails Number of Stairs: 10 General stair comments: 5 steps with two rail assist, 5 steps with out rails, steady with both      Balance Overall balance assessment: Needs assistance Sitting-balance support: Feet supported;Feet unsupported;No upper extremity supported Sitting balance-Leahy Scale: Good     Standing balance support: No upper extremity supported;During functional activity Standing balance-Leahy Scale: Good Standing balance comment: does not require assist but would not withstand maximal challange to balance                            Cognition Arousal/Alertness: Awake/alert Behavior During Therapy: WFL for tasks assessed/performed Overall Cognitive Status: Within Functional Limits for tasks assessed                                           General Comments General comments (skin integrity, edema, etc.): Pt daughter present during session today      Pertinent Vitals/Pain Pain Assessment: No/denies pain    Home Living Family/patient expects to be discharged to:: Private residence Living Arrangements: Alone Available Help at Discharge: Family;Friend(s);Available PRN/intermittently Type of Home: House       Home Equipment: Kasandra Knudsen - single point      Prior Function Level of Independence: Independent  PT Goals (current goals can now be found in the care plan section) Acute Rehab PT Goals Patient Stated Goal: go home PT Goal Formulation: With patient/family Time For Goal Achievement: 08/10/17 Potential to Achieve Goals: Good Progress towards PT goals: Progressing toward goals    Frequency    Min 4X/week      PT Plan Current plan remains appropriate    Co-evaluation              AM-PAC PT "6 Clicks" Daily Activity  Outcome Measure   Difficulty turning over in bed (including adjusting bedclothes, sheets and blankets)?: A Little Difficulty moving from lying on back to sitting on the side of the bed? : A Little Difficulty sitting down on and standing up from a chair with arms (e.g., wheelchair, bedside commode, etc,.)?: None Help needed moving to and from a bed to chair (including a wheelchair)?: None Help needed walking in hospital room?: None Help needed climbing 3-5 steps with a railing? : None 6 Click Score: 22    End of Session Equipment Utilized During Treatment: Gait belt Activity Tolerance: Patient tolerated treatment well Patient left: in chair;with call bell/phone within reach;with chair alarm set;with family/visitor present Nurse Communication: Mobility status PT Visit Diagnosis: Unsteadiness on feet (R26.81);Other abnormalities of gait and mobility (R26.89);Repeated falls (R29.6);Muscle weakness (generalized) (M62.81);History of falling (Z91.81);Ataxic gait (R26.0);Difficulty in walking, not elsewhere classified (R26.2);Other symptoms and signs involving the nervous system (Q76.226)     Time: 3335-4562 PT Time Calculation (min) (ACUTE ONLY): 17 min  Charges:  $Gait Training: 8-22 mins                    G Codes:       Claudy Abdallah B. Migdalia Dk PT, DPT Acute Rehabilitation  [phone] Pager [phone]     [place] 07/28/2017, 4:36 PM

## 2017-07-28 NOTE — Consult Note (Signed)
Cardiology Consultation:   Patient ID: Jared [name]; [id number]; 1941-08-22   Admit date: 07/26/2017 Date of Consult: 07/28/2017  Primary Care Provider: Marletta Lor, MD Primary Cardiologist: new - Dr. Stanford Breed Primary Electrophysiologist:     Patient Profile:   Jared [name] is a 76 y.o. male with a hx of HTN, frequent PVCs, anxiety, GERD, and OSA who is being seen today for the evaluation of new onset systolic and diastolic heart failure at the request of Dr. Posey Pronto.  History of Present Illness:   Jared [name] reported to [facility] with complaints of dizziness and general feeling of "off-balance."  Neurology was consulted and found acute infarct on brain imaging suggesting stroke. Stroke workup planned, including echocardiogram. Echo revealed new onset systolic and diastolic heart failure. Cardiology was consulted. He also has frequent PVCs on telemetry in the setting of low Mg.   On my interview, he states that he has felt "out of equilibrium" for the past 3-4 days. He was mowing his grass yesterday and the mower seemed to get away from him and he fell over the mower. He did not sustain any injury. He called his PCP's office who advised him to report to the ED. He states his pressure has been running higher than normal over the past 3 days despite no changes in his medications. He denies chest pain, palpitations, SOB, and syncope. He remembers having stress tests several years ago and they were all normal.    Past Medical History:  Diagnosis Date  . ALLERGIC RHINITIS 10/05/2007  . ANXIETY 10/05/2007  . BENIGN PROSTATIC HYPERTROPHY 04/08/2009  . COLONIC POLYPS, HX OF 10/05/2007  . DEPRESSION 10/05/2007  . GERD 10/05/2007  . Heart murmur   . HYPERTENSION 10/05/2007  . NEPHROLITHIASIS, HX OF 10/05/2007  . SLEEP APNEA, OBSTRUCTIVE 10/02/2008    Past Surgical History:  Procedure Laterality Date  . APPENDECTOMY    . CATARACT EXTRACTION  2014   bilateral  . CHOLECYSTECTOMY    . FOOT SURGERY      left  . HERNIA REPAIR  2014  . WRIST FRACTURE SURGERY     right x2   left x1     Home Medications:  Prior to Admission medications   Medication Sig Start Date End Date Taking? Authorizing Provider  ALPRAZolam Duanne Moron) 0.5 MG tablet Take 0.5 mg 3 (three) times daily by mouth.  10/17/11  Yes [provider]  amLODipine (NORVASC) 10 MG tablet TAKE 1 TABLET BY MOUTH EVERY DAY Patient taking differently: TAKE 10 mg  TABLET BY MOUTH EVERY DAY 06/01/17  Yes Marletta Lor, MD  buPROPion [facility] SR) 150 MG 12 hr tablet Take 150 mg by mouth 2 (two) times daily.   Yes [provider]  finasteride (PROSCAR) 5 MG tablet TAKE 1 TABLET (5 MG TOTAL) BY MOUTH DAILY. 07/26/17  Yes Marletta Lor, MD  fluorometholone (FML) 0.1 % ophthalmic suspension Place 1 drop 3 (three) times daily into both eyes. 04/26/17  Yes [provider]  losartan-hydrochlorothiazide (HYZAAR) 100-25 MG tablet TAKE 1 TABLET BY MOUTH EVERY DAY Patient taking differently: TAKE 25 mg TABLET BY MOUTH EVERY DAY 06/01/17  Yes Marletta Lor, MD  omeprazole (PRILOSEC) 10 MG capsule TAKE ONE CAPSULE BY MOUTH EVERY DAY Patient taking differently: TAKE 10 mg CAPSULE BY MOUTH EVERY DAY 02/27/17  Yes Marletta Lor, MD  timolol (TIMOPTIC) 0.5 % ophthalmic solution Place 1 drop daily into both eyes. 06/29/17  Yes [provider]  traZODone (  DESYREL) 100 MG tablet Take 100 mg at bedtime by mouth.  11/24/14  Yes [provider]    Inpatient Medications: Scheduled Meds: . aspirin EC  325 mg Oral Daily  . atorvastatin  40 mg Oral q1800  . buPROPion  150 mg Oral BID  . enoxaparin (LOVENOX) injection  40 mg Subcutaneous Q24H  . finasteride  5 mg Oral Daily  . fluorometholone  1 drop Both Eyes TID  . pantoprazole  40 mg Oral Daily  . timolol  1 drop Both Eyes Daily  . traZODone  100 mg Oral QHS   Continuous Infusions:  PRN Meds: acetaminophen **OR** acetaminophen (TYLENOL)  oral liquid 160 mg/5 mL **OR** acetaminophen, ALPRAZolam  Allergies:   No Known Allergies  Social History:   Social History   Socioeconomic History  . Marital status: Widowed    Spouse name: Not on file  . Number of children: Not on file  . Years of education: Not on file  . Highest education level: Not on file  Social Needs  . Financial resource strain: Not on file  . Food insecurity - worry: Not on file  . Food insecurity - inability: Not on file  . Transportation needs - medical: Not on file  . Transportation needs - non-medical: Not on file  Occupational History  . Not on file  Tobacco Use  . Smoking status: Never Smoker  . Smokeless tobacco: Never Used  Substance and Sexual Activity  . Alcohol use: No  . Drug use: No  . Sexual activity: Not on file  Other Topics Concern  . Not on file  Social History Narrative  . Not on file    Family History:    Family History  Problem Relation Age of Onset  . Colon cancer Maternal Aunt 107     ROS:  Please see the history of present illness.  ROS  All other ROS reviewed and negative.     Physical Exam/Data:   Vitals:   07/28/17 0100 07/28/17 0400 07/28/17 1027 07/28/17 1346  BP: (!) 138/56 (!) 137/57 (!) 137/53 (!) 143/60  Pulse: 89 62 (!) 55 79  Resp: 18 18  18   Temp: 98.6 F (37 C) 99.3 F (37.4 C) 99 F (37.2 C) 97.8 F (36.6 C)  TempSrc: Oral Oral Oral Oral  SpO2: 93% 95% 97% 96%  Weight:      Height:        Intake/Output Summary (Last 24 hours) at 07/28/2017 1410 Last data filed at 07/28/2017 0700 Gross per 24 hour  Intake 640 ml  Output 850 ml  Net -210 ml   Filed Weights   07/27/17 0530  Weight: 186 lb 9.6 oz (84.6 kg)   Body mass index is 26.77 kg/m.  General:  Well nourished, well developed, in no acute distress HEENT: normal Neck: no JVD Vascular: No carotid bruits Cardiac:  normal S1, S2; RRR; no murmur  Lungs:  clear to auscultation bilaterally, no wheezing, rhonchi or rales  Abd: soft,  nontender, no hepatomegaly  Ext: trace edema Musculoskeletal:  No deformities, BUE and BLE strength normal and equal Skin: warm and dry  Neuro:  CNs 2-12 intact, no focal abnormalities noted Psych:  Normal affect   EKG:  The EKG was personally reviewed and demonstrates:  Sinus with PVCs Telemetry:  Telemetry was personally reviewed and demonstrates:  Sinus, frequent PVCs  Relevant CV Studies:  Echocardiogram 07/26/17: Study Conclusions - Left ventricle: The cavity size was normal. Wall thickness was  normal. Systolic function was moderately reduced. The estimated   ejection fraction was in the range of 35% to 40%. Mild diffuse   hypokinesis with no identifiable regional variations. Features   are consistent with a pseudonormal left ventricular filling   pattern, with concomitant abnormal relaxation and increased   filling pressure (grade 2 diastolic dysfunction). - Mitral valve: There was mild regurgitation. - Left atrium: The atrium was mildly dilated. - Right atrium: The atrium was mildly dilated. - Pulmonary arteries: Systolic pressure was mildly increased. PA   peak pressure: 34 mm Hg (S).   Laboratory Data:  Chemistry Recent Labs  Lab 07/26/17 1947 07/27/17 0519 07/28/17 0337  NA 140 140 137  K 3.6 3.2* 3.5  CL 104 106 103  CO2 29 26 28   GLUCOSE 98 135* 115*  BUN 20 19 18   CREATININE 1.05 0.95 1.01  CALCIUM 9.6 8.8* 9.2  GFRNONAA >60 >60 >60  GFRAA >60 >60 >60  ANIONGAP 7 8 6     Recent Labs  Lab 07/26/17 1947 07/28/17 0337  PROT 7.1 6.5  ALBUMIN 3.9 3.4*  AST 28 30  ALT 32 31  ALKPHOS 83 78  BILITOT 0.6 1.0   Hematology Recent Labs  Lab 07/26/17 1947 07/27/17 0519 07/28/17 0337  WBC 7.4 5.7 8.8  RBC 4.73 4.35 4.55  HGB 14.9 13.5 14.0  HCT 41.9 38.7* 40.0  MCV 88.6 89.0 87.9  MCH 31.5 31.0 30.8  MCHC 35.6 34.9 35.0  RDW 12.3 12.5 12.1  PLT 187 170 147*   Cardiac Enzymes Recent Labs  Lab 07/27/17 0016 07/27/17 0519 07/27/17 1254    TROPONINI <0.03 <0.03 <0.03    Recent Labs  Lab 07/26/17 1953  TROPIPOC 0.00    BNPNo results for input(s): BNP, PROBNP in the last 168 hours.  DDimer No results for input(s): DDIMER in the last 168 hours.  Radiology/Studies:  Ct Angio Head W Or Wo Contrast  Result Date: 07/28/2017 CLINICAL DATA:  Stroke follow-up. EXAM: CT ANGIOGRAPHY HEAD AND NECK TECHNIQUE: Multidetector CT imaging of the head and neck was performed using the standard protocol during bolus administration of intravenous contrast. Multiplanar CT image reconstructions and MIPs were obtained to evaluate the vascular anatomy. Carotid stenosis measurements (when applicable) are obtained utilizing NASCET criteria, using the distal internal carotid diameter as the denominator. CONTRAST:  31mL ISOVUE-370 IOPAMIDOL (ISOVUE-370) INJECTION 76% COMPARISON:  Head CT from 2 days prior.  Brain MRI from yesterday. FINDINGS: CT HEAD FINDINGS Brain: Known acute small vessel infarct along the left corona radiata and caudate body. No evidence of progression or hemorrhagic conversion. Chronic small vessel ischemia and atrophy with ventriculomegaly. Vascular: See below Skull: No acute or aggressive finding Sinuses: Moderate chronic sinusitis. Orbits: Bilateral cataract resection. Review of the MIP images confirms the above findings CTA NECK FINDINGS Aortic arch: Atherosclerotic calcification. Aortic tortuosity without dilatation or stenosis. Right carotid system: No stenosis, ulceration, or dissection. No notable atheromatous changes. Left carotid system: Minimal atheromatous plaque at the ICA bulb. No stenosis or ulceration. Vertebral arteries: No proximal subclavian stenosis. Both vertebral arteries are smooth and widely patent to the dura. Skeleton: Spondylosis and degenerative facet spurring. 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 Upper chest: No acute or aggressive finding. Review of the MIP images  confirms the above findings CTA HEAD FINDINGS Anterior circulation: Mild atherosclerotic plaque on the carotid siphons. Vessels are smooth and widely patent. Negative for aneurysm. Posterior circulation: Atherosclerotic calcification on the  V4 segments. No stenosis or branch occlusion. Negative for aneurysm. Mild vertebrobasilar tortuosity. Venous sinuses: As permitted by contrast timing, patent. Anatomic variants: None unusual. Delayed phase: No abnormal intracranial enhancement. Review of the MIP images confirms the above findings IMPRESSION: No branch occlusion, stenosis, or visible embolic source. Atherosclerosis is mild for age. Electronically Signed   By: Monte Fantasia M.D.   On: 07/28/2017 09:33   Dg Chest 2 View  Result Date: 07/27/2017 CLINICAL DATA:  Dizzy spells for several months. Syncopal episode today. Irregular heart rate. EXAM: CHEST  2 VIEW COMPARISON:  02/23/2014 FINDINGS: Mild cardiac enlargement. No edema or consolidation in the lungs. No blunting of costophrenic angles. No pneumothorax. Degenerative changes in the spine. Calcification of the aorta. IMPRESSION: Mild cardiac enlargement.  No evidence of active pulmonary disease. Electronically Signed   By: Lucienne Capers M.D.   On: 07/27/2017 01:54   Ct Head Wo Contrast  Result Date: 07/26/2017 CLINICAL DATA:  Intermittent dizziness for few months. Dizziness today. EXAM: CT HEAD WITHOUT CONTRAST TECHNIQUE: Contiguous axial images were obtained from the base of the skull through the vertex without intravenous contrast. COMPARISON:  MRI brain 08/12/2009 FINDINGS: Brain: Diffuse cerebral atrophy. Ventricular dilatation consistent with central atrophy. Low-attenuation changes in the deep white matter consistent with small vessel ischemia. No evidence of acute infarction, hemorrhage, hydrocephalus, extra-axial collection or mass lesion/mass effect. Vascular: Vertebrobasilar and internal carotid artery calcifications. Skull: The calvarium  appears intact. Sinuses/Orbits: Mucosal thickening in the paranasal sinuses. No acute air-fluid levels. Mastoid air cells are not opacified. Other: None. IMPRESSION: No acute intracranial abnormalities. Chronic atrophy and small vessel ischemic changes. Electronically Signed   By: Lucienne Capers M.D.   On: 07/26/2017 21:55   Ct Angio Neck W Or Wo Contrast  Result Date: 07/28/2017 CLINICAL DATA:  Stroke follow-up. EXAM: CT ANGIOGRAPHY HEAD AND NECK TECHNIQUE: Multidetector CT imaging of the head and neck was performed using the standard protocol during bolus administration of intravenous contrast. Multiplanar CT image reconstructions and MIPs were obtained to evaluate the vascular anatomy. Carotid stenosis measurements (when applicable) are obtained utilizing NASCET criteria, using the distal internal carotid diameter as the denominator. CONTRAST:  90mL ISOVUE-370 IOPAMIDOL (ISOVUE-370) INJECTION 76% COMPARISON:  Head CT from 2 days prior.  Brain MRI from yesterday. FINDINGS: CT HEAD FINDINGS Brain: Known acute small vessel infarct along the left corona radiata and caudate body. No evidence of progression or hemorrhagic conversion. Chronic small vessel ischemia and atrophy with ventriculomegaly. Vascular: See below Skull: No acute or aggressive finding Sinuses: Moderate chronic sinusitis. Orbits: Bilateral cataract resection. Review of the MIP images confirms the above findings CTA NECK FINDINGS Aortic arch: Atherosclerotic calcification. Aortic tortuosity without dilatation or stenosis. Right carotid system: No stenosis, ulceration, or dissection. No notable atheromatous changes. Left carotid system: Minimal atheromatous plaque at the ICA bulb. No stenosis or ulceration. Vertebral arteries: No proximal subclavian stenosis. Both vertebral arteries are smooth and widely patent to the dura. Skeleton: Spondylosis and degenerative facet spurring. Other neck: Simple appearing lipoma anterior to trachea and larynx,  nearly 5 x 3 cm. Upper chest: No acute or aggressive finding. Review of the MIP images confirms the above findings CTA HEAD FINDINGS Anterior circulation: Mild atherosclerotic plaque on the carotid siphons. Vessels are smooth and widely patent. Negative for aneurysm. Posterior circulation: Atherosclerotic calcification on the V4 segments. No stenosis or branch occlusion. Negative for aneurysm. Mild vertebrobasilar tortuosity. Venous sinuses: As permitted by contrast timing, patent. Anatomic variants: None unusual. Delayed phase: No  abnormal intracranial enhancement. Review of the MIP images confirms the above findings IMPRESSION: No branch occlusion, stenosis, or visible embolic source. Atherosclerosis is mild for age. Electronically Signed   By: Monte Fantasia M.D.   On: 07/28/2017 09:33   Mr Brain Wo Contrast  Result Date: 07/27/2017 CLINICAL DATA:  Ataxia and possible stroke. EXAM: MRI HEAD WITHOUT CONTRAST TECHNIQUE: Multiplanar, multiecho pulse sequences of the brain and surrounding structures were obtained without intravenous contrast. COMPARISON:  Head CT 07/26/2017 Brain MRI 08/12/2009 FINDINGS: Brain: The midline structures are normal. There is an acute infarct within the left caudate body measuring approximately 10 x 8 mm. No mass lesion, hydrocephalus, dural abnormality or extra-axial collection. There is multifocal white matter hyperintensity suggesting chronic ischemic microangiopathy. Old right basal ganglia lacunar infarct. Generalized volume loss without lobar predominance. Foci of chronic microhemorrhage in the brainstem and both thalami. Vascular: Major intracranial arterial and venous sinus flow voids are preserved. Skull and upper cervical spine: The visualized skull base, calvarium, upper cervical spine and extracranial soft tissues are normal. Sinuses/Orbits: Moderate maxillary mucosal thickening. No fluid levels. Normal orbits. IMPRESSION: 1. Small acute infarct of the left caudate body.  No hemorrhage or mass effect. 2. Chronic ischemic microangiopathy and generalized volume loss. Electronically Signed   By: Ulyses Jarred M.D.   On: 07/27/2017 03:21    Assessment and Plan:   1. Cardiomyopathy, new onset systolic and diastolic heart failure by echo Echocardiogram showed new reduced EF of 35-40% and grade 2 DD and a PA pressure of 34 mmHg. He has complaints of dizziness and feeling off-balance over the past few days. BNP pending. Pt does not appear to be in an exacerbation and is not extremely volume overloaded.  Recommend myoview stress test to rule out ischemic causes. Consider low dose beta blocker and continue home losartan.    2. Sinus, frequent PVCs - Mg was 1.7 on admission, was replaced with IV Mg - repeat Mg was 1.6, replaced again with IV Mg - added magox - will consider event monitor as outpatient as possible etiology for his dizziness   3. Dizziness, stroke with acute infarct on brain imaging - has been evaluated by neurology - frequent PVCs may be playing a role - would consider event monitor outpatient    4. HTN - home meds include losartan-HCTZ and norvasc   5. Anxiety - home meds include wellbutrin and trazodone - he has been under financial stress recenlty   For questions or updates, please contact [facility] Please consult [email or web address] for contact info under Cardiology/STEMI.   Signed, Tami Lin Duke, PA  07/28/2017 2:10 PM As above, patient seen and examined. Briefly he is a 76 year old male with past medical history of PVCs, hypertension, gastroesophageal reflux disease, obstructive sleep apnea for evaluation of cardiomyopathy. Patient was admitted after complaining of being off balance. He was mowing and nearly fell. He was admitted and found to have a small acute infarct in the left caudate body. He has been found to have reduced LV function and cardiology asked to evaluate. Patient does describe occasional throat tightness which  improves with exertion. He denies dyspnea on exertion, orthopnea, PND or pedal edema. He does not have palpitations or syncope. Electrocardiogram shows sinus rhythm, PACs, PVCs, left anterior fascicular block and left ventricular hypertrophy. Echocardiogram shows moderate reduction in LV function with ejection fraction 35-40%, mild mitral regurgitation and mild biatrial enlargement. Telemetry reviewed and shows frequent PVCs and 3 beats nonsustained ventricular tachycardia.  1 cardiomyopathy-etiology unclear. TSH  is normal. No history of alcohol abuse. He does not have ischemic sounding chest pain but will arrange Gerster nuclear study to screen for coronary artery disease. He is noted to have frequent PVCs which could be contributing to his cardiomyopathy. We'll add Coreg 6.25 mg twice a day. Add losartan 25 mg daily; titrate meds as tolerated by pulse and BP. If ischemia workup negative would consider Holter monitor as an outpatient to look at burden of PVCs to see if ectopy may be causing.  2 CVA-per neurology notes we will arrange a transesophageal echocardiogram on Monday to rule out source of embolus. If negative will arrange implantable loop monitor to screen for atrial fibrillation.  3 Hypertension-we will control blood pressure with Coreg and Cozaarwhich will also treat his cardiac myopathy.  4 frequent ectopy-add beta blockade and follow on telemetry. May be contributing to cardiomyopathy.

## 2017-07-28 NOTE — Consult Note (Signed)
[facility] CM Primary Care Navigator  07/28/2017  Jared [name] 1940/10/15 [id number]   Met with patient at the bedside to identify possible discharge needs.  Patientreports that he lost his balance and had a fall in the yard thathad led to this admission.  Patient endorses thatprimary care provider is Dr. Bluford Kaufmann with [facility].  Patient states using [facility] in [place] to obtain medications without difficulty. Patient reports managing hismedications at home straight out of the containers. Patient verbalized that he was driving prior to admission, but daughter Jared [name]) willbe able toprovidetransportation to his doctor's appointments after discharge.  Patient lives alone and independent prior to this admission. He is the primary caregiverfor himself at [employer] stated.Patient reports that he has a list of agencies to call if needing any assistance at home.  Anticipated discharge plan is home per patient with home health services per therapy recommendation.  Patient expressed understanding to call primary care provider's office when he getshome, for a post discharge follow-up appointment within a week or sooner if needed. Patient letter (with PCP's contact number) was provided as a reminder. He mentioned that he has a scheduled appointment to see primary care provider next week and he was encouraged to keep the schedule and attend it.  Explained to patient about [facility] CM services available for health management at home. He opted and verbally agreed forEMMI strokecalls tofollow him up as he recovers.   Referral made for EMMI stroke calls after discharge.  Patient had voiced understandingto seek referral from primary care provider to [facility] care management ifnecessaryand deemed appropriate for services in the future.  [facility] care management information provided for future needs that he may have.   For additional  questions please contact:  Edwena Felty A. Jessicca Stitzer, BSN, RN-BC [facility] PRIMARY CARE Navigator Cell: [phone]

## 2017-07-28 NOTE — Evaluation (Signed)
Occupational Therapy Evaluation Patient Details Name: Jared [name] MRN: [id number] DOB: [birth year] Today's Date: 07/28/2017    History of Present Illness Pt is a 76 y.o. male presented with c/o dizziness s/p mechanical fall while mowing the lawn. Pt reports that he has had dizzy episodes periodically over last 3 weeks. 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   Clinical Impression   Pt. Was able to perform ADLs at I level. Pt. Was I to amb into bathroom and perform transfers. Pt. Has had several falls at home and would benefit from home safety evaluation. Pt. Is an agreement with HHOT.     Follow Up Recommendations  Home health OT    Equipment Recommendations       Recommendations for Other Services       Precautions / Restrictions Precautions Precautions: Fall Precaution Comments: fallen out of tub x3 in last 3 months, fallen in yard x2  Restrictions Weight Bearing Restrictions: No      Mobility Bed Mobility                  Transfers     Transfers: Stand Pivot Transfers Sit to Stand: Modified independent (Device/Increase time) Stand pivot transfers: Modified independent (Device/Increase time)            Balance                                           ADL either performed or assessed with clinical judgement   ADL Overall ADL's : At baseline                                       General ADL Comments: Pt. would benefit from home safety evaluation to increase safety at d/c. Pt. reports a fall while getting out of the shower.      Vision Baseline Vision/History: Wears glasses Wears Glasses: Reading only       Perception     Praxis      Pertinent Vitals/Pain Pain Assessment: No/denies pain     Hand Dominance Right   Extremity/Trunk Assessment  Upper Extremity Assessment Upper Extremity Assessment: Overall WFL for tasks assessed           Communication Communication Communication: No difficulties   Cognition Arousal/Alertness: Awake/alert Behavior During Therapy: WFL for tasks assessed/performed Overall Cognitive Status: Within Functional Limits for tasks assessed                                     General Comments       Exercises     Shoulder Instructions      Home Living Family/patient expects to be discharged to:: Private residence Living Arrangements: Alone Available Help at Discharge: Family;Friend(s);Available PRN/intermittently Type of Home: House             Bathroom Shower/Tub: Teacher, early years/pre: Standard     Home Equipment: Cane - single point          Prior Functioning/Environment Level of Independence: Independent  OT Problem List:        OT Treatment/Interventions:      OT Goals(Current goals can be found in the care plan section) Acute Rehab OT Goals Patient Stated Goal: go home  OT Frequency:     Barriers to D/C:            Co-evaluation              AM-PAC PT "6 Clicks" Daily Activity     Outcome Measure Help from another person eating meals?: None Help from another person taking care of personal grooming?: None Help from another person toileting, which includes using toliet, bedpan, or urinal?: None Help from another person bathing (including washing, rinsing, drying)?: None Help from another person to put on and taking off regular upper body clothing?: None Help from another person to put on and taking off regular lower body clothing?: None 6 Click Score: 24   End of Session Nurse Communication: (ik therapy)  Activity Tolerance: Patient tolerated treatment well Patient left: in chair                   Time: 1829-9371 OT Time Calculation (min): 23 min Charges:  OT General Charges $OT Visit: 1  Visit OT Evaluation $OT Eval Low Complexity: 1 Low OT Treatments $Self Care/Home Management : 8-22 mins G-Codes:     6 clicks  Zayan Delvecchio 07/28/2017, 12:00 PM

## 2017-07-28 NOTE — Progress Notes (Signed)
[facility] Progress Note  Patient: Jared [name] ION:[id number]   PCP: Marletta Lor, MD DOB: 06-04-1941   DOA: 07/26/2017   DOS: 07/28/2017   Date of Service: the patient was seen and examined on 07/28/2017  Subjective: Feeling better, no nausea no vomiting no fever no chills.  No further worsening of his symptoms.  Brief hospital course: Pt. with PMH of HTN, anxiety, BPH; admitted on 07/26/2017, presented with complaint of dizziness, was found to have acute CVA as well as acute cardiomyopathy. Currently further plan is further workup for stroke as well as cardiomyopathy.  Assessment and Plan: 1.  Acute left caudate body CVA. Likely small vessel disease but possibility of embolus cannot be ruled out. Echocardiogram shows 35-40% EF. LDL 102 on Lipitor 40 mg. On aspirin 325 mg as well. PT OT recommends home with home health. CTA head and neck shows no acute stenosis or obstruction. Symptoms are improving right now. Hemoglobin A1c 5.3. Speech therapy recommends no further therapy requirements as well. She will require a TEE to rule out any possibility of embolus as well as may require loop recorder which will all be arranged on Monday. Monitor over the weekend on telemetry.  2.  Acute cardiomyopathy, systolic dysfunction without any CHF. Echocardiogram shows EF of 24-40%, grade 2 diastolic dysfunction. No specific mention about wall motion abnormality. Patient did not have any chest pain or any ACS symptoms in the past. Cardiology consulted, recommend inpatient stress test. Patient will be started on Coreg as well as losartan. Patient does have bradycardia as well as symptomatic PVCs continue to monitor on telemetry overnight.  3.  Essential hypertension. Patient will be started on Coreg and losartan per cardiology. Monitor blood pressure.  4.  Chronic neck lesion. Simple appearing lipoma based on the CT scan. No further workup recommended at present.  5.  Hypokalemia  and hypomagnesemia. Replacing now.  Diet: cadiac diet DVT Prophylaxis: subcutaneous Heparin  Advance goals of care discussion: full code  Family Communication: no family was present at bedside, at the time of interview.  Disposition:  Discharge to home with home health.  Consultants: cardiology neurology  Procedures: Echocardiogram   Antibiotics: Anti-infectives (From admission, onward)   None       Objective: Physical Exam: Vitals:   07/28/17 0100 07/28/17 0400 07/28/17 1027 07/28/17 1346  BP: (!) 138/56 (!) 137/57 (!) 137/53 (!) 143/60  Pulse: 89 62 (!) 55 79  Resp: 18 18  18   Temp: 98.6 F (37 C) 99.3 F (37.4 C) 99 F (37.2 C) 97.8 F (36.6 C)  TempSrc: Oral Oral Oral Oral  SpO2: 93% 95% 97% 96%  Weight:      Height:        Intake/Output Summary (Last 24 hours) at 07/28/2017 1607 Last data filed at 07/28/2017 0700 Gross per 24 hour  Intake 640 ml  Output 850 ml  Net -210 ml   Filed Weights   07/27/17 0530  Weight: 84.6 kg (186 lb 9.6 oz)   General: Alert, Awake and Oriented to Time, Place and Person. Appear in mild distress, affect appropriate Eyes: PERRL, Conjunctiva normal ENT: Oral Mucosa clear moist. Neck: no JVD, no Abnormal Mass Or lumps Cardiovascular: S1 and S2 Present, no Murmur, Peripheral Pulses Present Respiratory: normal respiratory effort, Bilateral Air entry equal and Decreased, no use of accessory muscle, Clear to Auscultation, no Crackles, no wheezes Abdomen: Bowel Sound present, Soft and no tenderness, no hernia Skin: no redness, no Rash, no induration Extremities: no  Pedal edema, no calf tenderness Neurologic: Grossly no focal neuro deficit. Bilaterally Equal motor strength  Data Reviewed: CBC: Recent Labs  Lab 07/26/17 1947 07/27/17 0519 07/28/17 0337  WBC 7.4 5.7 8.8  NEUTROABS 4.6 2.9 5.6  HGB 14.9 13.5 14.0  HCT 41.9 38.7* 40.0  MCV 88.6 89.0 87.9  PLT 187 170 644*   Basic Metabolic Panel: Recent Labs  Lab  07/26/17 1941 07/26/17 1947 07/27/17 0519 07/28/17 0337  NA  --  140 140 137  K  --  3.6 3.2* 3.5  CL  --  104 106 103  CO2  --  29 26 28   GLUCOSE  --  98 135* 115*  BUN  --  20 19 18   CREATININE  --  1.05 0.95 1.01  CALCIUM  --  9.6 8.8* 9.2  MG 1.7  --   --  1.6*    Liver Function Tests: Recent Labs  Lab 07/26/17 1947 07/28/17 0337  AST 28 30  ALT 32 31  ALKPHOS 83 78  BILITOT 0.6 1.0  PROT 7.1 6.5  ALBUMIN 3.9 3.4*   No results for input(s): LIPASE, AMYLASE in the last 168 hours. No results for input(s): AMMONIA in the last 168 hours. Coagulation Profile: Recent Labs  Lab 07/26/17 1947  INR 1.03   Cardiac Enzymes: Recent Labs  Lab 07/27/17 0016 07/27/17 0519 07/27/17 1254  TROPONINI <0.03 <0.03 <0.03   BNP (last 3 results) No results for input(s): PROBNP in the last 8760 hours. CBG: Recent Labs  Lab 07/26/17 1949 07/26/17 2042  GLUCAP 103* 28   Studies: Ct Angio Head W Or Wo Contrast  Result Date: 07/28/2017 CLINICAL DATA:  Stroke follow-up. EXAM: CT ANGIOGRAPHY HEAD AND NECK TECHNIQUE: Multidetector CT imaging of the head and neck was performed using the standard protocol during bolus administration of intravenous contrast. Multiplanar CT image reconstructions and MIPs were obtained to evaluate the vascular anatomy. Carotid stenosis measurements (when applicable) are obtained utilizing NASCET criteria, using the distal internal carotid diameter as the denominator. CONTRAST:  53mL ISOVUE-370 IOPAMIDOL (ISOVUE-370) INJECTION 76% COMPARISON:  Head CT from 2 days prior.  Brain MRI from yesterday. FINDINGS: CT HEAD FINDINGS Brain: Known acute small vessel infarct along the left corona radiata and caudate body. No evidence of progression or hemorrhagic conversion. Chronic small vessel ischemia and atrophy with ventriculomegaly. Vascular: See below Skull: No acute or aggressive finding Sinuses: Moderate chronic sinusitis. Orbits: Bilateral cataract resection.  Review of the MIP images confirms the above findings CTA NECK FINDINGS Aortic arch: Atherosclerotic calcification. Aortic tortuosity without dilatation or stenosis. Right carotid system: No stenosis, ulceration, or dissection. No notable atheromatous changes. Left carotid system: Minimal atheromatous plaque at the ICA bulb. No stenosis or ulceration. Vertebral arteries: No proximal subclavian stenosis. Both vertebral arteries are smooth and widely patent to the dura. Skeleton: Spondylosis and degenerative facet spurring. 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 Upper chest: No acute or aggressive finding. Review of the MIP images confirms the above findings CTA HEAD FINDINGS Anterior circulation: Mild atherosclerotic plaque on the carotid siphons. Vessels are smooth and widely patent. Negative for aneurysm. Posterior circulation: Atherosclerotic calcification on the V4 segments. No stenosis or branch occlusion. Negative for aneurysm. Mild vertebrobasilar tortuosity. Venous sinuses: As permitted by contrast timing, patent. Anatomic variants: None unusual. Delayed phase: No abnormal intracranial enhancement. Review of the MIP images confirms the above findings IMPRESSION: No branch occlusion, stenosis, or visible embolic source. Atherosclerosis  is mild for age. Electronically Signed   By: Monte Fantasia M.D.   On: 07/28/2017 09:33   Ct Angio Neck W Or Wo Contrast  Result Date: 07/28/2017 CLINICAL DATA:  Stroke follow-up. EXAM: CT ANGIOGRAPHY HEAD AND NECK TECHNIQUE: Multidetector CT imaging of the head and neck was performed using the standard protocol during bolus administration of intravenous contrast. Multiplanar CT image reconstructions and MIPs were obtained to evaluate the vascular anatomy. Carotid stenosis measurements (when applicable) are obtained utilizing NASCET criteria, using the distal internal carotid diameter as the denominator. CONTRAST:  62mL  ISOVUE-370 IOPAMIDOL (ISOVUE-370) INJECTION 76% COMPARISON:  Head CT from 2 days prior.  Brain MRI from yesterday. FINDINGS: CT HEAD FINDINGS Brain: Known acute small vessel infarct along the left corona radiata and caudate body. No evidence of progression or hemorrhagic conversion. Chronic small vessel ischemia and atrophy with ventriculomegaly. Vascular: See below Skull: No acute or aggressive finding Sinuses: Moderate chronic sinusitis. Orbits: Bilateral cataract resection. Review of the MIP images confirms the above findings CTA NECK FINDINGS Aortic arch: Atherosclerotic calcification. Aortic tortuosity without dilatation or stenosis. Right carotid system: No stenosis, ulceration, or dissection. No notable atheromatous changes. Left carotid system: Minimal atheromatous plaque at the ICA bulb. No stenosis or ulceration. Vertebral arteries: No proximal subclavian stenosis. Both vertebral arteries are smooth and widely patent to the dura. Skeleton: Spondylosis and degenerative facet spurring. 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 Upper chest: No acute or aggressive finding. Review of the MIP images confirms the above findings CTA HEAD FINDINGS Anterior circulation: Mild atherosclerotic plaque on the carotid siphons. Vessels are smooth and widely patent. Negative for aneurysm. Posterior circulation: Atherosclerotic calcification on the V4 segments. No stenosis or branch occlusion. Negative for aneurysm. Mild vertebrobasilar tortuosity. Venous sinuses: As permitted by contrast timing, patent. Anatomic variants: None unusual. Delayed phase: No abnormal intracranial enhancement. Review of the MIP images confirms the above findings IMPRESSION: No branch occlusion, stenosis, or visible embolic source. Atherosclerosis is mild for age. Electronically Signed   By: Monte Fantasia M.D.   On: 07/28/2017 09:33    Scheduled Meds: . aspirin EC  325 mg Oral Daily  . atorvastatin   40 mg Oral q1800  . buPROPion  150 mg Oral BID  . carvedilol  6.25 mg Oral BID WC  . enoxaparin (LOVENOX) injection  40 mg Subcutaneous Q24H  . finasteride  5 mg Oral Daily  . fluorometholone  1 drop Both Eyes TID  . losartan  25 mg Oral Daily  . magnesium oxide  400 mg Oral BID  . pantoprazole  40 mg Oral Daily  . timolol  1 drop Both Eyes Daily  . traZODone  100 mg Oral QHS   Continuous Infusions: PRN Meds: acetaminophen **OR** acetaminophen (TYLENOL) oral liquid 160 mg/5 mL **OR** acetaminophen, ALPRAZolam  Time spent: 35 minutes  Author: Berle Mull, MD [facility] Pager: [phone] 07/28/2017 4:07 PM  If 7PM-7AM, please contact night-coverage at [email or web address], password [facility]

## 2017-07-28 NOTE — Progress Notes (Addendum)
STROKE TEAM PROGRESS NOTE   SUBJECTIVE (INTERVAL HISTORY) No family at the bedside.  Patient is found sitting in bedside chair in NAD.  Overall he feels his condition is gradually improving.  Voices no new complaints. No new events reported overnight.  Patient describes the episode of ataxia and dysarthria that occurred yesterday at home. States he fell onto the ground after mowing the lawn. No injury sustained. Nothing like this has happened before.  He has been aware for the past year of decreased energy and some mild cognitive, poor short term memory.   ECHO report reviewed with patient. States he has never seen a Cardiologist in the past.  OBJECTIVE Recent Labs  Lab 07/26/17 1949 07/26/17 2042  GLUCAP 103* 89   Recent Labs  Lab 07/26/17 1941 07/26/17 1947 07/27/17 0519 07/28/17 0337  NA  --  140 140 137  K  --  3.6 3.2* 3.5  CL  --  104 106 103  CO2  --  29 26 28   GLUCOSE  --  98 135* 115*  BUN  --  20 19 18   CREATININE  --  1.05 0.95 1.01  CALCIUM  --  9.6 8.8* 9.2  MG 1.7  --   --  1.6*   Recent Labs  Lab 07/26/17 1947 07/28/17 0337  AST 28 30  ALT 32 31  ALKPHOS 83 78  BILITOT 0.6 1.0  PROT 7.1 6.5  ALBUMIN 3.9 3.4*   Recent Labs  Lab 07/26/17 1947 07/27/17 0519 07/28/17 0337  WBC 7.4 5.7 8.8  NEUTROABS 4.6 2.9 5.6  HGB 14.9 13.5 14.0  HCT 41.9 38.7* 40.0  MCV 88.6 89.0 87.9  PLT 187 170 147*   Recent Labs  Lab 07/27/17 0016 07/27/17 0519 07/27/17 1254  TROPONINI <0.03 <0.03 <0.03   Recent Labs    07/26/17 1947  LABPROT 13.4  INR 1.03   Recent Labs    07/27/17 0052  COLORURINE YELLOW  LABSPEC 1.019  PHURINE 6.0  GLUCOSEU NEGATIVE  HGBUR NEGATIVE  BILIRUBINUR NEGATIVE  KETONESUR 5*  PROTEINUR NEGATIVE  NITRITE NEGATIVE  LEUKOCYTESUR NEGATIVE       Component Value Date/Time   CHOL 162 07/27/2017 0519   TRIG 95 07/27/2017 0519   HDL 41 07/27/2017 0519   CHOLHDL 4.0 07/27/2017 0519   VLDL 19 07/27/2017 0519   LDLCALC 102  (H) 07/27/2017 0519   Lab Results  Component Value Date   HGBA1C 5.3 07/27/2017   No results found for: LABOPIA, COCAINSCRNUR, LABBENZ, AMPHETMU, THCU, LABBARB  No results for input(s): ETH in the last 168 hours.  IMAGING: I have personally reviewed the radiological images below and agree with the radiology interpretations.  Ct Angio Head & Neck W Or Wo Contrast Result Date: 07/28/2017 IMPRESSION: No branch occlusion, stenosis, or visible embolic source. Atherosclerosis is mild for age. Electronically Signed   By: Monte Fantasia M.D.   On: 07/28/2017 09:33   Ct Head Wo Contrast Result Date: 07/26/2017 IMPRESSION: No acute intracranial abnormalities. Chronic atrophy and small vessel ischemic changes. Electronically Signed   By: Lucienne Capers M.D.   On: 07/26/2017 21:55   Mr Brain Wo Contrast Result Date: 07/27/2017 IMPRESSION: 1. Small acute infarct of the left caudate body. No hemorrhage or mass effect. 2. Chronic ischemic microangiopathy and generalized volume loss. Electronically Signed   By: Ulyses Jarred M.D.   On: 07/27/2017 03:21   ECHO 07/27/17 Study Conclusions - Left ventricle: The cavity size was normal. Wall thickness was  normal. Systolic function was moderately reduced. The estimated   ejection fraction was in the range of 35% to 40%. Mild diffuse   hypokinesis with no identifiable regional variations. Features   are consistent with a pseudonormal left ventricular filling   pattern, with concomitant abnormal relaxation and increased   filling pressure (grade 2 diastolic dysfunction). - Mitral valve: There was mild regurgitation. - Left atrium: The atrium was mildly dilated. - Right atrium: The atrium was mildly dilated. - Pulmonary arteries: Systolic pressure was mildly increased. PA   peak pressure: 34 mm Hg (S).   PHYSICAL EXAM Temp:  [97.8 F (36.6 C)-99.3 F (37.4 C)] 99 F (37.2 C) (11/09 1027) Pulse Rate:  [55-89] 55 (11/09 1027) Resp:  [18] 18  (11/09 0400) BP: (137-163)/(51-70) 137/53 (11/09 1027) SpO2:  [93 %-98 %] 97 % (11/09 1027)  General - Well nourished, well developed, in no apparent distress Respiratory - Lungs clear bilaterally. No wheezing. Cardiovascular - Iregular rate and rhythm   Neurological Examination Mental Status: Alert, oriented, thought content appropriate.  Speech fluent without evidence of aphasia.  Some very mild word finding difficulties. Able to follow 3 step commands without difficulty. Cranial Nerves: II: Discs flat bilaterally; Visual fields grossly normal,  III,IV, VI: ptosis not present, extra-ocular motions intact bilaterally, pupils equal, round, reactive to light and accommodation V,VII: smile symmetric, facial light touch sensation normal bilaterally VIII: hearing normal bilaterally IX,X: uvula rises symmetrically, tremor-like movement noted in chin, patient has habit of clicking teeth continuously. When asked to stop the tremor like movement stopped. XI: bilateral shoulder shrug XII: midline tongue extension Motor: Right :  Upper extremity   5/5                                      Left:     Upper extremity   5/5             Lower extremity   5/5                                                  Lower extremity   5/5 Tone and bulk:normal tone throughout; no atrophy noted Sensory: Pinprick and light touch intact throughout, bilaterally Deep Tendon Reflexes: 3-4+ and symmetric throughout Plantars: Right: downgoing                                Left: downgoing Cerebellar: normal finger-to-nose, and normal heel-to-shin test Gait: Not tested   ASSESSMENT AND PLAN: Jared [name] is a 76 y.o. male with PMH of HTN, OSA, BPH admitted for episode of dizziness and fall at home on 07/26/17.  Stroke: Acute Left caudate body Infarct. location consistent with small vessel disease but also possible cardioembolic source with low EF 35-40% and frequent PACs and PVCs  Resultant Symptoms - All  admission symptoms have resolved  MRI Head - Small acute infarct of the left caudate body.  CTA Head/Neck - No branch occlusion, stenosis, or visible embolic source  CT Head -No acute intracranial abnormalities  2D Echo - EF 35 - 40%, Grade 2 DD  Will need TEE and Loop Recorder vs. 30 day cardiac event monitoring  LDL -  102  HgbA1c 5.3  VTE prophylaxis - Lovenox   Diet - Diet Heart Room service appropriate? Yes; Fluid consistency: Thin   No antithrombotic prior to admission, now on aspirin 325 mg daily.   Please discharge patient on  aspirin 325 mg daily  Patient counseled to be compliant with her antithrombotic medications  Ongoing aggressive stroke risk factor management  Therapy recommendations:  HH  Disposition: HOME  Follow up with [facility] Dr. Erlinda Hong in 6 weeks  Cardiomyopathy with EF 35-40%:  Cardiology consulted  Will do lexiscan for ischemic work up  Agree with TEE and loop  Put on coreg and losartan  Frequent PVCs and PACs  Cardiology on board  Put on coreg   Will have TEE and loop as per cardiology  HYPERTENSION: Stable Permissive hypertension (OK if <220/120) for 24-48 hours post stroke and then gradually normalized within 5-7 days.  Long term BP goal normotensive.   May slowly restart home B/P medications after 48 hours with close PCP follow up.  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 Patient states it Dx when he was 76 years old.  Hypomagnesemia - replacement per primary team  Hospital day # 1  Renie Ora Stroke Neurology Team 07/28/2017 1:12 PM  I reviewed above note and agree with the assessment and plan. I have made any additions or clarifications directly to  the above note. Pt was seen and examined. Symptoms resolved but stated that he has been lack of energy for several months. Found to have cardiomyopathy with EF 35-40% with frequent PACs and PVCs. Cardiology on board, will do ischemic work up. Agree with TEE and loop. Although pt stroke location consistent with small vessel disease but need to rule out cardioembolic source given low EF and arrhythmia. On coreg and losartan. Will continue ASA and liptor this time.   Rosalin Hawking, MD PhD Stroke Neurology 07/28/2017 10:09 PM     To contact Stroke Continuity provider, please refer to [email or web address] After hours, contact General Neurology

## 2017-07-29 ENCOUNTER — Inpatient Hospital Stay (HOSPITAL_COMMUNITY): Payer: PPO

## 2017-07-29 DIAGNOSIS — I429 Cardiomyopathy, unspecified: Secondary | ICD-10-CM

## 2017-07-29 LAB — NM MYOCAR MULTI W/SPECT W/WALL MOTION / EF
CHL CUP RESTING HR STRESS: 67 {beats}/min
CSEPEDS: 0 s
CSEPHR: 67 %
Estimated workload: 1 METS
Exercise duration (min): 0 min
MPHR: 146 {beats}/min
Peak HR: 99 {beats}/min

## 2017-07-29 LAB — BASIC METABOLIC PANEL
Anion gap: 5 (ref 5–15)
BUN: 20 mg/dL (ref 6–20)
CO2: 28 mmol/L (ref 22–32)
Calcium: 9 mg/dL (ref 8.9–10.3)
Chloride: 106 mmol/L (ref 101–111)
Creatinine, Ser: 1.01 mg/dL (ref 0.61–1.24)
GFR calc Af Amer: >60 mL/min (ref >60)
GFR calc non Af Amer: >60 mL/min (ref >60)
Glucose, Bld: 90 mg/dL (ref 65–99)
Potassium: 3.9 mmol/L (ref 3.5–5.1)
Sodium: 139 mmol/L (ref 135–145)

## 2017-07-29 LAB — MAGNESIUM: MAGNESIUM: 1.9 mg/dL (ref 1.7–2.4)

## 2017-07-29 MED ORDER — REGADENOSON 0.4 MG/5ML IV SOLN
0.4000 mg | Freq: Once | INTRAVENOUS | Status: AC
Start: 2017-07-29 — End: 2017-07-29
  Administered 2017-07-29: 0.4 mg via INTRAVENOUS
  Filled 2017-07-29: qty 5

## 2017-07-29 MED ORDER — TECHNETIUM TC 99M TETROFOSMIN IV KIT
10.0000 | PACK | Freq: Once | INTRAVENOUS | Status: AC | PRN
  Administered 2017-07-29: 10 via INTRAVENOUS

## 2017-07-29 MED ORDER — LOSARTAN POTASSIUM 50 MG PO TABS
50.0000 mg | ORAL_TABLET | Freq: Every day | ORAL | Status: DC
  Administered 2017-07-29 – 2017-07-31 (×3): 50 mg via ORAL
  Filled 2017-07-29 (×3): qty 1

## 2017-07-29 MED ORDER — REGADENOSON 0.4 MG/5ML IV SOLN
INTRAVENOUS | Status: AC
  Filled 2017-07-29: qty 5

## 2017-07-29 MED ORDER — TECHNETIUM TC 99M TETROFOSMIN IV KIT
30.0000 | PACK | Freq: Once | INTRAVENOUS | Status: AC | PRN
  Administered 2017-07-29: 30 via INTRAVENOUS

## 2017-07-29 NOTE — Progress Notes (Signed)
Progress Note  Patient Name: Jared [name] Date of Encounter: 07/29/2017  Primary Cardiologist: B. Crenshaw, MD   Subjective   No chest pain or sob overnight.  For MV this AM.  Inpatient Medications    Scheduled Meds: . aspirin EC  325 mg Oral Daily  . atorvastatin  40 mg Oral q1800  . buPROPion  150 mg Oral BID  . carvedilol  6.25 mg Oral BID WC  . enoxaparin (LOVENOX) injection  40 mg Subcutaneous Q24H  . finasteride  5 mg Oral Daily  . fluorometholone  1 drop Both Eyes TID  . losartan  25 mg Oral Daily  . magnesium oxide  400 mg Oral BID  . pantoprazole  40 mg Oral Daily  . regadenoson      . timolol  1 drop Both Eyes Daily  . traZODone  100 mg Oral QHS   Continuous Infusions:  PRN Meds: acetaminophen **OR** acetaminophen (TYLENOL) oral liquid 160 mg/5 mL **OR** acetaminophen, ALPRAZolam   Vital Signs    Vitals:   07/28/17 2100 07/29/17 0100 07/29/17 0500 07/29/17 0900  BP: (!) 150/59 (!) 129/57 (!) 152/71 (!) 167/104  Pulse: 65 (!) 105 70   Resp: 18 18 18    Temp: 99.1 F (37.3 C) 98.8 F (37.1 C) 99.1 F (37.3 C)   TempSrc: Oral Oral Oral   SpO2: 95% 93% 94%   Weight:      Height:        Intake/Output Summary (Last 24 hours) at 07/29/2017 0914 Last data filed at 07/28/2017 1700 Gross per 24 hour  Intake 680 ml  Output -  Net 680 ml   Filed Weights   07/27/17 0530  Weight: 186 lb 9.6 oz (84.6 kg)    Physical Exam   GEN: Well nourished, well developed, in no acute distress.  HEENT: Grossly normal.  Neck: Supple, no JVD, carotid bruits.  Ant neck mass. Cardiac: Irreg, freq ectopy, no murmurs, rubs, or gallops. No clubbing, cyanosis, edema.  Radials/DP/PT 2+ and equal bilaterally.  Respiratory:  Respirations regular and unlabored, clear to auscultation bilaterally. GI: Soft, nontender, nondistended, BS + x 4. MS: no deformity or atrophy. Skin: warm and dry, no rash. Neuro:  Strength and sensation are intact. Psych: AAOx3.  Normal  affect.  Labs    Chemistry Recent Labs  Lab 07/26/17 1947 07/27/17 0519 07/28/17 0337 07/29/17 0442  NA 140 140 137 139  K 3.6 3.2* 3.5 3.9  CL 104 106 103 106  CO2 29 26 28 28   GLUCOSE 98 135* 115* 90  BUN 20 19 18 20   CREATININE 1.05 0.95 1.01 1.01  CALCIUM 9.6 8.8* 9.2 9.0  PROT 7.1  --  6.5  --   ALBUMIN 3.9  --  3.4*  --   AST 28  --  30  --   ALT 32  --  31  --   ALKPHOS 83  --  78  --   BILITOT 0.6  --  1.0  --   GFRNONAA >60 >60 >60 >60  GFRAA >60 >60 >60 >60  ANIONGAP 7 8 6 5      Hematology Recent Labs  Lab 07/26/17 1947 07/27/17 0519 07/28/17 0337  WBC 7.4 5.7 8.8  RBC 4.73 4.35 4.55  HGB 14.9 13.5 14.0  HCT 41.9 38.7* 40.0  MCV 88.6 89.0 87.9  MCH 31.5 31.0 30.8  MCHC 35.6 34.9 35.0  RDW 12.3 12.5 12.1  PLT 187 170 147*    Cardiac  Enzymes Recent Labs  Lab 07/27/17 0016 07/27/17 0519 07/27/17 1254  TROPONINI <0.03 <0.03 <0.03    Recent Labs  Lab 07/26/17 1953  TROPIPOC 0.00     BNP Recent Labs  Lab 07/28/17 1603  BNP 282.7*     Radiology    Ct Angio Head W Or Wo Contrast  Result Date: 07/28/2017 CLINICAL DATA:  Stroke follow-up. EXAM: CT ANGIOGRAPHY HEAD AND NECK TECHNIQUE: Multidetector CT imaging of the head and neck was performed using the standard protocol during bolus administration of intravenous contrast. Multiplanar CT image reconstructions and MIPs were obtained to evaluate the vascular anatomy. Carotid stenosis measurements (when applicable) are obtained utilizing NASCET criteria, using the distal internal carotid diameter as the denominator. CONTRAST:  25mL ISOVUE-370 IOPAMIDOL (ISOVUE-370) INJECTION 76% COMPARISON:  Head CT from 2 days prior.  Brain MRI from yesterday. FINDINGS: CT HEAD FINDINGS Brain: Known acute small vessel infarct along the left corona radiata and caudate body. No evidence of progression or hemorrhagic conversion. Chronic small vessel ischemia and atrophy with ventriculomegaly. Vascular: See below  Skull: No acute or aggressive finding Sinuses: Moderate chronic sinusitis. Orbits: Bilateral cataract resection. Review of the MIP images confirms the above findings CTA NECK FINDINGS Aortic arch: Atherosclerotic calcification. Aortic tortuosity without dilatation or stenosis. Right carotid system: No stenosis, ulceration, or dissection. No notable atheromatous changes. Left carotid system: Minimal atheromatous plaque at the ICA bulb. No stenosis or ulceration. Vertebral arteries: No proximal subclavian stenosis. Both vertebral arteries are smooth and widely patent to the dura. Skeleton: Spondylosis and degenerative facet spurring. 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 Upper chest: No acute or aggressive finding. Review of the MIP images confirms the above findings CTA HEAD FINDINGS Anterior circulation: Mild atherosclerotic plaque on the carotid siphons. Vessels are smooth and widely patent. Negative for aneurysm. Posterior circulation: Atherosclerotic calcification on the V4 segments. No stenosis or branch occlusion. Negative for aneurysm. Mild vertebrobasilar tortuosity. Venous sinuses: As permitted by contrast timing, patent. Anatomic variants: None unusual. Delayed phase: No abnormal intracranial enhancement. Review of the MIP images confirms the above findings IMPRESSION: No branch occlusion, stenosis, or visible embolic source. Atherosclerosis is mild for age. Electronically Signed   By: Monte Fantasia M.D.   On: 07/28/2017 09:33   Ct Angio Neck W Or Wo Contrast  Result Date: 07/28/2017 CLINICAL DATA:  Stroke follow-up. EXAM: CT ANGIOGRAPHY HEAD AND NECK TECHNIQUE: Multidetector CT imaging of the head and neck was performed using the standard protocol during bolus administration of intravenous contrast. Multiplanar CT image reconstructions and MIPs were obtained to evaluate the vascular anatomy. Carotid stenosis measurements (when applicable) are obtained  utilizing NASCET criteria, using the distal internal carotid diameter as the denominator. CONTRAST:  7mL ISOVUE-370 IOPAMIDOL (ISOVUE-370) INJECTION 76% COMPARISON:  Head CT from 2 days prior.  Brain MRI from yesterday. FINDINGS: CT HEAD FINDINGS Brain: Known acute small vessel infarct along the left corona radiata and caudate body. No evidence of progression or hemorrhagic conversion. Chronic small vessel ischemia and atrophy with ventriculomegaly. Vascular: See below Skull: No acute or aggressive finding Sinuses: Moderate chronic sinusitis. Orbits: Bilateral cataract resection. Review of the MIP images confirms the above findings CTA NECK FINDINGS Aortic arch: Atherosclerotic calcification. Aortic tortuosity without dilatation or stenosis. Right carotid system: No stenosis, ulceration, or dissection. No notable atheromatous changes. Left carotid system: Minimal atheromatous plaque at the ICA bulb. No stenosis or ulceration. Vertebral arteries: No proximal subclavian stenosis. Both vertebral arteries  are smooth and widely patent to the dura. Skeleton: Spondylosis and degenerative facet spurring. 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 Upper chest: No acute or aggressive finding. Review of the MIP images confirms the above findings CTA HEAD FINDINGS Anterior circulation: Mild atherosclerotic plaque on the carotid siphons. Vessels are smooth and widely patent. Negative for aneurysm. Posterior circulation: Atherosclerotic calcification on the V4 segments. No stenosis or branch occlusion. Negative for aneurysm. Mild vertebrobasilar tortuosity. Venous sinuses: As permitted by contrast timing, patent. Anatomic variants: None unusual. Delayed phase: No abnormal intracranial enhancement. Review of the MIP images confirms the above findings IMPRESSION: No branch occlusion, stenosis, or visible embolic source. Atherosclerosis is mild for age. Electronically Signed   By: Monte Fantasia M.D.   On: 07/28/2017 09:33    Telemetry    Seen in nuc med - sinus freq pvc's - Personally Reviewed  Cardiac Studies   2D Echocardiogram 11.7.2018  Echocardiogram 07/26/17: Study Conclusions - Left ventricle: The cavity size was normal. Wall thickness was   normal. Systolic function was moderately reduced. The estimated   ejection fraction was in the range of 35% to 40%. Mild diffuse   hypokinesis with no identifiable regional variations. Features   are consistent with a pseudonormal left ventricular filling   pattern, with concomitant abnormal relaxation and increased   filling pressure (grade 2 diastolic dysfunction). - Mitral valve: There was mild regurgitation. - Left atrium: The atrium was mildly dilated. - Right atrium: The atrium was mildly dilated. - Pulmonary arteries: Systolic pressure was mildly increased. PA   peak pressure: 34 mm Hg (S).   Patient Profile     Jared [name] is a 76 y.o. male with a hx of HTN, frequent PVCs, anxiety, GERD, and OSA who is being seen today for the evaluation of new onset systolic and diastolic heart failure at the request of Dr. Posey Pronto.   Assessment & Plan    1.  Cardiomyopathy, new onset systolic and diastolic CHF:  EF 24-46%. Euvolemic on exam.  Cont  blocker and ARB - titrate ARB with ongoing hypertension.  MV today.  2.  Chest pain:  Trop neg.  MV today.  3.  Frequent PVC's: cont  blocker.  ? Role in cardiomyopathy.  4.  HTN:  Titrate ARB.  5.  CVA:  Plan TEE for Monday. If neg  loop.  Signed, Murray Hodgkins, NP  07/29/2017, 9:14 AM    For questions or updates, please contact   Please consult [email or web address] for contact info under Cardiology/STEMI.  History and all data above reviewed.  Patient examined.  I agree with the findings as above.  No chest pain.  No SOB.   The patient exam reveals COR:RRR  ,  Lungs: Clear  ,  Abd: Positive bowel sounds, no rebound no guarding, Ext No edema  .  All available labs,  radiology testing, previous records reviewed. Agree with documented assessment and plan. CM:  Etiology is not clear.  Stress test results pending.  CVA:  Plan TEE on Monday.  Discussed with the patient.    Jared [name]  1:47 PM  07/29/2017\

## 2017-07-29 NOTE — Progress Notes (Signed)
[facility] Progress Note  Patient: Jared [name] MWN:[id number]   PCP: Marletta Lor, MD DOB: [birth year]   DOA: 07/26/2017   DOS: 07/29/2017   Date of Service: the patient was seen and examined on 07/29/2017  Subjective: No acute complaints.  Complains about having difficulty with speech.  No nausea no vomiting.  Brief hospital course: Pt. with PMH of HTN, anxiety, BPH; admitted on 07/26/2017, presented with complaint of dizziness, was found to have acute CVA as well as acute cardiomyopathy. Currently further plan is further workup for stroke as well as cardiomyopathy.  Assessment and Plan: 1.  Acute left caudate body CVA. Likely small vessel disease but possibility of embolus cannot be ruled out. Echocardiogram shows 35-40% EF. LDL 102 on Lipitor 40 mg. On aspirin 325 mg as well. PT OT recommends home with home health. CTA head and neck shows no acute stenosis or obstruction. Symptoms are improving right now. Hemoglobin A1c 5.3. Speech therapy recommends no further therapy requirements as well. Pt will require a TEE to rule out any possibility of embolus as well as may require loop recorder which will all be arranged on Monday. Monitor over the weekend on telemetry.  2.  Acute cardiomyopathy, systolic dysfunction without any CHF. Echocardiogram shows EF of 40-34%, grade 2 diastolic dysfunction. No specific mention about wall motion abnormality. Patient did not have any chest pain or any ACS symptoms in the past. Cardiology consulted, recommend inpatient stress test. Patient will be started on Coreg as well as losartan. Patient does have bradycardia as well as symptomatic PVCs continue to monitor on telemetry overnight.  3.  Essential hypertension. Patient will be started on Coreg and losartan per cardiology. Monitor blood pressure.  4.  Chronic neck lesion. Simple appearing lipoma based on the CT scan. No further workup recommended at present.  5.  Hypokalemia and  hypomagnesemia. Replacing now.  Diet: cadiac diet DVT Prophylaxis: subcutaneous Heparin  Advance goals of care discussion: full code  Family Communication: no family was present at bedside, at the time of interview.  Disposition:  Discharge to home with home health.  Consultants: cardiology neurology  Procedures: Echocardiogram   Antibiotics: Anti-infectives (From admission, onward)   None       Objective: Physical Exam: Vitals:   07/29/17 0900 07/29/17 0913 07/29/17 0915 07/29/17 0917  BP: (!) 167/104 (!) 151/117 (!) 146/118 (!) 146/114  Pulse:      Resp:      Temp:      TempSrc:      SpO2:      Weight:      Height:        Intake/Output Summary (Last 24 hours) at 07/29/2017 1135 Last data filed at 07/28/2017 1700 Gross per 24 hour  Intake 680 ml  Output -  Net 680 ml   Filed Weights   07/27/17 0530  Weight: 84.6 kg (186 lb 9.6 oz)   General: Alert, Awake and Oriented to Time, Place and Person. Appear in mild distress, affect appropriate Eyes: PERRL, Conjunctiva normal ENT: Oral Mucosa clear moist. Neck: no JVD, no Abnormal Mass Or lumps Cardiovascular: S1 and S2 Present, no Murmur, Peripheral Pulses Present Respiratory: normal respiratory effort, Bilateral Air entry equal and Decreased, no use of accessory muscle, Clear to Auscultation, no Crackles, no wheezes Abdomen: Bowel Sound present, Soft and no tenderness, no hernia Skin: no redness, no Rash, no induration Extremities: no Pedal edema, no calf tenderness Neurologic: Grossly no focal neuro deficit. Bilaterally Equal motor strength  Data Reviewed: CBC: Recent Labs  Lab 07/26/17 1947 07/27/17 0519 07/28/17 0337  WBC 7.4 5.7 8.8  NEUTROABS 4.6 2.9 5.6  HGB 14.9 13.5 14.0  HCT 41.9 38.7* 40.0  MCV 88.6 89.0 87.9  PLT 187 170 950*   Basic Metabolic Panel: Recent Labs  Lab 07/26/17 1941 07/26/17 1947 07/27/17 0519 07/28/17 0337 07/29/17 0442  NA  --  140 140 137 139  K  --  3.6 3.2* 3.5  3.9  CL  --  104 106 103 106  CO2  --  29 26 28 28   GLUCOSE  --  98 135* 115* 90  BUN  --  20 19 18 20   CREATININE  --  1.05 0.95 1.01 1.01  CALCIUM  --  9.6 8.8* 9.2 9.0  MG 1.7  --   --  1.6* 1.9    Liver Function Tests: Recent Labs  Lab 07/26/17 1947 07/28/17 0337  AST 28 30  ALT 32 31  ALKPHOS 83 78  BILITOT 0.6 1.0  PROT 7.1 6.5  ALBUMIN 3.9 3.4*   No results for input(s): LIPASE, AMYLASE in the last 168 hours. No results for input(s): AMMONIA in the last 168 hours. Coagulation Profile: Recent Labs  Lab 07/26/17 1947  INR 1.03   Cardiac Enzymes: Recent Labs  Lab 07/27/17 0016 07/27/17 0519 07/27/17 1254  TROPONINI <0.03 <0.03 <0.03   BNP (last 3 results) No results for input(s): PROBNP in the last 8760 hours. CBG: Recent Labs  Lab 07/26/17 1949 07/26/17 2042  GLUCAP 103* 89   Studies: No results found.  Scheduled Meds: . aspirin EC  325 mg Oral Daily  . atorvastatin  40 mg Oral q1800  . buPROPion  150 mg Oral BID  . carvedilol  6.25 mg Oral BID WC  . enoxaparin (LOVENOX) injection  40 mg Subcutaneous Q24H  . finasteride  5 mg Oral Daily  . fluorometholone  1 drop Both Eyes TID  . losartan  50 mg Oral Daily  . magnesium oxide  400 mg Oral BID  . pantoprazole  40 mg Oral Daily  . regadenoson      . timolol  1 drop Both Eyes Daily  . traZODone  100 mg Oral QHS   Continuous Infusions: PRN Meds: acetaminophen **OR** acetaminophen (TYLENOL) oral liquid 160 mg/5 mL **OR** acetaminophen, ALPRAZolam  Time spent: 35 minutes  Author: Berle Mull, MD [facility] Pager: [phone] 07/29/2017 11:35 AM  If 7PM-7AM, please contact night-coverage at [email or web address], password [facility]

## 2017-07-29 NOTE — Progress Notes (Signed)
Physical Therapy Treatment Patient Details Name: BRANDEN SHALLENBERGER MRN: [id number] DOB: [birth year] Today's Date: 07/29/2017    History of Present Illness Pt is a 76 y.o. male presented with c/o dizziness s/p mechanical fall while mowing the lawn. Pt reports that he has had dizzy episodes periodically over last 3 weeks. 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    PT Comments    Pt is progressing well towards goals and showing improved gait pattern with visual and verbal cues. Pt would continue to benefit from continued skilled PT after d/c to address frequent falls and balance. Will continue to follow acutely.   Follow Up Recommendations  Home health PT;Supervision - Intermittent     Equipment Recommendations  Rolling walker with 5" wheels    Recommendations for Other Services OT consult     Precautions / Restrictions Precautions Precautions: Fall Precaution Comments: fallen out of tub x3 in last 3 months, fallen in yard x2  Restrictions Weight Bearing Restrictions: No    Mobility  Bed Mobility Overal bed mobility: Modified Independent             General bed mobility comments: OOB in recliner  Transfers Overall transfer level: Needs assistance Equipment used: None Transfers: Sit to/from Stand Sit to Stand: Modified independent (Device/Increase time)            Ambulation/Gait Ambulation/Gait assistance: Min guard;Supervision Ambulation Distance (Feet): 300 Feet Assistive device: None Gait Pattern/deviations: Step-through pattern;Narrow base of support Gait velocity: WFL   General Gait Details: min guard progressing to supervision. Focused on widening BOS using tiles on floor as visual cues. Had pt scan enviroment form 100 ft with no LOB or report of dizziness.   Stairs            Wheelchair  Mobility    Modified Rankin (Stroke Patients Only) Modified Rankin (Stroke Patients Only) Pre-Morbid Rankin Score: No symptoms Modified Rankin: No significant disability     Balance Overall balance assessment: Needs assistance Sitting-balance support: Feet supported;Feet unsupported;No upper extremity supported Sitting balance-Leahy Scale: Good     Standing balance support: No upper extremity supported;During functional activity Standing balance-Leahy Scale: Good Standing balance comment: does not require assist but would not withstand maximal challange to balance                            Cognition Arousal/Alertness: Awake/alert Behavior During Therapy: WFL for tasks assessed/performed Overall Cognitive Status: Within Functional Limits for tasks assessed                                        Exercises      General Comments        Pertinent Vitals/Pain Pain Assessment: No/denies pain    Home Living                      Prior Function            PT Goals (current goals can now be found in the care plan section) Acute Rehab PT Goals Patient Stated Goal: go home PT Goal Formulation: With patient/family Time For Goal Achievement: 08/10/17 Potential to Achieve Goals: Good Progress towards PT goals: Progressing toward goals  Frequency    Min 4X/week      PT Plan Current plan remains appropriate    Co-evaluation              AM-PAC PT "6 Clicks" Daily Activity  Outcome Measure  Difficulty turning over in bed (including adjusting bedclothes, sheets and blankets)?: A Little Difficulty moving from lying on back to sitting on the side of the bed? : A Little Difficulty sitting down on and standing up from a chair with arms (e.g., wheelchair, bedside commode, etc,.)?: None Help needed moving to and from a bed to chair (including a wheelchair)?: None Help needed walking in hospital room?: None Help needed climbing  3-5 steps with a railing? : None 6 Click Score: 22    End of Session Equipment Utilized During Treatment: Gait belt Activity Tolerance: Patient tolerated treatment well Patient left: in chair;with call bell/phone within reach Nurse Communication: Mobility status PT Visit Diagnosis: Unsteadiness on feet (R26.81);Other abnormalities of gait and mobility (R26.89);Repeated falls (R29.6);Muscle weakness (generalized) (M62.81);History of falling (Z91.81);Ataxic gait (R26.0);Difficulty in walking, not elsewhere classified (R26.2);Other symptoms and signs involving the nervous system (R29.898)     Time: 2122-4825 PT Time Calculation (min) (ACUTE ONLY): 21 min  Charges:  $Gait Training: 8-22 mins                    G Codes:      Benjiman Core, [place] Pager [phone] Acute Rehab   Allena Katz 07/29/2017, 11:43 AM

## 2017-07-29 NOTE — Progress Notes (Signed)
STROKE TEAM PROGRESS NOTE   SUBJECTIVE (INTERVAL HISTORY) No family at the bedside.  Patient is found walking the hallway with the physical therapist overall he feels his condition is gradually improving.  Voices no new complaints. No new events reported overnight.     OBJECTIVE Recent Labs  Lab 07/26/17 1949 07/26/17 2042  GLUCAP 103* 89   Recent Labs  Lab 07/26/17 1941  07/26/17 1947 07/27/17 0519 07/28/17 0337 07/29/17 0442  NA  --   --  140 140 137 139  K  --   --  3.6 3.2* 3.5 3.9  CL  --   --  104 106 103 106  CO2  --   --  29 26 28 28   GLUCOSE  --   --  98 135* 115* 90  BUN  --   --  20 19 18 20   CREATININE  --   --  1.05 0.95 1.01 1.01  CALCIUM  --    < > 9.6 8.8* 9.2 9.0  MG 1.7  --   --   --  1.6* 1.9   < > = values in this interval not displayed.   Recent Labs  Lab 07/26/17 1947 07/28/17 0337  AST 28 30  ALT 32 31  ALKPHOS 83 78  BILITOT 0.6 1.0  PROT 7.1 6.5  ALBUMIN 3.9 3.4*   Recent Labs  Lab 07/26/17 1947 07/27/17 0519 07/28/17 0337  WBC 7.4 5.7 8.8  NEUTROABS 4.6 2.9 5.6  HGB 14.9 13.5 14.0  HCT 41.9 38.7* 40.0  MCV 88.6 89.0 87.9  PLT 187 170 147*   Recent Labs  Lab 07/27/17 0016 07/27/17 0519 07/27/17 1254  TROPONINI <0.03 <0.03 <0.03   Recent Labs    07/26/17 1947  LABPROT 13.4  INR 1.03   Recent Labs    07/27/17 0052  COLORURINE YELLOW  LABSPEC 1.019  PHURINE 6.0  GLUCOSEU NEGATIVE  HGBUR NEGATIVE  BILIRUBINUR NEGATIVE  KETONESUR 5*  PROTEINUR NEGATIVE  NITRITE NEGATIVE  LEUKOCYTESUR NEGATIVE       Component Value Date/Time   CHOL 162 07/27/2017 0519   TRIG 95 07/27/2017 0519   HDL 41 07/27/2017 0519   CHOLHDL 4.0 07/27/2017 0519   VLDL 19 07/27/2017 0519   LDLCALC 102 (H) 07/27/2017 0519   Lab Results  Component Value Date   HGBA1C 5.3 07/27/2017   No results found for: LABOPIA, COCAINSCRNUR, LABBENZ, AMPHETMU, THCU, LABBARB  No results for input(s): ETH in the last 168 hours.  IMAGING: I have  personally reviewed the radiological images below and agree with the radiology interpretations.  Ct Angio Head & Neck W Or Wo Contrast Result Date: 07/28/2017 IMPRESSION: No branch occlusion, stenosis, or visible embolic source. Atherosclerosis is mild for age. Electronically Signed   By: Monte Fantasia M.D.   On: 07/28/2017 09:33   Ct Head Wo Contrast Result Date: 07/26/2017 IMPRESSION: No acute intracranial abnormalities. Chronic atrophy and small vessel ischemic changes. Electronically Signed   By: Lucienne Capers M.D.   On: 07/26/2017 21:55   Mr Brain Wo Contrast Result Date: 07/27/2017 IMPRESSION: 1. Small acute infarct of the left caudate body. No hemorrhage or mass effect. 2. Chronic ischemic microangiopathy and generalized volume loss. Electronically Signed   By: Ulyses Jarred M.D.   On: 07/27/2017 03:21   ECHO 07/27/17 Study Conclusions - Left ventricle: The cavity size was normal. Wall thickness was   normal. Systolic function was moderately reduced. The estimated   ejection fraction was in  the range of 35% to 40%. Mild diffuse   hypokinesis with no identifiable regional variations. Features   are consistent with a pseudonormal left ventricular filling   pattern, with concomitant abnormal relaxation and increased   filling pressure (grade 2 diastolic dysfunction). - Mitral valve: There was mild regurgitation. - Left atrium: The atrium was mildly dilated. - Right atrium: The atrium was mildly dilated. - Pulmonary arteries: Systolic pressure was mildly increased. PA   peak pressure: 34 mm Hg (S).   PHYSICAL EXAM Temp:  [97.8 F (36.6 C)-99.1 F (37.3 C)] 99.1 F (37.3 C) (11/10 0500) Pulse Rate:  [50-105] 70 (11/10 0500) Resp:  [18] 18 (11/10 0500) BP: (129-167)/(57-118) 146/114 (11/10 0917) SpO2:  [93 %-99 %] 94 % (11/10 0500)  General -pleasant elderly Caucasian male, in no apparent distress Respiratory - Lungs clear bilaterally. No wheezing. Cardiovascular -  Iregular rate and rhythm   Neurological Examination Mental Status: Alert, oriented, thought content appropriate.  Speech fluent without evidence of aphasia.  Some very mild word finding difficulties. Able to follow 3 step commands without difficulty. Cranial Nerves: II: Discs flat bilaterally; Visual fields grossly normal,  III,IV, VI: ptosis not present, extra-ocular motions intact bilaterally, pupils equal, round, reactive to light and accommodation V,VII: smile symmetric, facial light touch sensation normal bilaterally VIII: hearing normal bilaterally IX,X: uvula rises symmetrically, tremor-like movement noted in chin, patient has habit of clicking teeth continuously. When asked to stop the tremor like movement stopped. XI: bilateral shoulder shrug XII: midline tongue extension Motor: Right :  Upper extremity   5/5                                      Left:     Upper extremity   5/5             Lower extremity   5/5                                                  Lower extremity   5/5 Tone and bulk:normal tone throughout; no atrophy noted Sensory: Pinprick and light touch intact throughout, bilaterally Deep Tendon Reflexes: 3-4+ and symmetric throughout Plantars: Right: downgoing                                Left: downgoing Cerebellar: normal finger-to-nose, and normal heel-to-shin test Gait: Not tested   ASSESSMENT AND PLAN: [name] is a 76 y.o. male with PMH of HTN, OSA, BPH admitted for episode of dizziness and fall at home on 07/26/17.  Stroke: Acute Left caudate body Infarct. location consistent with small vessel disease but also possible cardioembolic source with low EF 35-40% and frequent PACs and PVCs  Resultant Symptoms - All admission symptoms have resolved  MRI Head - Small acute infarct of the left caudate body.  CTA Head/Neck - No branch occlusion, stenosis, or visible embolic source  CT Head -No acute intracranial abnormalities  2D Echo - EF 35 - 40%,  Grade 2 DD  Will need TEE and Loop Recorder    LDL -  102  HgbA1c 5.3  VTE prophylaxis - Lovenox   Diet - Diet NPO time specified Except for: [employer]  with Meds   No antithrombotic prior to admission, now on aspirin 325 mg daily.   Please discharge patient on  aspirin 325 mg daily  Patient counseled to be compliant with her antithrombotic medications  Ongoing aggressive stroke risk factor management  Therapy recommendations:  HH  Disposition: HOME  Follow up with [facility] Dr. Erlinda Hong in 6 weeks  Cardiomyopathy with EF 35-40%:  Cardiology consulted  Will do lexiscan for ischemic work up  Agree with TEE and loop  Put on coreg and losartan  Frequent PVCs and PACs  Cardiology on board  Put on coreg   Will have TEE and loop as per cardiology  HYPERTENSION: Stable Permissive hypertension (OK if <220/120) for 24-48 hours post stroke and then gradually normalized within 5-7 days.  Long term BP goal normotensive.   May slowly restart home B/P medications after 48 hours with close PCP follow up.  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 Patient states it Dx when he was 76 years old.  Hypomagnesemia - replacement per primary team  Hospital day # 2     I reviewed above note and agree with the assessment and plan. I have made any additions or clarifications directly to the above note. Pt was seen and examined. Symptoms resolved but stated that he has been lack of energy for several months. Found to have cardiomyopathy with EF 35-40% with frequent PACs and PVCs. Cardiology on board, will do ischemic work up. Agree with TEE and loop. Although pt stroke location consistent with small vessel disease  but need to rule out cardioembolic source given low EF and arrhythmia. On coreg and losartan. Will continue ASA and liptor this time. D/W Dr Posey Pronto  Antony Contras, MD Stroke Neurology 07/29/2017 1:32 PM     To contact Stroke Continuity provider, please refer to [email or web address] After hours, contact General Neurology

## 2017-07-30 DIAGNOSIS — I255 Ischemic cardiomyopathy: Secondary | ICD-10-CM

## 2017-07-30 DIAGNOSIS — E785 Hyperlipidemia, unspecified: Secondary | ICD-10-CM

## 2017-07-30 LAB — BASIC METABOLIC PANEL
Anion gap: 6 (ref 5–15)
BUN: 24 mg/dL — ABNORMAL HIGH (ref 6–20)
CO2: 28 mmol/L (ref 22–32)
Calcium: 9.1 mg/dL (ref 8.9–10.3)
Chloride: 104 mmol/L (ref 101–111)
Creatinine, Ser: 1.11 mg/dL (ref 0.61–1.24)
GFR calc Af Amer: >60 mL/min (ref >60)
GFR calc non Af Amer: >60 mL/min (ref >60)
Glucose, Bld: 103 mg/dL — ABNORMAL HIGH (ref 65–99)
Potassium: 4.3 mmol/L (ref 3.5–5.1)
Sodium: 138 mmol/L (ref 135–145)

## 2017-07-30 NOTE — Evaluation (Signed)
Speech Language Pathology Evaluation Patient Details Name: Jared [name] MRN: [id number] DOB: [birth year] Today's Date: 07/30/2017 Time: 9417-4081 SLP Time Calculation (min) (ACUTE ONLY): 25 min  Problem List:  Patient Active Problem List   Diagnosis Date Noted  . Ischemic cardiomyopathy   . Hyperlipidemia   . Dizziness 07/27/2017  . Fall at home, initial encounter 07/27/2017  . Frequent PVCs 07/27/2017  . CVA (cerebral vascular accident) ([place]) 07/27/2017  . Diverticulosis of colon without hemorrhage 02/06/2013  . Right inguinal hernia 10/12/2012  . BPH (benign prostatic hyperplasia) 04/08/2009  . Obstructive sleep apnea 10/02/2008  . Anxiety state 10/05/2007  . Depression 10/05/2007  . Essential hypertension 10/05/2007  . Allergic rhinitis 10/05/2007  . GERD 10/05/2007  . History of colonic polyps 10/05/2007  . NEPHROLITHIASIS, HX OF 10/05/2007   Past Medical History:  Past Medical History:  Diagnosis Date  . ALLERGIC RHINITIS 10/05/2007  . ANXIETY 10/05/2007  . BENIGN PROSTATIC HYPERTROPHY 04/08/2009  . COLONIC POLYPS, HX OF 10/05/2007  . DEPRESSION 10/05/2007  . GERD 10/05/2007  . Heart murmur   . HYPERTENSION 10/05/2007  . NEPHROLITHIASIS, HX OF 10/05/2007  . SLEEP APNEA, OBSTRUCTIVE 10/02/2008   Past Surgical History:  Past Surgical History:  Procedure Laterality Date  . APPENDECTOMY    . CATARACT EXTRACTION  2014   bilateral  . CHOLECYSTECTOMY    . FOOT SURGERY     left  . HERNIA REPAIR  2014  . WRIST FRACTURE SURGERY     right x2   left x1   HPI:  Pt is a 76 y.o. male presented with c/o dizziness s/p mechanical fall while mowing the lawn. Pt reports that he has had dizzy episodes periodically over last 3 weeks. 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   Assessment / Plan / Recommendation Clinical  Impression  Patient presents with very mild anomia, dysnomia intermittently in conversation and structured speech tasks. Oral motor assessment is unremarkable, noted soft tissue mass anterior to trachea and larynx; per MD notes lipoma. Speech is 100% intelligible however noted with articulatory errors with increasing length/complexity, rate of speech. Pt reports frustration with the above impairments. He would benefit from [facility] SLP to improve communication after d/c. Educated pt re: strategies for word-finding, slowed rate of speech. No further acute needs identified; SLP will s/o.     SLP Assessment  SLP Recommendation/Assessment: All further Speech Lanaguage Pathology  needs can be addressed in the next venue of care SLP Visit Diagnosis: Aphasia (R47.01);Dysarthria and anarthria (R47.1)    Follow Up Recommendations  Home health SLP    Frequency and Duration           SLP Evaluation Cognition  Overall Cognitive Status: Within Functional Limits for tasks assessed Arousal/Alertness: Awake/alert Orientation Level: Oriented X4 Attention: Focused;Sustained;Selective Focused Attention: Appears intact Sustained Attention: Appears intact Selective Attention: Appears intact Memory: Appears intact Awareness: Appears intact Problem Solving: Appears intact Safety/Judgment: Appears intact       Comprehension  Auditory Comprehension Overall Auditory Comprehension: Appears within functional limits for tasks assessed Yes/No Questions: Within Functional Limits Commands: Within Functional Limits Conversation: Complex Visual Recognition/Discrimination Discrimination: Within Function Limits Reading Comprehension Reading Status: Within funtional limits    Expression Expression Primary Mode of Expression: Verbal Verbal Expression Overall Verbal Expression: Impaired Initiation: No impairment Automatic Speech: Name;Social Response Level of Generative/Spontaneous Verbalization:  Conversation Repetition: No impairment Naming: Impairment Responsive: 76-100% accurate Confrontation: Within functional limits Convergent: 75-100% accurate Divergent: 50-74% accurate Pragmatics: No impairment Effective Techniques: Semantic cues;Open ended questions Non-Verbal Means of Communication: Not applicable Written Expression Dominant Hand: Right Written Expression: Not tested   Oral / Motor  Oral Motor/Sensory Function Overall Oral Motor/Sensory Function: Within functional limits Motor Speech Overall Motor Speech: Impaired Respiration: Within functional limits Phonation: Normal Resonance: Within functional limits Articulation: Impaired Level of Impairment: Conversation Intelligibility: Intelligible Motor Planning: Impaired Level of Impairment: Insurance risk surveyor Errors: Aware;Inconsistent Effective Techniques: Slow rate;Pause   GO                   Deneise Lever, [place], CCC-SLP Speech-Language Pathologist [phone]  Aliene Altes 07/30/2017, 5:37 PM

## 2017-07-30 NOTE — Progress Notes (Signed)
STROKE TEAM PROGRESS NOTE   SUBJECTIVE (INTERVAL HISTORY) A friend is at the bedside.  He has no complaints No new events reported overnight.     OBJECTIVE Recent Labs  Lab 07/26/17 1949 07/26/17 2042  GLUCAP 103* 89   Recent Labs  Lab 07/26/17 1941  07/26/17 1947 07/27/17 0519 07/28/17 0337 07/29/17 0442 07/30/17 0355  NA  --   --  140 140 137 139 138  K  --   --  3.6 3.2* 3.5 3.9 4.3  CL  --   --  104 106 103 106 104  CO2  --   --  29 26 28 28 28   GLUCOSE  --   --  98 135* 115* 90 103*  BUN  --   --  20 19 18 20  24*  CREATININE  --   --  1.05 0.95 1.01 1.01 1.11  CALCIUM  --    < > 9.6 8.8* 9.2 9.0 9.1  MG 1.7  --   --   --  1.6* 1.9  --    < > = values in this interval not displayed.   Recent Labs  Lab 07/26/17 1947 07/28/17 0337  AST 28 30  ALT 32 31  ALKPHOS 83 78  BILITOT 0.6 1.0  PROT 7.1 6.5  ALBUMIN 3.9 3.4*   Recent Labs  Lab 07/26/17 1947 07/27/17 0519 07/28/17 0337  WBC 7.4 5.7 8.8  NEUTROABS 4.6 2.9 5.6  HGB 14.9 13.5 14.0  HCT 41.9 38.7* 40.0  MCV 88.6 89.0 87.9  PLT 187 170 147*   Recent Labs  Lab 07/27/17 0016 07/27/17 0519 07/27/17 1254  TROPONINI <0.03 <0.03 <0.03   No results for input(s): LABPROT, INR in the last 72 hours. No results for input(s): COLORURINE, LABSPEC, [place], GLUCOSEU, HGBUR, BILIRUBINUR, KETONESUR, PROTEINUR, UROBILINOGEN, NITRITE, LEUKOCYTESUR in the last 72 hours.  Invalid input(s): APPERANCEUR     Component Value Date/Time   CHOL 162 07/27/2017 0519   TRIG 95 07/27/2017 0519   HDL 41 07/27/2017 0519   CHOLHDL 4.0 07/27/2017 0519   VLDL 19 07/27/2017 0519   LDLCALC 102 (H) 07/27/2017 0519   Lab Results  Component Value Date   HGBA1C 5.3 07/27/2017   No results found for: LABOPIA, COCAINSCRNUR, LABBENZ, AMPHETMU, THCU, LABBARB  No results for input(s): ETH in the last 168 hours.  IMAGING: I have personally reviewed the radiological images below and agree with the radiology  interpretations.  Ct Angio Head & Neck W Or Wo Contrast Result Date: 07/28/2017 IMPRESSION: No branch occlusion, stenosis, or visible embolic source. Atherosclerosis is mild for age. Electronically Signed   By: Monte Fantasia M.D.   On: 07/28/2017 09:33   Ct Head Wo Contrast Result Date: 07/26/2017 IMPRESSION: No acute intracranial abnormalities. Chronic atrophy and small vessel ischemic changes. Electronically Signed   By: Lucienne Capers M.D.   On: 07/26/2017 21:55   Mr Brain Wo Contrast Result Date: 07/27/2017 IMPRESSION: 1. Small acute infarct of the left caudate body. No hemorrhage or mass effect. 2. Chronic ischemic microangiopathy and generalized volume loss. Electronically Signed   By: Ulyses Jarred M.D.   On: 07/27/2017 03:21   ECHO 07/27/17 Study Conclusions - Left ventricle: The cavity size was normal. Wall thickness was   normal. Systolic function was moderately reduced. The estimated   ejection fraction was in the range of 35% to 40%. Mild diffuse   hypokinesis with no identifiable regional variations. Features   are consistent with a pseudonormal  left ventricular filling   pattern, with concomitant abnormal relaxation and increased   filling pressure (grade 2 diastolic dysfunction). - Mitral valve: There was mild regurgitation. - Left atrium: The atrium was mildly dilated. - Right atrium: The atrium was mildly dilated. - Pulmonary arteries: Systolic pressure was mildly increased. PA   peak pressure: 34 mm Hg (S).   PHYSICAL EXAM Temp:  [97.6 F (36.4 C)-98.9 F (37.2 C)] 98.3 F (36.8 C) (11/11 0846) Pulse Rate:  [44-65] 46 (11/11 0946) Resp:  [18] 18 (11/11 0433) BP: (137-161)/(70-76) 161/70 (11/11 0846) SpO2:  [97 %-98 %] 98 % (11/11 0846)  General -pleasant elderly Caucasian male, in no apparent distress Respiratory - Lungs clear bilaterally. No wheezing. Cardiovascular - Iregular rate and rhythm   Neurological Examination Mental Status: Alert, oriented,  thought content appropriate.  Speech fluent without evidence of aphasia.  Some very mild word finding difficulties. Able to follow 3 step commands without difficulty. Cranial Nerves: II: Discs flat bilaterally; Visual fields grossly normal,  III,IV, VI: ptosis not present, extra-ocular motions intact bilaterally, pupils equal, round, reactive to light and accommodation V,VII: smile symmetric, facial light touch sensation normal bilaterally VIII: hearing normal bilaterally IX,X: uvula rises symmetrically, tremor-like movement noted in chin, patient has habit of clicking teeth continuously. When asked to stop the tremor like movement stopped. XI: bilateral shoulder shrug XII: midline tongue extension Motor: Right :  Upper extremity   5/5                                      Left:     Upper extremity   5/5             Lower extremity   5/5                                                  Lower extremity   5/5 Tone and bulk:normal tone throughout; no atrophy noted Sensory: Pinprick and light touch intact throughout, bilaterally Deep Tendon Reflexes: 3-4+ and symmetric throughout Plantars: Right: downgoing                                Left: downgoing Cerebellar: normal finger-to-nose, and normal heel-to-shin test Gait: Not tested   ASSESSMENT AND PLAN: Jared [name] is a 76 y.o. male with PMH of HTN, OSA, BPH admitted for episode of dizziness and fall at home on 07/26/17.  Stroke: Acute Left caudate body Infarct. location consistent with small vessel disease but also possible cardioembolic source with low EF 35-40% and frequent PACs and PVCs  Resultant Symptoms - All admission symptoms have resolved  MRI Head - Small acute infarct of the left caudate body.  CTA Head/Neck - No branch occlusion, stenosis, or visible embolic source  CT Head -No acute intracranial abnormalities  2D Echo - EF 35 - 40%, Grade 2 DD  Will need TEE and Loop Recorder    LDL -  102  HgbA1c 5.3  VTE  prophylaxis - Lovenox   Diet - Diet Heart Room service appropriate? Yes; Fluid consistency: Thin   No antithrombotic prior to admission, now on aspirin 325 mg daily.   Please discharge patient on  aspirin 325  mg daily  Patient counseled to be compliant with her antithrombotic medications  Ongoing aggressive stroke risk factor management  Therapy recommendations:  HH  Disposition: HOME  Follow up with [facility] Dr. Erlinda Hong in 6 weeks  Cardiomyopathy with EF 35-40%:  Cardiology consulted  Will do lexiscan for ischemic work up  Agree with TEE and loop  Put on coreg and losartan  Frequent PVCs and PACs  Cardiology on board  Put on coreg   Will have TEE and loop as per cardiology  HYPERTENSION: Stable Permissive hypertension (OK if <220/120) for 24-48 hours post stroke and then gradually normalized within 5-7 days.  Long term BP goal normotensive.   May slowly restart home B/P medications after 48 hours with close PCP follow up.  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 Patient states it Dx when he was 76 years old.  Hypomagnesemia - replacement per primary team  Hospital day # 3     I reviewed above note and agree with the assessment and plan. I have made any additions or clarifications directly to the above note. Pt was seen and examined. Symptoms resolved but stated that he has been lack of energy for several months. Found to have cardiomyopathy with EF 35-40% with frequent PACs and PVCs. Cardiology on board,   Agree with TEE and loop. Although pt stroke location consistent with small vessel disease but need to rule out cardioembolic source given low EF and arrhythmia. On coreg and losartan.  Will continue ASA and liptor this time. D/W Dr Posey Pronto  Antony Contras, MD Stroke Neurology 07/30/2017 2:16 PM     To contact Stroke Continuity provider, please refer to [email or web address] After hours, contact General Neurology

## 2017-07-30 NOTE — Progress Notes (Signed)
Progress Note  Patient Name: Jared [name] Date of Encounter: 07/30/2017  Primary Cardiologist: Dr. Stanford Breed  Subjective   Denies chest pain or SOB.    Inpatient Medications    Scheduled Meds: . aspirin EC  325 mg Oral Daily  . atorvastatin  40 mg Oral q1800  . buPROPion  150 mg Oral BID  . carvedilol  6.25 mg Oral BID WC  . enoxaparin (LOVENOX) injection  40 mg Subcutaneous Q24H  . finasteride  5 mg Oral Daily  . fluorometholone  1 drop Both Eyes TID  . losartan  50 mg Oral Daily  . magnesium oxide  400 mg Oral BID  . pantoprazole  40 mg Oral Daily  . timolol  1 drop Both Eyes Daily  . traZODone  100 mg Oral QHS   Continuous Infusions:  PRN Meds: acetaminophen **OR** acetaminophen (TYLENOL) oral liquid 160 mg/5 mL **OR** acetaminophen, ALPRAZolam   Vital Signs    Vitals:   07/30/17 0100 07/30/17 0433 07/30/17 0846 07/30/17 0946  BP: 137/76 (!) 152/73 (!) 161/70   Pulse: (!) 47 (!) 48 (!) 44 (!) 46  Resp: 18 18    Temp: 97.6 F (36.4 C) 98.8 F (37.1 C) 98.3 F (36.8 C)   TempSrc: Oral Oral Oral   SpO2: 98% 97% 98%   Weight:      Height:        Intake/Output Summary (Last 24 hours) at 07/30/2017 1325 Last data filed at 07/30/2017 0811 Gross per 24 hour  Intake 120 ml  Output -  Net 120 ml   Filed Weights   07/27/17 0530  Weight: 186 lb 9.6 oz (84.6 kg)    Telemetry    NSR, sinus brady, PACs, PVCs - Personally Reviewed  ECG    NA - Personally Reviewed  Physical Exam   GEN: No acute distress.   Neck: No  JVD Cardiac: RRR, no murmurs, rubs, or gallops.  Respiratory: Clear  to auscultation bilaterally. GI: Soft, nontender, non-distended  MS: No  edema; No deformity. Neuro:  Nonfocal  Psych: Normal affect   Labs    Chemistry Recent Labs  Lab 07/26/17 1947  07/28/17 0337 07/29/17 0442 07/30/17 0355  NA 140   < > 137 139 138  K 3.6   < > 3.5 3.9 4.3  CL 104   < > 103 106 104  CO2 29   < > 28 28 28   GLUCOSE 98   < > 115* 90 103*    BUN 20   < > 18 20 24*  CREATININE 1.05   < > 1.01 1.01 1.11  CALCIUM 9.6   < > 9.2 9.0 9.1  PROT 7.1  --  6.5  --   --   ALBUMIN 3.9  --  3.4*  --   --   AST 28  --  30  --   --   ALT 32  --  31  --   --   ALKPHOS 83  --  78  --   --   BILITOT 0.6  --  1.0  --   --   GFRNONAA >60   < > >60 >60 >60  GFRAA >60   < > >60 >60 >60  ANIONGAP 7   < > 6 5 6    < > = values in this interval not displayed.     Hematology Recent Labs  Lab 07/26/17 1947 07/27/17 0519 07/28/17 0337  WBC 7.4 5.7 8.8  RBC 4.73 4.35 4.55  HGB 14.9 13.5 14.0  HCT 41.9 38.7* 40.0  MCV 88.6 89.0 87.9  MCH 31.5 31.0 30.8  MCHC 35.6 34.9 35.0  RDW 12.3 12.5 12.1  PLT 187 170 147*    Cardiac Enzymes Recent Labs  Lab 07/27/17 0016 07/27/17 0519 07/27/17 1254  TROPONINI <0.03 <0.03 <0.03    Recent Labs  Lab 07/26/17 1953  TROPIPOC 0.00     BNP Recent Labs  Lab 07/28/17 1603  BNP 282.7*     DDimer No results for input(s): DDIMER in the last 168 hours.   Radiology    Nm Myocar Multi W/spect W/wall Motion / Ef  Result Date: 07/29/2017  There was no ST segment deviation noted during stress.  Findings consistent with prior moderate to large mid to distal anterior and apical myocardial infarction with moderate peri-infarct ischemia. Small basal septal infarct.  The left ventricular ejection fraction is moderately decreased (30-44%).  This is a high risk study. High risk based on decreased LVEF and moderate ischemia.  Nuclear stress EF: 32%.     Cardiac Studies   ECHO:    Study Conclusions  - Left ventricle: The cavity size was normal. Wall thickness was   normal. Systolic function was moderately reduced. The estimated   ejection fraction was in the range of 35% to 40%. Mild diffuse   hypokinesis with no identifiable regional variations. Features   are consistent with a pseudonormal left ventricular filling   pattern, with concomitant abnormal relaxation and increased   filling  pressure (grade 2 diastolic dysfunction). - Mitral valve: There was mild regurgitation. - Left atrium: The atrium was mildly dilated. - Right atrium: The atrium was mildly dilated. - Pulmonary arteries: Systolic pressure was mildly increased. PA   peak pressure: 34 mm Hg (S).   Patient Profile     76 y.o. male with a hx of HTN, frequent PVCs, anxiety, GERD, and OSAwho is being seen today for the evaluation of new onset systolic and diastolic heart failureat the request of Dr. Posey Pronto.    Assessment & Plan    CARDIOMYOPATHY:  Discussed with the patient.  Probable old anterior infarct.   Can consider invasive imaging after he is further removed from his acute CVA.   Trop were negative this admission.  He denies any chest pain or SLB.   HTN:    Allowing for more permissive HTN for a couple of days.    PVCs:  No sustained arrhythmia.  No further work up.    CVA:  TEE for Monday.    For questions or updates, please contact [place] Please consult [email or web address] for contact info under Cardiology/STEMI.   Signed, Minus Breeding, MD  07/30/2017, 1:25 PM

## 2017-07-30 NOTE — Progress Notes (Signed)
[facility] Progress Note  Patient: Jared [name]:[id number]   PCP: Marletta Lor, MD DOB: 02/01/1941   DOA: 07/26/2017   DOS: 07/30/2017   Date of Service: the patient was seen and examined on 07/30/2017  Subjective: No acute complaints.  Speech better.  Anxiety better.  No nausea no vomiting.  Brief hospital course: Pt. with PMH of HTN, anxiety, BPH; admitted on 07/26/2017, presented with complaint of dizziness, was found to have acute CVA as well as acute cardiomyopathy. Currently further plan is further workup for stroke as well as cardiomyopathy.  Assessment and Plan: 1.  Acute left caudate body CVA. Likely small vessel disease but possibility of embolus cannot be ruled out. Echocardiogram shows 35-40% EF. LDL 102 on Lipitor 40 mg. On aspirin 325 mg as well. PT OT recommends home with home health. CTA head and neck shows no acute stenosis or obstruction. Symptoms are improving right now. Hemoglobin A1c 5.3. Pt will require a TEE to rule out any possibility of embolus as well as may require loop recorder which will all be arranged on Monday. Monitor over the weekend on telemetry.  2.  Acute cardiomyopathy, systolic dysfunction without any CHF. Echocardiogram shows EF of 63-87%, grade 2 diastolic dysfunction. No specific mention about wall motion abnormality. Patient did not have any chest pain or any ACS symptoms in the past. Cardiology consulted, NM stress test high risk-although probably old anterior infarct.  Will require invasive workup once recovered from acute CVA.  Patient will be started on Coreg as well as losartan. Patient does have bradycardia as well as symptomatic PVCs continue to monitor on telemetry overnight.  3.  Essential hypertension. Patient will be started on Coreg and losartan per cardiology. Monitor blood pressure.  4.  Chronic neck lesion. Simple appearing lipoma based on the CT scan. No further workup recommended at present.  5.   Hypokalemia and hypomagnesemia. Stable, monitor  Diet: Cardiac diet DVT Prophylaxis: subcutaneous Heparin  Advance goals of care discussion: full code  Family Communication: no family was present at bedside, at the time of interview.  Disposition:  Discharge to home with home health.  Consultants: cardiology neurology  Procedures: Echocardiogram   Antibiotics: Anti-infectives (From admission, onward)   None       Objective: Physical Exam: Vitals:   07/30/17 0433 07/30/17 0846 07/30/17 0946 07/30/17 1438  BP: (!) 152/73 (!) 161/70  (!) 154/80  Pulse: (!) 48 (!) 44 (!) 46 (!) 55  Resp: 18   18  Temp: 98.8 F (37.1 C) 98.3 F (36.8 C)  98 F (36.7 C)  TempSrc: Oral Oral  Oral  SpO2: 97% 98%  98%  Weight:      Height:        Intake/Output Summary (Last 24 hours) at 07/30/2017 1507 Last data filed at 07/30/2017 1439 Gross per 24 hour  Intake 360 ml  Output -  Net 360 ml   Filed Weights   07/27/17 0530  Weight: 84.6 kg (186 lb 9.6 oz)   General: Alert, Awake and Oriented to Time, Place and Person. Appear in mild distress, affect appropriate Eyes: PERRL, Conjunctiva normal ENT: Oral Mucosa clear moist. Neck: no JVD, no Abnormal Mass Or lumps Cardiovascular: S1 and S2 Present, no Murmur, Peripheral Pulses Present Respiratory: normal respiratory effort, Bilateral Air entry equal and Decreased, no use of accessory muscle, Clear to Auscultation, no Crackles, no wheezes Abdomen: Bowel Sound present, Soft and no tenderness, no hernia Skin: no redness, no Rash, no induration  Extremities: no Pedal edema, no calf tenderness Neurologic: Grossly no focal neuro deficit. Bilaterally Equal motor strength  Data Reviewed: CBC: Recent Labs  Lab 07/26/17 1947 07/27/17 0519 07/28/17 0337  WBC 7.4 5.7 8.8  NEUTROABS 4.6 2.9 5.6  HGB 14.9 13.5 14.0  HCT 41.9 38.7* 40.0  MCV 88.6 89.0 87.9  PLT 187 170 599*   Basic Metabolic Panel: Recent Labs  Lab 07/26/17 1941  07/26/17 1947 07/27/17 0519 07/28/17 0337 07/29/17 0442 07/30/17 0355  NA  --  140 140 137 139 138  K  --  3.6 3.2* 3.5 3.9 4.3  CL  --  104 106 103 106 104  CO2  --  29 26 28 28 28   GLUCOSE  --  98 135* 115* 90 103*  BUN  --  20 19 18 20  24*  CREATININE  --  1.05 0.95 1.01 1.01 1.11  CALCIUM  --  9.6 8.8* 9.2 9.0 9.1  MG 1.7  --   --  1.6* 1.9  --     Liver Function Tests: Recent Labs  Lab 07/26/17 1947 07/28/17 0337  AST 28 30  ALT 32 31  ALKPHOS 83 78  BILITOT 0.6 1.0  PROT 7.1 6.5  ALBUMIN 3.9 3.4*   No results for input(s): LIPASE, AMYLASE in the last 168 hours. No results for input(s): AMMONIA in the last 168 hours. Coagulation Profile: Recent Labs  Lab 07/26/17 1947  INR 1.03   Cardiac Enzymes: Recent Labs  Lab 07/27/17 0016 07/27/17 0519 07/27/17 1254  TROPONINI <0.03 <0.03 <0.03   BNP (last 3 results) No results for input(s): PROBNP in the last 8760 hours. CBG: Recent Labs  Lab 07/26/17 1949 07/26/17 2042  GLUCAP 103* 89   Studies: No results found.  Scheduled Meds: . aspirin EC  325 mg Oral Daily  . atorvastatin  40 mg Oral q1800  . buPROPion  150 mg Oral BID  . carvedilol  6.25 mg Oral BID WC  . enoxaparin (LOVENOX) injection  40 mg Subcutaneous Q24H  . finasteride  5 mg Oral Daily  . fluorometholone  1 drop Both Eyes TID  . losartan  50 mg Oral Daily  . magnesium oxide  400 mg Oral BID  . pantoprazole  40 mg Oral Daily  . timolol  1 drop Both Eyes Daily  . traZODone  100 mg Oral QHS   Continuous Infusions: PRN Meds: acetaminophen **OR** acetaminophen (TYLENOL) oral liquid 160 mg/5 mL **OR** acetaminophen, ALPRAZolam  Time spent: 35 minutes  Author: Berle Mull, MD [facility] Pager: [phone] 07/30/2017 3:07 PM  If 7PM-7AM, please contact night-coverage at [email or web address], password [facility]

## 2017-07-31 ENCOUNTER — Encounter (HOSPITAL_COMMUNITY)
Admission: EM | Disposition: A | Discharge status: Home Health | Payer: Self-pay | Source: Home / Self Care | Attending: Internal Medicine

## 2017-07-31 ENCOUNTER — Ambulatory Visit (HOSPITAL_COMMUNITY): Admit: 2017-07-31 | Payer: PPO | Admitting: Internal Medicine

## 2017-07-31 ENCOUNTER — Encounter (HOSPITAL_COMMUNITY): Payer: Self-pay | Admitting: *Deleted

## 2017-07-31 ENCOUNTER — Inpatient Hospital Stay (HOSPITAL_COMMUNITY): Payer: PPO

## 2017-07-31 DIAGNOSIS — Z95818 Presence of other cardiac implants and grafts: Secondary | ICD-10-CM

## 2017-07-31 DIAGNOSIS — I6389 Other cerebral infarction: Secondary | ICD-10-CM

## 2017-07-31 DIAGNOSIS — I340 Nonrheumatic mitral (valve) insufficiency: Secondary | ICD-10-CM

## 2017-07-31 HISTORY — PX: TEE WITHOUT CARDIOVERSION: SHX5443

## 2017-07-31 HISTORY — PX: LOOP RECORDER INSERTION: EP1214

## 2017-07-31 HISTORY — DX: Presence of other cardiac implants and grafts: Z95.818

## 2017-07-31 LAB — BASIC METABOLIC PANEL
Anion gap: 5 (ref 5–15)
BUN: 20 mg/dL (ref 6–20)
CO2: 31 mmol/L (ref 22–32)
Calcium: 9 mg/dL (ref 8.9–10.3)
Chloride: 106 mmol/L (ref 101–111)
Creatinine, Ser: 0.99 mg/dL (ref 0.61–1.24)
GFR calc Af Amer: >60 mL/min (ref >60)
GFR calc non Af Amer: >60 mL/min (ref >60)
Glucose, Bld: 101 mg/dL — ABNORMAL HIGH (ref 65–99)
Potassium: 3.9 mmol/L (ref 3.5–5.1)
Sodium: 142 mmol/L (ref 135–145)

## 2017-07-31 LAB — GLUCOSE, CAPILLARY: Glucose-Capillary: 90 mg/dL (ref 65–99)

## 2017-07-31 SURGERY — ECHOCARDIOGRAM, TRANSESOPHAGEAL
Anesthesia: Moderate Sedation

## 2017-07-31 SURGERY — LOOP RECORDER INSERTION

## 2017-07-31 MED ORDER — ACETAMINOPHEN 325 MG PO TABS: 325.0000 mg | ORAL_TABLET | ORAL | Status: DC | PRN

## 2017-07-31 MED ORDER — FENTANYL CITRATE (PF) 100 MCG/2ML IJ SOLN
INTRAMUSCULAR | Status: DC | PRN
Start: 2017-07-31 — End: 2017-07-31
  Administered 2017-07-31: 25 ug via INTRAVENOUS

## 2017-07-31 MED ORDER — FENTANYL CITRATE (PF) 100 MCG/2ML IJ SOLN
INTRAMUSCULAR | Status: AC
  Filled 2017-07-31: qty 2

## 2017-07-31 MED ORDER — ONDANSETRON HCL 4 MG/2ML IJ SOLN: 4.0000 mg | Freq: Four times a day (QID) | INTRAMUSCULAR | Status: DC | PRN

## 2017-07-31 MED ORDER — SODIUM CHLORIDE 0.9 % IV SOLN
INTRAVENOUS | Status: DC
  Administered 2017-07-31: 14:00:00 via INTRAVENOUS

## 2017-07-31 MED ORDER — LIDOCAINE-EPINEPHRINE 1 %-1:100000 IJ SOLN
INTRAMUSCULAR | Status: DC | PRN
  Administered 2017-07-31: 30 mL

## 2017-07-31 MED ORDER — HYDRALAZINE HCL 20 MG/ML IJ SOLN
INTRAMUSCULAR | Status: DC | PRN
  Administered 2017-07-31: 10 mg via INTRAVENOUS

## 2017-07-31 MED ORDER — LOSARTAN POTASSIUM 50 MG PO TABS: 50.0000 mg | ORAL_TABLET | Freq: Every day | ORAL | 0 refills | Status: DC

## 2017-07-31 MED ORDER — MIDAZOLAM HCL 10 MG/2ML IJ SOLN
INTRAMUSCULAR | Status: DC | PRN
Start: 2017-07-31 — End: 2017-07-31
  Administered 2017-07-31 (×2): 2 mg via INTRAVENOUS

## 2017-07-31 MED ORDER — LIDOCAINE-EPINEPHRINE 1 %-1:100000 IJ SOLN
INTRAMUSCULAR | Status: AC
  Filled 2017-07-31: qty 1

## 2017-07-31 MED ORDER — BUTAMBEN-TETRACAINE-BENZOCAINE 2-2-14 % EX AERO
INHALATION_SPRAY | CUTANEOUS | Status: DC | PRN
  Administered 2017-07-31: 2 via TOPICAL

## 2017-07-31 MED ORDER — ATORVASTATIN CALCIUM 40 MG PO TABS: 40.0000 mg | ORAL_TABLET | Freq: Every day | ORAL | 0 refills | Status: DC

## 2017-07-31 MED ORDER — CARVEDILOL 6.25 MG PO TABS: 6.2500 mg | ORAL_TABLET | Freq: Two times a day (BID) | ORAL | 0 refills | Status: DC

## 2017-07-31 MED ORDER — MIDAZOLAM HCL 5 MG/ML IJ SOLN
INTRAMUSCULAR | Status: AC
  Filled 2017-07-31: qty 2

## 2017-07-31 MED ORDER — ASPIRIN 325 MG PO TBEC: 325.0000 mg | DELAYED_RELEASE_TABLET | Freq: Every day | ORAL | 0 refills | Status: DC

## 2017-07-31 MED ORDER — HYDRALAZINE HCL 20 MG/ML IJ SOLN
INTRAMUSCULAR | Status: AC
  Filled 2017-07-31: qty 1

## 2017-07-31 SURGICAL SUPPLY — 2 items
LOOP REVEAL LINQSYS (Prosthesis & Implant Heart) ×2 IMPLANT
PACK LOOP INSERTION (CUSTOM PROCEDURE TRAY) ×3 IMPLANT

## 2017-07-31 NOTE — Discharge Instructions (Signed)
Monitor implant site care instructions Keep incision clean and dry for 3 days. You can remove outer dressing tomorrow. Leave steri-strips (little pieces of tape) on until seen in the office for wound check appointment. Call the office [phone]) for redness, drainage, swelling, or fever.

## 2017-07-31 NOTE — Interval H&P Note (Signed)
History and Physical Interval Note:  07/31/2017 2:07 PM  Jared [name]  has presented today for surgery, with the diagnosis of stroke  The various methods of treatment have been discussed with the patient and family. After consideration of risks, benefits and other options for treatment, the patient has consented to  Procedure(s): TRANSESOPHAGEAL ECHOCARDIOGRAM (TEE) WITH LOOP (N/A) as a surgical intervention .  The patient's history has been reviewed, patient examined, no change in status, stable for surgery.  I have reviewed the patient's chart and labs.  Questions were answered to the patient's satisfaction.     [name]

## 2017-07-31 NOTE — CV Procedure (Signed)
     Transesophageal Echocardiogram Note  TYMEL CONELY [id number] 1941/03/17  Procedure: Transesophageal Echocardiogram Indications: Stroke  Procedure Details Consent: Obtained Time Out: Verified patient identification, verified procedure, site/side was marked, verified correct patient position, special equipment/implants available, Radiology Safety Procedures followed,  medications/allergies/relevent history reviewed, required imaging and test results available.  Performed  Medications: During this procedure the patient is administered a total of Versed 4 mg and Fentanyl 50 mcg to achieve and maintain moderate conscious sedation.  The patient's heart rate, blood pressure, and oxygen saturation are monitored continuously during the procedure. The period of conscious sedation is 30 minutes, of which I was present face-to-face 100% of this time.  - Left ventricle: Systolic function was normal. The estimated  ejection fraction was in the range of 60% to 65%. Wall motion was  normal; there were no regional wall motion abnormalities.  - Aorta: There was severe non-mobile atheroma.  - Ascending aorta: The ascending aorta was normal in size.  - Descending aorta: The descending aorta was normal in size.  - Mitral valve: There was mild regurgitation.  - Left atrium: No evidence of thrombus in the atrial cavity or  appendage. No evidence of thrombus in the atrial cavity or  appendage.  - Right ventricle: Systolic function was normal.  - Right atrium: The atrium was dilated. No evidence of thrombus in  the atrial cavity or appendage.  - Atrial septum: No defect or patent foramen ovale was identified.  - Pericardium, extracardiac: There was no pericardial effusion.  Impressions:  - No cardiac source of emboli was indentified.  Complications: No apparent complications Patient did tolerate procedure well.  [name], MD, [facility] 07/31/2017, 4:39 PM

## 2017-07-31 NOTE — H&P (View-Only) (Signed)
STROKE TEAM PROGRESS NOTE   SUBJECTIVE (INTERVAL HISTORY) No family at the bedside.  He has no complaints No new events reported overnight. Patient is anxious to go home. TEE scheduled for 3PM today. Has been walking with PT in hallways.    OBJECTIVE Recent Labs  Lab 07/26/17 1949 07/26/17 2042 07/31/17 1113  GLUCAP 103* 89 90   Recent Labs  Lab 07/26/17 1941  07/27/17 0519 07/28/17 0337 07/29/17 0442 07/30/17 0355 07/31/17 0529  NA  --    < > 140 137 139 138 142  K  --    < > 3.2* 3.5 3.9 4.3 3.9  CL  --    < > 106 103 106 104 106  CO2  --    < > 26 28 28 28 31   GLUCOSE  --    < > 135* 115* 90 103* 101*  BUN  --    < > 19 18 20  24* 20  CREATININE  --    < > 0.95 1.01 1.01 1.11 0.99  CALCIUM  --    < > 8.8* 9.2 9.0 9.1 9.0  MG 1.7  --   --  1.6* 1.9  --   --    < > = values in this interval not displayed.   Recent Labs  Lab 07/26/17 1947 07/28/17 0337  AST 28 30  ALT 32 31  ALKPHOS 83 78  BILITOT 0.6 1.0  PROT 7.1 6.5  ALBUMIN 3.9 3.4*   Recent Labs  Lab 07/26/17 1947 07/27/17 0519 07/28/17 0337  WBC 7.4 5.7 8.8  NEUTROABS 4.6 2.9 5.6  HGB 14.9 13.5 14.0  HCT 41.9 38.7* 40.0  MCV 88.6 89.0 87.9  PLT 187 170 147*   Recent Labs  Lab 07/27/17 0016 07/27/17 0519 07/27/17 1254  TROPONINI <0.03 <0.03 <0.03   No results for input(s): LABPROT, INR in the last 72 hours. No results for input(s): COLORURINE, LABSPEC, [place], GLUCOSEU, HGBUR, BILIRUBINUR, KETONESUR, PROTEINUR, UROBILINOGEN, NITRITE, LEUKOCYTESUR in the last 72 hours.  Invalid input(s): APPERANCEUR     Component Value Date/Time   CHOL 162 07/27/2017 0519   TRIG 95 07/27/2017 0519   HDL 41 07/27/2017 0519   CHOLHDL 4.0 07/27/2017 0519   VLDL 19 07/27/2017 0519   LDLCALC 102 (H) 07/27/2017 0519   Lab Results  Component Value Date   HGBA1C 5.3 07/27/2017   No results found for: LABOPIA, COCAINSCRNUR, LABBENZ, AMPHETMU, THCU, LABBARB  No results for input(s): ETH in the last 168  hours.  IMAGING: I have personally reviewed the radiological images below and agree with the radiology interpretations.  Ct Angio Head & Neck W Or Wo Contrast Result Date: 07/28/2017 IMPRESSION: No branch occlusion, stenosis, or visible embolic source. Atherosclerosis is mild for age. Electronically Signed   By: Monte Fantasia M.D.   On: 07/28/2017 09:33   Ct Head Wo Contrast Result Date: 07/26/2017 IMPRESSION: No acute intracranial abnormalities. Chronic atrophy and small vessel ischemic changes. Electronically Signed   By: Lucienne Capers M.D.   On: 07/26/2017 21:55   Mr Brain Wo Contrast Result Date: 07/27/2017 IMPRESSION: 1. Small acute infarct of the left caudate body. No hemorrhage or mass effect. 2. Chronic ischemic microangiopathy and generalized volume loss. Electronically Signed   By: Ulyses Jarred M.D.   On: 07/27/2017 03:21   ECHO 07/27/17 Study Conclusions - Left ventricle: The cavity size was normal. Wall thickness was   normal. Systolic function was moderately reduced. The estimated   ejection fraction  was in the range of 35% to 40%. Mild diffuse   hypokinesis with no identifiable regional variations. Features   are consistent with a pseudonormal left ventricular filling   pattern, with concomitant abnormal relaxation and increased   filling pressure (grade 2 diastolic dysfunction). - Mitral valve: There was mild regurgitation. - Left atrium: The atrium was mildly dilated. - Right atrium: The atrium was mildly dilated. - Pulmonary arteries: Systolic pressure was mildly increased. PA   peak pressure: 34 mm Hg (S).  Nuclear Stress Test  07/29/17  There was no ST segment deviation noted during stress.  Findings consistent with prior moderate to large mid to distal anterior and apical myocardial infarction with moderate peri-infarct ischemia. Small basal septal infarct.  The left ventricular ejection fraction is moderately decreased (30-44%).  This is a high risk  study. High risk based on decreased LVEF and moderate ischemia.  Nuclear stress EF: 32%.  PHYSICAL EXAM Temp:  [98 F (36.7 C)-98.4 F (36.9 C)] 98.4 F (36.9 C) (11/12 1000) Pulse Rate:  [46-65] 65 (11/12 1000) Resp:  [16-18] 18 (11/12 1000) BP: (142-157)/(68-81) 142/74 (11/12 1000) SpO2:  [96 %-99 %] 99 % (11/12 1000)  General -pleasant elderly Caucasian male, in no apparent distress Respiratory - Lungs clear bilaterally. No wheezing. Cardiovascular - Iregular rate and rhythm   Neurological Examination Mental Status: Alert, oriented, thought content appropriate.  Speech fluent without evidence of aphasia.  Some very mild word finding difficulties. Able to follow 3 step commands without difficulty. Cranial Nerves: II: Discs flat bilaterally; Visual fields grossly normal,  III,IV, VI: ptosis not present, extra-ocular motions intact bilaterally, pupils equal, round, reactive to light and accommodation V,VII: smile symmetric, facial light touch sensation normal bilaterally VIII: hearing normal bilaterally IX,X: uvula rises symmetrically, tremor-like movement noted in chin, patient has habit of clicking teeth continuously. When asked to stop the tremor like movement stopped. XI: bilateral shoulder shrug XII: midline tongue extension Motor: Right :  Upper extremity   5/5                                      Left:     Upper extremity   5/5             Lower extremity   5/5                                                  Lower extremity   5/5 Tone and bulk:normal tone throughout; no atrophy noted Sensory: Pinprick and light touch intact throughout, bilaterally Deep Tendon Reflexes: 3-4+ and symmetric throughout Plantars: Right: downgoing                                Left: downgoing Cerebellar: normal finger-to-nose, and normal heel-to-shin test Gait: Not tested   ASSESSMENT AND PLAN: [name] is a 76 y.o. male with PMH of HTN, OSA, BPH admitted for episode of dizziness  and fall at home on 07/26/17.  07/31/17:  Neuro exam remains stable. TEE and Loop scheduled for later today and then patient to be discharged home. POC discussed wit Dr Posey Pronto. Continue ASA and Liptor  Stroke: Acute Left caudate body Infarct. location consistent  with small vessel disease but also possible cardioembolic source with low EF 35-40% and frequent PACs and PVCs  Resultant Symptoms - All admission symptoms have resolved  MRI Head - Small acute infarct of the left caudate body.  CTA Head/Neck - No branch occlusion, stenosis, or visible embolic source  CT Head -No acute intracranial abnormalities  2D Echo - EF 35 - 40%, Grade 2 DD  TEE and Loop Recorder  - today at 3PM  LDL -  102  HgbA1c 5.3  VTE prophylaxis - Lovenox   Diet - Diet NPO time specified   No antithrombotic prior to admission, now on aspirin 325 mg daily.   Please discharge patient on  aspirin 325 mg daily  Patient counseled to be compliant with her antithrombotic medications  Ongoing aggressive stroke risk factor management  Therapy recommendations:  HH  Disposition: HOME  Follow up with [facility] Dr. Erlinda Hong in 6 weeks  Cardiomyopathy with EF 35-40%:  Cardiology consulted  Will do lexiscan for ischemic work up  TEE and loop today  Put on coreg and losartan  Frequent PVCs and PACs  Cardiology on board  Put on coreg   Will have TEE and loop as per cardiology  HYPERTENSION: Stable Permissive hypertension (OK if <220/120) for 24-48 hours post stroke and then gradually normalized within 5-7 days.  Long term BP goal normotensive.   May slowly restart home B/P medications after 48 hours with close PCP follow up.  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 Patient states it Dx when he was 76 years old.  Hypomagnesemia - replacement per primary team  Hospital day # 4  Renie Ora Stroke Neurology Team 07/31/2017 12:24 PM  I have personally examined this patient, reviewed notes, independently viewed imaging studies, participated in medical decision making and plan of care.ROS completed by me personally and pertinent positives fully documented  I have made any additions or clarifications directly to the above note. Agree with note above.    Antony Contras, MD Medical Director [facility] Pager: [phone] 07/31/2017 1:25 PM  Neurology to sign off at this time. Please call with any further questions or concerns. Thank you for this consultation  To contact Stroke Continuity provider, please refer to [email or web address] After hours, contact General Neurology

## 2017-07-31 NOTE — Interval H&P Note (Signed)
History and Physical Interval Note:  07/31/2017 4:10 PM  [name]  has presented today for surgery, with the diagnosis of stroke  The various methods of treatment have been discussed with the patient and family. After consideration of risks, benefits and other options for treatment, the patient has consented to  Procedure(s): LOOP RECORDER INSERTION (N/A) as a surgical intervention .  The patient's history has been reviewed, patient examined, no change in status, stable for surgery.  I have reviewed the patient's chart and labs.  Questions were answered to the patient's satisfaction.     Jared [name]

## 2017-07-31 NOTE — Progress Notes (Signed)
STROKE TEAM PROGRESS NOTE   SUBJECTIVE (INTERVAL HISTORY) No family at the bedside.  He has no complaints No new events reported overnight. Patient is anxious to go home. TEE scheduled for 3PM today. Has been walking with PT in hallways.    OBJECTIVE Recent Labs  Lab 07/26/17 1949 07/26/17 2042 07/31/17 1113  GLUCAP 103* 89 90   Recent Labs  Lab 07/26/17 1941  07/27/17 0519 07/28/17 0337 07/29/17 0442 07/30/17 0355 07/31/17 0529  NA  --    < > 140 137 139 138 142  K  --    < > 3.2* 3.5 3.9 4.3 3.9  CL  --    < > 106 103 106 104 106  CO2  --    < > 26 28 28 28 31   GLUCOSE  --    < > 135* 115* 90 103* 101*  BUN  --    < > 19 18 20  24* 20  CREATININE  --    < > 0.95 1.01 1.01 1.11 0.99  CALCIUM  --    < > 8.8* 9.2 9.0 9.1 9.0  MG 1.7  --   --  1.6* 1.9  --   --    < > = values in this interval not displayed.   Recent Labs  Lab 07/26/17 1947 07/28/17 0337  AST 28 30  ALT 32 31  ALKPHOS 83 78  BILITOT 0.6 1.0  PROT 7.1 6.5  ALBUMIN 3.9 3.4*   Recent Labs  Lab 07/26/17 1947 07/27/17 0519 07/28/17 0337  WBC 7.4 5.7 8.8  NEUTROABS 4.6 2.9 5.6  HGB 14.9 13.5 14.0  HCT 41.9 38.7* 40.0  MCV 88.6 89.0 87.9  PLT 187 170 147*   Recent Labs  Lab 07/27/17 0016 07/27/17 0519 07/27/17 1254  TROPONINI <0.03 <0.03 <0.03   No results for input(s): LABPROT, INR in the last 72 hours. No results for input(s): COLORURINE, LABSPEC, [place], GLUCOSEU, HGBUR, BILIRUBINUR, KETONESUR, PROTEINUR, UROBILINOGEN, NITRITE, LEUKOCYTESUR in the last 72 hours.  Invalid input(s): APPERANCEUR     Component Value Date/Time   CHOL 162 07/27/2017 0519   TRIG 95 07/27/2017 0519   HDL 41 07/27/2017 0519   CHOLHDL 4.0 07/27/2017 0519   VLDL 19 07/27/2017 0519   LDLCALC 102 (H) 07/27/2017 0519   Lab Results  Component Value Date   HGBA1C 5.3 07/27/2017   No results found for: LABOPIA, COCAINSCRNUR, LABBENZ, AMPHETMU, THCU, LABBARB  No results for input(s): ETH in the last 168  hours.  IMAGING: I have personally reviewed the radiological images below and agree with the radiology interpretations.  Ct Angio Head & Neck W Or Wo Contrast Result Date: 07/28/2017 IMPRESSION: No branch occlusion, stenosis, or visible embolic source. Atherosclerosis is mild for age. Electronically Signed   By: Monte Fantasia M.D.   On: 07/28/2017 09:33   Ct Head Wo Contrast Result Date: 07/26/2017 IMPRESSION: No acute intracranial abnormalities. Chronic atrophy and small vessel ischemic changes. Electronically Signed   By: Lucienne Capers M.D.   On: 07/26/2017 21:55   Mr Brain Wo Contrast Result Date: 07/27/2017 IMPRESSION: 1. Small acute infarct of the left caudate body. No hemorrhage or mass effect. 2. Chronic ischemic microangiopathy and generalized volume loss. Electronically Signed   By: Ulyses Jarred M.D.   On: 07/27/2017 03:21   ECHO 07/27/17 Study Conclusions - Left ventricle: The cavity size was normal. Wall thickness was   normal. Systolic function was moderately reduced. The estimated   ejection fraction  was in the range of 35% to 40%. Mild diffuse   hypokinesis with no identifiable regional variations. Features   are consistent with a pseudonormal left ventricular filling   pattern, with concomitant abnormal relaxation and increased   filling pressure (grade 2 diastolic dysfunction). - Mitral valve: There was mild regurgitation. - Left atrium: The atrium was mildly dilated. - Right atrium: The atrium was mildly dilated. - Pulmonary arteries: Systolic pressure was mildly increased. PA   peak pressure: 34 mm Hg (S).  Nuclear Stress Test  07/29/17  There was no ST segment deviation noted during stress.  Findings consistent with prior moderate to large mid to distal anterior and apical myocardial infarction with moderate peri-infarct ischemia. Small basal septal infarct.  The left ventricular ejection fraction is moderately decreased (30-44%).  This is a high risk  study. High risk based on decreased LVEF and moderate ischemia.  Nuclear stress EF: 32%.  PHYSICAL EXAM Temp:  [98 F (36.7 C)-98.4 F (36.9 C)] 98.4 F (36.9 C) (11/12 1000) Pulse Rate:  [46-65] 65 (11/12 1000) Resp:  [16-18] 18 (11/12 1000) BP: (142-157)/(68-81) 142/74 (11/12 1000) SpO2:  [96 %-99 %] 99 % (11/12 1000)  General -pleasant elderly Caucasian male, in no apparent distress Respiratory - Lungs clear bilaterally. No wheezing. Cardiovascular - Iregular rate and rhythm   Neurological Examination Mental Status: Alert, oriented, thought content appropriate.  Speech fluent without evidence of aphasia.  Some very mild word finding difficulties. Able to follow 3 step commands without difficulty. Cranial Nerves: II: Discs flat bilaterally; Visual fields grossly normal,  III,IV, VI: ptosis not present, extra-ocular motions intact bilaterally, pupils equal, round, reactive to light and accommodation V,VII: smile symmetric, facial light touch sensation normal bilaterally VIII: hearing normal bilaterally IX,X: uvula rises symmetrically, tremor-like movement noted in chin, patient has habit of clicking teeth continuously. When asked to stop the tremor like movement stopped. XI: bilateral shoulder shrug XII: midline tongue extension Motor: Right :  Upper extremity   5/5                                      Left:     Upper extremity   5/5             Lower extremity   5/5                                                  Lower extremity   5/5 Tone and bulk:normal tone throughout; no atrophy noted Sensory: Pinprick and light touch intact throughout, bilaterally Deep Tendon Reflexes: 3-4+ and symmetric throughout Plantars: Right: downgoing                                Left: downgoing Cerebellar: normal finger-to-nose, and normal heel-to-shin test Gait: Not tested   ASSESSMENT AND PLAN: [name] is a 76 y.o. male with PMH of HTN, OSA, BPH admitted for episode of dizziness  and fall at home on 07/26/17.  07/31/17:  Neuro exam remains stable. TEE and Loop scheduled for later today and then patient to be discharged home. POC discussed wit Dr Posey Pronto. Continue ASA and Liptor  Stroke: Acute Left caudate body Infarct. location consistent  with small vessel disease but also possible cardioembolic source with low EF 35-40% and frequent PACs and PVCs  Resultant Symptoms - All admission symptoms have resolved  MRI Head - Small acute infarct of the left caudate body.  CTA Head/Neck - No branch occlusion, stenosis, or visible embolic source  CT Head -No acute intracranial abnormalities  2D Echo - EF 35 - 40%, Grade 2 DD  TEE and Loop Recorder  - today at 3PM  LDL -  102  HgbA1c 5.3  VTE prophylaxis - Lovenox   Diet - Diet NPO time specified   No antithrombotic prior to admission, now on aspirin 325 mg daily.   Please discharge patient on  aspirin 325 mg daily  Patient counseled to be compliant with her antithrombotic medications  Ongoing aggressive stroke risk factor management  Therapy recommendations:  HH  Disposition: HOME  Follow up with [facility] Dr. Erlinda Hong in 6 weeks  Cardiomyopathy with EF 35-40%:  Cardiology consulted  Will do lexiscan for ischemic work up  TEE and loop today  Put on coreg and losartan  Frequent PVCs and PACs  Cardiology on board  Put on coreg   Will have TEE and loop as per cardiology  HYPERTENSION: Stable Permissive hypertension (OK if <220/120) for 24-48 hours post stroke and then gradually normalized within 5-7 days.  Long term BP goal normotensive.   May slowly restart home B/P medications after 48 hours with close PCP follow up.  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 Patient states it Dx when he was 76 years old.  Hypomagnesemia - replacement per primary team  Hospital day # 4  Renie Ora Stroke Neurology Team 07/31/2017 12:24 PM  I have personally examined this patient, reviewed notes, independently viewed imaging studies, participated in medical decision making and plan of care.ROS completed by me personally and pertinent positives fully documented  I have made any additions or clarifications directly to the above note. Agree with note above.    Antony Contras, MD Medical Director [facility] Pager: [phone] 07/31/2017 1:25 PM  Neurology to sign off at this time. Please call with any further questions or concerns. Thank you for this consultation  To contact Stroke Continuity provider, please refer to [email or web address] After hours, contact General Neurology

## 2017-07-31 NOTE — H&P (View-Only) (Signed)
ELECTROPHYSIOLOGY CONSULT NOTE  Patient ID: Jared [name] MRN: [id number], DOB/AGE: [birth year]   Admit date: 07/26/2017 Date of Consult: 07/31/2017  Primary Physician: Marletta Lor, MD Primary Cardiologist: new to [facility] Reason for Consultation: Cryptogenic stroke ; recommendations regarding Implantable Loop Recorder by Dr. Leonie Man  History of Present Illness Jared [name] was admitted on 07/26/2017 with new onset balance issues.   PMHx includes HTN, anxiety.   He first developed symptoms while mowing his yard.  Imaging demonstrated Acute Left caudate body Infarct. location consistent with small vessel disease but also possible cardioembolic source with low EF 35-40% and frequent PACs and PVCs   he has undergone workup for stroke including echocardiogram and carotid angio.  The patient has been monitored on telemetry which has demonstrated sinus rhythm with no arrhythmias.  Inpatient stroke work-up is to be completed with a TEE.   Echocardiogram this admission demonstrated   Study Conclusions  - Left ventricle: The cavity size was normal. Wall thickness was   normal. Systolic function was moderately reduced. The estimated   ejection fraction was in the range of 35% to 40%. Mild diffuse   hypokinesis with no identifiable regional variations. Features   are consistent with a pseudonormal left ventricular filling   pattern, with concomitant abnormal relaxation and increased   filling pressure (grade 2 diastolic dysfunction). - Mitral valve: There was mild regurgitation. - Left atrium: The atrium was mildly dilated. - Right atrium: The atrium was mildly dilated. - Pulmonary arteries: Systolic pressure was mildly increased. PA   peak pressure: 34 mm Hg (S).  Lab work is reviewed.  Prior to admission, the patient denies chest pain, shortness of breath, dizziness, he mentions rare palpitations, no syncope.  They are recovering from their stroke with plans to home at discharge (planned  for today).  EP has been asked to evaluate for placement of an implantable loop recorder to monitor for atrial fibrillation.     Past Medical History:  Diagnosis Date  . ALLERGIC RHINITIS 10/05/2007  . ANXIETY 10/05/2007  . BENIGN PROSTATIC HYPERTROPHY 04/08/2009  . COLONIC POLYPS, HX OF 10/05/2007  . DEPRESSION 10/05/2007  . GERD 10/05/2007  . Heart murmur   . HYPERTENSION 10/05/2007  . NEPHROLITHIASIS, HX OF 10/05/2007  . SLEEP APNEA, OBSTRUCTIVE 10/02/2008     Surgical History:  Past Surgical History:  Procedure Laterality Date  . APPENDECTOMY    . CATARACT EXTRACTION  2014   bilateral  . CHOLECYSTECTOMY    . FOOT SURGERY     left  . HERNIA REPAIR  2014  . WRIST FRACTURE SURGERY     right x2   left x1     Medications Prior to Admission  Medication Sig Dispense Refill Last Dose  . ALPRAZolam (XANAX) 0.5 MG tablet Take 0.5 mg 3 (three) times daily by mouth.    07/26/2017 at Unknown time  . amLODipine (NORVASC) 10 MG tablet TAKE 1 TABLET BY MOUTH EVERY DAY (Patient taking differently: TAKE 10 mg  TABLET BY MOUTH EVERY DAY) 90 tablet 0 07/26/2017 at Unknown time  . buPROPion (WELLBUTRIN SR) 150 MG 12 hr tablet Take 150 mg by mouth 2 (two) times daily.   07/26/2017 at Unknown time  . finasteride (PROSCAR) 5 MG tablet TAKE 1 TABLET (5 MG TOTAL) BY MOUTH DAILY. 90 tablet 1 07/26/2017 at Unknown time  . fluorometholone (FML) 0.1 % ophthalmic suspension Place 1 drop 3 (three) times daily into both eyes.  0 07/26/2017 at Unknown time  .  losartan-hydrochlorothiazide (HYZAAR) 100-25 MG tablet TAKE 1 TABLET BY MOUTH EVERY DAY (Patient taking differently: TAKE 25 mg TABLET BY MOUTH EVERY DAY) 90 tablet 0 07/26/2017 at Unknown time  . omeprazole (PRILOSEC) 10 MG capsule TAKE ONE CAPSULE BY MOUTH EVERY DAY (Patient taking differently: TAKE 10 mg CAPSULE BY MOUTH EVERY DAY) 90 capsule 3 07/26/2017 at Unknown time  . timolol (TIMOPTIC) 0.5 % ophthalmic solution Place 1 drop daily into both eyes.  0  07/25/2017 at Unknown time  . traZODone (DESYREL) 100 MG tablet Take 100 mg at bedtime by mouth.    07/25/2017 at Unknown time    Inpatient Medications:  . aspirin EC  325 mg Oral Daily  . atorvastatin  40 mg Oral q1800  . buPROPion  150 mg Oral BID  . carvedilol  6.25 mg Oral BID WC  . enoxaparin (LOVENOX) injection  40 mg Subcutaneous Q24H  . finasteride  5 mg Oral Daily  . fluorometholone  1 drop Both Eyes TID  . losartan  50 mg Oral Daily  . magnesium oxide  400 mg Oral BID  . pantoprazole  40 mg Oral Daily  . timolol  1 drop Both Eyes Daily  . traZODone  100 mg Oral QHS    Allergies: No Known Allergies  Social History   Socioeconomic History  . Marital status: Widowed    Spouse name: Not on file  . Number of children: Not on file  . Years of education: Not on file  . Highest education level: Not on file  Social Needs  . Financial resource strain: Not on file  . Food insecurity - worry: Not on file  . Food insecurity - inability: Not on file  . Transportation needs - medical: Not on file  . Transportation needs - non-medical: Not on file  Occupational History  . Not on file  Tobacco Use  . Smoking status: Never Smoker  . Smokeless tobacco: Never Used  Substance and Sexual Activity  . Alcohol use: No  . Drug use: No  . Sexual activity: Not on file  Other Topics Concern  . Not on file  Social History Narrative  . Not on file     Family History  Problem Relation Age of Onset  . Colon cancer Maternal Aunt 70      Review of Systems: All other systems reviewed and are otherwise negative except as noted above.  Physical Exam: Vitals:   07/30/17 2118 07/31/17 0050 07/31/17 0443 07/31/17 1000  BP: (!) 157/81 (!) 153/79 (!) 155/68 (!) 142/74  Pulse: (!) 51 (!) 52 (!) 51 65  Resp: 18 18 18 18   Temp: 98.3 F (36.8 C) 98.1 F (36.7 C) 98.3 F (36.8 C) 98.4 F (36.9 C)  TempSrc: Oral Oral Oral Oral  SpO2: 96% 97% 96% 99%  Weight:      Height:         GEN- The patient is well appearing, alert and oriented x 3 today.   Head- normocephalic, atraumatic Eyes-  Sclera clear, conjunctiva pink Ears- hearing intact Oropharynx- clear Neck- supple Lungs-  CTA b/l, normal work of breathing Heart- RRR, no murmurs, rubs or gallops  GI- soft, NT, ND Extremities- no clubbing, cyanosis, or edema MS- no significant deformity or atrophy Skin- no rash or lesion Psych- euthymic mood, full affect   Labs:   Lab Results  Component Value Date   WBC 8.8 07/28/2017   HGB 14.0 07/28/2017   HCT 40.0 07/28/2017   MCV 87.9  07/28/2017   PLT 147 (L) 07/28/2017    Recent Labs  Lab 07/28/17 0337  07/31/17 0529  NA 137   < > 142  K 3.5   < > 3.9  CL 103   < > 106  CO2 28   < > 31  BUN 18   < > 20  CREATININE 1.01   < > 0.99  CALCIUM 9.2   < > 9.0  PROT 6.5  --   --   BILITOT 1.0  --   --   ALKPHOS 78  --   --   ALT 31  --   --   AST 30  --   --   GLUCOSE 115*   < > 101*   < > = values in this interval not displayed.   Lab Results  Component Value Date   TROPONINI <0.03 07/27/2017   Lab Results  Component Value Date   CHOL 162 07/27/2017   CHOL 155 09/30/2016   CHOL 217 (H) 08/04/2014   Lab Results  Component Value Date   HDL 41 07/27/2017   HDL 44.20 09/30/2016   HDL 35.50 (L) 08/04/2014   Lab Results  Component Value Date   LDLCALC 102 (H) 07/27/2017   LDLCALC 91 09/30/2016   LDLCALC 156 (H) 08/04/2014   Lab Results  Component Value Date   TRIG 95 07/27/2017   TRIG 98.0 09/30/2016   TRIG 126.0 08/04/2014   Lab Results  Component Value Date   CHOLHDL 4.0 07/27/2017   CHOLHDL 3 09/30/2016   CHOLHDL 6 08/04/2014   Lab Results  Component Value Date   LDLDIRECT 96.6 12/04/2012    No results found for: DDIMER   Radiology/Studies:  Ct Angio Head W Or Wo Contrast Result Date: 07/28/2017 CLINICAL DATA:  Stroke follow-up. EXAM: CT ANGIOGRAPHY HEAD AND NECK TECHNIQUE: Multidetector CT imaging of the head and neck was  performed using the standard protocol during bolus administration of intravenous contrast. Multiplanar CT image reconstructions and MIPs were obtained to evaluate the vascular anatomy. Carotid stenosis measurements (when applicable) are obtained utilizing NASCET criteria, using the distal internal carotid diameter as the denominator. CONTRAST:  76mL ISOVUE-370 IOPAMIDOL (ISOVUE-370) INJECTION 76% COMPARISON:  Head CT from 2 days prior.  Brain MRI from yesterday. FINDINGS: CT HEAD FINDINGS Brain: Known acute small vessel infarct along the left corona radiata and caudate body. No evidence of progression or hemorrhagic conversion. Chronic small vessel ischemia and atrophy with ventriculomegaly. Vascular: See below Skull: No acute or aggressive finding Sinuses: Moderate chronic sinusitis. Orbits: Bilateral cataract resection. Review of the MIP images confirms the above findings CTA NECK FINDINGS Aortic arch: Atherosclerotic calcification. Aortic tortuosity without dilatation or stenosis. Right carotid system: No stenosis, ulceration, or dissection. No notable atheromatous changes. Left carotid system: Minimal atheromatous plaque at the ICA bulb. No stenosis or ulceration. Vertebral arteries: No proximal subclavian stenosis. Both vertebral arteries are smooth and widely patent to the dura. Skeleton: Spondylosis and degenerative facet spurring. 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 Upper chest: No acute or aggressive finding. Review of the MIP images confirms the above findings CTA HEAD FINDINGS Anterior circulation: Mild atherosclerotic plaque on the carotid siphons. Vessels are smooth and widely patent. Negative for aneurysm. Posterior circulation: Atherosclerotic calcification on the V4 segments. No stenosis or branch occlusion. Negative for aneurysm. Mild vertebrobasilar tortuosity. Venous sinuses: As permitted by contrast timing, patent. Anatomic variants: None unusual.  Delayed phase: No  abnormal intracranial enhancement. Review of the MIP images confirms the above findings IMPRESSION: No branch occlusion, stenosis, or visible embolic source. Atherosclerosis is mild for age. Electronically Signed   By: Monte Fantasia M.D.   On: 07/28/2017 09:33    Dg Chest 2 View Result Date: 07/27/2017 CLINICAL DATA:  Dizzy spells for several months. Syncopal episode today. Irregular heart rate. EXAM: CHEST  2 VIEW COMPARISON:  02/23/2014 FINDINGS: Mild cardiac enlargement. No edema or consolidation in the lungs. No blunting of costophrenic angles. No pneumothorax. Degenerative changes in the spine. Calcification of the aorta. IMPRESSION: Mild cardiac enlargement.  No evidence of active pulmonary disease. Electronically Signed   By: Lucienne Capers M.D.   On: 07/27/2017 01:54   Ct Head Wo Contrast Result Date: 07/26/2017 CLINICAL DATA:  Intermittent dizziness for few months. Dizziness today. EXAM: CT HEAD WITHOUT CONTRAST TECHNIQUE: Contiguous axial images were obtained from the base of the skull through the vertex without intravenous contrast. COMPARISON:  MRI brain 08/12/2009 FINDINGS: Brain: Diffuse cerebral atrophy. Ventricular dilatation consistent with central atrophy. Low-attenuation changes in the deep white matter consistent with small vessel ischemia. No evidence of acute infarction, hemorrhage, hydrocephalus, extra-axial collection or mass lesion/mass effect. Vascular: Vertebrobasilar and internal carotid artery calcifications. Skull: The calvarium appears intact. Sinuses/Orbits: Mucosal thickening in the paranasal sinuses. No acute air-fluid levels. Mastoid air cells are not opacified. Other: None. IMPRESSION: No acute intracranial abnormalities. Chronic atrophy and small vessel ischemic changes. Electronically Signed   By: Lucienne Capers M.D.   On: 07/26/2017 21:55    Mr Brain Wo Contrast Result Date: 07/27/2017 CLINICAL DATA:  Ataxia and possible stroke. EXAM: MRI HEAD  WITHOUT CONTRAST TECHNIQUE: Multiplanar, multiecho pulse sequences of the brain and surrounding structures were obtained without intravenous contrast. COMPARISON:  Head CT 07/26/2017 Brain MRI 08/12/2009 FINDINGS: Brain: The midline structures are normal. There is an acute infarct within the left caudate body measuring approximately 10 x 8 mm. No mass lesion, hydrocephalus, dural abnormality or extra-axial collection. There is multifocal white matter hyperintensity suggesting chronic ischemic microangiopathy. Old right basal ganglia lacunar infarct. Generalized volume loss without lobar predominance. Foci of chronic microhemorrhage in the brainstem and both thalami. Vascular: Major intracranial arterial and venous sinus flow voids are preserved. Skull and upper cervical spine: The visualized skull base, calvarium, upper cervical spine and extracranial soft tissues are normal. Sinuses/Orbits: Moderate maxillary mucosal thickening. No fluid levels. Normal orbits. IMPRESSION: 1. Small acute infarct of the left caudate body. No hemorrhage or mass effect. 2. Chronic ischemic microangiopathy and generalized volume loss. Electronically Signed   By: Ulyses Jarred M.D.   On: 07/27/2017 03:21    Nm Myocar Multi W/spect W/wall Motion / Ef Result Date: 07/29/2017  There was no ST segment deviation noted during stress.  Findings consistent with prior moderate to large mid to distal anterior and apical myocardial infarction with moderate peri-infarct ischemia. Small basal septal infarct.  The left ventricular ejection fraction is moderately decreased (30-44%).  This is a high risk study. High risk based on decreased LVEF and moderate ischemia.  Nuclear stress EF: 32%.     12-lead ECG SR, PACs, PVC, no AF All prior EKG's in EPIC reviewed with no documented atrial fibrillation  Telemetry SR, occ-frequent PACs, occ PVCs, rare couplets, NSVT 3-6 beats  Assessment and Plan:  1. Cryptogenic stroke The patient  presents with cryptogenic stroke.  The patient has a TEE planned for this afternoon.  I spoke at length with the patient about monitoring  for afib with either a 30 day event monitor or an implantable loop recorder.  Risks, benefits, and alteratives to implantable loop recorder were discussed with the patient today.   At this time, the patient is uncertain decision to proceed with implantable loop recorder vs event monitoring   Wound care was reviewed with the patient (keep incision clean and dry for 3 days).  Wound check will be scheduled for the patient should he decide to pursue implant.  Please call with questions.   Called that the patient has decided to persue loop implant, signed consent, will proceed pending TEE findings  Baldwin Jamaica, PA-C 07/31/2017  EP Attending  Patient seen and examined. Agree with the findings as noted above. The patient has undergone TEE with no obvious explanation for his stroke. I have reviewed the indications/risks/benefits/goals/expectations of insertion of an ILR and he wishes to proceed.  Mikle Bosworth.D.

## 2017-07-31 NOTE — Progress Notes (Signed)
D/C instructions provided to patient and family, denies questions/concerns at this time. Patient taken to front entrance via [facility], refused escort by this RN and on-coming RN.

## 2017-07-31 NOTE — Consult Note (Signed)
ELECTROPHYSIOLOGY CONSULT NOTE  Patient ID: Jared [name] MRN: [id number], DOB/AGE: 74-26-1942   Admit date: 07/26/2017 Date of Consult: 07/31/2017  Primary Physician: Marletta Lor, MD Primary Cardiologist: new to [facility] Reason for Consultation: Cryptogenic stroke ; recommendations regarding Implantable Loop Recorder by Dr. Leonie Man  History of Present Illness Jared [name] was admitted on 07/26/2017 with new onset balance issues.   PMHx includes HTN, anxiety.   He first developed symptoms while mowing his yard.  Imaging demonstrated Acute Left caudate body Infarct. location consistent with small vessel disease but also possible cardioembolic source with low EF 35-40% and frequent PACs and PVCs   he has undergone workup for stroke including echocardiogram and carotid angio.  The patient has been monitored on telemetry which has demonstrated sinus rhythm with no arrhythmias.  Inpatient stroke work-up is to be completed with a TEE.   Echocardiogram this admission demonstrated   Study Conclusions  - Left ventricle: The cavity size was normal. Wall thickness was   normal. Systolic function was moderately reduced. The estimated   ejection fraction was in the range of 35% to 40%. Mild diffuse   hypokinesis with no identifiable regional variations. Features   are consistent with a pseudonormal left ventricular filling   pattern, with concomitant abnormal relaxation and increased   filling pressure (grade 2 diastolic dysfunction). - Mitral valve: There was mild regurgitation. - Left atrium: The atrium was mildly dilated. - Right atrium: The atrium was mildly dilated. - Pulmonary arteries: Systolic pressure was mildly increased. PA   peak pressure: 34 mm Hg (S).  Lab work is reviewed.  Prior to admission, the patient denies chest pain, shortness of breath, dizziness, he mentions rare palpitations, no syncope.  They are recovering from their stroke with plans to home at discharge (planned  for today).  EP has been asked to evaluate for placement of an implantable loop recorder to monitor for atrial fibrillation.     Past Medical History:  Diagnosis Date  . ALLERGIC RHINITIS 10/05/2007  . ANXIETY 10/05/2007  . BENIGN PROSTATIC HYPERTROPHY 04/08/2009  . COLONIC POLYPS, HX OF 10/05/2007  . DEPRESSION 10/05/2007  . GERD 10/05/2007  . Heart murmur   . HYPERTENSION 10/05/2007  . NEPHROLITHIASIS, HX OF 10/05/2007  . SLEEP APNEA, OBSTRUCTIVE 10/02/2008     Surgical History:  Past Surgical History:  Procedure Laterality Date  . APPENDECTOMY    . CATARACT EXTRACTION  2014   bilateral  . CHOLECYSTECTOMY    . FOOT SURGERY     left  . HERNIA REPAIR  2014  . WRIST FRACTURE SURGERY     right x2   left x1     Medications Prior to Admission  Medication Sig Dispense Refill Last Dose  . ALPRAZolam (XANAX) 0.5 MG tablet Take 0.5 mg 3 (three) times daily by mouth.    07/26/2017 at Unknown time  . amLODipine (NORVASC) 10 MG tablet TAKE 1 TABLET BY MOUTH EVERY DAY (Patient taking differently: TAKE 10 mg  TABLET BY MOUTH EVERY DAY) 90 tablet 0 07/26/2017 at Unknown time  . buPROPion (WELLBUTRIN SR) 150 MG 12 hr tablet Take 150 mg by mouth 2 (two) times daily.   07/26/2017 at Unknown time  . finasteride (PROSCAR) 5 MG tablet TAKE 1 TABLET (5 MG TOTAL) BY MOUTH DAILY. 90 tablet 1 07/26/2017 at Unknown time  . fluorometholone (FML) 0.1 % ophthalmic suspension Place 1 drop 3 (three) times daily into both eyes.  0 07/26/2017 at Unknown time  .  losartan-hydrochlorothiazide (HYZAAR) 100-25 MG tablet TAKE 1 TABLET BY MOUTH EVERY DAY (Patient taking differently: TAKE 25 mg TABLET BY MOUTH EVERY DAY) 90 tablet 0 07/26/2017 at Unknown time  . omeprazole (PRILOSEC) 10 MG capsule TAKE ONE CAPSULE BY MOUTH EVERY DAY (Patient taking differently: TAKE 10 mg CAPSULE BY MOUTH EVERY DAY) 90 capsule 3 07/26/2017 at Unknown time  . timolol (TIMOPTIC) 0.5 % ophthalmic solution Place 1 drop daily into both eyes.  0  07/25/2017 at Unknown time  . traZODone (DESYREL) 100 MG tablet Take 100 mg at bedtime by mouth.    07/25/2017 at Unknown time    Inpatient Medications:  . aspirin EC  325 mg Oral Daily  . atorvastatin  40 mg Oral q1800  . buPROPion  150 mg Oral BID  . carvedilol  6.25 mg Oral BID WC  . enoxaparin (LOVENOX) injection  40 mg Subcutaneous Q24H  . finasteride  5 mg Oral Daily  . fluorometholone  1 drop Both Eyes TID  . losartan  50 mg Oral Daily  . magnesium oxide  400 mg Oral BID  . pantoprazole  40 mg Oral Daily  . timolol  1 drop Both Eyes Daily  . traZODone  100 mg Oral QHS    Allergies: No Known Allergies  Social History   Socioeconomic History  . Marital status: Widowed    Spouse name: Not on file  . Number of children: Not on file  . Years of education: Not on file  . Highest education level: Not on file  Social Needs  . Financial resource strain: Not on file  . Food insecurity - worry: Not on file  . Food insecurity - inability: Not on file  . Transportation needs - medical: Not on file  . Transportation needs - non-medical: Not on file  Occupational History  . Not on file  Tobacco Use  . Smoking status: Never Smoker  . Smokeless tobacco: Never Used  Substance and Sexual Activity  . Alcohol use: No  . Drug use: No  . Sexual activity: Not on file  Other Topics Concern  . Not on file  Social History Narrative  . Not on file     Family History  Problem Relation Age of Onset  . Colon cancer Maternal Aunt 70      Review of Systems: All other systems reviewed and are otherwise negative except as noted above.  Physical Exam: Vitals:   07/30/17 2118 07/31/17 0050 07/31/17 0443 07/31/17 1000  BP: (!) 157/81 (!) 153/79 (!) 155/68 (!) 142/74  Pulse: (!) 51 (!) 52 (!) 51 65  Resp: 18 18 18 18   Temp: 98.3 F (36.8 C) 98.1 F (36.7 C) 98.3 F (36.8 C) 98.4 F (36.9 C)  TempSrc: Oral Oral Oral Oral  SpO2: 96% 97% 96% 99%  Weight:      Height:         GEN- The patient is well appearing, alert and oriented x 3 today.   Head- normocephalic, atraumatic Eyes-  Sclera clear, conjunctiva pink Ears- hearing intact Oropharynx- clear Neck- supple Lungs-  CTA b/l, normal work of breathing Heart- RRR, no murmurs, rubs or gallops  GI- soft, NT, ND Extremities- no clubbing, cyanosis, or edema MS- no significant deformity or atrophy Skin- no rash or lesion Psych- euthymic mood, full affect   Labs:   Lab Results  Component Value Date   WBC 8.8 07/28/2017   HGB 14.0 07/28/2017   HCT 40.0 07/28/2017   MCV 87.9  07/28/2017   PLT 147 (L) 07/28/2017    Recent Labs  Lab 07/28/17 0337  07/31/17 0529  NA 137   < > 142  K 3.5   < > 3.9  CL 103   < > 106  CO2 28   < > 31  BUN 18   < > 20  CREATININE 1.01   < > 0.99  CALCIUM 9.2   < > 9.0  PROT 6.5  --   --   BILITOT 1.0  --   --   ALKPHOS 78  --   --   ALT 31  --   --   AST 30  --   --   GLUCOSE 115*   < > 101*   < > = values in this interval not displayed.   Lab Results  Component Value Date   TROPONINI <0.03 07/27/2017   Lab Results  Component Value Date   CHOL 162 07/27/2017   CHOL 155 09/30/2016   CHOL 217 (H) 08/04/2014   Lab Results  Component Value Date   HDL 41 07/27/2017   HDL 44.20 09/30/2016   HDL 35.50 (L) 08/04/2014   Lab Results  Component Value Date   LDLCALC 102 (H) 07/27/2017   LDLCALC 91 09/30/2016   LDLCALC 156 (H) 08/04/2014   Lab Results  Component Value Date   TRIG 95 07/27/2017   TRIG 98.0 09/30/2016   TRIG 126.0 08/04/2014   Lab Results  Component Value Date   CHOLHDL 4.0 07/27/2017   CHOLHDL 3 09/30/2016   CHOLHDL 6 08/04/2014   Lab Results  Component Value Date   LDLDIRECT 96.6 12/04/2012    No results found for: DDIMER   Radiology/Studies:  Ct Angio Head W Or Wo Contrast Result Date: 07/28/2017 CLINICAL DATA:  Stroke follow-up. EXAM: CT ANGIOGRAPHY HEAD AND NECK TECHNIQUE: Multidetector CT imaging of the head and neck was  performed using the standard protocol during bolus administration of intravenous contrast. Multiplanar CT image reconstructions and MIPs were obtained to evaluate the vascular anatomy. Carotid stenosis measurements (when applicable) are obtained utilizing NASCET criteria, using the distal internal carotid diameter as the denominator. CONTRAST:  38mL ISOVUE-370 IOPAMIDOL (ISOVUE-370) INJECTION 76% COMPARISON:  Head CT from 2 days prior.  Brain MRI from yesterday. FINDINGS: CT HEAD FINDINGS Brain: Known acute small vessel infarct along the left corona radiata and caudate body. No evidence of progression or hemorrhagic conversion. Chronic small vessel ischemia and atrophy with ventriculomegaly. Vascular: See below Skull: No acute or aggressive finding Sinuses: Moderate chronic sinusitis. Orbits: Bilateral cataract resection. Review of the MIP images confirms the above findings CTA NECK FINDINGS Aortic arch: Atherosclerotic calcification. Aortic tortuosity without dilatation or stenosis. Right carotid system: No stenosis, ulceration, or dissection. No notable atheromatous changes. Left carotid system: Minimal atheromatous plaque at the ICA bulb. No stenosis or ulceration. Vertebral arteries: No proximal subclavian stenosis. Both vertebral arteries are smooth and widely patent to the dura. Skeleton: Spondylosis and degenerative facet spurring. 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 Upper chest: No acute or aggressive finding. Review of the MIP images confirms the above findings CTA HEAD FINDINGS Anterior circulation: Mild atherosclerotic plaque on the carotid siphons. Vessels are smooth and widely patent. Negative for aneurysm. Posterior circulation: Atherosclerotic calcification on the V4 segments. No stenosis or branch occlusion. Negative for aneurysm. Mild vertebrobasilar tortuosity. Venous sinuses: As permitted by contrast timing, patent. Anatomic variants: None unusual.  Delayed phase: No  abnormal intracranial enhancement. Review of the MIP images confirms the above findings IMPRESSION: No branch occlusion, stenosis, or visible embolic source. Atherosclerosis is mild for age. Electronically Signed   By: Monte Fantasia M.D.   On: 07/28/2017 09:33    Dg Chest 2 View Result Date: 07/27/2017 CLINICAL DATA:  Dizzy spells for several months. Syncopal episode today. Irregular heart rate. EXAM: CHEST  2 VIEW COMPARISON:  02/23/2014 FINDINGS: Mild cardiac enlargement. No edema or consolidation in the lungs. No blunting of costophrenic angles. No pneumothorax. Degenerative changes in the spine. Calcification of the aorta. IMPRESSION: Mild cardiac enlargement.  No evidence of active pulmonary disease. Electronically Signed   By: Lucienne Capers M.D.   On: 07/27/2017 01:54   Ct Head Wo Contrast Result Date: 07/26/2017 CLINICAL DATA:  Intermittent dizziness for few months. Dizziness today. EXAM: CT HEAD WITHOUT CONTRAST TECHNIQUE: Contiguous axial images were obtained from the base of the skull through the vertex without intravenous contrast. COMPARISON:  MRI brain 08/12/2009 FINDINGS: Brain: Diffuse cerebral atrophy. Ventricular dilatation consistent with central atrophy. Low-attenuation changes in the deep white matter consistent with small vessel ischemia. No evidence of acute infarction, hemorrhage, hydrocephalus, extra-axial collection or mass lesion/mass effect. Vascular: Vertebrobasilar and internal carotid artery calcifications. Skull: The calvarium appears intact. Sinuses/Orbits: Mucosal thickening in the paranasal sinuses. No acute air-fluid levels. Mastoid air cells are not opacified. Other: None. IMPRESSION: No acute intracranial abnormalities. Chronic atrophy and small vessel ischemic changes. Electronically Signed   By: Lucienne Capers M.D.   On: 07/26/2017 21:55    Mr Brain Wo Contrast Result Date: 07/27/2017 CLINICAL DATA:  Ataxia and possible stroke. EXAM: MRI HEAD  WITHOUT CONTRAST TECHNIQUE: Multiplanar, multiecho pulse sequences of the brain and surrounding structures were obtained without intravenous contrast. COMPARISON:  Head CT 07/26/2017 Brain MRI 08/12/2009 FINDINGS: Brain: The midline structures are normal. There is an acute infarct within the left caudate body measuring approximately 10 x 8 mm. No mass lesion, hydrocephalus, dural abnormality or extra-axial collection. There is multifocal white matter hyperintensity suggesting chronic ischemic microangiopathy. Old right basal ganglia lacunar infarct. Generalized volume loss without lobar predominance. Foci of chronic microhemorrhage in the brainstem and both thalami. Vascular: Major intracranial arterial and venous sinus flow voids are preserved. Skull and upper cervical spine: The visualized skull base, calvarium, upper cervical spine and extracranial soft tissues are normal. Sinuses/Orbits: Moderate maxillary mucosal thickening. No fluid levels. Normal orbits. IMPRESSION: 1. Small acute infarct of the left caudate body. No hemorrhage or mass effect. 2. Chronic ischemic microangiopathy and generalized volume loss. Electronically Signed   By: Ulyses Jarred M.D.   On: 07/27/2017 03:21    Nm Myocar Multi W/spect W/wall Motion / Ef Result Date: 07/29/2017  There was no ST segment deviation noted during stress.  Findings consistent with prior moderate to large mid to distal anterior and apical myocardial infarction with moderate peri-infarct ischemia. Small basal septal infarct.  The left ventricular ejection fraction is moderately decreased (30-44%).  This is a high risk study. High risk based on decreased LVEF and moderate ischemia.  Nuclear stress EF: 32%.     12-lead ECG SR, PACs, PVC, no AF All prior EKG's in EPIC reviewed with no documented atrial fibrillation  Telemetry SR, occ-frequent PACs, occ PVCs, rare couplets, NSVT 3-6 beats  Assessment and Plan:  1. Cryptogenic stroke The patient  presents with cryptogenic stroke.  The patient has a TEE planned for this afternoon.  I spoke at length with the patient about monitoring  for afib with either a 30 day event monitor or an implantable loop recorder.  Risks, benefits, and alteratives to implantable loop recorder were discussed with the patient today.   At this time, the patient is uncertain decision to proceed with implantable loop recorder vs event monitoring   Wound care was reviewed with the patient (keep incision clean and dry for 3 days).  Wound check will be scheduled for the patient should he decide to pursue implant.  Please call with questions.   Called that the patient has decided to persue loop implant, signed consent, will proceed pending TEE findings  Baldwin Jamaica, PA-C 07/31/2017  EP Attending  Patient seen and examined. Agree with the findings as noted above. The patient has undergone TEE with no obvious explanation for his stroke. I have reviewed the indications/risks/benefits/goals/expectations of insertion of an ILR and he wishes to proceed.  Mikle Bosworth.D.

## 2017-07-31 NOTE — Care Management Note (Signed)
Case Management Note  Patient Details  Name: Jared [name] MRN: [id number] Date of Birth: 1941-06-11  Subjective/Objective:                    Action/Plan: Pt discharging home with orders for [facility] services. CM met with the patient and provided him choice. He selected [facility]. Jermaine notified and accepted the referral.  Pt with orders for walker. Jermaine with [facility] delivered the equipment to the room.  Pt has transportation home.   Expected Discharge Date:  07/29/17               Expected Discharge Plan:  [place]  In-House Referral:     Discharge planning Services  CM Consult  Post Acute Care Choice:  Home Health, Durable Medical Equipment Choice offered to:  Patient  DME Arranged:  Walker rolling DME Agency:  [place] Arranged:  PT, OT [facility] Agency:  [place]  Status of Service:  Completed, signed off  If discussed at [place] of Stay Meetings, dates discussed:    Additional Comments:  Pollie Friar, RN 07/31/2017, 4:54 PM

## 2017-07-31 NOTE — Progress Notes (Signed)
Physical Therapy Treatment Patient Details Name: Jared [name] MRN: [id number] DOB: 1940/11/28 Today's Date: 07/31/2017    History of Present Illness Pt is a 76 y.o. male presented with c/o dizziness s/p mechanical fall while mowing the lawn. Pt reports that he has had dizzy episodes periodically over last 3 weeks. 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    PT Comments    Pt continues to improve his stability with gait, however in performance of Dynamic Gait Index test pt scored 17/24. Scores less than 19/24 are predictive of fall risk in community dwelling adults. As such PT continues to recommend HHPT at discharge to work on balance and gait. Pt requires skilled PT in the acute setting to progress mobility and improve strength and endurance to safely navigate their discharge environment.    Follow Up Recommendations  Home health PT;Supervision - Intermittent     Equipment Recommendations  Rolling walker with 5" wheels    Recommendations for Other Services OT consult     Precautions / Restrictions Precautions Precautions: Fall Precaution Comments: fallen out of tub x3 in last 3 months, fallen in yard x2  Restrictions Weight Bearing Restrictions: No    Mobility  Bed Mobility Overal bed mobility: Modified Independent             General bed mobility comments: OOB in recliner  Transfers Overall transfer level: Needs assistance Equipment used: None Transfers: Sit to/from Stand Sit to Stand: Modified independent (Device/Increase time)            Ambulation/Gait Ambulation/Gait assistance: Supervision Ambulation Distance (Feet): 600 Feet Assistive device: None Gait Pattern/deviations: Step-through pattern;Narrow base of support Gait velocity: WFL Gait velocity interpretation: >2.62 ft/sec, indicative of  independent community ambulator General Gait Details: Supervision, performed DGI, during ambulation afterward focused on maintaining wide Bos using visual cues    Stairs Stairs: Yes   Stair Management: Two rails;Alternating pattern Number of Stairs: 5    Wheelchair Mobility    Modified Rankin (Stroke Patients Only) Modified Rankin (Stroke Patients Only) Pre-Morbid Rankin Score: No symptoms Modified Rankin: No symptoms     Balance Overall balance assessment: Needs assistance         Standing balance support: No upper extremity supported;During functional activity Standing balance-Leahy Scale: Good Standing balance comment: does not require assist but would not withstand maximal challange to balance                 Standardized Balance Assessment Standardized Balance Assessment : Dynamic Gait Index   Dynamic Gait Index Level Surface: Normal Change in Gait Speed: Moderate Impairment Gait with Horizontal Head Turns: Mild Impairment Gait with Vertical Head Turns: Mild Impairment Gait and Pivot Turn: Normal Step Over Obstacle: Mild Impairment Step Around Obstacles: Mild Impairment Steps: Mild Impairment Total Score: 17      Cognition Arousal/Alertness: Awake/alert Behavior During Therapy: WFL for tasks assessed/performed Overall Cognitive Status: Within Functional Limits for tasks assessed                                               Pertinent Vitals/Pain Pain Assessment: No/denies pain           PT Goals (current goals can now be found in  the care plan section) Acute Rehab PT Goals Patient Stated Goal: go home PT Goal Formulation: With patient/family Time For Goal Achievement: 08/10/17 Potential to Achieve Goals: Good Progress towards PT goals: Progressing toward goals    Frequency    Min 4X/week      PT Plan Current plan remains appropriate       AM-PAC PT "6 Clicks" Daily Activity  Outcome Measure  Difficulty  turning over in bed (including adjusting bedclothes, sheets and blankets)?: A Little Difficulty moving from lying on back to sitting on the side of the bed? : A Little Difficulty sitting down on and standing up from a chair with arms (e.g., wheelchair, bedside commode, etc,.)?: None Help needed moving to and from a bed to chair (including a wheelchair)?: None Help needed walking in hospital room?: None Help needed climbing 3-5 steps with a railing? : None 6 Click Score: 22    End of Session Equipment Utilized During Treatment: Gait belt Activity Tolerance: Patient tolerated treatment well Patient left: in chair;with call bell/phone within reach Nurse Communication: Mobility status PT Visit Diagnosis: Unsteadiness on feet (R26.81);Other abnormalities of gait and mobility (R26.89);Repeated falls (R29.6);Muscle weakness (generalized) (M62.81);History of falling (Z91.81);Ataxic gait (R26.0);Difficulty in walking, not elsewhere classified (R26.2);Other symptoms and signs involving the nervous system (S06.301)     Time: 6010-9323 PT Time Calculation (min) (ACUTE ONLY): 18 min  Charges:  $Gait Training: 8-22 mins                    G Codes:       Karron Goens B. Migdalia Dk PT, DPT Acute Rehabilitation  [phone] Pager [phone]     [place] 07/31/2017, 10:03 AM

## 2017-08-01 ENCOUNTER — Telehealth: Payer: Self-pay | Admitting: Internal Medicine

## 2017-08-01 ENCOUNTER — Encounter (HOSPITAL_COMMUNITY): Payer: Self-pay | Admitting: Internal Medicine

## 2017-08-01 ENCOUNTER — Telehealth: Payer: Self-pay | Admitting: *Deleted

## 2017-08-01 ENCOUNTER — Telehealth: Payer: Self-pay

## 2017-08-01 DIAGNOSIS — G4733 Obstructive sleep apnea (adult) (pediatric): Secondary | ICD-10-CM | POA: Diagnosis not present

## 2017-08-01 DIAGNOSIS — N400 Enlarged prostate without lower urinary tract symptoms: Secondary | ICD-10-CM | POA: Diagnosis not present

## 2017-08-01 DIAGNOSIS — I10 Essential (primary) hypertension: Secondary | ICD-10-CM | POA: Diagnosis not present

## 2017-08-01 DIAGNOSIS — F419 Anxiety disorder, unspecified: Secondary | ICD-10-CM | POA: Diagnosis not present

## 2017-08-01 DIAGNOSIS — W01111D Fall on same level from slipping, tripping and stumbling with subsequent striking against power tool or machine, subsequent encounter: Secondary | ICD-10-CM | POA: Diagnosis not present

## 2017-08-01 DIAGNOSIS — K219 Gastro-esophageal reflux disease without esophagitis: Secondary | ICD-10-CM | POA: Diagnosis not present

## 2017-08-01 DIAGNOSIS — F329 Major depressive disorder, single episode, unspecified: Secondary | ICD-10-CM | POA: Diagnosis not present

## 2017-08-01 DIAGNOSIS — I69351 Hemiplegia and hemiparesis following cerebral infarction affecting right dominant side: Secondary | ICD-10-CM | POA: Diagnosis not present

## 2017-08-01 NOTE — Telephone Encounter (Signed)
Copied from [place] [id number]. Topic: General - Other >> Aug 01, 2017  1:31 PM Carolyn Stare wrote: Reason for CRM: Marzetta Board a PT  with [facility] saw pt today and notice that his heart rate was low  44 to 46

## 2017-08-01 NOTE — Telephone Encounter (Signed)
Transition Care Management Follow-up Telephone Call   Date discharged? 07/31/17   How have you been since you were released from the hospital? "A little weak and slow. I'm doing a little cleanup around the house right now and I've had breakfast and I'm taking my medication."   Do you understand why you were in the hospital? yes   Do you understand the discharge instructions? yes   Where were you discharged to? Home   Items Reviewed:  Medications reviewed: yes  Allergies reviewed: yes  Dietary changes reviewed: yes, heart healthy  Referrals reviewed: yes   Functional Questionnaire:   Activities of Daily Living (ADLs):   He states they are independent in the following: ambulation, bathing and hygiene, feeding, continence, grooming, toileting and dressing States they require assistance with the following: none   Any transportation issues/concerns?: no   Any patient concerns? no   Confirmed importance and date/time of follow-up visits scheduled yes  Provider Appointment booked with Dr. Bluford Kaufmann 08/07/17 @ 2:45pm  Confirmed with patient if condition begins to worsen call PCP or go to the ER.  Patient was given the office number and encouraged to call back with question or concerns.  : yes

## 2017-08-01 NOTE — Discharge Summary (Signed)
[facility] Discharge Summary   Patient: Jared [name] TLX:[id number]   PCP: Marletta Lor, MD DOB: [birth year]   Date of admission: 07/26/2017   Date of discharge: 07/31/2017     Discharge Diagnoses:  Principal Problem:   Dizziness Active Problems:   Anxiety state   Essential hypertension   BPH (benign prostatic hyperplasia)   Fall at home, initial encounter   Frequent PVCs   CVA (cerebral vascular accident) ([place])   Ischemic cardiomyopathy   Hyperlipidemia   Admitted From: home Disposition:  Home with home health  Recommendations for Outpatient Follow-up:  1. Please follow up with cardiology and neurlogy as recommended  2. Follow up with PCP in 1 week   Follow-up Information    Rosalin Hawking, MD. Schedule an appointment as soon as possible for a visit in 6 week(s).   Specialty:  Neurology Contact information: [address] [place] [address] [phone]        [place] [facility] Follow up on 08/09/2017.   Specialty:  Cardiology Why:  4:00PM, wound check visit Contact information: [address], [address] [place]       Marletta Lor, MD. Schedule an appointment as soon as possible for a visit in 1 week(s).   Specialty:  Internal Medicine Contact information: [address] [place] [address] [phone]          Diet recommendation: cardiac diet  Activity: The patient is advised to gradually reintroduce usual activities.  Discharge Condition: good  Code Status: full code  History of present illness: As per the H and P dictated on admission, "Jared [name] is a 76 y.o. male with medical history significant of HTN, heart murmur, anxiety, and BPH; who presents with complaints of dizziness.  History is obtained from the patient.  He reports being under a lot of social stressors recently.  It is not quite clear how long symptoms have occurred.  In the last  few days he notes feeling more dizzy/ off balance more frequently.  Notes that he has been running into the corners of doors in his home.  Does not feel that he lightheaded, going to pass out, or that the room was spinning around him.  While mowing the lawn this afternoon he reported feeling as though the lawn mower was moving faster than he was able to although it was not self-propelled.  He does admit that symptoms are worsened with getting up and moving too quickly.  Due to symptoms patient reports  going to put the mower back up and lost his balance falling over the mower.  Patient denies any loss of consciousness although there were reports of this initially.  He did not sustain any significant trauma to his head.  Associated symptoms include reports of palpitations, urinary urgency that he relates to his prostate.  Denies any recent medication changes, focal weakness, slurred speech, change of vision, ear pain, fever, chills, headache, change in vision, nausea, vomiting, diarrhea, cough, or shortness of breath.  ED Course: Upon admission into the emergency room patient was found to be afebrile, pulse 51-131, respirations 12-22, blood pressure 135/70-156/94, O2 saturations maintained on room air.  Orthostatic vital signs were unremarkable.  Labs relatively within normal limits.  CT scan of the brain showed no acute abnormalities.  Cardiology was consulted regarding EKG findings and suggested APCs and VPCs.  TR[facility] called to admit.  "  Hospital Course:  Summary of his  active problems in the hospital is as following.  1.  Acute left caudate body CVA. Likely small vessel disease but possibility of embolus cannot be ruled out. Echocardiogram shows 35-40% EF. LDL 102 on Lipitor 40 mg. On aspirin 325 mg as well. PT OT recommends home with home health. CTA head and neck shows no acute stenosis or obstruction. Symptoms are improving right now. Hemoglobin A1c 5.3. TEE to rule out any embolus Underwent  loop recorder placement  2.  Acute cardiomyopathy, systolic dysfunction without any CHF. Echocardiogram shows EF of 46-96%, grade 2 diastolic dysfunction. No specific mention about wall motion abnormality. Patient did not have any chest pain or any ACS symptoms in the past. Cardiology consulted, NM stress test high risk-although probably old anterior infarct.  Will require invasive workup once recovered from acute CVA.  Patient will be started on Coreg as well as losartan.  3.  Essential hypertension. Patient will be started on Coreg and losartan per cardiology. Monitor blood pressure.  4.  Chronic neck lesion. Simple appearing lipoma based on the CT scan. No further workup recommended at present.  5.  Hypokalemia and hypomagnesemia. Stable, monitor  All other chronic medical condition were stable during the hospitalization.  Patient was seen by physical therapy, who recommended home health, which was arranged by Education officer, museum and case Freight forwarder. On the day of the discharge the patient's vitals were stable, and no other acute medical condition were reported by patient. the patient was felt safe to be discharge at home with home health.  Procedures and Results:  Echocardiogram  TEE  Loop recorder placement  NM stress test   Consultations:  Neurology  Cardiology     DISCHARGE MEDICATION: Discharge Medication List as of 07/31/2017  6:20 PM    START taking these medications   Details  aspirin EC 325 MG EC tablet Take 1 tablet (325 mg total) daily by mouth., Starting Tue 08/01/2017, Normal    atorvastatin (LIPITOR) 40 MG tablet Take 1 tablet (40 mg total) daily at 6 PM by mouth., Starting Mon 07/31/2017, Normal    carvedilol (COREG) 6.25 MG tablet Take 1 tablet (6.25 mg total) 2 (two) times daily with a meal by mouth., Starting Mon 07/31/2017, Normal    losartan (COZAAR) 50 MG tablet Take 1 tablet (50 mg total) daily by mouth., Starting Tue 08/01/2017, Normal       CONTINUE these medications which have NOT CHANGED   Details  ALPRAZolam (XANAX) 0.5 MG tablet Take 0.5 mg 3 (three) times daily by mouth. , Starting Mon 10/17/2011, Historical Med    amLODipine (NORVASC) 10 MG tablet TAKE 1 TABLET BY MOUTH EVERY DAY, Normal    buPROPion (WELLBUTRIN SR) 150 MG 12 hr tablet Take 150 mg by mouth 2 (two) times daily., Until Discontinued, Historical Med    finasteride (PROSCAR) 5 MG tablet TAKE 1 TABLET (5 MG TOTAL) BY MOUTH DAILY., Starting Wed 07/26/2017, Normal    fluorometholone (FML) 0.1 % ophthalmic suspension Place 1 drop 3 (three) times daily into both eyes., Starting Wed 04/26/2017, Historical Med    omeprazole (PRILOSEC) 10 MG capsule TAKE ONE CAPSULE BY MOUTH EVERY DAY, Normal    timolol (TIMOPTIC) 0.5 % ophthalmic solution Place 1 drop daily into both eyes., Starting Thu 06/29/2017, Historical Med    traZODone (DESYREL) 100 MG tablet Take 100 mg at bedtime by mouth. , Starting Mon 11/24/2014, Historical Med      STOP taking these medications     losartan-hydrochlorothiazide (HYZAAR) 100-25 MG  tablet        No Known Allergies Discharge Instructions    Ambulatory referral to Neurology   Complete by:  As directed    An appointment is requested in approximately: 8 weeks   Diet - low sodium heart healthy   Complete by:  As directed    Discharge instructions   Complete by:  As directed    It is important that you read following instructions as well as go over your medication list with RN to help you understand your care after this hospitalization.  Discharge Instructions: Please follow-up with PCP in one week  Please request your primary care physician to go over all Hospital Tests and Procedure/Radiological results at the follow up,  Please get all Hospital records sent to your PCP by signing hospital release before you go home.   Do not take more than prescribed Pain, Sleep and Anxiety Medications. You were cared for by a hospitalist during  your hospital stay. If you have any questions about your discharge medications or the care you received while you were in the hospital after you are discharged, you can call the unit and ask to speak with the hospitalist on call if the hospitalist that took care of you is not available.  Once you are discharged, your primary care physician will handle any further medical issues. Please note that NO REFILLS for any discharge medications will be authorized once you are discharged, as it is imperative that you return to your primary care physician (or establish a relationship with a primary care physician if you do not have one) for your aftercare needs so that they can reassess your need for medications and monitor your lab values. You Must read complete instructions/literature along with all the possible adverse reactions/side effects for all the Medicines you take and that have been prescribed to you. Take any new Medicines after you have completely understood and accept all the possible adverse reactions/side effects. Wear Seat belts while driving. If you have smoked or chewed Tobacco in the last 2 yrs please stop smoking and/or stop any Recreational drug use.   Increase activity slowly   Complete by:  As directed      Discharge Exam: Filed Weights   07/27/17 0530 07/31/17 1400  Weight: 84.6 kg (186 lb 9.6 oz) 84.4 kg (186 lb)   Vitals:   07/31/17 1620 07/31/17 1747  BP:  (!) 143/67  Pulse: (!) 25 (!) 56  Resp: 16 18  Temp:  98.8 F (37.1 C)  SpO2: 96% 98%   General: Appear in no distress, no Rash; Oral Mucosa moist. Cardiovascular: S1 and S2 Present, no Murmur, no JVD Respiratory: Bilateral Air entry present and Clear to Auscultation, no Crackles, no wheezes Abdomen: Bowel Sound present, Soft and no tenderness Extremities: no Pedal edema, no calf tenderness Neurology: Grossly no focal neuro deficit.  The results of significant diagnostics from this hospitalization (including imaging,  microbiology, ancillary and laboratory) are listed below for reference.    Significant Diagnostic Studies: Ct Angio Head W Or Wo Contrast  Result Date: 07/28/2017 CLINICAL DATA:  Stroke follow-up. EXAM: CT ANGIOGRAPHY HEAD AND NECK TECHNIQUE: Multidetector CT imaging of the head and neck was performed using the standard protocol during bolus administration of intravenous contrast. Multiplanar CT image reconstructions and MIPs were obtained to evaluate the vascular anatomy. Carotid stenosis measurements (when applicable) are obtained utilizing NASCET criteria, using the distal internal carotid diameter as the denominator. CONTRAST:  72mL ISOVUE-370 IOPAMIDOL (ISOVUE-370) INJECTION  76% COMPARISON:  Head CT from 2 days prior.  Brain MRI from yesterday. FINDINGS: CT HEAD FINDINGS Brain: Known acute small vessel infarct along the left corona radiata and caudate body. No evidence of progression or hemorrhagic conversion. Chronic small vessel ischemia and atrophy with ventriculomegaly. Vascular: See below Skull: No acute or aggressive finding Sinuses: Moderate chronic sinusitis. Orbits: Bilateral cataract resection. Review of the MIP images confirms the above findings CTA NECK FINDINGS Aortic arch: Atherosclerotic calcification. Aortic tortuosity without dilatation or stenosis. Right carotid system: No stenosis, ulceration, or dissection. No notable atheromatous changes. Left carotid system: Minimal atheromatous plaque at the ICA bulb. No stenosis or ulceration. Vertebral arteries: No proximal subclavian stenosis. Both vertebral arteries are smooth and widely patent to the dura. Skeleton: Spondylosis and degenerative facet spurring. 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 Upper chest: No acute or aggressive finding. Review of the MIP images confirms the above findings CTA HEAD FINDINGS Anterior circulation: Mild atherosclerotic plaque on the carotid siphons. Vessels are  smooth and widely patent. Negative for aneurysm. Posterior circulation: Atherosclerotic calcification on the V4 segments. No stenosis or branch occlusion. Negative for aneurysm. Mild vertebrobasilar tortuosity. Venous sinuses: As permitted by contrast timing, patent. Anatomic variants: None unusual. Delayed phase: No abnormal intracranial enhancement. Review of the MIP images confirms the above findings IMPRESSION: No branch occlusion, stenosis, or visible embolic source. Atherosclerosis is mild for age. Electronically Signed   By: Monte Fantasia M.D.   On: 07/28/2017 09:33   Dg Chest 2 View  Result Date: 07/27/2017 CLINICAL DATA:  Dizzy spells for several months. Syncopal episode today. Irregular heart rate. EXAM: CHEST  2 VIEW COMPARISON:  02/23/2014 FINDINGS: Mild cardiac enlargement. No edema or consolidation in the lungs. No blunting of costophrenic angles. No pneumothorax. Degenerative changes in the spine. Calcification of the aorta. IMPRESSION: Mild cardiac enlargement.  No evidence of active pulmonary disease. Electronically Signed   By: Lucienne Capers M.D.   On: 07/27/2017 01:54   Ct Head Wo Contrast  Result Date: 07/26/2017 CLINICAL DATA:  Intermittent dizziness for few months. Dizziness today. EXAM: CT HEAD WITHOUT CONTRAST TECHNIQUE: Contiguous axial images were obtained from the base of the skull through the vertex without intravenous contrast. COMPARISON:  MRI brain 08/12/2009 FINDINGS: Brain: Diffuse cerebral atrophy. Ventricular dilatation consistent with central atrophy. Low-attenuation changes in the deep white matter consistent with small vessel ischemia. No evidence of acute infarction, hemorrhage, hydrocephalus, extra-axial collection or mass lesion/mass effect. Vascular: Vertebrobasilar and internal carotid artery calcifications. Skull: The calvarium appears intact. Sinuses/Orbits: Mucosal thickening in the paranasal sinuses. No acute air-fluid levels. Mastoid air cells are not  opacified. Other: None. IMPRESSION: No acute intracranial abnormalities. Chronic atrophy and small vessel ischemic changes. Electronically Signed   By: Lucienne Capers M.D.   On: 07/26/2017 21:55   Ct Angio Neck W Or Wo Contrast  Result Date: 07/28/2017 CLINICAL DATA:  Stroke follow-up. EXAM: CT ANGIOGRAPHY HEAD AND NECK TECHNIQUE: Multidetector CT imaging of the head and neck was performed using the standard protocol during bolus administration of intravenous contrast. Multiplanar CT image reconstructions and MIPs were obtained to evaluate the vascular anatomy. Carotid stenosis measurements (when applicable) are obtained utilizing NASCET criteria, using the distal internal carotid diameter as the denominator. CONTRAST:  59mL ISOVUE-370 IOPAMIDOL (ISOVUE-370) INJECTION 76% COMPARISON:  Head CT from 2 days prior.  Brain MRI from yesterday. FINDINGS: CT HEAD FINDINGS Brain: Known acute small vessel infarct along the left corona  radiata and caudate body. No evidence of progression or hemorrhagic conversion. Chronic small vessel ischemia and atrophy with ventriculomegaly. Vascular: See below Skull: No acute or aggressive finding Sinuses: Moderate chronic sinusitis. Orbits: Bilateral cataract resection. Review of the MIP images confirms the above findings CTA NECK FINDINGS Aortic arch: Atherosclerotic calcification. Aortic tortuosity without dilatation or stenosis. Right carotid system: No stenosis, ulceration, or dissection. No notable atheromatous changes. Left carotid system: Minimal atheromatous plaque at the ICA bulb. No stenosis or ulceration. Vertebral arteries: No proximal subclavian stenosis. Both vertebral arteries are smooth and widely patent to the dura. Skeleton: Spondylosis and degenerative facet spurring. 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 Upper chest: No acute or aggressive finding. Review of the MIP images confirms the above findings CTA HEAD  FINDINGS Anterior circulation: Mild atherosclerotic plaque on the carotid siphons. Vessels are smooth and widely patent. Negative for aneurysm. Posterior circulation: Atherosclerotic calcification on the V4 segments. No stenosis or branch occlusion. Negative for aneurysm. Mild vertebrobasilar tortuosity. Venous sinuses: As permitted by contrast timing, patent. Anatomic variants: None unusual. Delayed phase: No abnormal intracranial enhancement. Review of the MIP images confirms the above findings IMPRESSION: No branch occlusion, stenosis, or visible embolic source. Atherosclerosis is mild for age. Electronically Signed   By: Monte Fantasia M.D.   On: 07/28/2017 09:33   Mr Brain Wo Contrast  Result Date: 07/27/2017 CLINICAL DATA:  Ataxia and possible stroke. EXAM: MRI HEAD WITHOUT CONTRAST TECHNIQUE: Multiplanar, multiecho pulse sequences of the brain and surrounding structures were obtained without intravenous contrast. COMPARISON:  Head CT 07/26/2017 Brain MRI 08/12/2009 FINDINGS: Brain: The midline structures are normal. There is an acute infarct within the left caudate body measuring approximately 10 x 8 mm. No mass lesion, hydrocephalus, dural abnormality or extra-axial collection. There is multifocal white matter hyperintensity suggesting chronic ischemic microangiopathy. Old right basal ganglia lacunar infarct. Generalized volume loss without lobar predominance. Foci of chronic microhemorrhage in the brainstem and both thalami. Vascular: Major intracranial arterial and venous sinus flow voids are preserved. Skull and upper cervical spine: The visualized skull base, calvarium, upper cervical spine and extracranial soft tissues are normal. Sinuses/Orbits: Moderate maxillary mucosal thickening. No fluid levels. Normal orbits. IMPRESSION: 1. Small acute infarct of the left caudate body. No hemorrhage or mass effect. 2. Chronic ischemic microangiopathy and generalized volume loss. Electronically Signed   By:  Ulyses Jarred M.D.   On: 07/27/2017 03:21   Nm Myocar Multi W/spect W/wall Motion / Ef  Result Date: 07/29/2017  There was no ST segment deviation noted during stress.  Findings consistent with prior moderate to large mid to distal anterior and apical myocardial infarction with moderate peri-infarct ischemia. Small basal septal infarct.  The left ventricular ejection fraction is moderately decreased (30-44%).  This is a high risk study. High risk based on decreased LVEF and moderate ischemia.  Nuclear stress EF: 32%.     Microbiology: No results found for this or any previous visit (from the past 240 hour(s)).   Labs: CBC: Recent Labs  Lab 07/26/17 1947 07/27/17 0519 07/28/17 0337  WBC 7.4 5.7 8.8  NEUTROABS 4.6 2.9 5.6  HGB 14.9 13.5 14.0  HCT 41.9 38.7* 40.0  MCV 88.6 89.0 87.9  PLT 187 170 474*   Basic Metabolic Panel: Recent Labs  Lab 07/26/17 1941  07/27/17 0519 07/28/17 0337 07/29/17 0442 07/30/17 0355 07/31/17 0529  NA  --    < > 140 137 139 138 142  K  --    < > 3.2* 3.5 3.9 4.3 3.9  CL  --    < > 106 103 106 104 106  CO2  --    < > 26 28 28 28 31   GLUCOSE  --    < > 135* 115* 90 103* 101*  BUN  --    < > 19 18 20  24* 20  CREATININE  --    < > 0.95 1.01 1.01 1.11 0.99  CALCIUM  --    < > 8.8* 9.2 9.0 9.1 9.0  MG 1.7  --   --  1.6* 1.9  --   --    < > = values in this interval not displayed.   Liver Function Tests: Recent Labs  Lab 07/26/17 1947 07/28/17 0337  AST 28 30  ALT 32 31  ALKPHOS 83 78  BILITOT 0.6 1.0  PROT 7.1 6.5  ALBUMIN 3.9 3.4*   No results for input(s): LIPASE, AMYLASE in the last 168 hours. No results for input(s): AMMONIA in the last 168 hours. Cardiac Enzymes: Recent Labs  Lab 07/27/17 0016 07/27/17 0519 07/27/17 1254  TROPONINI <0.03 <0.03 <0.03   BNP (last 3 results) Recent Labs    07/28/17 1603  BNP 282.7*   CBG: Recent Labs  Lab 07/26/17 1949 07/26/17 2042 07/31/17 1113  GLUCAP 103* 89 90   Time  spent: 35 minutes  Signed:  Jasn Xia  [facility] 07/31/2017 , 11:47 AM

## 2017-08-02 ENCOUNTER — Encounter (HOSPITAL_COMMUNITY): Payer: Self-pay | Admitting: Cardiology

## 2017-08-02 DIAGNOSIS — I69351 Hemiplegia and hemiparesis following cerebral infarction affecting right dominant side: Secondary | ICD-10-CM | POA: Diagnosis not present

## 2017-08-02 DIAGNOSIS — G4733 Obstructive sleep apnea (adult) (pediatric): Secondary | ICD-10-CM | POA: Diagnosis not present

## 2017-08-02 DIAGNOSIS — F329 Major depressive disorder, single episode, unspecified: Secondary | ICD-10-CM | POA: Diagnosis not present

## 2017-08-02 DIAGNOSIS — F419 Anxiety disorder, unspecified: Secondary | ICD-10-CM | POA: Diagnosis not present

## 2017-08-02 DIAGNOSIS — N400 Enlarged prostate without lower urinary tract symptoms: Secondary | ICD-10-CM | POA: Diagnosis not present

## 2017-08-02 DIAGNOSIS — W01111D Fall on same level from slipping, tripping and stumbling with subsequent striking against power tool or machine, subsequent encounter: Secondary | ICD-10-CM | POA: Diagnosis not present

## 2017-08-02 DIAGNOSIS — K219 Gastro-esophageal reflux disease without esophagitis: Secondary | ICD-10-CM | POA: Diagnosis not present

## 2017-08-02 DIAGNOSIS — I10 Essential (primary) hypertension: Secondary | ICD-10-CM | POA: Diagnosis not present

## 2017-08-02 NOTE — Telephone Encounter (Signed)
Noted.  Patient has a loop recorder in place to monitor heart rhythm

## 2017-08-02 NOTE — Telephone Encounter (Signed)
Please advise 

## 2017-08-03 ENCOUNTER — Telehealth: Payer: Self-pay | Admitting: *Deleted

## 2017-08-03 DIAGNOSIS — H53431 Sector or arcuate defects, right eye: Secondary | ICD-10-CM | POA: Diagnosis not present

## 2017-08-03 DIAGNOSIS — H401121 Primary open-angle glaucoma, left eye, mild stage: Secondary | ICD-10-CM | POA: Diagnosis not present

## 2017-08-03 DIAGNOSIS — H401112 Primary open-angle glaucoma, right eye, moderate stage: Secondary | ICD-10-CM | POA: Diagnosis not present

## 2017-08-03 NOTE — Telephone Encounter (Signed)
Spoke with patient regarding sending manual transmission. Patient states he is not home right now and has not read the manual yet on how to send a manual transmission. Advised patient to give the [facility] a call back when he gets home and we can attempt to send manual transmission. Also, advised we will go over education at wound check on 08/09/17 at 4pm. Patient verbalized understanding and appreciation.

## 2017-08-03 NOTE — Telephone Encounter (Signed)
Attempted call regarding sending manual transmission to review all "AF" episodes. No answer. Voicemail full.    Patient to Follow-up 08/09/17 for wound check.

## 2017-08-07 ENCOUNTER — Other Ambulatory Visit: Payer: Self-pay

## 2017-08-07 ENCOUNTER — Telehealth: Payer: Self-pay | Admitting: Internal Medicine

## 2017-08-07 ENCOUNTER — Ambulatory Visit: Payer: PPO | Admitting: Internal Medicine

## 2017-08-07 ENCOUNTER — Encounter: Payer: Self-pay | Admitting: Internal Medicine

## 2017-08-07 VITALS — BP 148/88 | HR 47 | Temp 98.1°F | Wt 189.6 lb

## 2017-08-07 DIAGNOSIS — I639 Cerebral infarction, unspecified: Secondary | ICD-10-CM

## 2017-08-07 DIAGNOSIS — Z8673 Personal history of transient ischemic attack (TIA), and cerebral infarction without residual deficits: Secondary | ICD-10-CM | POA: Diagnosis not present

## 2017-08-07 DIAGNOSIS — I10 Essential (primary) hypertension: Secondary | ICD-10-CM | POA: Diagnosis not present

## 2017-08-07 DIAGNOSIS — F418 Other specified anxiety disorders: Secondary | ICD-10-CM

## 2017-08-07 DIAGNOSIS — I255 Ischemic cardiomyopathy: Secondary | ICD-10-CM

## 2017-08-07 MED ORDER — ATORVASTATIN CALCIUM 80 MG PO TABS: 80.0000 mg | ORAL_TABLET | Freq: Every day | ORAL | 4 refills | Status: DC

## 2017-08-07 NOTE — Progress Notes (Signed)
Subjective:    Patient ID: Jared [name], male    DOB: 06/10/1941, 76 y.o.   MRN: [id number]  HPI  Date of admission: 07/26/2017             Date of discharge: 07/31/2017     Discharge Diagnoses:  Principal Problem:   Dizziness Active Problems:   Anxiety state   Essential hypertension   BPH (benign prostatic hyperplasia)   Fall at home, initial encounter   Frequent PVCs   CVA (cerebral vascular accident) ([place])   Ischemic cardiomyopathy   Hyperlipidemia   Admitted From: home Disposition:  Home with home health  Recommendations for Outpatient Follow-up:  1. Please follow up with cardiology and neurlogy as recommended  2. Follow up with PCP in 1 week   1. Acute left caudate body CVA. Likely small vessel disease but possibility of embolus cannot be ruled out. Echocardiogram shows 35-40% EF. LDL 102 on Lipitor 40 mg. On aspirin 325 mg as well. PT OT recommends home with home health. CTA head and neck shows no acute stenosis or obstruction. Symptoms are improving right now. Hemoglobin A1c 5.3. TEE to rule out any embolus Underwent loop recorder placement   76 year old patient who is seen today following a recent hospital discharge 7 days ago.  He presented after experiencing episodes of disequilibrium associated with frequent falls. Evaluation revealed a small acute left caudate body CVA.  Prior to his discharge the patient underwent placement of a loop recorder. Evaluation included a transthoracic 2D echocardiogram as well as a nuclear stress test that revealed an ejection fraction of 30-45%.  The nuclear stress test was high risk and suggestive of a moderate to large anterior and apical prior MI associated with moderate peri-infarct ischemia.  He is scheduled for cardiology follow-up in 2 days Prior to his discharge the patient underwent a TEE but suggested a normal LVEF of 60-65% and no wall motion abnormalities.  Since his discharge he has felt a bit depressed  Since  his discharge he has felt a bit depressed but no recurrent focal neurological symptoms and no cardiopulmonary complaints.  He is scheduled for a behavioral health follow-up after the first of the year  Past Medical History:  Diagnosis Date  . ALLERGIC RHINITIS 10/05/2007  . ANXIETY 10/05/2007  . BENIGN PROSTATIC HYPERTROPHY 04/08/2009  . COLONIC POLYPS, HX OF 10/05/2007  . DEPRESSION 10/05/2007  . GERD 10/05/2007  . Heart murmur   . HYPERTENSION 10/05/2007  . NEPHROLITHIASIS, HX OF 10/05/2007  . SLEEP APNEA, OBSTRUCTIVE 10/02/2008     Social History   Socioeconomic History  . Marital status: Widowed    Spouse name: Not on file  . Number of children: Not on file  . Years of education: Not on file  . Highest education level: Not on file  Social Needs  . Financial resource strain: Not on file  . Food insecurity - worry: Not on file  . Food insecurity - inability: Not on file  . Transportation needs - medical: Not on file  . Transportation needs - non-medical: Not on file  Occupational History  . Not on file  Tobacco Use  . Smoking status: Never Smoker  . Smokeless tobacco: Never Used  Substance and Sexual Activity  . Alcohol use: No  . Drug use: No  . Sexual activity: Not on file  Other Topics Concern  . Not on file  Social History Narrative  . Not on file    Past Surgical History:  Procedure  Laterality Date  . APPENDECTOMY    . CATARACT EXTRACTION  2014   bilateral  . CHOLECYSTECTOMY    . FOOT SURGERY     left  . HERNIA REPAIR  2014  . LOOP RECORDER INSERTION N/A 07/31/2017   Performed by Evans Lance, MD at [facility]  . TRANSESOPHAGEAL ECHOCARDIOGRAM (TEE) WITH LOOP N/A 07/31/2017   Performed by Dorothy Spark, MD at [facility]  . WRIST FRACTURE SURGERY     right x2   left x1    Family History  Problem Relation Age of Onset  . Colon cancer Maternal Aunt 70    No Known Allergies  Current Outpatient Medications on File Prior to Visit    Medication Sig Dispense Refill  . ALPRAZolam (XANAX) 0.5 MG tablet Take 0.5 mg 3 (three) times daily by mouth.     Marland Kitchen amLODipine (NORVASC) 10 MG tablet TAKE 1 TABLET BY MOUTH EVERY DAY (Patient taking differently: TAKE 10 mg  TABLET BY MOUTH EVERY DAY) 90 tablet 0  . aspirin EC 325 MG EC tablet Take 1 tablet (325 mg total) daily by mouth. 30 tablet 0  . buPROPion (WELLBUTRIN SR) 150 MG 12 hr tablet Take 150 mg by mouth 2 (two) times daily.    . carvedilol (COREG) 6.25 MG tablet Take 1 tablet (6.25 mg total) 2 (two) times daily with a meal by mouth. 60 tablet 0  . finasteride (PROSCAR) 5 MG tablet TAKE 1 TABLET (5 MG TOTAL) BY MOUTH DAILY. 90 tablet 1  . fluorometholone (FML) 0.1 % ophthalmic suspension Place 1 drop 3 (three) times daily into both eyes.  0  . losartan (COZAAR) 50 MG tablet Take 1 tablet (50 mg total) daily by mouth. 30 tablet 0  . omeprazole (PRILOSEC) 10 MG capsule TAKE ONE CAPSULE BY MOUTH EVERY DAY (Patient taking differently: TAKE 10 mg CAPSULE BY MOUTH EVERY DAY) 90 capsule 3  . RESTASIS 0.05 % ophthalmic emulsion Place 1 drop 2 (two) times daily into both eyes.  4  . timolol (TIMOPTIC) 0.5 % ophthalmic solution Place 1 drop daily into both eyes.  0  . traZODone (DESYREL) 100 MG tablet Take 100 mg at bedtime by mouth.      No current facility-administered medications on file prior to visit.     BP (!) 148/88 (BP Location: Left Arm, Patient Position: Sitting, Cuff Size: Normal)   Pulse (!) 47   Temp 98.1 F (36.7 C) (Oral)   Wt 189 lb 9.6 oz (86 kg)   BMI 27.20 kg/m     Review of Systems  Constitutional: Negative for appetite change, chills, fatigue and fever.  HENT: Negative for congestion, dental problem, ear pain, hearing loss, sore throat, tinnitus, trouble swallowing and voice change.   Eyes: Negative for pain, discharge and visual disturbance.  Respiratory: Negative for cough, chest tightness, wheezing and stridor.   Cardiovascular: Negative for chest pain,  palpitations and leg swelling.  Gastrointestinal: Negative for abdominal distention, abdominal pain, blood in stool, constipation, diarrhea, nausea and vomiting.  Genitourinary: Negative for difficulty urinating, discharge, flank pain, genital sores, hematuria and urgency.  Musculoskeletal: Negative for arthralgias, back pain, gait problem, joint swelling, myalgias and neck stiffness.  Skin: Negative for rash.  Neurological: Negative for dizziness, syncope, speech difficulty, weakness, numbness and headaches.  Hematological: Negative for adenopathy. Does not bruise/bleed easily.  Psychiatric/Behavioral: Positive for dysphoric mood. Negative for behavioral problems. The patient is not nervous/anxious.  Objective:   Physical Exam  Constitutional: He is oriented to person, place, and time. He appears well-developed.  Blood pressure 130/80  HENT:  Head: Normocephalic.  Right Ear: External ear normal.  Left Ear: External ear normal.  Eyes: Conjunctivae and EOM are normal.  Neck: Normal range of motion.  Cardiovascular: Normal rate and normal heart sounds.  Occasional ectopics  Pulmonary/Chest: Breath sounds normal.  Abdominal: Bowel sounds are normal.  Musculoskeletal: Normal range of motion. He exhibits no edema or tenderness.  Neurological: He is alert and oriented to person, place, and time.  Psychiatric: He has a normal mood and affect. His behavior is normal.          Assessment & Plan:   Status post acute left caudate CVA Ischemic cardiomyopathy.  Final cardiac study was a TEE which revealed a normal left ventricular ejection fraction Dyslipidemia Hypertension Anxiety disorder with depressed mood.  Follow-up behavioral health.  Continue bupropion  Cardiology and neurology follow-up as scheduled No change in medical regimen except increase atorvastatin to 80 mg daily follow-up here in 3 months or as needed  [employer]

## 2017-08-07 NOTE — Patient Outreach (Signed)
[facility]) Care Management  08/07/2017  Jared [name] 12/16/1940 [id number]   EMMI: stroke Referral date: 08/07/17 Referral source: EMMI stroke red alert Referral reason: Feeling worse overall: YES Day # 3  Telephone call to patient regarding EMMI stroke red alert. HIPAA verified with patient. Discussed EMMI stroke program with patient. Patient states he is not feeling worse overall.  Patient states the recording is not taking his answer correctly. Patient states he is not having any problems. Patient states he has an appointment with his primary MD on today.  Patient states he has not scheduled a follow up appointment with the cardiologist or neurologist yet. RNCM advised patient to call and scheduled as requested at discharge from hospital. Patient verbalized understanding. Patient states he has all of his medications and is taking them as advised. Patient states he is able to provide his own transport to appointments.  RNCM reviewed signs and symptoms of stroke. Advised to call 911 for stroke like symptoms.  RNCM advised patient to notify MD of any changes in condition prior to scheduled appointment. Patient would not take contact phone numbers down for [facility] or 24 hour nurse advise line.   RNCM verified patient aware of 911 services for urgent/ emergent needs.  PLAN: RNCM will refer patient to care management assistant to close due to patient being assessed and having no further needs.  RNCM will send patient Advance directive packet as he requested.   Quinn Plowman RN,BSN,CCM [facility] Telephonic  [phone]

## 2017-08-07 NOTE — Telephone Encounter (Signed)
Copied from [place] [phone]. Topic: Quick Communication - See Telephone Encounter >> Aug 07, 2017 11:49 AM Bea Graff, NT wrote: CRM for notification. See Telephone encounter for: Jared [name] from [facility] and stated that this pts heart rate is running very low and he is on several medications that will cause this. Currently at rest it is in the 40s, when moving around it does go up some. Patient is reporting fatigue to Jared [name], the physical therapist. CB# [phone] Patient is coming in for an appt today and she wanted the dr to be aware.  08/07/17.

## 2017-08-07 NOTE — Patient Instructions (Addendum)
Limit your sodium (Salt) intake  Cardiology follow-up as scheduled  Increase Lipitor to 80 mg daily.  Otherwise no change in medical regimen

## 2017-08-07 NOTE — Telephone Encounter (Signed)
Please note

## 2017-08-08 ENCOUNTER — Other Ambulatory Visit: Payer: Self-pay

## 2017-08-08 NOTE — Patient Outreach (Signed)
[facility]) Care Management  08/08/2017  Jared [name] 09-20-1940 [id number]  EMMI: stroke Referral date: 08/07/17 Referral source: EMMI stroke red alert Referral reason: Feeling worse overall: YES Day # 4 Attempt #1  Telephone call to patient regarding EMMI stroke red alert. Unable to reach patient or leave voice message due to mailbox being full  PLAN; RNCM will attempt 2nd telephone call within 3 business days.  Quinn Plowman RN,BSN,CCM [facility] Telephonic  [phone]

## 2017-08-09 ENCOUNTER — Other Ambulatory Visit: Payer: Self-pay

## 2017-08-09 ENCOUNTER — Ambulatory Visit (INDEPENDENT_AMBULATORY_CARE_PROVIDER_SITE_OTHER): Payer: Self-pay | Admitting: *Deleted

## 2017-08-09 DIAGNOSIS — I639 Cerebral infarction, unspecified: Secondary | ICD-10-CM

## 2017-08-09 LAB — CUP PACEART INCLINIC DEVICE CHECK
Date Time Interrogation Session: 20181121165131
MDC IDC PG IMPLANT DT: 20181112

## 2017-08-09 NOTE — Progress Notes (Signed)
Wound check appointment. Steri-strips removed. Wound without redness or edema. Incision edges approximated, wound well healed. Normal device function. Battery status: good. R-waves 0.72mV. No symptom or tachy episodes. Pause and brady detection off at implant. 22 AF episodes--false, MAT per Dr. Lovena Le. Dr. Lovena Le saw patient and discussed plan with him. Reprogrammed AT/AF recording threshold to episodes >/= 8min per GT. Patient educated about wound care and Carelink monitor. Monthly summary reports and ROV with GT on 11/09/17.  Patient expressed concerns regarding EF interpretation from recent nuclear stress test and TEE. Patient was told to follow-up with cardiology by PCP. Reviewed Dr. Truddie Hidden OV note from 08/07/17 with Dr. Lovena Le.  Per Dr. Lovena Le, patient is on correct medication regimen, plan to follow-up in 3 months on 11/09/17.  Also clarified patient's atorvastatin dose. Initially, he thought that dose had been decreased to 40mg  daily by Dr. Burnice Logan so med list was updated. Upon further review of 11/19 OV note, plan was to increase dose to 80mg  daily. Medication list fixed and patient reminded of change. Medication instructions reviewed.

## 2017-08-09 NOTE — Patient Outreach (Signed)
[facility]) Care Management  08/09/2017  Jared [name] 04/17/1941 [id number]  EMMI:stroke Referral date:08/07/17 Referral source:EMMI stroke red alert Referral reason:Feeling worse overall: YES Day #4 Attempt #2  Telephone call to patient regarding EMMI stroke red alert. Unable to reach patient or leave voice message due to mailbox being full  PLAN; RNCM will send outreach letter to attempt contact.   Quinn Plowman RN,BSN,CCM [facility] Telephonic  [phone]

## 2017-08-23 ENCOUNTER — Other Ambulatory Visit: Payer: Self-pay

## 2017-08-23 ENCOUNTER — Telehealth: Payer: Self-pay | Admitting: *Deleted

## 2017-08-23 NOTE — Telephone Encounter (Signed)
Called patient to make him aware that he does not need to send manual LINQ transmissions daily.  Advised that monitor should transmit automatically and that our office will contact him if a full report is needed.  Patient is appreciative and denies questions or concerns at this time.

## 2017-08-23 NOTE — Patient Outreach (Signed)
[facility]) Care Management  08/23/2017  Jared [name] May 27, 1941 [id number]  EMMI:stroke Referral date:08/07/17 Referral source:EMMI stroke red alert Referral reason:Feeling worse overall: YES Day #4  No response from patient after 2 telephone calls and letter outreach attempt.  PLAN; RNCM will refer patient to care management assistant to close due to being unable to reach.  RNCM will send patient "know before you go sheet."  Quinn Plowman RN,BSN,CCM [facility] Telephonic  [phone]

## 2017-08-30 ENCOUNTER — Ambulatory Visit (INDEPENDENT_AMBULATORY_CARE_PROVIDER_SITE_OTHER): Payer: PPO | Admitting: *Deleted

## 2017-08-30 DIAGNOSIS — I639 Cerebral infarction, unspecified: Secondary | ICD-10-CM | POA: Diagnosis not present

## 2017-08-31 NOTE — Progress Notes (Signed)
Carelink Summary Report / Loop Recorder 

## 2017-09-06 ENCOUNTER — Other Ambulatory Visit: Payer: Self-pay | Admitting: Internal Medicine

## 2017-09-07 DIAGNOSIS — M1711 Unilateral primary osteoarthritis, right knee: Secondary | ICD-10-CM | POA: Diagnosis not present

## 2017-09-07 LAB — CUP PACEART REMOTE DEVICE CHECK
MDC IDC PG IMPLANT DT: 20181112
MDC IDC SESS DTM: 20181212210543

## 2017-09-08 ENCOUNTER — Telehealth: Payer: Self-pay | Admitting: *Deleted

## 2017-09-08 NOTE — Telephone Encounter (Signed)
Spoke with patient.  Manual transmission actually received on evening of 09/07/17.  All available "AF" ECGs appear ?MAT w/PVCs.  ECGs printed and placed in Dr. Macon Large folder for review.  Patient aware that we will call him if Dr. Lovena Le has any further recommendations.  He denies questions or concerns at this time.

## 2017-09-08 NOTE — Telephone Encounter (Signed)
Attempted to reach patient to request manual Carelink transmission for review.  No answer, VM full, unable to leave a message.

## 2017-09-09 ENCOUNTER — Other Ambulatory Visit: Payer: Self-pay | Admitting: Internal Medicine

## 2017-09-11 ENCOUNTER — Other Ambulatory Visit: Payer: Self-pay | Admitting: Internal Medicine

## 2017-09-13 ENCOUNTER — Other Ambulatory Visit: Payer: Self-pay | Admitting: Internal Medicine

## 2017-09-21 ENCOUNTER — Telehealth: Payer: Self-pay

## 2017-09-21 NOTE — Telephone Encounter (Signed)
LEft vm for Santiago Bur surgical scheduler clearance for [employer] at [phone]. Rn left vm that pt had stroke in 07/2017. Rn usually the stroke MD like pts to wait 6 months to have surgery. Rn left contact number on vm if she had any questions. Clearance form will be on Dr. Erlinda Hong desk.Pt had appt in January 2018 but r/s for February 10-2017. Form put on Dr. Erlinda Hong desk.

## 2017-09-25 ENCOUNTER — Ambulatory Visit: Payer: PPO | Admitting: Internal Medicine

## 2017-09-25 NOTE — Telephone Encounter (Signed)
Hi, Katrina, I saw this pt in hospital just one day and Dr. Leonie Man saw the pt for the next several days and signed off after. He should be more familiar with this pt. Please let him factor in his opinion. Thanks much.   Rosalin Hawking, MD PhD Stroke Neurology 09/25/2017 5:22 PM

## 2017-09-26 NOTE — Telephone Encounter (Signed)
Clearance form on Dr.Sethi inbasket for appt in February 2019.

## 2017-09-29 ENCOUNTER — Ambulatory Visit (INDEPENDENT_AMBULATORY_CARE_PROVIDER_SITE_OTHER): Payer: PPO | Admitting: *Deleted

## 2017-09-29 DIAGNOSIS — I639 Cerebral infarction, unspecified: Secondary | ICD-10-CM

## 2017-10-02 NOTE — Telephone Encounter (Signed)
This patient had a stroke recently in November 2018 and cannot be neurologically cleared for at least 3 months to stop his antiplatelet therapy for elective procedure since his stroke. Advise the patient to see me in February and we can address his clearance at that visit

## 2017-10-03 NOTE — Progress Notes (Signed)
Carelink Summary Report / Loop Recorder 

## 2017-10-03 NOTE — Telephone Encounter (Signed)
If patient calls back please let him the surgery clearance will have to wait till his appt with our office in February 2019. It will be discuss at his visit. Pt had stroke in 07/2017.   Unable to leave vm for patient. VM is not full. Cant leave message. Will try again about clearance form.

## 2017-10-10 ENCOUNTER — Ambulatory Visit (INDEPENDENT_AMBULATORY_CARE_PROVIDER_SITE_OTHER): Payer: PPO | Admitting: Psychology

## 2017-10-10 ENCOUNTER — Ambulatory Visit: Payer: PPO | Admitting: Neurology

## 2017-10-10 DIAGNOSIS — F341 Dysthymic disorder: Secondary | ICD-10-CM

## 2017-10-10 DIAGNOSIS — F411 Generalized anxiety disorder: Secondary | ICD-10-CM | POA: Diagnosis not present

## 2017-10-10 LAB — CUP PACEART REMOTE DEVICE CHECK
MDC IDC PG IMPLANT DT: 20181112
MDC IDC SESS DTM: 20190111223653

## 2017-10-30 ENCOUNTER — Ambulatory Visit (INDEPENDENT_AMBULATORY_CARE_PROVIDER_SITE_OTHER): Payer: PPO | Admitting: *Deleted

## 2017-10-30 DIAGNOSIS — I639 Cerebral infarction, unspecified: Secondary | ICD-10-CM

## 2017-10-30 NOTE — Progress Notes (Signed)
Carelink Summary Report / Loop Recorder 

## 2017-11-02 ENCOUNTER — Encounter: Payer: Self-pay | Admitting: Internal Medicine

## 2017-11-07 ENCOUNTER — Ambulatory Visit (INDEPENDENT_AMBULATORY_CARE_PROVIDER_SITE_OTHER): Payer: PPO | Admitting: Neurology

## 2017-11-07 ENCOUNTER — Encounter: Payer: Self-pay | Admitting: Neurology

## 2017-11-07 VITALS — BP 132/58 | HR 88 | Ht 70.0 in | Wt 180.4 lb

## 2017-11-07 DIAGNOSIS — I10 Essential (primary) hypertension: Secondary | ICD-10-CM | POA: Diagnosis not present

## 2017-11-07 DIAGNOSIS — G4733 Obstructive sleep apnea (adult) (pediatric): Secondary | ICD-10-CM

## 2017-11-07 DIAGNOSIS — F3289 Other specified depressive episodes: Secondary | ICD-10-CM | POA: Diagnosis not present

## 2017-11-07 DIAGNOSIS — I639 Cerebral infarction, unspecified: Secondary | ICD-10-CM

## 2017-11-07 DIAGNOSIS — E7841 Elevated Lipoprotein(a): Secondary | ICD-10-CM | POA: Diagnosis not present

## 2017-11-07 DIAGNOSIS — R9430 Abnormal result of cardiovascular function study, unspecified: Secondary | ICD-10-CM | POA: Insufficient documentation

## 2017-11-07 NOTE — Patient Instructions (Signed)
I had a long d/w patient about his recent stroke, risk for recurrent stroke/TIAs, personally independently reviewed imaging studies and stroke evaluation results and answered questions.Continue aspirin 325 mg daily  for secondary stroke prevention and maintain strict control of hypertension with blood pressure goal below 130/90, diabetes with hemoglobin A1c goal below 6.5% and lipids with LDL cholesterol goal below 70 mg/dL. I also advised the patient to eat a healthy diet with plenty of whole grains, cereals, fruits and vegetables, exercise regularly and maintain ideal body weight Followup in the future with Janett Billow, NP in 3 months  -continue to be compliant with aspirin 325mg   -continue to follow loop recorder and we will notify you if an irregular heart beat is detected  -follow up with this office in 3 months and we will discuss clearance for knee surgery at that time  -please call earlier if needed or with any questions

## 2017-11-07 NOTE — Progress Notes (Signed)
[facility] [address] [place]. [place] [address] [phone]       OFFICE FOLLOW UP NOTE  [name] Date of Birth:  1941-06-03 Medical Record Number:  [id number]   Reason for Referral:  Hospital stroke follow up  CHIEF COMPLAINT:  Chief Complaint  Patient presents with  . Follow-up    Stroke follow up from hospital. Pt was seen by Dr. Erlinda Hong in hospital,and Dr. Leonie Man    HPI: Jared [name] is being seen today in the office for cryptogenic stroke on 07/26/17. History obtained from patient and chart review. Reviewed all radiology images and labs personally. [name] is a 77 y.o. male with PMH of HTN, OSA, BPH admitted for episode of dizziness and fall at home on 07/26/17.  CT of head showed no acute intracranial abnormalities.  CTA of head and neck showed no branch occlusion, stenosis, or visible embolic source.  MRI did show a small acute infarct in the left caudate body.  2D echo showed an EF of 35-40%.  LDL 102.  HbA1c 5.3.  Patient had no antithrombotic prior to admission and was discharged on aspirin 325 mg daily.  Due to possible cardioembolic source with a low EF of 35-40%, TEE was done which was negative for cardioembolic source and a loop recorder was placed.  Patient was started on 30 mg daily of Lipitor.  Patient discharged home in stable condition with recommendation of home PT/OT.  Since discharge, patient states he has been doing well.  Patient continues to take aspirin 325 mg and denies increase in bleeding and bruising.  Patient was increased from Lipitor 40 mg to 80 mg by PCP.  He is tolerating this well without myalgias.  Patient does have history of OSA but unable to tolerate mask.  Loop recorder still in place without evidence of atrial fibrillation episodes at this time.  Patient states he does get slightly dizzy with bending over but denies recent falls.  Patient did have home PT for 3-4 sessions but states this was not helpful for him.  Patient  also states he has mild memory loss where he may place something somewhere and forget about it.  Patient does currently live alone but has a daughter in the area.  Patient is questioning whether he can have right knee arthroscopy.  As this is an elective procedure and not emergent, patient was advised to wait another 3 months as that will make it a total of 6 months post stroke.  Patient in agreement to this.  Patient denies new or worsening stroke/TIA symptoms.   ROS:   14 system review of systems performed and negative with exception of depression, anxiety, disinterest in activities, and snoring  PMH:  Past Medical History:  Diagnosis Date  . ALLERGIC RHINITIS 10/05/2007  . ANXIETY 10/05/2007  . BENIGN PROSTATIC HYPERTROPHY 04/08/2009  . COLONIC POLYPS, HX OF 10/05/2007  . DEPRESSION 10/05/2007  . GERD 10/05/2007  . Heart murmur   . HYPERTENSION 10/05/2007  . NEPHROLITHIASIS, HX OF 10/05/2007  . SLEEP APNEA, OBSTRUCTIVE 10/02/2008  . Stroke [facility])     PSH:  Past Surgical History:  Procedure Laterality Date  . APPENDECTOMY    . CATARACT EXTRACTION  2014   bilateral  . CHOLECYSTECTOMY    . FOOT SURGERY     left  . HERNIA REPAIR  2014  . LOOP RECORDER INSERTION N/A 07/31/2017   Procedure: LOOP RECORDER INSERTION;  Surgeon: Evans Lance, MD;  Location: [facility]  INVASIVE CV LAB;  Service: Cardiovascular;  Laterality: N/A;  . TEE WITHOUT CARDIOVERSION N/A 07/31/2017   Procedure: TRANSESOPHAGEAL ECHOCARDIOGRAM (TEE) WITH LOOP;  Surgeon: Dorothy Spark, MD;  Location: [place];  Service: Cardiovascular;  Laterality: N/A;  . WRIST FRACTURE SURGERY     right x2   left x1    Social History:  Social History   Socioeconomic History  . Marital status: Widowed    Spouse name: Not on file  . Number of children: Not on file  . Years of education: Not on file  . Highest education level: Not on file  Social Needs  . Financial resource strain: Not on file  . Food insecurity - worry: Not  on file  . Food insecurity - inability: Not on file  . Transportation needs - medical: Not on file  . Transportation needs - non-medical: Not on file  Occupational History  . Not on file  Tobacco Use  . Smoking status: Never Smoker  . Smokeless tobacco: Never Used  Substance and Sexual Activity  . Alcohol use: No  . Drug use: No  . Sexual activity: Not on file  Other Topics Concern  . Not on file  Social History Narrative  . Not on file    Family History:  Family History  Problem Relation Age of Onset  . Colon cancer Maternal Aunt 70    Medications:   Current Outpatient Medications on File Prior to Visit  Medication Sig Dispense Refill  . ALPRAZolam (XANAX) 0.5 MG tablet Take 0.5 mg 3 (three) times daily by mouth.     Marland Kitchen amLODipine (NORVASC) 10 MG tablet TAKE 1 TABLET BY MOUTH EVERY DAY 90 tablet 0  . aspirin EC 325 MG EC tablet Take 1 tablet (325 mg total) daily by mouth. 30 tablet 0  . atorvastatin (LIPITOR) 80 MG tablet Take 80 mg by mouth daily at 6 PM.    . buPROPion (WELLBUTRIN SR) 150 MG 12 hr tablet Take 150 mg by mouth 2 (two) times daily.    . carvedilol (COREG) 6.25 MG tablet Take 1 tablet (6.25 mg total) 2 (two) times daily with a meal by mouth. 60 tablet 0  . finasteride (PROSCAR) 5 MG tablet TAKE 1 TABLET (5 MG TOTAL) BY MOUTH DAILY. 90 tablet 1  . fluorometholone (FML) 0.1 % ophthalmic suspension Place 1 drop 3 (three) times daily into both eyes.  0  . losartan (COZAAR) 50 MG tablet Take 1 tablet (50 mg total) daily by mouth. 30 tablet 0  . omeprazole (PRILOSEC) 10 MG capsule TAKE ONE CAPSULE BY MOUTH EVERY DAY 90 capsule 3  . RESTASIS 0.05 % ophthalmic emulsion Place 1 drop 2 (two) times daily into both eyes.  4  . timolol (TIMOPTIC) 0.5 % ophthalmic solution Place 1 drop daily into both eyes.  0  . traZODone (DESYREL) 100 MG tablet Take 100 mg at bedtime by mouth.      No current facility-administered medications on file prior to visit.     Allergies:  No  Known Allergies  Physical Exam  Vitals:   11/07/17 1109  BP: (!) 132/58  Pulse: 88  Weight: 180 lb 6.4 oz (81.8 kg)  Height: 5\' 10"  (1.778 m)   Body mass index is 25.88 kg/m. No exam data present  General: well developed, elderly Caucasian male,  well nourished, seated, in no evident distress Head: head normocephalic and atraumatic.   Neck: supple with no carotid or supraclavicular bruits Cardiovascular: regular rate and  rhythm, no murmurs Musculoskeletal: no deformity Skin:  no rash/petichiae Vascular:  Normal pulses all extremities  Neurologic Exam Mental Status: Awake and fully alert. Oriented to place and time. Recent and remote memory intact. Attention span, concentration and fund of knowledge appropriate. Mood and affect appropriate.  Cranial Nerves: Fundoscopic exam reveals sharp disc margins. Pupils equal, briskly reactive to light. Extraocular movements full without nystagmus. Visual fields full to confrontation. Hearing intact. Facial sensation intact. Face, tongue, palate moves normally and symmetrically.  Motor: Normal bulk and tone. Normal strength in all tested extremity muscles. Sensory.: intact to touch , pinprick , position and vibratory sensation.  Coordination: Rapid alternating movements normal in all extremities. Finger-to-nose and heel-to-shin performed accurately bilaterally. Gait and Station: Arises from chair without difficulty. Stance is normal. Gait demonstrates normal stride length and balance . Able to heel, toe and tandem walk without difficulty.  Reflexes: 1+ and symmetric. Toes downgoing.    NIHSS  0 Modified Rankin  1   Diagnostic Data (Labs, Imaging, Testing)   Ct Angio Head & Neck W Or Wo Contrast 07/28/2017 IMPRESSION: No branch occlusion, stenosis, or visible embolic source. Atherosclerosis is mild for age.   Ct Head Wo Contrast 07/26/2017 IMPRESSION: No acute intracranial abnormalities. Chronic atrophy and small vessel ischemic changes.    Mr Brain Wo Contrast 07/27/2017 IMPRESSION: 1. Small acute infarct of the left caudate body. No hemorrhage or mass effect. 2. Chronic ischemic microangiopathy and generalized volume loss.   ECHO 07/27/17 Study Conclusions - Left ventricle: The cavity size was normal. Wall thickness was normal. Systolic function was moderately reduced. The estimated ejection fraction was in the range of 35% to 40%. Mild diffuse hypokinesis with no identifiable regional variations. Features are consistent with a pseudonormal left ventricular filling pattern, with concomitant abnormal relaxation and increased filling pressure (grade 2 diastolic dysfunction). - Mitral valve: There was mild regurgitation. - Left atrium: The atrium was mildly dilated. - Right atrium: The atrium was mildly dilated. - Pulmonary arteries: Systolic pressure was mildly increased. PA peak pressure: 34 mm Hg (S).  Nuclear Stress Test  07/29/17  There was no ST segment deviation noted during stress.  Findings consistent with prior moderate to large mid to distal anterior and apical myocardial infarction with moderate peri-infarct ischemia. Small basal septal infarct.  The left ventricular ejection fraction is moderately decreased (30-44%).  This is a high risk study. High risk based on decreased LVEF and moderate ischemia.  Nuclear stress EF: 32%.  TEE 07/31/17 No cardiac source of emboli was identified    ASSESSMENT: 77 y.o. year old male here with cryptogenic stroke.  Location consistent with small vessel disease but also possible cardioembolic source with low EF of 35-40% and frequent PACs and PVCs.  Vascular risk factors include hyperlipidemia, hypertension, and sleep apnea.   1. Cryptogenic stroke ([place])   2. Essential hypertension   3. Elevated lipoprotein(a)   4. Other depression   5. OSA (obstructive sleep apnea)   6. Ejection fraction < 50%     PLAN: I had a long d/w patient about his  recent stroke, risk for recurrent stroke/TIAs, personally independently reviewed imaging studies and stroke evaluation results and answered questions.Continue aspirin 325 mg daily  for secondary stroke prevention and maintain strict control of hypertension with blood pressure goal below 130/90, diabetes with hemoglobin A1c goal below 6.5% and lipids with LDL cholesterol goal below 70 mg/dL. I also advised the patient to eat a healthy diet with plenty of whole grains, cereals,  fruits and vegetables, exercise regularly and maintain ideal body weight Followup in the future with Janett Billow, NP in 3 months  -continue to be compliant with aspirin 325mg , and continue to take Lipitor 80 mg for secondary stroke prevention  -continue to follow loop recorder and we will notify you if an irregular heart beat is detected  -follow up with this office in 3 months and we will discuss clearance for knee surgery at that time  -please call earlier if needed or with any questions   Return in about 3 months (around 02/04/2018).  Greater than 50% of time during this 25 minute visit was spent on counseling,explanation of diagnosis, planning of further management, discussion with patient and family and coordination of care.   Rosalin Hawking, MD PhD Stroke Neurology 11/07/2017 6:00 PM

## 2017-11-08 ENCOUNTER — Ambulatory Visit (INDEPENDENT_AMBULATORY_CARE_PROVIDER_SITE_OTHER): Payer: PPO | Admitting: Psychology

## 2017-11-08 DIAGNOSIS — F341 Dysthymic disorder: Secondary | ICD-10-CM | POA: Diagnosis not present

## 2017-11-08 DIAGNOSIS — F411 Generalized anxiety disorder: Secondary | ICD-10-CM | POA: Diagnosis not present

## 2017-11-09 ENCOUNTER — Other Ambulatory Visit: Payer: Self-pay | Admitting: *Deleted

## 2017-11-09 ENCOUNTER — Encounter: Payer: Self-pay | Admitting: Internal Medicine

## 2017-11-09 MED ORDER — LOSARTAN POTASSIUM 50 MG PO TABS: 50.0000 mg | ORAL_TABLET | Freq: Every day | ORAL | 0 refills | Status: DC

## 2017-11-10 ENCOUNTER — Encounter: Payer: Self-pay | Admitting: Internal Medicine

## 2017-11-16 ENCOUNTER — Ambulatory Visit (INDEPENDENT_AMBULATORY_CARE_PROVIDER_SITE_OTHER): Payer: PPO | Admitting: Psychology

## 2017-11-16 DIAGNOSIS — F341 Dysthymic disorder: Secondary | ICD-10-CM | POA: Diagnosis not present

## 2017-11-16 DIAGNOSIS — F411 Generalized anxiety disorder: Secondary | ICD-10-CM | POA: Diagnosis not present

## 2017-11-21 ENCOUNTER — Ambulatory Visit (INDEPENDENT_AMBULATORY_CARE_PROVIDER_SITE_OTHER): Payer: PPO | Admitting: Psychology

## 2017-11-21 ENCOUNTER — Ambulatory Visit (INDEPENDENT_AMBULATORY_CARE_PROVIDER_SITE_OTHER): Payer: PPO | Admitting: Internal Medicine

## 2017-11-21 ENCOUNTER — Telehealth: Payer: Self-pay | Admitting: *Deleted

## 2017-11-21 ENCOUNTER — Encounter: Payer: Self-pay | Admitting: Internal Medicine

## 2017-11-21 VITALS — BP 134/72 | HR 66 | Ht 70.0 in | Wt 179.0 lb

## 2017-11-21 DIAGNOSIS — F411 Generalized anxiety disorder: Secondary | ICD-10-CM

## 2017-11-21 DIAGNOSIS — F341 Dysthymic disorder: Secondary | ICD-10-CM | POA: Diagnosis not present

## 2017-11-21 DIAGNOSIS — I639 Cerebral infarction, unspecified: Secondary | ICD-10-CM

## 2017-11-21 DIAGNOSIS — R0683 Snoring: Secondary | ICD-10-CM | POA: Diagnosis not present

## 2017-11-21 DIAGNOSIS — R400 Somnolence: Secondary | ICD-10-CM | POA: Diagnosis not present

## 2017-11-21 NOTE — Telephone Encounter (Signed)
-----   Message from Damian Leavell, RN sent at 11/21/2017 11:20 AM EST ----- Regarding: sleep study request Ordered split night study. Dr. Lovena Le would like Dr. Claiborne Billings to follow.  Sonia Baller

## 2017-11-21 NOTE — Patient Instructions (Addendum)
Medication Instructions:  Your physician recommends that you continue on your current medications as directed. Please refer to the Current Medication list given to you today.  Labwork: None ordered.  Testing/Procedures: Your physician has recommended that you have a sleep study. This test records several body functions during sleep, including: brain activity, eye movement, oxygen and carbon dioxide blood levels, heart rate and rhythm, breathing rate and rhythm, the flow of air through your mouth and nose, snoring, body muscle movements, and chest and belly movement.  Our sleep study assistance will be in contact with you after your insurance reviews our request.  Follow-Up: Your physician wants you to follow-up in: one year with Dr. Lovena Le.   You will receive a reminder letter in the mail two months in advance. If you don't receive a letter, please call our office to schedule the follow-up appointment.  Any Other Special Instructions Will Be Listed Below (If Applicable).  If you need a refill on your cardiac medications before your next appointment, please call your pharmacy.

## 2017-11-21 NOTE — Progress Notes (Signed)
HPI Jared [name] returns today for ongoing evaluation and management of syncope, s/p ILR insertion. In the interim, he has not had any dizzy spells and not passed out. He is not convinced he has ever passed out. He does admit to falling. He does not feel palpitations. He notes that he snores loudly (is told by his family) and has daytime fatigue and sleepiness.  No Known Allergies   Current Outpatient Medications  Medication Sig Dispense Refill  . ALPRAZolam (XANAX) 0.5 MG tablet Take 0.5 mg 3 (three) times daily by mouth.     Marland Kitchen amLODipine (NORVASC) 10 MG tablet TAKE 1 TABLET BY MOUTH EVERY DAY 90 tablet 0  . aspirin EC 325 MG EC tablet Take 1 tablet (325 mg total) daily by mouth. 30 tablet 0  . atorvastatin (LIPITOR) 80 MG tablet Take 80 mg by mouth daily at 6 PM.    . buPROPion (WELLBUTRIN SR) 150 MG 12 hr tablet Take 150 mg by mouth 2 (two) times daily.    . finasteride (PROSCAR) 5 MG tablet TAKE 1 TABLET (5 MG TOTAL) BY MOUTH DAILY. 90 tablet 1  . fluorometholone (FML) 0.1 % ophthalmic suspension Place 1 drop 3 (three) times daily into both eyes.  0  . losartan (COZAAR) 50 MG tablet Take 1 tablet (50 mg total) by mouth daily. 90 tablet 0  . omeprazole (PRILOSEC) 10 MG capsule TAKE ONE CAPSULE BY MOUTH EVERY DAY 90 capsule 3  . RESTASIS 0.05 % ophthalmic emulsion Place 1 drop 2 (two) times daily into both eyes.  4  . timolol (TIMOPTIC) 0.5 % ophthalmic solution Place 1 drop daily into both eyes.  0  . traZODone (DESYREL) 100 MG tablet Take 100 mg at bedtime by mouth.     . carvedilol (COREG) 6.25 MG tablet Take 1 tablet (6.25 mg total) 2 (two) times daily with a meal by mouth. (Patient not taking: Reported on 11/21/2017) 60 tablet 0   No current facility-administered medications for this visit.      Past Medical History:  Diagnosis Date  . ALLERGIC RHINITIS 10/05/2007  . ANXIETY 10/05/2007  . BENIGN PROSTATIC HYPERTROPHY 04/08/2009  . COLONIC POLYPS, HX OF 10/05/2007  . DEPRESSION  10/05/2007  . GERD 10/05/2007  . Heart murmur   . HYPERTENSION 10/05/2007  . NEPHROLITHIASIS, HX OF 10/05/2007  . SLEEP APNEA, OBSTRUCTIVE 10/02/2008  . Stroke ([place])     ROS:   All systems reviewed and negative except as noted in the HPI.   Past Surgical History:  Procedure Laterality Date  . APPENDECTOMY    . CATARACT EXTRACTION  2014   bilateral  . CHOLECYSTECTOMY    . FOOT SURGERY     left  . HERNIA REPAIR  2014  . LOOP RECORDER INSERTION N/A 07/31/2017   Procedure: LOOP RECORDER INSERTION;  Surgeon: Evans Lance, MD;  Location: [place] CV LAB;  Service: Cardiovascular;  Laterality: N/A;  . TEE WITHOUT CARDIOVERSION N/A 07/31/2017   Procedure: TRANSESOPHAGEAL ECHOCARDIOGRAM (TEE) WITH LOOP;  Surgeon: Dorothy Spark, MD;  Location: [facility] ENDOSCOPY;  Service: Cardiovascular;  Laterality: N/A;  . WRIST FRACTURE SURGERY     right x2   left x1     Family History  Problem Relation Age of Onset  . Colon cancer Maternal Aunt 70     Social History   Socioeconomic History  . Marital status: Widowed    Spouse name: Not on file  . Number of children: Not on  file  . Years of education: Not on file  . Highest education level: Not on file  Social Needs  . Financial resource strain: Not on file  . Food insecurity - worry: Not on file  . Food insecurity - inability: Not on file  . Transportation needs - medical: Not on file  . Transportation needs - non-medical: Not on file  Occupational History  . Not on file  Tobacco Use  . Smoking status: Never Smoker  . Smokeless tobacco: Never Used  Substance and Sexual Activity  . Alcohol use: No  . Drug use: No  . Sexual activity: Not on file  Other Topics Concern  . Not on file  Social History Narrative  . Not on file     BP 134/72   Pulse 66   Ht 5\' 10"  (1.778 m)   Wt 179 lb (81.2 kg)   BMI 25.68 kg/m   Physical Exam:  Well appearing NAD HEENT: Unremarkable Neck:  No JVD, no thyromegally Lymphatics:  No  adenopathy Back:  No CVA tenderness Lungs:  Clear with no wheezes HEART:  Regular rate rhythm, no murmurs, no rubs, no clicks Abd:  soft, positive bowel sounds, no organomegally, no rebound, no guarding Ext:  2 plus pulses, no edema, no cyanosis, no clubbing Skin:  No rashes no nodules Neuro:  CN II through XII intact, motor grossly intact  EKG - NSR with PACS  DEVICE  Normal device function.  See PaceArt for details. ILR demonstrates episodes of ectopy which I cannot exclude to be due to atrial fib vs WAP.   Assess/Plan: 1. Near syncope/syncope - he has had no pauses.  2. Atrial arrhythmias - unclear if this represents atrial fib. The episodes are not long and are asymptomatic. She will undergo watchful waiting. 3. Probable sleep apnea - he tried to do a sleep study many years ago during the daytime. I suspect his has sleep apnea and recommend a formal sleep study.   Mikle Bosworth.D.

## 2017-11-21 NOTE — Telephone Encounter (Signed)
TO WANDA WADDELL: Dr Caryl Comes is sending this patient to Dr Claiborne Billings to have a sleep study and to follow his sleep therapy. Josefa Half

## 2017-11-22 LAB — CUP PACEART INCLINIC DEVICE CHECK
Implantable Pulse Generator Implant Date: 20181112
MDC IDC SESS DTM: 20190305160410

## 2017-11-22 LAB — CUP PACEART REMOTE DEVICE CHECK
Date Time Interrogation Session: 20190210230635
Implantable Pulse Generator Implant Date: 20181112

## 2017-11-23 ENCOUNTER — Other Ambulatory Visit: Payer: Self-pay | Admitting: Internal Medicine

## 2017-11-28 DIAGNOSIS — L820 Inflamed seborrheic keratosis: Secondary | ICD-10-CM | POA: Diagnosis not present

## 2017-11-28 DIAGNOSIS — L821 Other seborrheic keratosis: Secondary | ICD-10-CM | POA: Diagnosis not present

## 2017-11-28 DIAGNOSIS — D225 Melanocytic nevi of trunk: Secondary | ICD-10-CM | POA: Diagnosis not present

## 2017-11-28 DIAGNOSIS — L814 Other melanin hyperpigmentation: Secondary | ICD-10-CM | POA: Diagnosis not present

## 2017-11-28 DIAGNOSIS — Z85828 Personal history of other malignant neoplasm of skin: Secondary | ICD-10-CM | POA: Diagnosis not present

## 2017-11-28 DIAGNOSIS — L570 Actinic keratosis: Secondary | ICD-10-CM | POA: Diagnosis not present

## 2017-11-28 DIAGNOSIS — L0291 Cutaneous abscess, unspecified: Secondary | ICD-10-CM | POA: Diagnosis not present

## 2017-11-30 DIAGNOSIS — L0291 Cutaneous abscess, unspecified: Secondary | ICD-10-CM | POA: Diagnosis not present

## 2017-12-01 ENCOUNTER — Ambulatory Visit (INDEPENDENT_AMBULATORY_CARE_PROVIDER_SITE_OTHER): Payer: PPO | Admitting: *Deleted

## 2017-12-01 DIAGNOSIS — I639 Cerebral infarction, unspecified: Secondary | ICD-10-CM

## 2017-12-04 NOTE — Progress Notes (Signed)
Carelink Summary Report / Loop Recorder 

## 2017-12-05 ENCOUNTER — Telehealth: Payer: Self-pay | Admitting: *Deleted

## 2017-12-06 NOTE — Telephone Encounter (Signed)
Patient notified of sleep study appointment scheduled 12/20/17 @ [place].

## 2017-12-08 ENCOUNTER — Other Ambulatory Visit: Payer: Self-pay

## 2017-12-08 ENCOUNTER — Telehealth: Payer: Self-pay

## 2017-12-08 MED ORDER — AMLODIPINE BESYLATE 10 MG PO TABS: 10.0000 mg | ORAL_TABLET | Freq: Every day | ORAL | 0 refills | Status: DC

## 2017-12-08 NOTE — Telephone Encounter (Signed)
Left a detailed message for patient to call back and schedule an office visit for more refills.

## 2017-12-12 ENCOUNTER — Telehealth: Payer: Self-pay | Admitting: Cardiology

## 2017-12-12 NOTE — Telephone Encounter (Signed)
Spoke w/ pt and requested that he send a manual transmission b/c his home monitor has not updated in at least 14 days.   

## 2017-12-14 ENCOUNTER — Telehealth: Payer: Self-pay | Admitting: *Deleted

## 2017-12-14 NOTE — Telephone Encounter (Signed)
Spoke with patient to schedule Device Clinic appointment for [facility] reprogramming.  Patient is agreeable to appointment on 12/19/17 at 11:30am.  He is aware of office address and denies additional questions or concerns at this time.

## 2017-12-19 ENCOUNTER — Ambulatory Visit (INDEPENDENT_AMBULATORY_CARE_PROVIDER_SITE_OTHER): Payer: PPO | Admitting: Internal Medicine

## 2017-12-19 ENCOUNTER — Ambulatory Visit (INDEPENDENT_AMBULATORY_CARE_PROVIDER_SITE_OTHER): Payer: Self-pay | Admitting: *Deleted

## 2017-12-19 ENCOUNTER — Encounter: Payer: Self-pay | Admitting: Internal Medicine

## 2017-12-19 VITALS — BP 122/58 | HR 88 | Temp 98.2°F | Wt 184.0 lb

## 2017-12-19 DIAGNOSIS — I639 Cerebral infarction, unspecified: Secondary | ICD-10-CM

## 2017-12-19 DIAGNOSIS — R42 Dizziness and giddiness: Secondary | ICD-10-CM | POA: Diagnosis not present

## 2017-12-19 DIAGNOSIS — I493 Ventricular premature depolarization: Secondary | ICD-10-CM

## 2017-12-19 DIAGNOSIS — I10 Essential (primary) hypertension: Secondary | ICD-10-CM | POA: Diagnosis not present

## 2017-12-19 LAB — CUP PACEART INCLINIC DEVICE CHECK
Implantable Pulse Generator Implant Date: 20181112
MDC IDC SESS DTM: 20190402123604

## 2017-12-19 NOTE — Patient Instructions (Signed)
Limit your sodium (Salt) intake  Please check your blood pressure on a regular basis.  If it is consistently greater than 150/90, please make an office appointment.    It is important that you exercise regularly, at least 20 minutes 3 to 4 times per week.  If you develop chest pain or shortness of breath seek  medical attention.  Return in 6 months for follow-up  

## 2017-12-19 NOTE — Progress Notes (Signed)
Loop check in clinic. Battery status: good. R-waves 0.30mV. No symptom or tachy episodes. Pause and brady detection originally programmed off at implant (due to cryptogenic stroke indication), but reprogrammed on today. Pause detection at 4.5sec, brady detection at 12bts. 2 AF episodes--false, ECGs appear ?MAT with PVCs. Monthly summary reports and ROV with GT in 11/2018.

## 2017-12-19 NOTE — Progress Notes (Signed)
Subjective:    Patient ID: Jared [name], male    DOB: October 28, 1940, 77 y.o.   MRN: [id number]  HPI  77 year old patient who is seen today for follow-up.Marland Kitchen  He is status post cryptogenic stroke in November of last year.  MRI showed a small acute infarct in the left caudate body.  A loop recorder is still in place. He is doing well.  He still has some episodes of dizziness especially when he stoops over.  He is complaining of knee pain that limits his activity somewhat.  He is open to have elective knee surgery later this year He is followed by cardiology and does have a history of systolic dysfunction with EF of 35 to 40%.  EKGs in the past have revealed frequent PACs  He lives alone but does are quite well.  He has become a bit more active with yard work  Past Medical History:  Diagnosis Date  . ALLERGIC RHINITIS 10/05/2007  . ANXIETY 10/05/2007  . BENIGN PROSTATIC HYPERTROPHY 04/08/2009  . COLONIC POLYPS, HX OF 10/05/2007  . DEPRESSION 10/05/2007  . GERD 10/05/2007  . Heart murmur   . HYPERTENSION 10/05/2007  . NEPHROLITHIASIS, HX OF 10/05/2007  . SLEEP APNEA, OBSTRUCTIVE 10/02/2008  . Stroke [facility])      Social History   Socioeconomic History  . Marital status: Widowed    Spouse name: Not on file  . Number of children: Not on file  . Years of education: Not on file  . Highest education level: Not on file  Occupational History  . Not on file  Social Needs  . Financial resource strain: Not on file  . Food insecurity:    Worry: Not on file    Inability: Not on file  . Transportation needs:    Medical: Not on file    Non-medical: Not on file  Tobacco Use  . Smoking status: Never Smoker  . Smokeless tobacco: Never Used  Substance and Sexual Activity  . Alcohol use: No  . Drug use: No  . Sexual activity: Not on file  Lifestyle  . Physical activity:    Days per week: Not on file    Minutes per session: Not on file  . Stress: Not on file  Relationships  . Social connections:    Talks on phone: Not on file    Gets together: Not on file    Attends religious service: Not on file    Active member of club or organization: Not on file    Attends meetings of clubs or organizations: Not on file    Relationship status: Not on file  . Intimate partner violence:    Fear of current or ex partner: Not on file    Emotionally abused: Not on file    Physically abused: Not on file    Forced sexual activity: Not on file  Other Topics Concern  . Not on file  Social History Narrative  . Not on file    Past Surgical History:  Procedure Laterality Date  . APPENDECTOMY    . CATARACT EXTRACTION  2014   bilateral  . CHOLECYSTECTOMY    . FOOT SURGERY     left  . HERNIA REPAIR  2014  . LOOP RECORDER INSERTION N/A 07/31/2017   Procedure: LOOP RECORDER INSERTION;  Surgeon: Evans Lance, MD;  Location: [facility];  Service: Cardiovascular;  Laterality: N/A;  . TEE WITHOUT CARDIOVERSION N/A 07/31/2017   Procedure: TRANSESOPHAGEAL ECHOCARDIOGRAM (TEE) WITH LOOP;  Surgeon: Dorothy Spark, MD;  Location: [facility];  Service: Cardiovascular;  Laterality: N/A;  . WRIST FRACTURE SURGERY     right x2   left x1    Family History  Problem Relation Age of Onset  . Colon cancer Maternal Aunt 70    No Known Allergies  Current Outpatient Medications on File Prior to Visit  Medication Sig Dispense Refill  . ALPRAZolam (XANAX) 0.5 MG tablet Take 0.5 mg 3 (three) times daily by mouth.     Marland Kitchen amLODipine (NORVASC) 10 MG tablet Take 1 tablet (10 mg total) by mouth daily. 30 tablet 0  . aspirin EC 325 MG EC tablet Take 1 tablet (325 mg total) daily by mouth. 30 tablet 0  . atorvastatin (LIPITOR) 80 MG tablet Take 80 mg by mouth daily at 6 PM.    . buPROPion (WELLBUTRIN SR) 150 MG 12 hr tablet Take 150 mg by mouth 2 (two) times daily.    . carvedilol (COREG) 6.25 MG tablet Take 1 tablet (6.25 mg total) 2 (two) times daily with a meal by mouth. 60 tablet 0  . finasteride  (PROSCAR) 5 MG tablet TAKE 1 TABLET (5 MG TOTAL) BY MOUTH DAILY. 90 tablet 1  . fluorometholone (FML) 0.1 % ophthalmic suspension Place 1 drop 3 (three) times daily into both eyes.  0  . losartan (COZAAR) 50 MG tablet Take 1 tablet (50 mg total) by mouth daily. 90 tablet 0  . omeprazole (PRILOSEC) 10 MG capsule TAKE ONE CAPSULE BY MOUTH EVERY DAY 90 capsule 3  . RESTASIS 0.05 % ophthalmic emulsion Place 1 drop 2 (two) times daily into both eyes.  4  . timolol (TIMOPTIC) 0.5 % ophthalmic solution Place 1 drop daily into both eyes.  0  . traZODone (DESYREL) 100 MG tablet Take 100 mg at bedtime by mouth.      No current facility-administered medications on file prior to visit.     BP (!) 122/58 (BP Location: Right Arm, Patient Position: Sitting, Cuff Size: Normal)   Pulse 88   Temp 98.2 F (36.8 C) (Oral)   Wt 184 lb (83.5 kg)   SpO2 96%   BMI 26.40 kg/m     Review of Systems  Constitutional: Positive for fatigue. Negative for appetite change, chills and fever.  HENT: Negative for congestion, dental problem, ear pain, hearing loss, sore throat, tinnitus, trouble swallowing and voice change.   Eyes: Negative for pain, discharge and visual disturbance.  Respiratory: Negative for cough, chest tightness, wheezing and stridor.   Cardiovascular: Negative for chest pain, palpitations and leg swelling.  Gastrointestinal: Negative for abdominal distention, abdominal pain, blood in stool, constipation, diarrhea, nausea and vomiting.  Genitourinary: Negative for difficulty urinating, discharge, flank pain, genital sores, hematuria and urgency.  Musculoskeletal: Negative for arthralgias, back pain, gait problem, joint swelling, myalgias and neck stiffness.  Skin: Negative for rash.  Neurological: Positive for light-headedness. Negative for dizziness, syncope, speech difficulty, weakness, numbness and headaches.  Hematological: Negative for adenopathy. Does not bruise/bleed easily.    Psychiatric/Behavioral: Negative for behavioral problems and dysphoric mood. The patient is not nervous/anxious.        Objective:   Physical Exam  Constitutional: He is oriented to person, place, and time. He appears well-developed.  No orthostatic blood pressure changes  HENT:  Head: Normocephalic.  Right Ear: External ear normal.  Left Ear: External ear normal.  Eyes: Conjunctivae and EOM are normal.  Neck: Normal range of motion.  Cardiovascular: Normal rate and  normal heart sounds.  Frequent ectopics  Pulmonary/Chest: Breath sounds normal.  Abdominal: Bowel sounds are normal.  Musculoskeletal: Normal range of motion. He exhibits no edema or tenderness.  Neurological: He is alert and oriented to person, place, and time. No cranial nerve deficit.  Normal finger-to-nose testing  Psychiatric: He has a normal mood and affect. His behavior is normal.          Assessment & Plan:   History of cryptogenic stroke.  Cardiology and neurology follow-up Essential hypertension stable History of frequent PACs  left ventricular dysfunction   No change in medical regimen Follow-up 6 months or as needed  Nyoka Cowden

## 2017-12-20 ENCOUNTER — Ambulatory Visit (HOSPITAL_BASED_OUTPATIENT_CLINIC_OR_DEPARTMENT_OTHER): Payer: PPO | Attending: Internal Medicine | Admitting: Cardiology

## 2017-12-20 VITALS — Ht 70.0 in | Wt 184.0 lb

## 2017-12-20 DIAGNOSIS — I491 Atrial premature depolarization: Secondary | ICD-10-CM | POA: Insufficient documentation

## 2017-12-20 DIAGNOSIS — R5383 Other fatigue: Secondary | ICD-10-CM | POA: Diagnosis not present

## 2017-12-20 DIAGNOSIS — R400 Somnolence: Secondary | ICD-10-CM

## 2017-12-20 DIAGNOSIS — I10 Essential (primary) hypertension: Secondary | ICD-10-CM | POA: Diagnosis not present

## 2017-12-20 DIAGNOSIS — R0683 Snoring: Secondary | ICD-10-CM | POA: Diagnosis not present

## 2017-12-20 DIAGNOSIS — Z79899 Other long term (current) drug therapy: Secondary | ICD-10-CM | POA: Insufficient documentation

## 2017-12-20 DIAGNOSIS — I493 Ventricular premature depolarization: Secondary | ICD-10-CM | POA: Diagnosis not present

## 2017-12-20 DIAGNOSIS — G4733 Obstructive sleep apnea (adult) (pediatric): Secondary | ICD-10-CM | POA: Insufficient documentation

## 2017-12-20 DIAGNOSIS — G4719 Other hypersomnia: Secondary | ICD-10-CM | POA: Diagnosis present

## 2017-12-23 NOTE — Procedures (Signed)
   NAME: Jared [name] DATE OF BIRTH:  [birth year] MEDICAL RECORD NUMBER [id number]  LOCATION: [facility]  PHYSICIAN: Traci Turner  DATE OF STUDY: 12/20/2017  SLEEP STUDY TYPE: Nocturnal Polysomnogram               REFERRING PHYSICIAN: Evans Lance, MD   Gender: Male D.O.B: 04/03/1941 Age (years): 73 Referring Provider: [name] Height (inches): 27 Interpreting Physician: Fransico Him MD, ABSM Weight (lbs): 184 RPSGT: Carolin Coy BMI: 26 MRN: [id number] Neck Size: 17.00  CLINICAL INFORMATION Sleep Study Type: NPSG  Indication for sleep study: Excessive Daytime Sleepiness, Fatigue, Hypertension, Snoring  Epworth Sleepiness Score: 3  Most recent polysomnogram dated 03/04/2013 revealed an AHI of 14.5/h.  SLEEP STUDY TECHNIQUE As per the AASM Manual for the Scoring of Sleep and Associated Events v2.3 (April 2016) with a hypopnea requiring 4% desaturations.  The channels recorded and monitored were frontal, central and occipital EEG, electrooculogram (EOG), submentalis EMG (chin), nasal and oral airflow, thoracic and abdominal wall motion, anterior tibialis EMG, snore microphone, electrocardiogram, and pulse oximetry.  MEDICATIONS Medications self-administered by patient taken the night of the study : XANAX, Walker The study was initiated at 11:19:04 PM and ended at 5:19:44 AM.  Sleep onset time was 74.8 minutes and the sleep efficiency was 62.8%%. The total sleep time was 226.5 minutes.  Stage REM latency was 125.5 minutes.  The patient spent 7.5%% of the night in stage N1 sleep, 80.4%% in stage N2 sleep, 0.0%% in stage N3 and 12.14% in REM.  Alpha intrusion was absent.  Supine sleep was 76.36%.  RESPIRATORY PARAMETERS The overall apnea/hypopnea index (AHI) was 10.1 per hour. There were 0 total apneas, including 0 obstructive, 0 central and 0 mixed apneas. There were 38 hypopneas and 10 RERAs.  The AHI during Stage  REM sleep was 26.2 per hour.  AHI while supine was 13.2 per hour.  The mean oxygen saturation was 93.0%. The minimum SpO2 during sleep was 86.0%.  soft snoring was noted during this study.  CARDIAC DATA The 2 lead EKG demonstrated sinus rhythm. The mean heart rate was 56.7 beats per minute. Other EKG findings include: PVCs and PACs.  LEG MOVEMENT DATA The total PLMS were 0 with a resulting PLMS index of 0.0. Associated arousal with leg movement index was 0.0 .  IMPRESSIONS - Mild obstructive sleep apnea occurred during this study (AHI = 10.1/h). - No significant central sleep apnea occurred during this study (CAI = 0.0/h). - Mild oxygen desaturation was noted during this study (Min O2 = 86.0%). - The patient snored with soft snoring volume. - EKG findings include PVCs and PACs. - Clinically significant periodic limb movements did not occur during sleep. No significant associated arousals.  DIAGNOSIS - Obstructive Sleep Apnea (327.23 [G47.33 ICD-10]) - PACs - PVCs  RECOMMENDATIONS - Therapeutic CPAP titration to determine optimal pressure required to alleviate sleep disordered breathing. - Positional therapy avoiding supine position during sleep. - Avoid alcohol, sedatives and other CNS depressants that may worsen sleep apnea and disrupt normal sleep architecture. - Sleep hygiene should be reviewed to assess factors that may improve sleep quality. - Weight management and regular exercise should be initiated or continued if appropriate.  [place], American Board of Sleep Medicine  ELECTRONICALLY SIGNED ON:  12/23/2017, 11:52 PM [place] PH: [phone]   FX: [phone] [place]

## 2017-12-25 ENCOUNTER — Other Ambulatory Visit: Payer: Self-pay | Admitting: Cardiovascular Disease

## 2017-12-25 DIAGNOSIS — G4733 Obstructive sleep apnea (adult) (pediatric): Secondary | ICD-10-CM

## 2017-12-25 NOTE — Telephone Encounter (Signed)
  Jared Margarita, MD  [name], CMA        Please let patient know that they have sleep apnea and recommend CPAP titration. Please set up titration in the sleep lab.

## 2017-12-25 NOTE — Telephone Encounter (Signed)
Informed patient of sleep study results and patient understanding was verbalized. Patient understands his sleep study showed he has sleep apnea and Dr Radford Pax recommends a CPAP titration. Patient is scheduled for lab study on  Patient understands his sleep study will be done at [facility] sleep lab. Patient understands he will receive a sleep packet in a week or so. Patient understands to call if he does not receive the sleep packet in a timely manner. Patient agrees with treatment and thanked me for call.  CPAP Titration sent to pre cert

## 2017-12-27 ENCOUNTER — Telehealth: Payer: Self-pay | Admitting: Cardiology

## 2017-12-27 NOTE — Telephone Encounter (Signed)
Spoke w/ pt and requested that he send a manual transmission b/c his home monitor has not updated in at least 14 days.   

## 2018-01-01 ENCOUNTER — Telehealth: Payer: Self-pay | Admitting: *Deleted

## 2018-01-01 ENCOUNTER — Ambulatory Visit (INDEPENDENT_AMBULATORY_CARE_PROVIDER_SITE_OTHER): Payer: PPO | Admitting: Psychology

## 2018-01-01 DIAGNOSIS — F411 Generalized anxiety disorder: Secondary | ICD-10-CM

## 2018-01-01 DIAGNOSIS — F341 Dysthymic disorder: Secondary | ICD-10-CM

## 2018-01-01 NOTE — Telephone Encounter (Signed)
Called patient and notified him of sleep study appointment scheduled for 01/19/18.

## 2018-01-01 NOTE — Telephone Encounter (Signed)
-----   Message from [name], [place] sent at 12/25/2017  2:46 PM EDT ----- Regarding: pre cert have sleep apnea and recommend CPAP titration Dr. Lovena Le would like Dr. Claiborne Billings to follow.  Jared [name]

## 2018-01-03 ENCOUNTER — Ambulatory Visit (INDEPENDENT_AMBULATORY_CARE_PROVIDER_SITE_OTHER): Payer: PPO | Admitting: *Deleted

## 2018-01-03 DIAGNOSIS — I639 Cerebral infarction, unspecified: Secondary | ICD-10-CM

## 2018-01-04 NOTE — Progress Notes (Signed)
Carelink Summary Report / Loop Recorder 

## 2018-01-06 LAB — CUP PACEART REMOTE DEVICE CHECK
Date Time Interrogation Session: 20190316001110
Implantable Pulse Generator Implant Date: 20181112

## 2018-01-08 ENCOUNTER — Other Ambulatory Visit: Payer: Self-pay | Admitting: Internal Medicine

## 2018-01-16 ENCOUNTER — Other Ambulatory Visit: Payer: Self-pay | Admitting: Internal Medicine

## 2018-01-19 ENCOUNTER — Ambulatory Visit (HOSPITAL_BASED_OUTPATIENT_CLINIC_OR_DEPARTMENT_OTHER): Payer: PPO | Attending: Cardiovascular Disease | Admitting: Cardiovascular Disease

## 2018-01-19 VITALS — Ht 73.0 in | Wt 184.0 lb

## 2018-01-19 DIAGNOSIS — Z79899 Other long term (current) drug therapy: Secondary | ICD-10-CM | POA: Insufficient documentation

## 2018-01-19 DIAGNOSIS — G4733 Obstructive sleep apnea (adult) (pediatric): Secondary | ICD-10-CM | POA: Insufficient documentation

## 2018-01-19 DIAGNOSIS — Z7982 Long term (current) use of aspirin: Secondary | ICD-10-CM | POA: Diagnosis not present

## 2018-01-22 ENCOUNTER — Ambulatory Visit: Payer: PPO | Admitting: Internal Medicine

## 2018-01-25 ENCOUNTER — Other Ambulatory Visit: Payer: Self-pay | Admitting: Internal Medicine

## 2018-01-29 ENCOUNTER — Telehealth: Payer: Self-pay | Admitting: Cardiovascular Disease

## 2018-01-29 NOTE — Telephone Encounter (Signed)
New Message   Pt calling to check on his sleep study. Please call

## 2018-01-29 NOTE — Telephone Encounter (Signed)
Returned a call to patient informing him Dr Claiborne Billings hasn't completed his sleep study yet. Once he has read it and given me instructions I will contact him with the information.

## 2018-01-30 ENCOUNTER — Other Ambulatory Visit: Payer: Self-pay | Admitting: Internal Medicine

## 2018-01-31 ENCOUNTER — Encounter: Payer: Self-pay | Admitting: Adult Health

## 2018-01-31 ENCOUNTER — Ambulatory Visit (INDEPENDENT_AMBULATORY_CARE_PROVIDER_SITE_OTHER): Payer: PPO | Admitting: Adult Health

## 2018-01-31 VITALS — BP 143/80 | HR 63 | Ht 70.0 in | Wt 187.6 lb

## 2018-01-31 DIAGNOSIS — G4733 Obstructive sleep apnea (adult) (pediatric): Secondary | ICD-10-CM

## 2018-01-31 DIAGNOSIS — E785 Hyperlipidemia, unspecified: Secondary | ICD-10-CM

## 2018-01-31 DIAGNOSIS — I639 Cerebral infarction, unspecified: Secondary | ICD-10-CM | POA: Diagnosis not present

## 2018-01-31 DIAGNOSIS — I10 Essential (primary) hypertension: Secondary | ICD-10-CM | POA: Diagnosis not present

## 2018-01-31 NOTE — Progress Notes (Signed)
I reviewed above note and agree with the assessment and plan.   Rosalin Hawking, MD PhD Stroke Neurology 01/31/2018 6:16 PM

## 2018-01-31 NOTE — Patient Instructions (Signed)
Continue aspirin 325 mg daily  and lipitor  for secondary stroke prevention  Continue to follow up with PCP regarding cholesterol and blood pressure management   Follow up on sleep apnea results  We will continue to monitor loop recorder  Follow up with psychiatrist regarding depression concerns - printed out a list of [facility]   Maintain strict control of hypertension with blood pressure goal below 130/90, diabetes with hemoglobin A1c goal below 6.5% and cholesterol with LDL cholesterol (bad cholesterol) goal below 70 mg/dL. I also advised the patient to eat a healthy diet with plenty of whole grains, cereals, fruits and vegetables, exercise regularly and maintain ideal body weight.  Followup in the future with me in 6 months or call earlier if needed       Thank you for coming to see [place] at [facility]. I hope we have been able to provide you high quality care today.  You may receive a patient satisfaction survey over the next few weeks. We would appreciate your feedback and comments so that we may continue to improve ourselves and the health of our patients.

## 2018-01-31 NOTE — Progress Notes (Signed)
[facility] [address] [place]. [place] [address] [phone]       OFFICE FOLLOW UP NOTE  Jared [name] Date of Birth:  Feb 17, 1941 Medical Record Number:  [id number]   Reason for Referral:  Hospital stroke follow up  CHIEF COMPLAINT:  Chief Complaint  Patient presents with  . Follow-up    Stroke follow up, pt in room 9 alone    HPI: Jared [name] is being seen today in the office for cryptogenic stroke on 07/26/17. History obtained from patient and chart review. Reviewed all radiology images and labs personally. Jared [name] is a 77 y.o. male with PMH of HTN, OSA, BPH admitted for episode of dizziness and fall at home on 07/26/17.  CT of head showed no acute intracranial abnormalities.  CTA of head and neck showed no branch occlusion, stenosis, or visible embolic source.  MRI did show a small acute infarct in the left caudate body.  2D echo showed an EF of 35-40%.  LDL 102.  HbA1c 5.3.  Patient had no antithrombotic prior to admission and was discharged on aspirin 325 mg daily.  Due to possible cardioembolic source with a low EF of 35-40%, TEE was done which was negative for cardioembolic source and a loop recorder was placed.  Patient was started on 40 mg daily of Lipitor.  Patient discharged home in stable condition with recommendation of home PT/OT.   11/07/17 Visit: Since discharge, patient states he has been doing well.  Patient continues to take aspirin 325 mg and denies increase in bleeding and bruising.  Patient was increased from Lipitor 40 mg to 80 mg by PCP.  He is tolerating this well without myalgias.  Patient does have history of OSA but unable to tolerate mask.  Loop recorder still in place without evidence of atrial fibrillation episodes at this time.  Patient states he does get slightly dizzy with bending over but denies recent falls.  Patient did have home PT for 3-4 sessions but states this was not helpful for him.  Patient also states he has mild  memory loss where he may place something somewhere and forget about it.  Patient does currently live alone but has a daughter in the area.  Patient is questioning whether he can have right knee arthroscopy.  As this is an elective procedure and not emergent, patient was advised to wait another 3 months as that will make it a total of 6 months post stroke.  Patient in agreement to this.  Patient denies new or worsening stroke/TIA symptoms.  01/31/18 UPDATE: Patient returns today for 22-month follow-up visit.  From a stroke standpoint he is doing well.  He continues to take aspirin without side effects of bleeding or bruising.  Continues to take Lipitor without side effects of myalgias.  Blood pressure at today's visit satisfactory at 143/80.  Patient has recently undergone sleep study on 12/20/2017 and was found to have sleep apnea.  Patient had split sleep study on 01/19/2018 but has not obtained results for this yet.  Loop recorder has not found atrial fibrillation thus far.  Patient does have concerns for worsening depression over the past several years.  He is currently on Wellbutrin which is prescribed by psychiatrist.  He has a difficult time getting motivated to be active/social and to work on needed things around the house.  Patient does live on his own.  He has not followed up with psychiatrist recently and this was highly recommended for possible  change in depression management.  Patient is in agreement to this plan.  He does not feel as though the depression has gotten worse since his stroke but just has slowly been getting worse over the past several years.  Patient denies suicidal ideation or past suicide attempts.  He does not sleep well at night and feels tired throughout most of the day and this could be leading to part of his depression and complains of memory loss.  Patient complains of age-related memory loss such as today where his appointment was originally scheduled for 02/05/2018 and he saw this on  the calendar but patient still came to appointment thinking it was today (01/31/2018).  Explained to patient that it will be important for him to obtain results from sleep study if he does need CPAP machine in order to get better rest at night which can help improve both his depression and memory loss.  Also advised patient the importance of following up with psychiatrist for possible change in depression management.  Patient denies new or worsening stroke/TIA symptoms.    ROS:   14 system review of systems performed and negative with exception of activity change, ringing in ears, constipation, apnea, snoring, frequency and urination, joint pain, depression and nervous/anxious  PMH:  Past Medical History:  Diagnosis Date  . ALLERGIC RHINITIS 10/05/2007  . ANXIETY 10/05/2007  . BENIGN PROSTATIC HYPERTROPHY 04/08/2009  . COLONIC POLYPS, HX OF 10/05/2007  . DEPRESSION 10/05/2007  . GERD 10/05/2007  . Heart murmur   . HYPERTENSION 10/05/2007  . NEPHROLITHIASIS, HX OF 10/05/2007  . SLEEP APNEA, OBSTRUCTIVE 10/02/2008  . Stroke [facility])     PSH:  Past Surgical History:  Procedure Laterality Date  . APPENDECTOMY    . CATARACT EXTRACTION  2014   bilateral  . CHOLECYSTECTOMY    . FOOT SURGERY     left  . HERNIA REPAIR  2014  . LOOP RECORDER INSERTION N/A 07/31/2017   Procedure: LOOP RECORDER INSERTION;  Surgeon: Evans Lance, MD;  Location: [place] [facility];  Service: Cardiovascular;  Laterality: N/A;  . TEE WITHOUT CARDIOVERSION N/A 07/31/2017   Procedure: TRANSESOPHAGEAL ECHOCARDIOGRAM (TEE) WITH LOOP;  Surgeon: Dorothy Spark, MD;  Location: [place];  Service: Cardiovascular;  Laterality: N/A;  . WRIST FRACTURE SURGERY     right x2   left x1    Social History:  Social History   Socioeconomic History  . Marital status: Widowed    Spouse name: Not on file  . Number of children: Not on file  . Years of education: Not on file  . Highest education level: Not on file    Occupational History  . Not on file  Social Needs  . Financial resource strain: Not on file  . Food insecurity:    Worry: Not on file    Inability: Not on file  . Transportation needs:    Medical: Not on file    Non-medical: Not on file  Tobacco Use  . Smoking status: Never Smoker  . Smokeless tobacco: Never Used  Substance and Sexual Activity  . Alcohol use: No  . Drug use: No  . Sexual activity: Not on file  Lifestyle  . Physical activity:    Days per week: Not on file    Minutes per session: Not on file  . Stress: Not on file  Relationships  . Social connections:    Talks on phone: Not on file    Gets together: Not on file  Attends religious service: Not on file    Active member of club or organization: Not on file    Attends meetings of clubs or organizations: Not on file    Relationship status: Not on file  . Intimate partner violence:    Fear of current or ex partner: Not on file    Emotionally abused: Not on file    Physically abused: Not on file    Forced sexual activity: Not on file  Other Topics Concern  . Not on file  Social History Narrative  . Not on file    Family History:  Family History  Problem Relation Age of Onset  . Colon cancer Maternal Aunt 70    Medications:   Current Outpatient Medications on File Prior to Visit  Medication Sig Dispense Refill  . ALPRAZolam (XANAX) 0.5 MG tablet Take 0.5 mg 3 (three) times daily by mouth.     Marland Kitchen amLODipine (NORVASC) 10 MG tablet TAKE 1 TABLET BY MOUTH EVERY DAY 30 tablet 0  . aspirin EC 325 MG EC tablet Take 1 tablet (325 mg total) daily by mouth. 30 tablet 0  . atorvastatin (LIPITOR) 80 MG tablet Take 80 mg by mouth daily at 6 PM.    . buPROPion (WELLBUTRIN SR) 150 MG 12 hr tablet Take 150 mg by mouth 2 (two) times daily.    . carvedilol (COREG) 6.25 MG tablet Take 1 tablet (6.25 mg total) 2 (two) times daily with a meal by mouth. 60 tablet 0  . finasteride (PROSCAR) 5 MG tablet TAKE 1 TABLET (5 MG  TOTAL) BY MOUTH DAILY. 90 tablet 1  . fluorometholone (FML) 0.1 % ophthalmic suspension Place 1 drop 3 (three) times daily into both eyes.  0  . losartan (COZAAR) 50 MG tablet TAKE 1 TABLET BY MOUTH EVERY DAY 90 tablet 0  . omeprazole (PRILOSEC) 10 MG capsule TAKE ONE CAPSULE BY MOUTH EVERY DAY 90 capsule 3  . RESTASIS 0.05 % ophthalmic emulsion Place 1 drop 2 (two) times daily into both eyes.  4  . timolol (TIMOPTIC) 0.5 % ophthalmic solution Place 1 drop daily into both eyes.  0  . traZODone (DESYREL) 100 MG tablet Take 100 mg at bedtime by mouth.      No current facility-administered medications on file prior to visit.     Allergies:  No Known Allergies  Physical Exam  Vitals:   01/31/18 1310  BP: (!) 143/80  Pulse: 63  Weight: 187 lb 9.6 oz (85.1 kg)  Height: 5\' 10"  (1.778 m)   Body mass index is 26.92 kg/m. No exam data present  General: well developed, pleasant elderly Caucasian male,  well nourished, seated, in no evident distress Head: head normocephalic and atraumatic.   Neck: supple with no carotid or supraclavicular bruits Cardiovascular: regular rate and rhythm, no murmurs Musculoskeletal: no deformity Skin:  no rash/petichiae Vascular:  Normal pulses all extremities  Neurologic Exam Mental Status: Awake and fully alert. Oriented to place and time. Recent and remote memory intact. Attention span, concentration and fund of knowledge appropriate. Mood and affect appropriate.  Cranial Nerves: Fundoscopic exam reveals sharp disc margins. Pupils equal, briskly reactive to light. Extraocular movements full without nystagmus. Visual fields full to confrontation. Hearing intact. Facial sensation intact. Face, tongue, palate moves normally and symmetrically.  Motor: Normal bulk and tone. Normal strength in all tested extremity muscles. Sensory.: intact to touch , pinprick , position and vibratory sensation.  Coordination: Rapid alternating movements normal in all  extremities.  Finger-to-nose and heel-to-shin performed accurately bilaterally. Gait and Station: Arises from chair without difficulty. Stance is normal. Gait demonstrates normal stride length and balance . Able to heel, toe and tandem walk without difficulty.  Reflexes: 1+ and symmetric. Toes downgoing.    Diagnostic Data (Labs, Imaging, Testing)  Nocturnal Polysomnogram 12/20/17 IMPRESSIONS - Mild obstructive sleep apnea occurred during this study (AHI = 10.1/h). - No significant central sleep apnea occurred during this study (CAI = 0.0/h). - Mild oxygen desaturation was noted during this study (Min O2 = 86.0%). - The patient snored with soft snoring volume. - EKG findings include PVCs and PACs. - Clinically significant periodic limb movements did not occur during sleep. No significant associated arousals.  DIAGNOSIS - Obstructive Sleep Apnea (327.23 [G47.33 ICD-10]) - PACs - PVCs   ASSESSMENT: 77 y.o. year old male here with cryptogenic stroke.  Location consistent with small vessel disease but also possible cardioembolic source with low EF of 35-40% and frequent PACs and PVCs.  Vascular risk factors include hyperlipidemia, hypertension, and sleep apnea.  Overall patient is doing well from a stroke standpoint but does have concerns of worsening depression over the past several years.   PLAN:  -Continue aspirin 325 mg daily  and lipitor  for secondary stroke prevention -F/u with PCP regarding your HLD  and HTN management -Advised patient to schedule appointment with psychiatrist for depression management -Advised patient to follow-up with sleep study results for possible need of CPAP for newly diagnosed OSA -continue to monitor BP at home -monitor loop recorder -Maintain strict control of hypertension with blood pressure goal below 130/90, diabetes with hemoglobin A1c goal below 6.5% and cholesterol with LDL cholesterol (bad cholesterol) goal below 70 mg/dL. I also advised the  patient to eat a healthy diet with plenty of whole grains, cereals, fruits and vegetables, exercise regularly and maintain ideal body weight.  Follow up in 6 months or call earlier if needed  Greater than 50% of time during this 25 minute visit was spent on counseling,explanation of diagnosis of cryptogenic stroke, reviewing risk factor management of HLD, HTN, and sleep apnea, planning of further management, discussion with patient and family and coordination of care  Venancio Poisson, [facility]  [facility] [address] [place], [place] [address]  Phone [phone] Fax [phone]

## 2018-02-02 LAB — CUP PACEART REMOTE DEVICE CHECK
Date Time Interrogation Session: 20190418003616
Implantable Pulse Generator Implant Date: 20181112

## 2018-02-05 ENCOUNTER — Ambulatory Visit: Payer: PPO | Admitting: Adult Health

## 2018-02-05 ENCOUNTER — Ambulatory Visit (INDEPENDENT_AMBULATORY_CARE_PROVIDER_SITE_OTHER): Payer: PPO | Admitting: *Deleted

## 2018-02-05 DIAGNOSIS — I639 Cerebral infarction, unspecified: Secondary | ICD-10-CM | POA: Diagnosis not present

## 2018-02-06 NOTE — Progress Notes (Signed)
Carelink Summary Report / Loop Recorder 

## 2018-02-12 ENCOUNTER — Other Ambulatory Visit: Payer: Self-pay | Admitting: Internal Medicine

## 2018-02-12 ENCOUNTER — Encounter (HOSPITAL_BASED_OUTPATIENT_CLINIC_OR_DEPARTMENT_OTHER): Payer: Self-pay | Admitting: Cardiovascular Disease

## 2018-02-12 NOTE — Procedures (Signed)
Patient Name: Jared [name], Jared [name] Date: 01/19/2018 Gender: Male D.O.B: 03/18/1941 Age (years): 60 Referring Provider: Shelva Majestic MD, ABSM Height (inches): 70 Interpreting Physician: Shelva Majestic MD, ABSM Weight (lbs): 184 RPSGT: Lanae Boast BMI: 26 MRN: [id number] Neck Size: 17.00  CLINICAL INFORMATION The patient is referred for a CPAP titration to treat sleep apnea.  Date of NPSG: 12/20/2017:  AHI 10.1/h; RDI 12.7/h; AHI during REM sleep 26.2/h: supine sleep AHI 13.2/h: oxygen desaturation to 86%.  SLEEP STUDY TECHNIQUE As per the AASM Manual for the Scoring of Sleep and Associated Events v2.3 (April 2016) with a hypopnea requiring 4% desaturations.  The channels recorded and monitored were frontal, central and occipital EEG, electrooculogram (EOG), submentalis EMG (chin), nasal and oral airflow, thoracic and abdominal wall motion, anterior tibialis EMG, snore microphone, electrocardiogram, and pulse oximetry. Continuous positive airway pressure (CPAP) was initiated at the beginning of the study and titrated to treat sleep-disordered breathing.  MEDICATIONS     ALPRAZolam (XANAX) 0.5 MG tablet             amLODipine (NORVASC) 10 MG tablet         aspirin EC 325 MG EC tablet         atorvastatin (LIPITOR) 80 MG tablet         buPROPion (WELLBUTRIN SR) 150 MG 12 hr tablet         carvedilol (COREG) 6.25 MG tablet         finasteride (PROSCAR) 5 MG tablet         fluorometholone (FML) 0.1 % ophthalmic suspension         losartan (COZAAR) 50 MG tablet         omeprazole (PRILOSEC) 10 MG capsule         RESTASIS 0.05 % ophthalmic emulsion         timolol (TIMOPTIC) 0.5 % ophthalmic solution         traZODone (DESYREL) 100 MG tablet      Medications self-administered by patient taken the night of the study : XANAX, TRAZODONE  TECHNICIAN COMMENTS Comments added by technician: Patient had difficulty initiating sleep.  RESPIRATORY PARAMETERS Optimal PAP  Pressure (cm): 8 AHI at Optimal Pressure (/hr): 0.8 Overall Minimal O2 (%): 86.0 Supine % at Optimal Pressure (%): 100 Minimal O2 at Optimal Pressure (%): 90.0   SLEEP ARCHITECTURE The study was initiated at 10:49:31 PM and ended at 5:11:51 AM.  Sleep onset time was 98.3 minutes and the sleep efficiency was 44.1%%. The total sleep time was 168.5 minutes.  The patient spent 10.1%% of the night in stage N1 sleep, 81.0%% in stage N2 sleep, 0.0%% in stage N3 and 8.90% in REM.Stage REM latency was 190.5 minutes  Wake after sleep onset was 115.5. Alpha intrusion was absent. Supine sleep was 100.00%.  CARDIAC DATA The 2 lead EKG demonstrated sinus rhythm. The mean heart rate was 48.7 beats per minute. Other EKG findings include: PVCs.  LEG MOVEMENT DATA The total Periodic Limb Movements of Sleep (PLMS) were 0. The PLMS index was 0.0. A PLMS index of <15 is considered normal in adults.  IMPRESSIONS - The patient was titrated to an optimal PAP pressure of 8 cm of water. - Central sleep apnea was not noted during this titration (CAI = 0.0/h). - Moderate oxygen desaturations to a nadir of 86%. - The patient snored with soft snoring volume during this titration study. - 2-lead EKG demonstrated: PVCs - Clinically significant periodic limb movements  were not noted during this study. Arousals associated with PLMs were rare.  DIAGNOSIS - Obstructive Sleep Apnea (327.23 [G47.33 ICD-10])  RECOMMENDATIONS - Recommend an initial trial of CPAP therapy at 8 cm H2O with a Medium size Resmed Full Face Mask AirFit F10 mask and heated humidification. - Efforts should be made to optimize nasal and oropharyngeal patency.  - Avoid alcohol, sedatives and other CNS depressants that may worsen sleep apnea and disrupt normal sleep architecture. - Sleep hygiene should be reviewed to assess factors that may improve sleep quality. - Weight management and regular exercise should be initiated or continued. - Recommend  a download be obtained in 30 days and sleep clinic evaluation after 4 weeks of therapy  [Electronically signed] 02/12/2018 06:48 PM  Shelva Majestic MD, [facility], ABSM Diplomate, American Board of Sleep Medicine   NPI: [id number] [place] PH: [phone]   FX: [phone] [place]

## 2018-02-14 ENCOUNTER — Telehealth: Payer: Self-pay | Admitting: Cardiology

## 2018-02-14 NOTE — Telephone Encounter (Signed)
Spoke w/ pt and requested that he send a manual transmission b/c his home monitor has not updated in at least 14 days.   

## 2018-02-15 ENCOUNTER — Ambulatory Visit: Payer: PPO | Admitting: Psychology

## 2018-02-23 ENCOUNTER — Other Ambulatory Visit: Payer: Self-pay | Admitting: Internal Medicine

## 2018-02-26 LAB — CUP PACEART REMOTE DEVICE CHECK
Date Time Interrogation Session: 20190521013748
Implantable Pulse Generator Implant Date: 20181112

## 2018-02-28 ENCOUNTER — Emergency Department (HOSPITAL_COMMUNITY)
Admission: EM | Admit: 2018-02-28 | Discharge: 2018-02-28 | Disposition: A | Discharge status: Home / Self Care | Payer: PPO | Attending: Emergency Medicine | Admitting: Emergency Medicine

## 2018-02-28 ENCOUNTER — Encounter (HOSPITAL_COMMUNITY): Payer: Self-pay | Admitting: Emergency Medicine

## 2018-02-28 ENCOUNTER — Other Ambulatory Visit: Payer: Self-pay

## 2018-02-28 DIAGNOSIS — Z79899 Other long term (current) drug therapy: Secondary | ICD-10-CM | POA: Insufficient documentation

## 2018-02-28 DIAGNOSIS — F13230 Sedative, hypnotic or anxiolytic dependence with withdrawal, uncomplicated: Secondary | ICD-10-CM

## 2018-02-28 DIAGNOSIS — I10 Essential (primary) hypertension: Secondary | ICD-10-CM | POA: Diagnosis not present

## 2018-02-28 DIAGNOSIS — F13930 Sedative, hypnotic or anxiolytic use, unspecified with withdrawal, uncomplicated: Secondary | ICD-10-CM

## 2018-02-28 DIAGNOSIS — F329 Major depressive disorder, single episode, unspecified: Secondary | ICD-10-CM | POA: Diagnosis present

## 2018-02-28 DIAGNOSIS — Z7982 Long term (current) use of aspirin: Secondary | ICD-10-CM | POA: Diagnosis not present

## 2018-02-28 LAB — CBC
HCT: 42.3 % (ref 39.0–52.0)
HEMOGLOBIN: 14.5 g/dL (ref 13.0–17.0)
MCH: 30.5 pg (ref 26.0–34.0)
MCHC: 34.3 g/dL (ref 30.0–36.0)
MCV: 88.9 fL (ref 78.0–100.0)
PLATELETS: 157 10*3/uL (ref 150–400)
RBC: 4.76 MIL/uL (ref 4.22–5.81)
RDW: 11.9 % (ref 11.5–15.5)
WBC: 5.2 10*3/uL (ref 4.0–10.5)

## 2018-02-28 LAB — BASIC METABOLIC PANEL
ANION GAP: 7 (ref 5–15)
BUN: 14 mg/dL (ref 6–20)
CALCIUM: 9.4 mg/dL (ref 8.9–10.3)
CO2: 26 mmol/L (ref 22–32)
CREATININE: 1.01 mg/dL (ref 0.61–1.24)
Chloride: 111 mmol/L (ref 101–111)
Glucose, Bld: 167 mg/dL — ABNORMAL HIGH (ref 65–99)
Potassium: 3.3 mmol/L — ABNORMAL LOW (ref 3.5–5.1)
SODIUM: 144 mmol/L (ref 135–145)

## 2018-02-28 LAB — URINALYSIS, ROUTINE W REFLEX MICROSCOPIC
BILIRUBIN URINE: NEGATIVE
Glucose, UA: NEGATIVE mg/dL
HGB URINE DIPSTICK: NEGATIVE
Ketones, ur: NEGATIVE mg/dL
Leukocytes, UA: NEGATIVE
NITRITE: NEGATIVE
PROTEIN: NEGATIVE mg/dL
SPECIFIC GRAVITY, URINE: 1.01 (ref 1.005–1.030)
pH: 7 (ref 5.0–8.0)

## 2018-02-28 MED ORDER — ALPRAZOLAM 0.25 MG PO TABS
0.5000 mg | ORAL_TABLET | Freq: Once | ORAL | Status: AC
Start: 2018-02-28 — End: 2018-02-28
  Administered 2018-02-28: 0.5 mg via ORAL
  Filled 2018-02-28: qty 2

## 2018-02-28 NOTE — ED Triage Notes (Addendum)
Arrived by EMS from home. Onset 2-3 days ago developed shaking in arms when trying to write name or other activities. Concerned on going. Patient alert answering and following commands appropriate.  EMS reported patient's EKG afib. Denies chest pain or shortness of breath. Upon arrival patient stated ran out of multiple medications and this is when symptoms started.

## 2018-02-28 NOTE — ED Provider Notes (Deleted)
0.  [facility] EMERGENCY DEPARTMENT Provider Note   CSN: [id number] Arrival date & time: 02/28/18  1021     History   Chief Complaint Chief Complaint  Patient presents with  . Fatigue  . Tremors    HPI Jared [name] is a 77 y.o. male.  HPI  Past Medical History:  Diagnosis Date  . ALLERGIC RHINITIS 10/05/2007  . ANXIETY 10/05/2007  . BENIGN PROSTATIC HYPERTROPHY 04/08/2009  . COLONIC POLYPS, HX OF 10/05/2007  . DEPRESSION 10/05/2007  . GERD 10/05/2007  . Heart murmur   . HYPERTENSION 10/05/2007  . NEPHROLITHIASIS, HX OF 10/05/2007  . SLEEP APNEA, OBSTRUCTIVE 10/02/2008  . Stroke [facility])     Patient Active Problem List   Diagnosis Date Noted  . Ejection fraction < 50% 11/07/2017  . Ischemic cardiomyopathy   . Hyperlipidemia   . Dizziness 07/27/2017  . Fall at home, initial encounter 07/27/2017  . Frequent PVCs 07/27/2017  . Cryptogenic stroke ([place]) 07/27/2017  . Diverticulosis of colon without hemorrhage 02/06/2013  . Right inguinal hernia 10/12/2012  . BPH (benign prostatic hyperplasia) 04/08/2009  . OSA (obstructive sleep apnea) 10/02/2008  . Anxiety state 10/05/2007  . Depression 10/05/2007  . Essential hypertension 10/05/2007  . Allergic rhinitis 10/05/2007  . GERD 10/05/2007  . History of colonic polyps 10/05/2007  . NEPHROLITHIASIS, HX OF 10/05/2007    Past Surgical History:  Procedure Laterality Date  . APPENDECTOMY    . CATARACT EXTRACTION  2014   bilateral  . CHOLECYSTECTOMY    . FOOT SURGERY     left  . HERNIA REPAIR  2014  . LOOP RECORDER INSERTION N/A 07/31/2017   Procedure: LOOP RECORDER INSERTION;  Surgeon: Evans Lance, MD;  Location: [facility];  Service: Cardiovascular;  Laterality: N/A;  . TEE WITHOUT CARDIOVERSION N/A 07/31/2017   Procedure: TRANSESOPHAGEAL ECHOCARDIOGRAM (TEE) WITH LOOP;  Surgeon: Dorothy Spark, MD;  Location: [place] [facility];  Service: Cardiovascular;  Laterality: N/A;  . WRIST FRACTURE  SURGERY     right x2   left x1        Home Medications    Prior to Admission medications   Medication Sig Start Date End Date Taking? Authorizing Provider  ALPRAZolam Duanne Moron) 0.5 MG tablet Take 0.5 mg 3 (three) times daily by mouth.  10/17/11   [provider]  amLODipine (NORVASC) 10 MG tablet TAKE 1 TABLET BY MOUTH EVERY DAY 02/13/18   Marletta Lor, MD  aspirin EC 325 MG EC tablet Take 1 tablet (325 mg total) daily by mouth. 08/01/17   Lavina Hamman, MD  atorvastatin (LIPITOR) 80 MG tablet Take 80 mg by mouth daily at 6 PM.    [provider]  buPROPion (WELLBUTRIN SR) 150 MG 12 hr tablet Take 150 mg by mouth 2 (two) times daily.    [provider]  carvedilol (COREG) 6.25 MG tablet Take 1 tablet (6.25 mg total) 2 (two) times daily with a meal by mouth. 07/31/17   Lavina Hamman, MD  finasteride (PROSCAR) 5 MG tablet TAKE 1 TABLET (5 MG TOTAL) BY MOUTH DAILY. 01/17/18   Marletta Lor, MD  fluorometholone (FML) 0.1 % ophthalmic suspension Place 1 drop 3 (three) times daily into both eyes. 04/26/17   [provider]  losartan (COZAAR) 50 MG tablet TAKE 1 TABLET BY MOUTH EVERY DAY 01/31/18   Marletta Lor, MD  omeprazole (PRILOSEC) 10 MG capsule TAKE ONE CAPSULE BY MOUTH EVERY DAY 02/23/18  Marletta Lor, MD  RESTASIS 0.05 % ophthalmic emulsion Place 1 drop 2 (two) times daily into both eyes. 08/03/17   [provider]  timolol (TIMOPTIC) 0.5 % ophthalmic solution Place 1 drop daily into both eyes. 06/29/17   [provider]  traZODone (DESYREL) 100 MG tablet Take 100 mg at bedtime by mouth.  11/24/14   [provider]    Family History Family History  Problem Relation Age of Onset  . Colon cancer Maternal Aunt 70    Social History Social History   Tobacco Use  . Smoking status: Never Smoker  . Smokeless tobacco: Never Used  Substance Use Topics  . Alcohol use: No  . Drug use: No      Allergies   Patient has no known allergies.   Review of Systems Review of Systems   Physical Exam Updated Vital Signs BP (!) 149/62   Pulse 74   Temp 98.2 F (36.8 C) (Oral)   Resp 16   Ht 5\' 10"  (1.778 m)   Wt 77.1 kg (170 lb)   SpO2 98%   BMI 24.39 kg/m   Physical Exam  Constitutional: He is oriented to person, place, and time. He appears well-developed and well-nourished. No distress.  HENT:  Head: Normocephalic and atraumatic.  Eyes: Conjunctivae are normal. No scleral icterus.  Neck: Normal range of motion. Neck supple.  Cardiovascular: Regular rhythm and normal heart sounds.  tachycardic  Pulmonary/Chest: Effort normal and breath sounds normal. No respiratory distress.  Abdominal: Soft. There is no tenderness.  Musculoskeletal: He exhibits no edema.  Neurological: He is alert and oriented to person, place, and time.  Tremulous  Skin: Skin is warm and dry. He is not diaphoretic.  Psychiatric: His behavior is normal.  Anxious  Nursing note and vitals reviewed.    ED Treatments / Results  Labs (all labs ordered are listed, but only abnormal results are displayed) Labs Reviewed  BASIC METABOLIC PANEL  CBC  URINALYSIS, ROUTINE W REFLEX MICROSCOPIC    EKG None  Radiology No results found.  Procedures Procedures (including critical care time)  Medications Ordered in ED Medications - No data to display   Initial Impression / Assessment and Plan / ED Course  I have reviewed the triage vital signs and the nursing notes.  Pertinent labs & imaging results that were available during my care of the patient were reviewed by me and considered in my medical decision making (see chart for details).  Clinical Course as of Mar 01 1631  Wed Feb 28, 2018  1244 Patient symptoms have resolved with single dose of 0.5 mg Xanax.  Patient states he was several days early and has had that happen in the past but usually has week 1 day before his meds are  refilled.  During this visit the drugstore called and said that his prescription is ready.  His symptoms have resolved he feels much improved and will go pick up his normal prescription.   [AH]  1258 Platelets: 157 [AH]    Clinical Course User Index [AH] Margarita Mail, PA-C     Final Clinical Impressions(s) / ED Diagnoses   Final diagnoses:  Benzodiazepine withdrawal without complication [facility])    ED Discharge Orders    None       Margarita Mail, PA-C 02/28/18 1634    Sherwood Gambler, MD 03/02/18 [place], [place], PA-C 03/28/18 1515

## 2018-02-28 NOTE — Discharge Instructions (Addendum)
Contact a health care provider if: You are not able to take your medicines as told by your health care provider. You have symptoms that get worse. You develop withdrawal symptoms during your tapered withdrawal. You develop a craving for drugs or alcohol. You experience withdrawal again (relapse). Get help right away if: You have a seizure. You become very confused. You lose consciousness. You have difficulty breathing. You have serious thoughts about hurting yourself or someone else.

## 2018-03-09 ENCOUNTER — Encounter: Payer: Self-pay | Admitting: Internal Medicine

## 2018-03-12 ENCOUNTER — Ambulatory Visit (INDEPENDENT_AMBULATORY_CARE_PROVIDER_SITE_OTHER): Payer: PPO | Admitting: *Deleted

## 2018-03-12 DIAGNOSIS — I639 Cerebral infarction, unspecified: Secondary | ICD-10-CM

## 2018-03-12 NOTE — Progress Notes (Signed)
Carelink Summary Report / Loop Recorder 

## 2018-04-11 ENCOUNTER — Ambulatory Visit: Payer: PPO | Admitting: Internal Medicine

## 2018-04-12 ENCOUNTER — Ambulatory Visit (INDEPENDENT_AMBULATORY_CARE_PROVIDER_SITE_OTHER): Payer: PPO | Admitting: *Deleted

## 2018-04-12 DIAGNOSIS — I639 Cerebral infarction, unspecified: Secondary | ICD-10-CM | POA: Diagnosis not present

## 2018-04-13 NOTE — Progress Notes (Signed)
Carelink Summary Report / Loop Recorder 

## 2018-04-24 LAB — CUP PACEART REMOTE DEVICE CHECK
Implantable Pulse Generator Implant Date: 20181112
MDC IDC SESS DTM: 20190623020533

## 2018-04-28 ENCOUNTER — Other Ambulatory Visit: Payer: Self-pay | Admitting: Internal Medicine

## 2018-05-07 ENCOUNTER — Ambulatory Visit (INDEPENDENT_AMBULATORY_CARE_PROVIDER_SITE_OTHER): Payer: PPO | Admitting: Internal Medicine

## 2018-05-07 ENCOUNTER — Encounter: Payer: Self-pay | Admitting: Internal Medicine

## 2018-05-07 VITALS — BP 140/62 | HR 61 | Temp 98.2°F | Wt 183.3 lb

## 2018-05-07 DIAGNOSIS — I639 Cerebral infarction, unspecified: Secondary | ICD-10-CM

## 2018-05-07 DIAGNOSIS — I10 Essential (primary) hypertension: Secondary | ICD-10-CM

## 2018-05-07 DIAGNOSIS — I255 Ischemic cardiomyopathy: Secondary | ICD-10-CM | POA: Diagnosis not present

## 2018-05-07 DIAGNOSIS — E7800 Pure hypercholesterolemia, unspecified: Secondary | ICD-10-CM | POA: Diagnosis not present

## 2018-05-07 MED ORDER — ALPRAZOLAM 0.5 MG PO TABS: 0.5000 mg | ORAL_TABLET | Freq: Three times a day (TID) | ORAL | 2 refills | Status: DC

## 2018-05-07 MED ORDER — CARVEDILOL 6.25 MG PO TABS: 6.2500 mg | ORAL_TABLET | Freq: Two times a day (BID) | ORAL | 2 refills | Status: DC

## 2018-05-07 NOTE — Progress Notes (Signed)
Subjective:    Patient ID: Jared [name], male    DOB: May 10, 1941, 77 y.o.   MRN: [id number]  HPI  77 year old patient who is seen today in follow-up He has a history of essential hypertension and has been followed by neurology with a history of cryptogenic stroke.  He still has an ILR in place.  He was seen in the ED 2 months ago and was felt to have a benzodiazepine withdrawal.  Patient states that he has not been off his alprazolam. EKGs were reviewed and one tracing suggested atrial fibrillation but subsequent EKGs revealed normal sinus rhythm with frequent PACs.  Initial tracing suggest an atrial fib was probably artifact. Today doing quite well.  He does complain of chronic anxiety and states that at times he feels he needs more than 3 alprazolam per day he has been seen by behavioral health in the past  He apparently has not been taking carvedilol.  This medication was refilled today  Past Medical History:  Diagnosis Date  . ALLERGIC RHINITIS 10/05/2007  . ANXIETY 10/05/2007  . BENIGN PROSTATIC HYPERTROPHY 04/08/2009  . COLONIC POLYPS, HX OF 10/05/2007  . DEPRESSION 10/05/2007  . GERD 10/05/2007  . Heart murmur   . HYPERTENSION 10/05/2007  . NEPHROLITHIASIS, HX OF 10/05/2007  . SLEEP APNEA, OBSTRUCTIVE 10/02/2008  . Stroke [facility])      Social History   Socioeconomic History  . Marital status: Widowed    Spouse name: Not on file  . Number of children: Not on file  . Years of education: Not on file  . Highest education level: Not on file  Occupational History  . Not on file  Social Needs  . Financial resource strain: Not on file  . Food insecurity:    Worry: Not on file    Inability: Not on file  . Transportation needs:    Medical: Not on file    Non-medical: Not on file  Tobacco Use  . Smoking status: Never Smoker  . Smokeless tobacco: Never Used  Substance and Sexual Activity  . Alcohol use: No  . Drug use: No  . Sexual activity: Not on file  Lifestyle  . Physical  activity:    Days per week: Not on file    Minutes per session: Not on file  . Stress: Not on file  Relationships  . Social connections:    Talks on phone: Not on file    Gets together: Not on file    Attends religious service: Not on file    Active member of club or organization: Not on file    Attends meetings of clubs or organizations: Not on file    Relationship status: Not on file  . Intimate partner violence:    Fear of current or ex partner: Not on file    Emotionally abused: Not on file    Physically abused: Not on file    Forced sexual activity: Not on file  Other Topics Concern  . Not on file  Social History Narrative  . Not on file    Past Surgical History:  Procedure Laterality Date  . APPENDECTOMY    . CATARACT EXTRACTION  2014   bilateral  . CHOLECYSTECTOMY    . FOOT SURGERY     left  . HERNIA REPAIR  2014  . LOOP RECORDER INSERTION N/A 07/31/2017   Procedure: LOOP RECORDER INSERTION;  Surgeon: Evans Lance, MD;  Location: [place] CV LAB;  Service: Cardiovascular;  Laterality: N/A;  .  TEE WITHOUT CARDIOVERSION N/A 07/31/2017   Procedure: TRANSESOPHAGEAL ECHOCARDIOGRAM (TEE) WITH LOOP;  Surgeon: Dorothy Spark, MD;  Location: [facility] ENDOSCOPY;  Service: Cardiovascular;  Laterality: N/A;  . WRIST FRACTURE SURGERY     right x2   left x1    Family History  Problem Relation Age of Onset  . Colon cancer Maternal Aunt 70    No Known Allergies  Current Outpatient Medications on File Prior to Visit  Medication Sig Dispense Refill  . amLODipine (NORVASC) 10 MG tablet TAKE 1 TABLET BY MOUTH EVERY DAY 90 tablet 1  . aspirin EC 325 MG EC tablet Take 1 tablet (325 mg total) daily by mouth. 30 tablet 0  . atorvastatin (LIPITOR) 80 MG tablet Take 80 mg by mouth daily at 6 PM.    . buPROPion (WELLBUTRIN SR) 150 MG 12 hr tablet Take 150 mg by mouth 2 (two) times daily.    . finasteride (PROSCAR) 5 MG tablet TAKE 1 TABLET (5 MG TOTAL) BY MOUTH DAILY. 90 tablet 1   . fluorometholone (FML) 0.1 % ophthalmic suspension Place 1 drop 3 (three) times daily into both eyes.  0  . losartan (COZAAR) 50 MG tablet TAKE 1 TABLET BY MOUTH EVERY DAY 90 tablet 0  . omeprazole (PRILOSEC) 10 MG capsule TAKE ONE CAPSULE BY MOUTH EVERY DAY 90 capsule 3  . RESTASIS 0.05 % ophthalmic emulsion Place 1 drop 2 (two) times daily into both eyes.  4  . timolol (TIMOPTIC) 0.5 % ophthalmic solution Place 1 drop daily into both eyes.  0  . traZODone (DESYREL) 100 MG tablet Take 100 mg at bedtime by mouth.      No current facility-administered medications on file prior to visit.     BP 140/62 (BP Location: Right Arm, Patient Position: Sitting, Cuff Size: Large)   Pulse 61   Temp 98.2 F (36.8 C) (Oral)   Wt 183 lb 4.8 oz (83.1 kg)   SpO2 97%   BMI 26.30 kg/m     Review of Systems  Constitutional: Negative for appetite change, chills, fatigue and fever.  HENT: Negative for congestion, dental problem, ear pain, hearing loss, sore throat, tinnitus, trouble swallowing and voice change.   Eyes: Negative for pain, discharge and visual disturbance.  Respiratory: Negative for cough, chest tightness, wheezing and stridor.   Cardiovascular: Negative for chest pain, palpitations and leg swelling.  Gastrointestinal: Negative for abdominal distention, abdominal pain, blood in stool, constipation, diarrhea, nausea and vomiting.  Genitourinary: Negative for difficulty urinating, discharge, flank pain, genital sores, hematuria and urgency.  Musculoskeletal: Negative for arthralgias, back pain, gait problem, joint swelling, myalgias and neck stiffness.  Skin: Negative for rash.  Neurological: Negative for dizziness, syncope, speech difficulty, weakness, numbness and headaches.  Hematological: Negative for adenopathy. Does not bruise/bleed easily.  Psychiatric/Behavioral: Positive for behavioral problems. Negative for dysphoric mood. The patient is nervous/anxious.        Objective:    Physical Exam  Constitutional: He is oriented to person, place, and time. He appears well-developed.  HENT:  Head: Normocephalic.  Right Ear: External ear normal.  Left Ear: External ear normal.  Eyes: Conjunctivae and EOM are normal.  Neck: Normal range of motion.  Cardiovascular: Normal rate and normal heart sounds.  Rhythm is slow and irregular with frequent ectopics  Pulmonary/Chest: Breath sounds normal.  Abdominal: Bowel sounds are normal.  Musculoskeletal: Normal range of motion. He exhibits no edema or tenderness.  Trace pedal edema  Neurological: He is alert and  oriented to person, place, and time.  Psychiatric: He has a normal mood and affect. His behavior is normal.          Assessment & Plan:   Essential hypertension stable History of cryptogenic stroke History of ischemic cardiomyopathy.  Will resume carvedilol 6.25 mg twice daily Anxiety disorder.  Alprazolam refilled  Follow-up cardiology and neurology  Return in 3 months for follow-up  Marletta Lor

## 2018-05-07 NOTE — Patient Instructions (Signed)
Limit your sodium (Salt) intake  Return in 3 months for follow-up  Neurology follow-up as scheduled  Resume carvedilol 6.25 mg twice daily

## 2018-05-08 ENCOUNTER — Telehealth: Payer: Self-pay | Admitting: Cardiology

## 2018-05-08 NOTE — Telephone Encounter (Signed)
Spoke w/ pt and requested that he send a manual transmission b/c his home monitor has not updated in at least 14 days.   

## 2018-05-15 ENCOUNTER — Ambulatory Visit (INDEPENDENT_AMBULATORY_CARE_PROVIDER_SITE_OTHER): Payer: PPO | Admitting: *Deleted

## 2018-05-15 DIAGNOSIS — I639 Cerebral infarction, unspecified: Secondary | ICD-10-CM | POA: Diagnosis not present

## 2018-05-16 NOTE — Progress Notes (Signed)
Carelink Summary Report / Loop Recorder 

## 2018-05-22 ENCOUNTER — Telehealth: Payer: Self-pay | Admitting: Cardiology

## 2018-05-22 NOTE — Telephone Encounter (Signed)
[facility] requesting that pt send manual transmission b/c home monitor has not updated in at least 14 days.    

## 2018-05-28 LAB — CUP PACEART REMOTE DEVICE CHECK
Implantable Pulse Generator Implant Date: 20181112
MDC IDC SESS DTM: 20190726024026

## 2018-05-31 ENCOUNTER — Other Ambulatory Visit: Payer: Self-pay | Admitting: Internal Medicine

## 2018-06-01 ENCOUNTER — Telehealth: Payer: Self-pay | Admitting: *Deleted

## 2018-06-01 NOTE — Telephone Encounter (Signed)
Dr. Lovena Le reviewed "AF" episode noted on LINQ from 05/17/18, duration 50min. ECG shows probable A-fib per Dr. Lovena Le, recommended to schedule f/u in 3-4 weeks to discuss episode. Patient is agreeable to appointment on 06/19/18 at 2:00pm. He denies any questions or new concerns at this time.

## 2018-06-04 ENCOUNTER — Other Ambulatory Visit: Payer: Self-pay | Admitting: Internal Medicine

## 2018-06-12 LAB — CUP PACEART REMOTE DEVICE CHECK
Implantable Pulse Generator Implant Date: 20181112
MDC IDC SESS DTM: 20190828023609

## 2018-06-18 ENCOUNTER — Ambulatory Visit (INDEPENDENT_AMBULATORY_CARE_PROVIDER_SITE_OTHER): Payer: PPO | Admitting: *Deleted

## 2018-06-18 DIAGNOSIS — I639 Cerebral infarction, unspecified: Secondary | ICD-10-CM

## 2018-06-18 NOTE — Progress Notes (Signed)
Carelink Summary Report / Loop Recorder 

## 2018-06-19 ENCOUNTER — Encounter: Payer: Self-pay | Admitting: Internal Medicine

## 2018-06-19 ENCOUNTER — Ambulatory Visit (INDEPENDENT_AMBULATORY_CARE_PROVIDER_SITE_OTHER): Payer: PPO | Admitting: Internal Medicine

## 2018-06-19 VITALS — BP 132/62 | HR 66 | Ht 70.0 in | Wt 186.0 lb

## 2018-06-19 DIAGNOSIS — I639 Cerebral infarction, unspecified: Secondary | ICD-10-CM

## 2018-06-19 DIAGNOSIS — I480 Paroxysmal atrial fibrillation: Secondary | ICD-10-CM | POA: Diagnosis not present

## 2018-06-19 LAB — CUP PACEART INCLINIC DEVICE CHECK
Date Time Interrogation Session: 20191001145237
Implantable Pulse Generator Implant Date: 20181112

## 2018-06-19 MED ORDER — RIVAROXABAN 20 MG PO TABS: 20.0000 mg | ORAL_TABLET | Freq: Every day | ORAL | 3 refills | Status: DC

## 2018-06-19 NOTE — Patient Instructions (Addendum)
Medication Instructions:  Your physician has recommended you make the following change in your medication:   1.  Stop taking ASPIRIN   2.  Start taking Xarelto 20 mg- take one tablet by mouth daily with food  Labwork: None ordered.  Testing/Procedures: None ordered.  Follow-Up: Your physician wants you to follow-up in: one year with Dr. Lovena Le.   You will receive a reminder letter in the mail two months in advance. If you don't receive a letter, please call our office to schedule the follow-up appointment.  Any Other Special Instructions Will Be Listed Below (If Applicable).  If you need a refill on your cardiac medications before your next appointment, please call your pharmacy.

## 2018-06-19 NOTE — Progress Notes (Signed)
HPI Jared [name] returns today for ongoing evaluation and management of syncope, s/p ILR insertion. In the interim, he has not had any dizzy spells and not passed out. He is not convinced he has ever passed out. He does admit to falling. He does not feel palpitations. He notes that he snores loudly (is told by his family) and has daytime fatigue and sleepiness. He has been found to have atrial fib. He notes that he had a sleep study and does not know the result of the study. He often feels tired when he wakes up in the morning. No Known Allergies   Current Outpatient Medications  Medication Sig Dispense Refill  . ALPRAZolam (XANAX) 0.5 MG tablet Take 1 tablet (0.5 mg total) by mouth 3 (three) times daily. 90 tablet 2  . amLODipine (NORVASC) 10 MG tablet TAKE 1 TABLET BY MOUTH EVERY DAY 90 tablet 1  . aspirin EC 325 MG EC tablet Take 1 tablet (325 mg total) daily by mouth. 30 tablet 0  . atorvastatin (LIPITOR) 80 MG tablet Take 80 mg by mouth daily at 6 PM.    . buPROPion (WELLBUTRIN SR) 150 MG 12 hr tablet Take 150 mg by mouth 2 (two) times daily.    . carvedilol (COREG) 6.25 MG tablet Take 1 tablet (6.25 mg total) by mouth 2 (two) times daily with a meal. 180 tablet 2  . finasteride (PROSCAR) 5 MG tablet TAKE 1 TABLET (5 MG TOTAL) BY MOUTH DAILY. 90 tablet 1  . fluorometholone (FML) 0.1 % ophthalmic suspension Place 1 drop 3 (three) times daily into both eyes.  0  . losartan (COZAAR) 50 MG tablet TAKE 1 TABLET BY MOUTH EVERY DAY 90 tablet 0  . omeprazole (PRILOSEC) 10 MG capsule TAKE ONE CAPSULE BY MOUTH EVERY DAY 90 capsule 3  . RESTASIS 0.05 % ophthalmic emulsion Place 1 drop 2 (two) times daily into both eyes.  4  . timolol (TIMOPTIC) 0.5 % ophthalmic solution Place 1 drop daily into both eyes.  0  . traZODone (DESYREL) 100 MG tablet Take 100 mg at bedtime by mouth.      No current facility-administered medications for this visit.      Past Medical History:  Diagnosis Date  .  ALLERGIC RHINITIS 10/05/2007  . ANXIETY 10/05/2007  . BENIGN PROSTATIC HYPERTROPHY 04/08/2009  . COLONIC POLYPS, HX OF 10/05/2007  . DEPRESSION 10/05/2007  . GERD 10/05/2007  . Heart murmur   . HYPERTENSION 10/05/2007  . NEPHROLITHIASIS, HX OF 10/05/2007  . SLEEP APNEA, OBSTRUCTIVE 10/02/2008  . Stroke ([place])     ROS:   All systems reviewed and negative except as noted in the HPI.   Past Surgical History:  Procedure Laterality Date  . APPENDECTOMY    . CATARACT EXTRACTION  2014   bilateral  . CHOLECYSTECTOMY    . FOOT SURGERY     left  . HERNIA REPAIR  2014  . LOOP RECORDER INSERTION N/A 07/31/2017   Procedure: LOOP RECORDER INSERTION;  Surgeon: Evans Lance, MD;  Location: [place] CV LAB;  Service: Cardiovascular;  Laterality: N/A;  . TEE WITHOUT CARDIOVERSION N/A 07/31/2017   Procedure: TRANSESOPHAGEAL ECHOCARDIOGRAM (TEE) WITH LOOP;  Surgeon: Dorothy Spark, MD;  Location: [facility] ENDOSCOPY;  Service: Cardiovascular;  Laterality: N/A;  . WRIST FRACTURE SURGERY     right x2   left x1     Family History  Problem Relation Age of Onset  . Colon cancer Maternal Aunt 67  Social History   Socioeconomic History  . Marital status: Widowed    Spouse name: Not on file  . Number of children: Not on file  . Years of education: Not on file  . Highest education level: Not on file  Occupational History  . Not on file  Social Needs  . Financial resource strain: Not on file  . Food insecurity:    Worry: Not on file    Inability: Not on file  . Transportation needs:    Medical: Not on file    Non-medical: Not on file  Tobacco Use  . Smoking status: Never Smoker  . Smokeless tobacco: Never Used  Substance and Sexual Activity  . Alcohol use: No  . Drug use: No  . Sexual activity: Not on file  Lifestyle  . Physical activity:    Days per week: Not on file    Minutes per session: Not on file  . Stress: Not on file  Relationships  . Social connections:    Talks on  phone: Not on file    Gets together: Not on file    Attends religious service: Not on file    Active member of club or organization: Not on file    Attends meetings of clubs or organizations: Not on file    Relationship status: Not on file  . Intimate partner violence:    Fear of current or ex partner: Not on file    Emotionally abused: Not on file    Physically abused: Not on file    Forced sexual activity: Not on file  Other Topics Concern  . Not on file  Social History Narrative  . Not on file     BP 132/62   Pulse 66   Ht 5\' 10"  (1.778 m)   Wt 186 lb (84.4 kg)   BMI 26.69 kg/m   Physical Exam:  Well appearing NAD HEENT: Unremarkable Neck:  No JVD, no thyromegally Lymphatics:  No adenopathy Back:  No CVA tenderness Lungs:  Clear with no wheezes HEART:  IRegular rate rhythm, no murmurs, no rubs, no clicks Abd:  soft, positive bowel sounds, no organomegally, no rebound, no guarding Ext:  2 plus pulses, no edema, no cyanosis, no clubbing Skin:  No rashes no nodules Neuro:  CN II through XII intact, motor grossly intact  EKG - none  DEVICE  Normal device function.  See PaceArt for details.   Assess/Plan: 1. Cryptogenic stroke - interogation of his device demonstrates that he atrial fib. He will start xarelto. 2. PAF - he is asymptomatic. He will start anti-coag. 3. Probable sleep apnea - his study was read on 5/3. It recommended he start CPAP. We will refer him to get fitted. 4. HTN - his blood pressure controlled. He will continue his current meds.  Jared [name].D.

## 2018-06-20 ENCOUNTER — Encounter: Payer: Self-pay | Admitting: Internal Medicine

## 2018-06-20 ENCOUNTER — Telehealth: Payer: Self-pay | Admitting: *Deleted

## 2018-06-20 ENCOUNTER — Ambulatory Visit (INDEPENDENT_AMBULATORY_CARE_PROVIDER_SITE_OTHER): Payer: PPO | Admitting: Internal Medicine

## 2018-06-20 VITALS — BP 122/84 | HR 43 | Temp 97.8°F | Ht 70.0 in | Wt 186.0 lb

## 2018-06-20 DIAGNOSIS — F3341 Major depressive disorder, recurrent, in partial remission: Secondary | ICD-10-CM | POA: Diagnosis not present

## 2018-06-20 DIAGNOSIS — G4733 Obstructive sleep apnea (adult) (pediatric): Secondary | ICD-10-CM

## 2018-06-20 DIAGNOSIS — I10 Essential (primary) hypertension: Secondary | ICD-10-CM | POA: Diagnosis not present

## 2018-06-20 DIAGNOSIS — M1711 Unilateral primary osteoarthritis, right knee: Secondary | ICD-10-CM | POA: Insufficient documentation

## 2018-06-20 DIAGNOSIS — I480 Paroxysmal atrial fibrillation: Secondary | ICD-10-CM

## 2018-06-20 DIAGNOSIS — F1320 Sedative, hypnotic or anxiolytic dependence, uncomplicated: Secondary | ICD-10-CM

## 2018-06-20 DIAGNOSIS — I4891 Unspecified atrial fibrillation: Secondary | ICD-10-CM | POA: Insufficient documentation

## 2018-06-20 DIAGNOSIS — N401 Enlarged prostate with lower urinary tract symptoms: Secondary | ICD-10-CM | POA: Diagnosis not present

## 2018-06-20 LAB — CUP PACEART REMOTE DEVICE CHECK
Date Time Interrogation Session: 20190930073945
MDC IDC PG IMPLANT DT: 20181112

## 2018-06-20 NOTE — Assessment & Plan Note (Signed)
Has had a hard time adjusting to retirement Discussed alternate means to have social connection On bupropion and trazodone for sleep

## 2018-06-20 NOTE — Assessment & Plan Note (Signed)
BP Readings from Last 3 Encounters:  06/20/18 122/84  06/19/18 132/62  05/07/18 140/62   Good control

## 2018-06-20 NOTE — Assessment & Plan Note (Signed)
New diagnosis--found on monitor, but now irregular On xarelto now and rate okay

## 2018-06-20 NOTE — Telephone Encounter (Signed)
Faxed CPAP referral to [facility]. Patient was seen yesterday by Dr [name] yesterday and it was discovered that patient was never set up on his CPAP therapy. Titration study was read, however orders were never given to me for set up.

## 2018-06-20 NOTE — Assessment & Plan Note (Signed)
Will be starting CPAP therapy

## 2018-06-20 NOTE — Assessment & Plan Note (Signed)
Discussed tylenol Considering TKR

## 2018-06-20 NOTE — Assessment & Plan Note (Signed)
Mild symptoms On Rx

## 2018-06-20 NOTE — Progress Notes (Signed)
Subjective:    Patient ID: Jared [name], male    DOB: 1940/11/17, 77 y.o.   MRN: [id number]  HPI Here to establish ---transferring care  Has bad knee---needs TKR but not ready for this Right knee Can't bend down on knee---like to work on lawnmower Can walk okay-- "probably unlimited"--no longer runs or bicycles Mows 2 acres--- but will have some pain issues during this  Just started xarelto by Dr Lovena Le Atrial fibrillation found on his monitor No chest pain, SOB Rarely feels an extra beat  Known OSA Had sleep study in May--but results lost Will be starting CPAP soon (Dr Lovena Le working on this?)  Chronic depression--- fairly severe at times First spell in adolescence but wasn't aware of what was happening Better if busy--that is an issue now in retirement Past psychiatrist but not recently Chronic sleep problems---trazodone some help Also with anxiety--- written for tid but he sometimes only takes it bid  On finasteride Satisfied with flow but still has increased frequency Nocturia 0-3  Current Outpatient Medications on File Prior to Visit  Medication Sig Dispense Refill  . ALPRAZolam (XANAX) 0.5 MG tablet Take 1 tablet (0.5 mg total) by mouth 3 (three) times daily. 90 tablet 2  . amLODipine (NORVASC) 10 MG tablet TAKE 1 TABLET BY MOUTH EVERY DAY 90 tablet 1  . atorvastatin (LIPITOR) 80 MG tablet Take 80 mg by mouth daily at 6 PM.    . buPROPion (WELLBUTRIN SR) 150 MG 12 hr tablet Take 150 mg by mouth 2 (two) times daily.    . carvedilol (COREG) 6.25 MG tablet Take 1 tablet (6.25 mg total) by mouth 2 (two) times daily with a meal. 180 tablet 2  . finasteride (PROSCAR) 5 MG tablet TAKE 1 TABLET (5 MG TOTAL) BY MOUTH DAILY. 90 tablet 1  . fluorometholone (FML) 0.1 % ophthalmic suspension Place 1 drop 3 (three) times daily into both eyes.  0  . losartan (COZAAR) 50 MG tablet TAKE 1 TABLET BY MOUTH EVERY DAY 90 tablet 0  . omeprazole (PRILOSEC) 10 MG capsule TAKE ONE CAPSULE BY  MOUTH EVERY DAY 90 capsule 3  . RESTASIS 0.05 % ophthalmic emulsion Place 1 drop 2 (two) times daily into both eyes.  4  . rivaroxaban (XARELTO) 20 MG TABS tablet Take 1 tablet (20 mg total) by mouth daily with supper. 90 tablet 3  . timolol (TIMOPTIC) 0.5 % ophthalmic solution Place 1 drop daily into both eyes.  0  . traZODone (DESYREL) 100 MG tablet Take 100 mg at bedtime by mouth.      No current facility-administered medications on file prior to visit.     No Known Allergies  Past Medical History:  Diagnosis Date  . ALLERGIC RHINITIS 10/05/2007  . ANXIETY 10/05/2007  . Atrial fibrillation ([place])   . BENIGN PROSTATIC HYPERTROPHY 04/08/2009  . Benzodiazepine dependence ([place])   . COLONIC POLYPS, HX OF 10/05/2007  . DEPRESSION 10/05/2007  . GERD 10/05/2007  . Heart murmur   . HYPERTENSION 10/05/2007  . MDD (major depressive disorder), recurrent, in partial remission ([place])   . NEPHROLITHIASIS, HX OF 10/05/2007  . SLEEP APNEA, OBSTRUCTIVE 10/02/2008  . Stroke [facility])     Past Surgical History:  Procedure Laterality Date  . APPENDECTOMY    . CATARACT EXTRACTION  2014   bilateral  . CHOLECYSTECTOMY    . FOOT SURGERY     left  . HERNIA REPAIR  2014  . LOOP RECORDER INSERTION N/A 07/31/2017   Procedure:  LOOP RECORDER INSERTION;  Surgeon: Evans Lance, MD;  Location: [place] [facility] LAB;  Service: Cardiovascular;  Laterality: N/A;  . TEE WITHOUT CARDIOVERSION N/A 07/31/2017   Procedure: TRANSESOPHAGEAL ECHOCARDIOGRAM (TEE) WITH LOOP;  Surgeon: Dorothy Spark, MD;  Location: [facility] ENDOSCOPY;  Service: Cardiovascular;  Laterality: N/A;  . WRIST FRACTURE SURGERY     right x2   left x1    Family History  Problem Relation Age of Onset  . Colon cancer Maternal Aunt 70    Social History   Socioeconomic History  . Marital status: Widowed    Spouse name: Not on file  . Number of children: 2  . Years of education: Not on file  . Highest education level: Not on file  Occupational  History  . 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 Financial resource strain: Not on file  . Food insecurity:    Worry: Not on file    Inability: Not on file  . Transportation needs:    Medical: Not on file    Non-medical: Not on file  Tobacco Use  . Smoking status: Never Smoker  . Smokeless tobacco: Never Used  Substance and Sexual Activity  . Alcohol use: No  . Drug use: No  . Sexual activity: Not on file  Lifestyle  . Physical activity:    Days per week: Not on file    Minutes per session: Not on file  . Stress: Not on file  Relationships  . Social connections:    Talks on phone: Not on file    Gets together: Not on file    Attends religious service: Not on file    Active member of club or organization: Not on file    Attends meetings of clubs or organizations: Not on file    Relationship status: Not on file  . Intimate partner violence:    Fear of current or ex partner: Not on file    Emotionally abused: Not on file    Physically abused: Not on file    Forced sexual activity: Not on file  Other Topics Concern  . Not on file  Social History Narrative   Divorced twice---first wife died   2 children---daughter local, son in [place]      Has living will   [place] is daughter   Would accept resuscitation   No prolonged tube feeds if cognitively unaware   Review of Systems Appetite is fine Weight mostly stable---down a little this year Bowels are slow--- uses OTC laxative prn Chronic low back pain and right knee     Objective:   Physical Exam  Constitutional: He appears well-developed. No distress.  HENT:  Mouth/Throat: Oropharynx is clear and moist. No oropharyngeal exudate.  Neck: No thyromegaly present.  Cardiovascular: Normal rate, normal heart sounds and intact distal pulses. Exam reveals no gallop.  No murmur heard. irregular  Respiratory: Effort normal and breath sounds normal. No respiratory distress. He has no  wheezes. He has no rales.  GI: Soft. There is no tenderness.  Musculoskeletal: He exhibits no edema or tenderness.  Lymphadenopathy:    He has no cervical adenopathy.  Skin: No rash noted. No erythema.  Psychiatric: He has a normal mood and affect. His behavior is normal.           Assessment & Plan:

## 2018-06-20 NOTE — Assessment & Plan Note (Signed)
Uses the xanax regularly Asked him to try to limit and use more on prn basis

## 2018-06-20 NOTE — Patient Instructions (Signed)
Please consider using miralax every day or every other day to keep your bowels regular

## 2018-07-05 DIAGNOSIS — G4733 Obstructive sleep apnea (adult) (pediatric): Secondary | ICD-10-CM | POA: Diagnosis not present

## 2018-07-17 ENCOUNTER — Ambulatory Visit: Payer: Self-pay

## 2018-07-17 DIAGNOSIS — M25562 Pain in left knee: Secondary | ICD-10-CM | POA: Diagnosis not present

## 2018-07-19 ENCOUNTER — Encounter (HOSPITAL_COMMUNITY): Payer: Self-pay | Admitting: Emergency Medicine

## 2018-07-19 ENCOUNTER — Other Ambulatory Visit: Payer: Self-pay

## 2018-07-19 ENCOUNTER — Emergency Department (HOSPITAL_COMMUNITY)
Admission: EM | Admit: 2018-07-19 | Discharge: 2018-07-19 | Disposition: A | Discharge status: Home / Self Care | Payer: PPO | Attending: Emergency Medicine | Admitting: Emergency Medicine

## 2018-07-19 ENCOUNTER — Emergency Department (HOSPITAL_COMMUNITY): Payer: PPO

## 2018-07-19 DIAGNOSIS — I10 Essential (primary) hypertension: Secondary | ICD-10-CM | POA: Diagnosis not present

## 2018-07-19 DIAGNOSIS — M25562 Pain in left knee: Secondary | ICD-10-CM | POA: Diagnosis not present

## 2018-07-19 DIAGNOSIS — M1712 Unilateral primary osteoarthritis, left knee: Secondary | ICD-10-CM | POA: Diagnosis not present

## 2018-07-19 DIAGNOSIS — Z79899 Other long term (current) drug therapy: Secondary | ICD-10-CM | POA: Insufficient documentation

## 2018-07-19 DIAGNOSIS — M25561 Pain in right knee: Secondary | ICD-10-CM | POA: Diagnosis not present

## 2018-07-19 MED ORDER — DICLOFENAC SODIUM 1 % TD GEL: 4.0000 g | Freq: Four times a day (QID) | TRANSDERMAL | 1 refills | Status: DC

## 2018-07-19 NOTE — ED Triage Notes (Signed)
Pt reports left knee pain that started one week ago. Denies any injuries. Pt went to his orthopedist and received some injections with no relief. Pain 9/10. Pt ambulatory with cane in triage. Hx of Afib.

## 2018-07-19 NOTE — Discharge Instructions (Signed)
Please read and follow all provided instructions.  Your diagnoses today include:  1. Acute pain of left knee   2. Primary osteoarthritis of left knee     Tests performed today include:  An x-ray of the affected area -shows tricompartmental osteoarthritis of your knee and a small amount of fluid  Vital signs. See below for your results today.   Medications prescribed:   Voltaren gel -topical anti-inflammatory  Take any prescribed medications only as directed.  Home care instructions:   Follow any educational materials contained in this packet  Follow R.I.C.E. Protocol:  R - rest your injury   I  - use ice on injury without applying directly to skin  C - compress injury with bandage or splint  E - elevate the injury as much as possible  Follow-up instructions: Please follow-up with your orthopedic physician (bone specialist) in the next several days for reevaluation.  Return instructions:   Please return to the Emergency Department if you experience worsening symptoms.   Please return if you have any other emergent concerns.  Additional Information:  Your vital signs today were: BP (!) 143/73 (BP Location: Right Arm)    Pulse (!) 50    Temp 98.7 F (37.1 C) (Oral)    Resp 16    Ht 5\' 10"  (1.778 m)    Wt 81.2 kg    SpO2 97%    BMI 25.68 kg/m  If your blood pressure (BP) was elevated above 135/85 this visit, please have this repeated by your doctor within one month. --------------

## 2018-07-19 NOTE — ED Notes (Signed)
Patient transported to X-ray 

## 2018-07-19 NOTE — ED Provider Notes (Signed)
[facility] EMERGENCY DEPARTMENT Provider Note   CSN: [id number] Arrival date & time: 07/19/18  1952     History   Chief Complaint Chief Complaint  Patient presents with  . Knee Pain    HPI Jared [name] is a 77 y.o. male.  Patient with history of stroke, atrial fibrillation, on anticoagulation presents the emergency department with complaint of left knee pain.  Symptoms started approximately 1 week ago without any acute injury.  Patient has not noted any fevers, swelling of the knee.  Patient saw his orthopedic doctor, Dr. Alma Friendly, who gave him an injection into the knee.  He has not received any relief from the steroid injection.  Pain is worse with movement.  It feels like the inside of his knee is grinding.  No numbness or tingling distally.  No weakness.  Pain is worse with ambulation.   Knee Pain   Pertinent negatives include no numbness.    Past Medical History:  Diagnosis Date  . ALLERGIC RHINITIS 10/05/2007  . ANXIETY 10/05/2007  . Atrial fibrillation ([place])   . BENIGN PROSTATIC HYPERTROPHY 04/08/2009  . Benzodiazepine dependence ([place])   . COLONIC POLYPS, HX OF 10/05/2007  . DEPRESSION 10/05/2007  . GERD 10/05/2007  . Heart murmur   . HYPERTENSION 10/05/2007  . MDD (major depressive disorder), recurrent, in partial remission ([place])   . NEPHROLITHIASIS, HX OF 10/05/2007  . SLEEP APNEA, OBSTRUCTIVE 10/02/2008  . Stroke [facility])     Patient Active Problem List   Diagnosis Date Noted  . Osteoarthritis of right knee 06/20/2018  . Atrial fibrillation ([place])   . MDD (major depressive disorder), recurrent, in partial remission ([place])   . Benzodiazepine dependence ([place])   . Ejection fraction < 50% 11/07/2017  . Ischemic cardiomyopathy   . Hyperlipidemia   . Dizziness 07/27/2017  . Frequent PVCs 07/27/2017  . Cryptogenic stroke ([place]) 07/27/2017  . Diverticulosis of colon without hemorrhage 02/06/2013  . Right inguinal hernia 10/12/2012  . BPH (benign prostatic  hyperplasia) 04/08/2009  . OSA (obstructive sleep apnea) 10/02/2008  . Essential hypertension 10/05/2007  . Allergic rhinitis 10/05/2007  . GERD 10/05/2007  . History of colonic polyps 10/05/2007  . NEPHROLITHIASIS, HX OF 10/05/2007    Past Surgical History:  Procedure Laterality Date  . APPENDECTOMY    . CATARACT EXTRACTION  2014   bilateral  . CHOLECYSTECTOMY    . FOOT SURGERY     left  . HERNIA REPAIR  2014  . LOOP RECORDER INSERTION N/A 07/31/2017   Procedure: LOOP RECORDER INSERTION;  Surgeon: Evans Lance, MD;  Location: [facility];  Service: Cardiovascular;  Laterality: N/A;  . TEE WITHOUT CARDIOVERSION N/A 07/31/2017   Procedure: TRANSESOPHAGEAL ECHOCARDIOGRAM (TEE) WITH LOOP;  Surgeon: Dorothy Spark, MD;  Location: [facility];  Service: Cardiovascular;  Laterality: N/A;  . WRIST FRACTURE SURGERY     right x2   left x1        Home Medications    Prior to Admission medications   Medication Sig Start Date End Date Taking? Authorizing Provider  ALPRAZolam Duanne Moron) 0.5 MG tablet Take 1 tablet (0.5 mg total) by mouth 3 (three) times daily. 05/07/18   Marletta Lor, MD  amLODipine (NORVASC) 10 MG tablet TAKE 1 TABLET BY MOUTH EVERY DAY 02/13/18   Marletta Lor, MD  atorvastatin (LIPITOR) 80 MG tablet Take 80 mg by mouth daily at 6 PM.    [provider]  buPROPion [facility] SR) 150  MG 12 hr tablet Take 150 mg by mouth 2 (two) times daily.    [provider]  carvedilol (COREG) 6.25 MG tablet Take 1 tablet (6.25 mg total) by mouth 2 (two) times daily with a meal. 05/07/18   Marletta Lor, MD  diclofenac sodium (VOLTAREN) 1 % GEL Apply 4 g topically 4 (four) times daily. 07/19/18   Carlisle Cater, PA-C  finasteride (PROSCAR) 5 MG tablet TAKE 1 TABLET (5 MG TOTAL) BY MOUTH DAILY. 01/17/18   Marletta Lor, MD  fluorometholone (FML) 0.1 % ophthalmic suspension Place 1 drop 3 (three) times daily into both eyes. 04/26/17    [provider]  losartan (COZAAR) 50 MG tablet TAKE 1 TABLET BY MOUTH EVERY DAY 04/30/18   Marletta Lor, MD  omeprazole (PRILOSEC) 10 MG capsule TAKE ONE CAPSULE BY MOUTH EVERY DAY 02/23/18   Marletta Lor, MD  RESTASIS 0.05 % ophthalmic emulsion Place 1 drop 2 (two) times daily into both eyes. 08/03/17   [provider]  rivaroxaban (XARELTO) 20 MG TABS tablet Take 1 tablet (20 mg total) by mouth daily with supper. 06/19/18   Evans Lance, MD  timolol (TIMOPTIC) 0.5 % ophthalmic solution Place 1 drop daily into both eyes. 06/29/17   [provider]  traZODone (DESYREL) 100 MG tablet Take 100 mg at bedtime by mouth.  11/24/14   [provider]    Family History Family History  Problem Relation Age of Onset  . Heart disease Mother   . Heart disease Father   . Colon cancer Maternal Aunt 70    Social History Social History   Tobacco Use  . Smoking status: Never Smoker  . Smokeless tobacco: Never Used  Substance Use Topics  . Alcohol use: No  . Drug use: No     Allergies   Patient has no known allergies.   Review of Systems Review of Systems  Constitutional: Negative for activity change.  Musculoskeletal: Positive for arthralgias. Negative for back pain, gait problem, joint swelling and neck pain.  Skin: Negative for wound.  Neurological: Negative for weakness and numbness.     Physical Exam Updated Vital Signs BP (!) 143/73 (BP Location: Right Arm)   Pulse (!) 50   Temp 98.7 F (37.1 C) (Oral)   Resp 16   Ht 5\' 10"  (1.778 m)   Wt 81.2 kg   SpO2 97%   BMI 25.68 kg/m   Physical Exam  Constitutional: He appears well-developed and well-nourished.  HENT:  Head: Normocephalic and atraumatic.  Eyes: Conjunctivae are normal.  Neck: Normal range of motion. Neck supple.  Cardiovascular: Normal pulses. Exam reveals no decreased pulses.  Musculoskeletal: He exhibits tenderness. He exhibits no edema.       Left hip:  Normal.       Left knee: He exhibits normal range of motion, no swelling and no effusion. Tenderness found. Medial joint line tenderness noted. No lateral joint line tenderness noted.       Left ankle: Normal.  Neurological: He is alert. No sensory deficit.  Motor, sensation, and vascular distal to the injury is fully intact.   Skin: Skin is warm and dry.  Psychiatric: He has a normal mood and affect.  Nursing note and vitals reviewed.    ED Treatments / Results  Labs (all labs ordered are listed, but only abnormal results are displayed) Labs Reviewed - No data to display  EKG None  Radiology Dg Knee Complete 4 Views Left  Result Date: 07/19/2018 CLINICAL DATA:  Left knee pain starting 1 week ago. EXAM: LEFT KNEE - COMPLETE 4+ VIEW COMPARISON:  None. FINDINGS: Moderate femorotibial and mild patellofemoral joint space narrowing likely degenerative in etiology without acute fracture or joint dislocation. No suspicious osseous lesions. Small joint effusion is suggested. IMPRESSION: Mild tricompartmental osteoarthritic joint space narrowing of the left knee. No acute osseous abnormality. Probable small joint effusion. Electronically Signed   By: Ashley Royalty M.D.   On: 07/19/2018 22:38    Procedures Procedures (including critical care time)  Medications Ordered in ED Medications - No data to display   Initial Impression / Assessment and Plan / ED Course  I have reviewed the triage vital signs and the nursing notes.  Pertinent labs & imaging results that were available during my care of the patient were reviewed by me and considered in my medical decision making (see chart for details).     Patient seen and examined.   Vital signs reviewed and are as follows: BP (!) 143/73 (BP Location: Right Arm)   Pulse (!) 50   Temp 98.7 F (37.1 C) (Oral)   Resp 16   Ht 5\' 10"  (1.778 m)   Wt 81.2 kg   SpO2 97%   BMI 25.68 kg/m   X-ray reviewed with patient at bedside.  Discussed  use of 1 g of Tylenol 3 times a day, topical NSAIDs with diclofenac gel, rest, rice protocol at this time.  Discussed avoidance of stronger pain medications as patient lives alone.  He agrees with this.  Encouraged orthopedic follow-up, return to the emergency department with fever, worsening swelling of the knee.  Final Clinical Impressions(s) / ED Diagnoses   Final diagnoses:  Acute pain of left knee  Primary osteoarthritis of left knee   Patient with left knee pain, no fracture.  Possibly from osteoarthritis.  Do not suspect inflammatory arthritis or gout or septic arthritis.  Lower extremity is neurovascularly intact.  Will attempt to maximize pain control with the above measures and have patient follow up again with his orthopedist.  ED Discharge Orders         Ordered    diclofenac sodium (VOLTAREN) 1 % GEL  4 times daily     07/19/18 2255           Carlisle Cater, PA-C 07/19/18 2300    Hayden Rasmussen, MD 07/20/18 1145

## 2018-07-23 ENCOUNTER — Ambulatory Visit (INDEPENDENT_AMBULATORY_CARE_PROVIDER_SITE_OTHER): Payer: PPO | Admitting: *Deleted

## 2018-07-23 ENCOUNTER — Other Ambulatory Visit: Payer: Self-pay | Admitting: Internal Medicine

## 2018-07-23 DIAGNOSIS — I639 Cerebral infarction, unspecified: Secondary | ICD-10-CM | POA: Diagnosis not present

## 2018-07-23 NOTE — Progress Notes (Signed)
Carelink Summary Report / Loop Recorder 

## 2018-07-24 ENCOUNTER — Telehealth: Payer: Self-pay
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
## 2018-08-05 DIAGNOSIS — G4733 Obstructive sleep apnea (adult) (pediatric): Secondary | ICD-10-CM | POA: Diagnosis not present

## 2018-08-06 ENCOUNTER — Encounter: Payer: Self-pay | Admitting: Adult Health

## 2018-08-06 ENCOUNTER — Ambulatory Visit (INDEPENDENT_AMBULATORY_CARE_PROVIDER_SITE_OTHER): Payer: PPO | Admitting: Adult Health

## 2018-08-06 VITALS — BP 129/82 | HR 62 | Ht 70.0 in | Wt 187.2 lb

## 2018-08-06 DIAGNOSIS — I480 Paroxysmal atrial fibrillation: Secondary | ICD-10-CM | POA: Diagnosis not present

## 2018-08-06 DIAGNOSIS — I639 Cerebral infarction, unspecified: Secondary | ICD-10-CM | POA: Diagnosis not present

## 2018-08-06 DIAGNOSIS — E785 Hyperlipidemia, unspecified: Secondary | ICD-10-CM

## 2018-08-06 DIAGNOSIS — G4733 Obstructive sleep apnea (adult) (pediatric): Secondary | ICD-10-CM

## 2018-08-06 DIAGNOSIS — I10 Essential (primary) hypertension: Secondary | ICD-10-CM | POA: Diagnosis not present

## 2018-08-06 NOTE — Patient Instructions (Signed)
Continue Xarelto (rivaroxaban) daily  and lipitor  for secondary stroke prevention  Continue to follow up with cardiology for atrial fibrillation and Xarelto management  Continue to follow up with PCP regarding cholesterol and blood pressure management   Continue use of CPAP for your sleep apnea and contact your provider if you are having difficulty tolerating   Continue to stay active and maintain a healthy diet  Continue to monitor blood pressure at home  Maintain strict control of hypertension with blood pressure goal below 130/90, diabetes with hemoglobin A1c goal below 6.5% and cholesterol with LDL cholesterol (bad cholesterol) goal below 70 mg/dL. I also advised the patient to eat a healthy diet with plenty of whole grains, cereals, fruits and vegetables, exercise regularly and maintain ideal body weight.  Followup in the future with me as needed or call earlier if needed       Thank you for coming to see [place] at [facility]. I hope we have been able to provide you high quality care today.  You may receive a patient satisfaction survey over the next few weeks. We would appreciate your feedback and comments so that we may continue to improve ourselves and the health of our patients.

## 2018-08-06 NOTE — Progress Notes (Signed)
I agree with the above plan 

## 2018-08-06 NOTE — Progress Notes (Signed)
[facility] [address] [place]. [place] [address] [phone]       OFFICE FOLLOW UP NOTE  [name] Date of Birth:  [birth year] Medical Record Number:  [id number]   Reason for Referral:  Hospital stroke follow up  CHIEF COMPLAINT:  Chief Complaint  Patient presents with  . Follow-up    Stroke follow up room in back hallway pt alone    HPI: Jared [name] is being seen today in the office for cryptogenic stroke on 07/26/17. History obtained from patient and chart review. Reviewed all radiology images and labs personally. Jared [name] is a 77 y.o. male with PMH of HTN, OSA, BPH admitted for episode of dizziness and fall at home on 07/26/17.  CT of head showed no acute intracranial abnormalities.  CTA of head and neck showed no branch occlusion, stenosis, or visible embolic source.  MRI did show a small acute infarct in the left caudate body.  2D echo showed an EF of 35-40%.  LDL 102.  HbA1c 5.3.  Patient had no antithrombotic prior to admission and was discharged on aspirin 325 mg daily.  Due to possible cardioembolic source with a low EF of 35-40%, TEE was done which was negative for cardioembolic source and a loop recorder was placed.  Patient was started on 40 mg daily of Lipitor.  Patient discharged home in stable condition with recommendation of home PT/OT.   11/07/17 Visit: Since discharge, patient states he has been doing well.  Patient continues to take aspirin 325 mg and denies increase in bleeding and bruising.  Patient was increased from Lipitor 40 mg to 80 mg by PCP.  He is tolerating this well without myalgias.  Patient does have history of OSA but unable to tolerate mask.  Loop recorder still in place without evidence of atrial fibrillation episodes at this time.  Patient states he does get slightly dizzy with bending over but denies recent falls.  Patient did have home PT for 3-4 sessions but states this was not helpful for him.  Patient also states he has  mild memory loss where he may place something somewhere and forget about it.  Patient does currently live alone but has a daughter in the area.  Patient is questioning whether he can have right knee arthroscopy.  As this is an elective procedure and not emergent, patient was advised to wait another 3 months as that will make it a total of 6 months post stroke.  Patient in agreement to this.  Patient denies new or worsening stroke/TIA symptoms.  01/31/18 UPDATE: Patient returns today for 58-month follow-up visit.  From a stroke standpoint he is doing well.  He continues to take aspirin without side effects of bleeding or bruising.  Continues to take Lipitor without side effects of myalgias.  Blood pressure at today's visit satisfactory at 143/80.  Patient has recently undergone sleep study on 12/20/2017 and was found to have sleep apnea.  Patient had split sleep study on 01/19/2018 but has not obtained results for this yet.  Loop recorder has not found atrial fibrillation thus far.  Patient does have concerns for worsening depression over the past several years.  He is currently on Wellbutrin which is prescribed by psychiatrist.  He has a difficult time getting motivated to be active/social and to work on needed things around the house.  Patient does live on his own.  He has not followed up with psychiatrist recently and this was highly recommended for  possible change in depression management.  Patient is in agreement to this plan.  He does not feel as though the depression has gotten worse since his stroke but just has slowly been getting worse over the past several years.  Patient denies suicidal ideation or past suicide attempts.  He does not sleep well at night and feels tired throughout most of the day and this could be leading to part of his depression and complains of memory loss.  Patient complains of age-related memory loss such as today where his appointment was originally scheduled for 02/05/2018 and he saw  this on the calendar but patient still came to appointment thinking it was today (01/31/2018).  Explained to patient that it will be important for him to obtain results from sleep study if he does need CPAP machine in order to get better rest at night which can help improve both his depression and memory loss.  Also advised patient the importance of following up with psychiatrist for possible change in depression management.  Patient denies new or worsening stroke/TIA symptoms.  Interval history 08/06/2018: Patient is being seen today for six-month follow-up.  He continues to do well from a stroke standpoint without residual deficits or recurring of symptoms.  He does endorse increased left knee pain along with continued right knee pain.  He was receiving injections but did not receive benefit.  He does state he may possibly need surgery in the future.  His loop recorder did show evidence of atrial fibrillation on 05/17/18 and was started on Xarelto on 06/19/18 (f/u visit with Dr. Lovena Le). He denies bleeding or bruising on Xarelto. Continues to take lipitor without side effects of myalgais. Blood pressure today satisfactory at 129/82. He has started using CPAP and does endorse compliance. No further concerns at this time. Denies new or worsening stroke/TIA symptoms.       ROS:   14 system review of systems performed and negative with exception of ringing in ears, constipation, apnea, joint pain and depression   PMH:  Past Medical History:  Diagnosis Date  . ALLERGIC RHINITIS 10/05/2007  . ANXIETY 10/05/2007  . Atrial fibrillation ([place])   . BENIGN PROSTATIC HYPERTROPHY 04/08/2009  . Benzodiazepine dependence ([place])   . COLONIC POLYPS, HX OF 10/05/2007  . DEPRESSION 10/05/2007  . GERD 10/05/2007  . Heart murmur   . HYPERTENSION 10/05/2007  . MDD (major depressive disorder), recurrent, in partial remission ([place])   . NEPHROLITHIASIS, HX OF 10/05/2007  . SLEEP APNEA, OBSTRUCTIVE 10/02/2008  . Stroke [facility])      PSH:  Past Surgical History:  Procedure Laterality Date  . APPENDECTOMY    . CATARACT EXTRACTION  2014   bilateral  . CHOLECYSTECTOMY    . FOOT SURGERY     left  . HERNIA REPAIR  2014  . LOOP RECORDER INSERTION N/A 07/31/2017   Procedure: LOOP RECORDER INSERTION;  Surgeon: Evans Lance, MD;  Location: [place] [facility];  Service: Cardiovascular;  Laterality: N/A;  . TEE WITHOUT CARDIOVERSION N/A 07/31/2017   Procedure: TRANSESOPHAGEAL ECHOCARDIOGRAM (TEE) WITH LOOP;  Surgeon: Dorothy Spark, MD;  Location: [facility];  Service: Cardiovascular;  Laterality: N/A;  . WRIST FRACTURE SURGERY     right x2   left x1    Social History:  Social History   Socioeconomic History  . Marital status: Widowed    Spouse name: Not on file  . Number of children: 2  . Years of education: Not on file  . Highest  education level: Not on file  Occupational History  . 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 Financial resource strain: Not on file  . Food insecurity:    Worry: Not on file    Inability: Not on file  . Transportation needs:    Medical: Not on file    Non-medical: Not on file  Tobacco Use  . Smoking status: Never Smoker  . Smokeless tobacco: Never Used  Substance and Sexual Activity  . Alcohol use: No  . Drug use: No  . Sexual activity: Not on file  Lifestyle  . Physical activity:    Days per week: Not on file    Minutes per session: Not on file  . Stress: Not on file  Relationships  . Social connections:    Talks on phone: Not on file    Gets together: Not on file    Attends religious service: Not on file    Active member of club or organization: Not on file    Attends meetings of clubs or organizations: Not on file    Relationship status: Not on file  . Intimate partner violence:    Fear of current or ex partner: Not on file    Emotionally abused: Not on file    Physically abused: Not on file    Forced sexual activity: Not  on file  Other Topics Concern  . Not on file  Social History Narrative   Divorced twice---first wife died   2 children---daughter local, son in [place]      Has living will   [place] is daughter   Would accept resuscitation   No prolonged tube feeds if cognitively unaware    Family History:  Family History  Problem Relation Age of Onset  . Heart disease Mother   . Heart disease Father   . Colon cancer Maternal Aunt 70    Medications:   Current Outpatient Medications on File Prior to Visit  Medication Sig Dispense Refill  . ALPRAZolam (XANAX) 0.5 MG tablet Take 1 tablet (0.5 mg total) by mouth 3 (three) times daily. 90 tablet 2  . amLODipine (NORVASC) 10 MG tablet TAKE 1 TABLET BY MOUTH EVERY DAY 90 tablet 1  . atorvastatin (LIPITOR) 80 MG tablet Take 80 mg by mouth daily at 6 PM.    . buPROPion (WELLBUTRIN SR) 150 MG 12 hr tablet Take 150 mg by mouth 2 (two) times daily.    . carvedilol (COREG) 6.25 MG tablet Take 1 tablet (6.25 mg total) by mouth 2 (two) times daily with a meal. 180 tablet 2  . diclofenac sodium (VOLTAREN) 1 % GEL Apply 4 g topically 4 (four) times daily. 1 Tube 1  . finasteride (PROSCAR) 5 MG tablet TAKE 1 TABLET (5 MG TOTAL) BY MOUTH DAILY. 90 tablet 1  . fluorometholone (FML) 0.1 % ophthalmic suspension Place 1 drop 3 (three) times daily into both eyes.  0  . losartan (COZAAR) 50 MG tablet TAKE 1 TABLET BY MOUTH EVERY DAY 90 tablet 0  . omeprazole (PRILOSEC) 10 MG capsule TAKE ONE CAPSULE BY MOUTH EVERY DAY 90 capsule 3  . RESTASIS 0.05 % ophthalmic emulsion Place 1 drop 2 (two) times daily into both eyes.  4  . rivaroxaban (XARELTO) 20 MG TABS tablet Take 1 tablet (20 mg total) by mouth daily with supper. 90 tablet 3  . timolol (TIMOPTIC) 0.5 % ophthalmic solution Place 1 drop daily into both eyes.  0  . traZODone (DESYREL) 100 MG tablet Take 100 mg at bedtime by mouth.      No current facility-administered medications on file prior to visit.       Allergies:  No Known Allergies  Physical Exam  Vitals:   08/06/18 1349  BP: 129/82  Pulse: 62  Weight: 187 lb 3.2 oz (84.9 kg)  Height: 5\' 10"  (1.778 m)   Body mass index is 26.86 kg/m. No exam data present  General: well developed, pleasant elderly Caucasian male,  well nourished, seated, in no evident distress Head: head normocephalic and atraumatic.   Neck: supple with no carotid or supraclavicular bruits Cardiovascular: regular rate and rhythm, no murmurs Musculoskeletal: no deformity Skin:  no rash/petichiae Vascular:  Normal pulses all extremities  Neurologic Exam Mental Status: Awake and fully alert. Oriented to place and time. Recent and remote memory intact. Attention span, concentration and fund of knowledge appropriate. Mood and affect appropriate.  Cranial Nerves: Fundoscopic exam deferred. Pupils equal, briskly reactive to light. Extraocular movements full without nystagmus. Visual fields full to confrontation. Hearing intact. Facial sensation intact. Face, tongue, palate moves normally and symmetrically.  Motor: Normal bulk and tone. Normal strength in all tested extremity muscles. Sensory.: intact to touch , pinprick , position and vibratory sensation.  Coordination: Rapid alternating movements normal in all extremities. Finger-to-nose and heel-to-shin performed accurately bilaterally. Gait and Station: Arises from chair without difficulty. Stance is normal. Gait demonstrates normal stride length and balance . Able to heel, toe and tandem walk without difficulty.  Reflexes: 1+ and symmetric. Toes downgoing.    Diagnostic Data (Labs, Imaging, Testing)  Nocturnal Polysomnogram 12/20/17 IMPRESSIONS - Mild obstructive sleep apnea occurred during this study (AHI = 10.1/h). - No significant central sleep apnea occurred during this study (CAI = 0.0/h). - Mild oxygen desaturation was noted during this study (Min O2 = 86.0%). - The patient snored with soft snoring  volume. - EKG findings include PVCs and PACs. - Clinically significant periodic limb movements did not occur during sleep. No significant associated arousals.  DIAGNOSIS - Obstructive Sleep Apnea (327.23 [G47.33 ICD-10]) - PACs - PVCs   ASSESSMENT: 77 y.o. year old male here with cryptogenic stroke.  Location consistent with small vessel disease but also possible cardioembolic source with low EF of 35-40% and frequent PACs and PVCs.  Vascular risk factors include hyperlipidemia, hypertension, and sleep apnea.  Patient returns today for follow-up visit and overall doing well from a stroke standpoint without residual deficit recurring of symptoms.  He was found to have atrial fibrillation and was started on Xarelto.   PLAN:  -Continue Xarelto (rivaroxaban) daily  and lipitor  for secondary stroke prevention -f/u with cardiology as scheduled for atrial fibrillation and Eliquis management -F/u with PCP regarding your HLD  and HTN management -f/u with OSA provider for continued CPAP management -continue to monitor BP at home -Maintain strict control of hypertension with blood pressure goal below 130/90, diabetes with hemoglobin A1c goal below 6.5% and cholesterol with LDL cholesterol (bad cholesterol) goal below 70 mg/dL. I also advised the patient to eat a healthy diet with plenty of whole grains, cereals, fruits and vegetables, exercise regularly and maintain ideal body weight.  Follow up as needed as stable from stroke standpoint but was advised to call with questions, concerns or need of future follow-up appointment  Greater than 50% of time during this 25 minute visit was spent on counseling,explanation of diagnosis of cryptogenic stroke, reviewing risk factor management of HLD,  HTN, and sleep apnea, planning of further management, discussion with patient and family and coordination of care  Venancio Poisson, [facility]  [facility] [address] [place],  [address]  Phone [phone] Fax [phone]

## 2018-08-11 ENCOUNTER — Other Ambulatory Visit: Payer: Self-pay | Admitting: Internal Medicine

## 2018-08-12 ENCOUNTER — Other Ambulatory Visit: Payer: Self-pay | Admitting: Internal Medicine

## 2018-08-15 ENCOUNTER — Other Ambulatory Visit: Payer: Self-pay | Admitting: Internal Medicine

## 2018-08-20 ENCOUNTER — Other Ambulatory Visit: Payer: Self-pay | Admitting: Internal Medicine

## 2018-08-20 NOTE — ED Provider Notes (Signed)
0.  [name] EMERG[facility] DEPARTMENT Provider Note   CSN: [id number] Arrival date & time: 02/28/18  1021     History   Chief Complaint Chief Complaint  Patient presents with  . Fatigue  . Tremors    HPI Jared [name] is a 77 y.o. male. Who presents with cc of weakness and tremors. The patien tran out of his xanax prescription 4 days ago. He takes 0.5 mg TID. The patient has felt anxious, tremulous and shaky  For the last 2.5 days. He denies hallucinations, or seizures. He denies headaches or other focal neurologic deficit.  Denies fevers, chills, myalgias, arthralgias. Denies DOE, SOB, chest tightness or pressure, radiation to left arm, jaw or back, or diaphoresis. Denies dysuria, flank pain, suprapubic pain, frequency, urgency, or hematuria. Denies light headedness, , visual disturbances. Denies abdominal pain, nausea, vomiting, diarrhea or constipation.  HPI  Past Medical History:  Diagnosis Date  . ALLERGIC RHINITIS 10/05/2007  . ANXIETY 10/05/2007  . Atrial fibrillation ([place])   . BENIGN PROSTATIC HYPERTROPHY 04/08/2009  . Benzodiazepine dependence ([place])   . COLONIC POLYPS, HX OF 10/05/2007  . DEPRESSION 10/05/2007  . GERD 10/05/2007  . Heart murmur   . HYPERTENSION 10/05/2007  . MDD (major depressive disorder), recurrent, in partial remission ([place])   . NEPHROLITHIASIS, HX OF 10/05/2007  . SLEEP APNEA, OBSTRUCTIVE 10/02/2008  . Stroke [facility])     Patient Active Problem List   Diagnosis Date Noted  . Osteoarthritis of right knee 06/20/2018  . Atrial fibrillation ([place])   . MDD (major depressive disorder), recurrent, in partial remission ([place])   . Benzodiazepine dependence ([place])   . Ejection fraction < 50% 11/07/2017  . Ischemic cardiomyopathy   . Hyperlipidemia   . Dizziness 07/27/2017  . Frequent PVCs 07/27/2017  . Cryptogenic stroke ([place]) 07/27/2017  . Diverticulosis of colon without hemorrhage 02/06/2013  . Right inguinal hernia 10/12/2012  . BPH  (benign prostatic hyperplasia) 04/08/2009  . OSA (obstructive sleep apnea) 10/02/2008  . Essential hypertension 10/05/2007  . Allergic rhinitis 10/05/2007  . GERD 10/05/2007  . History of colonic polyps 10/05/2007  . NEPHROLITHIASIS, HX OF 10/05/2007    Past Surgical History:  Procedure Laterality Date  . APPENDECTOMY    . CATARACT EXTRACTION  2014   bilateral  . CHOLECYSTECTOMY    . FOOT SURGERY     left  . HERNIA REPAIR  2014  . LOOP RECORDER INSERTION N/A 07/31/2017   Procedure: LOOP RECORDER INSERTION;  Surgeon: Evans Lance, MD;  Location: [place] [facility];  Service: Cardiovascular;  Laterality: N/A;  . TEE WITHOUT CARDIOVERSION N/A 07/31/2017   Procedure: TRANSESOPHAGEAL ECHOCARDIOGRAM (TEE) WITH LOOP;  Surgeon: Dorothy Spark, MD;  Location: [place];  Service: Cardiovascular;  Laterality: N/A;  . WRIST FRACTURE SURGERY     right x2   left x1        Home Medications    Prior to Admission medications   Medication Sig Start Date End Date Taking? Authorizing Provider  ALPRAZolam Duanne Moron) 0.5 MG tablet Take 1 tablet (0.5 mg total) by mouth 3 (three) times daily. 05/07/18   Marletta Lor, MD  amLODipine (NORVASC) 10 MG tablet TAKE 1 TABLET BY MOUTH EVERY DAY 02/13/18   Marletta Lor, MD  atorvastatin (LIPITOR) 80 MG tablet Take 80 mg by mouth daily at 6 PM.    [provider]  buPROPion (WELLBUTRIN SR) 150 MG 12 hr tablet Take 150 mg by mouth 2 (two)  times daily.    [provider]  carvedilol (COREG) 6.25 MG tablet Take 1 tablet (6.25 mg total) by mouth 2 (two) times daily with a meal. 05/07/18   Marletta Lor, MD  diclofenac sodium (VOLTAREN) 1 % GEL Apply 4 g topically 4 (four) times daily. 07/19/18   Carlisle Cater, PA-C  finasteride (PROSCAR) 5 MG tablet TAKE 1 TABLET (5 MG TOTAL) BY MOUTH DAILY. 01/17/18   Marletta Lor, MD  fluorometholone (FML) 0.1 % ophthalmic suspension Place 1 drop 3 (three) times daily into  both eyes. 04/26/17   [provider]  losartan (COZAAR) 50 MG tablet TAKE 1 TABLET BY MOUTH EVERY DAY 04/30/18   Marletta Lor, MD  omeprazole (PRILOSEC) 10 MG capsule TAKE ONE CAPSULE BY MOUTH EVERY DAY 02/23/18   Marletta Lor, MD  RESTASIS 0.05 % ophthalmic emulsion Place 1 drop 2 (two) times daily into both eyes. 08/03/17   [provider]  rivaroxaban (XARELTO) 20 MG TABS tablet Take 1 tablet (20 mg total) by mouth daily with supper. 06/19/18   Evans Lance, MD  timolol (TIMOPTIC) 0.5 % ophthalmic solution Place 1 drop daily into both eyes. 06/29/17   [provider]  traZODone (DESYREL) 100 MG tablet Take 100 mg at bedtime by mouth.  11/24/14   [provider]    Family History Family History  Problem Relation Age of Onset  . Heart disease Mother   . Heart disease Father   . Colon cancer Maternal Aunt 70    Social History Social History   Tobacco Use  . Smoking status: Never Smoker  . Smokeless tobacco: Never Used  Substance Use Topics  . Alcohol use: No  . Drug use: No     Allergies   Patient has no known allergies.   Review of Systems Review of Systems Ten systems reviewed and are negative for acute change, except as noted in the HPI.    Physical Exam Updated Vital Signs BP (!) 152/63 (BP Location: Right Arm)   Pulse 78   Temp 98.2 F (36.8 C) (Oral)   Resp 18   Ht 5\' 10"  (1.778 m)   Wt 77.1 kg   SpO2 97%   BMI 24.39 kg/m   Physical Exam  Constitutional: He is oriented to person, place, and time. He appears well-developed and well-nourished. No distress.  HENT:  Head: Normocephalic and atraumatic.  Eyes: Conjunctivae are normal. No scleral icterus.  Neck: Normal range of motion. Neck supple.  Cardiovascular: Regular rhythm and normal heart sounds.  tachycardic  Pulmonary/Chest: Effort normal and breath sounds normal. No respiratory distress.  Abdominal: Soft. There is no tenderness.  Musculoskeletal: He  exhibits no edema.  Neurological: He is alert and oriented to person, place, and time.  Tremulous  Skin: Skin is warm and dry. He is not diaphoretic.  Psychiatric: His behavior is normal.  Anxious  Nursing note and vitals reviewed.    ED Treatments / Results  Labs (all labs ordered are listed, but only abnormal results are displayed) Labs Reviewed  BASIC METABOLIC PANEL - Abnormal; Notable for the following components:      Result Value   Potassium 3.3 (*)    Glucose, Bld 167 (*)    All other components within normal limits  CBC  URINALYSIS, ROUTINE W REFLEX MICROSCOPIC    EKG None  Radiology No results found.  Procedures Procedures (including critical care time)  Medications Ordered in ED Medications  ALPRAZolam Duanne Moron) tablet  0.5 mg (0.5 mg Oral Given 02/28/18 1126)     Initial Impression / Assessment and Plan / ED Course  I have reviewed the triage vital signs and the nursing notes.  Pertinent labs & imaging results that were available during my care of the patient were reviewed by me and considered in my medical decision making (see chart for details).  Clinical Course as of Aug 21 715  Wed Feb 28, 2018  1244 Patient symptoms have resolved with single dose of 0.5 mg Xanax.  Patient states he was several days early and has had that happen in the past but usually has week 1 day before his meds are refilled.  During this visit the drugstore called and said that his prescription is ready.  His symptoms have resolved he feels much improved and will go pick up his normal prescription.   [AH]  1258 Platelets: 157 [AH]    Clinical Course User Index [AH] Margarita Mail, PA-C     Final Clinical Impressions(s) / ED Diagnoses   Final diagnoses:  Benzodiazepine withdrawal without complication [facility])    ED Discharge Orders    None       Margarita Mail, PA-C 02/28/18 1634    Sherwood Gambler, MD 03/02/18 [place], [place], PA-C 03/28/18 1515      Kenton Kingfisher, [place], PA-C 08/20/18 0720    Sherwood Gambler, MD 08/23/18 [phone]

## 2018-08-22 ENCOUNTER — Other Ambulatory Visit: Payer: Self-pay | Admitting: Internal Medicine

## 2018-08-22 ENCOUNTER — Encounter: Payer: Self-pay | Admitting: Internal Medicine

## 2018-08-22 ENCOUNTER — Ambulatory Visit (INDEPENDENT_AMBULATORY_CARE_PROVIDER_SITE_OTHER): Payer: PPO | Admitting: Internal Medicine

## 2018-08-22 VITALS — BP 134/72 | HR 53 | Temp 98.2°F | Wt 181.0 lb

## 2018-08-22 DIAGNOSIS — R300 Dysuria: Secondary | ICD-10-CM | POA: Diagnosis not present

## 2018-08-22 DIAGNOSIS — N4822 Cellulitis of corpus cavernosum and penis: Secondary | ICD-10-CM

## 2018-08-22 LAB — POC URINALSYSI DIPSTICK (AUTOMATED)
BILIRUBIN UA: NEGATIVE
GLUCOSE UA: NEGATIVE
Ketones, UA: NEGATIVE
NITRITE UA: NEGATIVE
Protein, UA: POSITIVE — AB
SPEC GRAV UA: 1.025 (ref 1.010–1.025)
Urobilinogen, UA: 0.2 E.U./dL
pH, UA: 6 (ref 5.0–8.0)

## 2018-08-22 MED ORDER — CEPHALEXIN 250 MG PO CAPS: 250.0000 mg | ORAL_CAPSULE | Freq: Four times a day (QID) | ORAL | 0 refills | Status: DC

## 2018-08-22 NOTE — Patient Instructions (Signed)

## 2018-08-22 NOTE — Addendum Note (Signed)
Addended by: Lurlean Nanny on: 08/22/2018 05:02 PM   Modules accepted: Orders

## 2018-08-22 NOTE — Progress Notes (Signed)
Subjective:    Patient ID: Jared [name], male    DOB: May 22, 1941, 77 y.o.   MRN: [id number]  HPI  Pt presents to the clinic today with c/o penile swelling. He noticed this 2 days ago after he accidentally zipped the tip of his penis up in his pants. He is having some dysuria, but denies urgency, or frequency. He does have some urinary incontinence, wears depends. He denies fever, chills, nausea or low back pain. He denies testicular pain. He has tried Vaseline OTC with minimal relief.  Review of Systems      Past Medical History:  Diagnosis Date  . ALLERGIC RHINITIS 10/05/2007  . ANXIETY 10/05/2007  . Atrial fibrillation ([place])   . BENIGN PROSTATIC HYPERTROPHY 04/08/2009  . Benzodiazepine dependence ([place])   . COLONIC POLYPS, HX OF 10/05/2007  . DEPRESSION 10/05/2007  . GERD 10/05/2007  . Heart murmur   . HYPERTENSION 10/05/2007  . MDD (major depressive disorder), recurrent, in partial remission ([place])   . NEPHROLITHIASIS, HX OF 10/05/2007  . SLEEP APNEA, OBSTRUCTIVE 10/02/2008  . Stroke [facility])     Current Outpatient Medications  Medication Sig Dispense Refill  . ALPRAZolam (XANAX) 0.5 MG tablet Take 1 tablet (0.5 mg total) by mouth 3 (three) times daily. 90 tablet 2  . amLODipine (NORVASC) 10 MG tablet TAKE 1 TABLET BY MOUTH EVERY DAY 90 tablet 1  . atorvastatin (LIPITOR) 80 MG tablet Take 80 mg by mouth daily at 6 PM.    . buPROPion (WELLBUTRIN SR) 150 MG 12 hr tablet Take 150 mg by mouth 2 (two) times daily.    . carvedilol (COREG) 6.25 MG tablet Take 1 tablet (6.25 mg total) by mouth 2 (two) times daily with a meal. 180 tablet 2  . diclofenac sodium (VOLTAREN) 1 % GEL Apply 4 g topically 4 (four) times daily. 1 Tube 1  . finasteride (PROSCAR) 5 MG tablet TAKE 1 TABLET (5 MG TOTAL) BY MOUTH DAILY. 90 tablet 1  . fluorometholone (FML) 0.1 % ophthalmic suspension Place 1 drop 3 (three) times daily into both eyes.  0  . losartan (COZAAR) 50 MG tablet TAKE 1 TABLET BY MOUTH EVERY DAY 90  tablet 0  . omeprazole (PRILOSEC) 10 MG capsule TAKE ONE CAPSULE BY MOUTH EVERY DAY 90 capsule 3  . RESTASIS 0.05 % ophthalmic emulsion Place 1 drop 2 (two) times daily into both eyes.  4  . rivaroxaban (XARELTO) 20 MG TABS tablet Take 1 tablet (20 mg total) by mouth daily with supper. 90 tablet 3  . timolol (TIMOPTIC) 0.5 % ophthalmic solution Place 1 drop daily into both eyes.  0  . traZODone (DESYREL) 100 MG tablet Take 100 mg at bedtime by mouth.      No current facility-administered medications for this visit.     No Known Allergies  Family History  Problem Relation Age of Onset  . Heart disease Mother   . Heart disease Father   . Colon cancer Maternal Aunt 70    Social History   Socioeconomic History  . Marital status: Widowed    Spouse name: Not on file  . Number of children: 2  . Years of education: Not on file  . Highest education level: Not on file  Occupational History  . 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 Financial resource strain: Not on file  . Food insecurity:    Worry: Not on file    Inability: Not on file  .  Transportation needs:    Medical: Not on file    Non-medical: Not on file  Tobacco Use  . Smoking status: Never Smoker  . Smokeless tobacco: Never Used  Substance and Sexual Activity  . Alcohol use: No  . Drug use: No  . Sexual activity: Not on file  Lifestyle  . Physical activity:    Days per week: Not on file    Minutes per session: Not on file  . Stress: Not on file  Relationships  . Social connections:    Talks on phone: Not on file    Gets together: Not on file    Attends religious service: Not on file    Active member of club or organization: Not on file    Attends meetings of clubs or organizations: Not on file    Relationship status: Not on file  . Intimate partner violence:    Fear of current or ex partner: Not on file    Emotionally abused: Not on file    Physically abused: Not on file     Forced sexual activity: Not on file  Other Topics Concern  . Not on file  Social History Narrative   Divorced twice---first wife died   2 children---daughter local, son in [place]      Has living will   [place] is daughter   Would accept resuscitation   No prolonged tube feeds if cognitively unaware     Constitutional: Denies fever, malaise, fatigue, headache or abrupt weight changes.  Gastrointestinal: Denies abdominal pain, bloating, constipation, diarrhea or blood in the stool.  GU: Pt reports dysuria.  Denies urgency, frequency, burning sensation, blood in urine, odor or discharge. Skin: Pt reports redness, swelling to foreskin. Denies rashes, lesions or ulcercations.    No other specific complaints in a complete review of systems (except as listed in HPI above).  Objective:   Physical Exam   BP 134/72   Pulse (!) 53   Temp 98.2 F (36.8 C) (Oral)   Wt 181 lb (82.1 kg)   SpO2 98%   BMI 25.97 kg/m  Wt Readings from Last 3 Encounters:  08/22/18 181 lb (82.1 kg)  08/06/18 187 lb 3.2 oz (84.9 kg)  07/19/18 179 lb (81.2 kg)    General: Appears his stated age, well developed, well nourished in NAD. Skin: Multiple abrasions noted to inferior foreskin. Foreskin red, swollen and tender to touch. Abdomen: Soft and nontender. Normal bowel sounds. No distention or masses noted. No CVA tenderness noted.   BMET    Component Value Date/Time   NA 144 02/28/2018 1103   K 3.3 (L) 02/28/2018 1103   CL 111 02/28/2018 1103   CO2 26 02/28/2018 1103   GLUCOSE 167 (H) 02/28/2018 1103   BUN 14 02/28/2018 1103   CREATININE 1.01 02/28/2018 1103   CALCIUM 9.4 02/28/2018 1103   GFRNONAA >60 02/28/2018 1103   GFRAA >60 02/28/2018 1103    Lipid Panel     Component Value Date/Time   CHOL 162 07/27/2017 0519   TRIG 95 07/27/2017 0519   HDL 41 07/27/2017 0519   CHOLHDL 4.0 07/27/2017 0519   VLDL 19 07/27/2017 0519   LDLCALC 102 (H) 07/27/2017 0519    CBC      Component Value Date/Time   WBC 5.2 02/28/2018 1103   RBC 4.76 02/28/2018 1103   HGB 14.5 02/28/2018 1103   HCT 42.3 02/28/2018 1103   PLT 157 02/28/2018 1103   MCV 88.9 02/28/2018 1103  MCH 30.5 02/28/2018 1103   MCHC 34.3 02/28/2018 1103   RDW 11.9 02/28/2018 1103   LYMPHSABS 2.0 07/28/2017 0337   MONOABS 1.0 07/28/2017 0337   EOSABS 0.2 07/28/2017 0337   BASOSABS 0.0 07/28/2017 0337    Hgb A1C Lab Results  Component Value Date   HGBA1C 5.3 07/27/2017           Assessment & Plan:   Cellulitis of Penis, Dysuria:  Urinalysis: 1+ leuks, 2+ blood Will send urine culture RX for Keflex 250 mg TID x 7 days Push fluids  Return precautions discussed Webb Silversmith, NP

## 2018-08-23 ENCOUNTER — Ambulatory Visit (INDEPENDENT_AMBULATORY_CARE_PROVIDER_SITE_OTHER): Payer: PPO

## 2018-08-23 ENCOUNTER — Other Ambulatory Visit: Payer: Self-pay | Admitting: Internal Medicine

## 2018-08-23 ENCOUNTER — Telehealth: Payer: Self-pay | Admitting: Internal Medicine

## 2018-08-23 DIAGNOSIS — I639 Cerebral infarction, unspecified: Secondary | ICD-10-CM | POA: Diagnosis not present

## 2018-08-23 LAB — URINE CULTURE
MICRO NUMBER:: 91452314
SPECIMEN QUALITY:: ADEQUATE

## 2018-08-23 MED ORDER — AMLODIPINE BESYLATE 10 MG PO TABS: 10.0000 mg | ORAL_TABLET | Freq: Every day | ORAL | 3 refills | Status: DC

## 2018-08-23 MED ORDER — ALPRAZOLAM 0.5 MG PO TABS: 0.5000 mg | ORAL_TABLET | Freq: Three times a day (TID) | ORAL | 0 refills | Status: DC

## 2018-08-23 MED ORDER — ALPRAZOLAM 1 MG PO TABS: 0.5000 mg | ORAL_TABLET | Freq: Three times a day (TID) | ORAL | 0 refills | Status: DC | PRN

## 2018-08-23 NOTE — Telephone Encounter (Signed)
Alprazolam will need to be changed to 1mg  1/2 tab 3 times a day die to the 0.5mg  on back order

## 2018-08-23 NOTE — Progress Notes (Signed)
Carelink Summary Report / Loop Recorder 

## 2018-08-23 NOTE — Telephone Encounter (Signed)
Amlodipine rx sent to pharmacy  Alprazolam last filled 07-07-18 #90 Last OV 06-20-18 No Future OV [facility]

## 2018-08-23 NOTE — Telephone Encounter (Signed)
[place] at [facility] called to ck status on the alprazolam. Advised note had been sent to provider. [place] voiced understanding.

## 2018-08-23 NOTE — Telephone Encounter (Signed)
Pt need refill for  Amlodipine 10 mg Alprazolam 0.5 mg    [facility]

## 2018-08-24 DIAGNOSIS — F19230 Other psychoactive substance dependence with withdrawal, uncomplicated: Secondary | ICD-10-CM | POA: Diagnosis not present

## 2018-08-31 ENCOUNTER — Telehealth: Payer: Self-pay

## 2018-08-31 ENCOUNTER — Other Ambulatory Visit: Payer: Self-pay | Admitting: Internal Medicine

## 2018-08-31 NOTE — Telephone Encounter (Signed)
Pt walked in asking to be seen because he is having some bleeding with urination but no dysuria. He had seen Webb Silversmith earlier this week and his UA was negative. She said he would need to be seen. I spoke to him and we made him an appt at [facility] tomorrow.

## 2018-09-01 ENCOUNTER — Encounter: Payer: Self-pay | Admitting: Physician Assistant

## 2018-09-01 ENCOUNTER — Other Ambulatory Visit (HOSPITAL_COMMUNITY)
Admission: RE | Admit: 2018-09-01 | Discharge: 2018-09-01 | Disposition: A | Discharge status: Home / Self Care | Payer: PPO | Source: Other Acute Inpatient Hospital | Attending: Physician Assistant | Admitting: Physician Assistant

## 2018-09-01 ENCOUNTER — Ambulatory Visit (INDEPENDENT_AMBULATORY_CARE_PROVIDER_SITE_OTHER): Payer: PPO | Admitting: Physician Assistant

## 2018-09-01 VITALS — BP 138/82 | HR 44 | Ht 70.0 in | Wt 187.5 lb

## 2018-09-01 DIAGNOSIS — N3090 Cystitis, unspecified without hematuria: Secondary | ICD-10-CM | POA: Diagnosis not present

## 2018-09-01 DIAGNOSIS — R319 Hematuria, unspecified: Secondary | ICD-10-CM | POA: Diagnosis not present

## 2018-09-01 LAB — POCT URINALYSIS DIPSTICK
Glucose, UA: NEGATIVE
NITRITE UA: POSITIVE
PH UA: 5 (ref 5.0–8.0)
PROTEIN UA: POSITIVE — AB
SPEC GRAV UA: 1.025 (ref 1.010–1.025)
UROBILINOGEN UA: 1 U/dL

## 2018-09-01 MED ORDER — SULFAMETHOXAZOLE-TRIMETHOPRIM 800-160 MG PO TABS: 1.0000 | ORAL_TABLET | Freq: Two times a day (BID) | ORAL | 0 refills | Status: DC

## 2018-09-01 NOTE — Patient Instructions (Signed)
Your symptoms are consistent with a bladder infection, also called acute cystitis. Please take your antibiotic (BActrim) as directed until all pills are gone.  Stay very well hydrated.  Consider a daily probiotic (Align, Culturelle, or Activia) to help prevent stomach upset caused by the antibiotic.  Taking a probiotic daily may also help prevent recurrent UTIs.  Also consider taking AZO (Phenazopyridine) tablets to help decrease pain with urination.  I will call you with your urine testing results.  We will change antibiotics if indicated.  Call or return to clinic if symptoms are not resolved by completion of antibiotic.   Schedule a follow-up with your primary care provider in 1 week for reassessment.   Urinary Tract Infection A urinary tract infection (UTI) can occur any place along the urinary tract. The tract includes the kidneys, ureters, bladder, and urethra. A type of germ called bacteria often causes a UTI. UTIs are often helped with antibiotic medicine.  HOME CARE   If given, take antibiotics as told by your doctor. Finish them even if you start to feel better.  Drink enough fluids to keep your pee (urine) clear or pale yellow.  Avoid tea, drinks with caffeine, and bubbly (carbonated) drinks.  Pee often. Avoid holding your pee in for a long time.  Pee before and after having sex (intercourse).  Wipe from front to back after you poop (bowel movement) if you are a woman. Use each tissue only once. GET HELP RIGHT AWAY IF:   You have back pain.  You have lower belly (abdominal) pain.  You have chills.  You feel sick to your stomach (nauseous).  You throw up (vomit).  Your burning or discomfort with peeing does not go away.  You have a fever.  Your symptoms are not better in 3 days. MAKE SURE YOU:   Understand these instructions.  Will watch your condition.  Will get help right away if you are not doing well or get worse. Document Released: 02/22/2008 Document  Revised: 05/30/2012 Document Reviewed: 04/05/2012 [facility] Patient Information 2015 [place], [place]. This information is not intended to replace advice given to you by your health care provider. Make sure you discuss any questions you have with your health care provider.

## 2018-09-01 NOTE — Progress Notes (Signed)
Patient presents to clinic today c/o hematuria x 4 days. Notes about 2 weeks ago getting some of his foreskin stuck in the zipper causing pain and injury. Was seen by PCP at that time and noted to have mil cellulitis of foreskin. Was placed on Keflex. Urine culture at that time was negative. Notes recurrence of hematuria x 4 days with urgency and frequency. Mild dysuria. Denies fever/chills.   Past Medical History:  Diagnosis Date  . ALLERGIC RHINITIS 10/05/2007  . ANXIETY 10/05/2007  . Atrial fibrillation ([place])   . BENIGN PROSTATIC HYPERTROPHY 04/08/2009  . Benzodiazepine dependence ([place])   . COLONIC POLYPS, HX OF 10/05/2007  . DEPRESSION 10/05/2007  . GERD 10/05/2007  . Heart murmur   . HYPERTENSION 10/05/2007  . MDD (major depressive disorder), recurrent, in partial remission ([place])   . NEPHROLITHIASIS, HX OF 10/05/2007  . SLEEP APNEA, OBSTRUCTIVE 10/02/2008  . Stroke [facility])     Current Outpatient Medications on File Prior to Visit  Medication Sig Dispense Refill  . ALPRAZolam (XANAX) 0.5 MG tablet Take 1 tablet (0.5 mg total) by mouth 3 (three) times daily. 90 tablet 0  . ALPRAZolam (XANAX) 1 MG tablet Take 0.5 tablets (0.5 mg total) by mouth 3 (three) times daily as needed for anxiety. 45 tablet 0  . amLODipine (NORVASC) 10 MG tablet Take 1 tablet (10 mg total) by mouth daily. 90 tablet 3  . atorvastatin (LIPITOR) 80 MG tablet Take 80 mg by mouth daily at 6 PM.    . buPROPion (WELLBUTRIN SR) 150 MG 12 hr tablet Take 150 mg by mouth 2 (two) times daily.    . carvedilol (COREG) 6.25 MG tablet Take 1 tablet (6.25 mg total) by mouth 2 (two) times daily with a meal. 180 tablet 2  . cephALEXin (KEFLEX) 250 MG capsule Take 1 capsule (250 mg total) by mouth 4 (four) times daily. 14 capsule 0  . diclofenac sodium (VOLTAREN) 1 % GEL Apply 4 g topically 4 (four) times daily. 1 Tube 1  . finasteride (PROSCAR) 5 MG tablet TAKE 1 TABLET (5 MG TOTAL) BY MOUTH DAILY. 90 tablet 1  . fluorometholone  (FML) 0.1 % ophthalmic suspension Place 1 drop 3 (three) times daily into both eyes.  0  . losartan (COZAAR) 50 MG tablet TAKE 1 TABLET BY MOUTH EVERY DAY 90 tablet 0  . omeprazole (PRILOSEC) 10 MG capsule TAKE ONE CAPSULE BY MOUTH EVERY DAY 90 capsule 3  . RESTASIS 0.05 % ophthalmic emulsion Place 1 drop 2 (two) times daily into both eyes.  4  . rivaroxaban (XARELTO) 20 MG TABS tablet Take 1 tablet (20 mg total) by mouth daily with supper. 90 tablet 3  . timolol (TIMOPTIC) 0.5 % ophthalmic solution Place 1 drop daily into both eyes.  0  . traZODone (DESYREL) 100 MG tablet Take 100 mg at bedtime by mouth.      No current facility-administered medications on file prior to visit.     No Known Allergies  Family History  Problem Relation Age of Onset  . Heart disease Mother   . Heart disease Father   . Colon cancer Maternal Aunt 70    Social History   Socioeconomic History  . Marital status: Widowed    Spouse name: Not on file  . Number of children: 2  . Years of education: Not on file  . Highest education level: Not on file  Occupational History  . Occupation: Nurse, learning disability    Comment: retired  Social Needs  . Financial resource strain: Not on file  . Food insecurity:    Worry: Not on file    Inability: Not on file  . Transportation needs:    Medical: Not on file    Non-medical: Not on file  Tobacco Use  . Smoking status: Never Smoker  . Smokeless tobacco: Never Used  Substance and Sexual Activity  . Alcohol use: No  . Drug use: No  . Sexual activity: Not on file  Lifestyle  . Physical activity:    Days per week: Not on file    Minutes per session: Not on file  . Stress: Not on file  Relationships  . Social connections:    Talks on phone: Not on file    Gets together: Not on file    Attends religious service: Not on file    Active member of club or organization: Not on file    Attends meetings of clubs or organizations: Not on file    Relationship status: Not  on file  Other Topics Concern  . Not on file  Social History Narrative   Divorced twice---first wife died   2 children---daughter local, son in [place]      Has living will   [place] is daughter   Would accept resuscitation   No prolonged tube feeds if cognitively unaware    Review of Systems - See HPI.  All other ROS are negative.  BP 138/82 (BP Location: Left Arm, Patient Position: Sitting, Cuff Size: Normal)   Pulse (!) 44   Ht 5\' 10"  (1.778 m)   Wt 187 lb 8 oz (85 kg)   SpO2 97%   BMI 26.90 kg/m   Physical Exam Vitals signs reviewed.  Constitutional:      Appearance: Normal appearance.  HENT:     Head: Normocephalic and atraumatic.  Cardiovascular:     Rate and Rhythm: Normal rate and regular rhythm.  Pulmonary:     Effort: Pulmonary effort is normal.     Breath sounds: Normal breath sounds.  Abdominal:     Tenderness: There is no abdominal tenderness. There is no right CVA tenderness or left CVA tenderness.  Neurological:     Mental Status: He is alert.  Psychiatric:        Mood and Affect: Mood normal.    Recent Results (from the past 2160 hour(s))  CUP PACEART REMOTE DEVICE CHECK     Status: None   Collection Time: 06/18/18  7:39 AM  Result Value Ref Range   Date Time Interrogation Session [id number]    Pulse Generator Manufacturer [employer]    Pulse Gen Model HYW73 Reveal LINQ    Pulse Gen Serial Number [id number]    Clinic Name [facility]    Implantable Pulse Generator Type ICM/ILR    Implantable Pulse Generator Implant Date [id number]    Eval Rhythm SR with PVCs   CUP PACEART INCLINIC DEVICE CHECK     Status: None   Collection Time: 06/19/18  6:47 PM  Result Value Ref Range   Date Time Interrogation Session [id number]    Pulse Generator Manufacturer [facility]    Pulse Gen Model [id number] Reveal LINQ    Pulse Gen Serial Number [id number]    Clinic Name [facility]    Implantable Pulse Generator Type ICM/ILR    Implantable  Pulse Generator Implant Date [id number]    Battery Status OK    Eval Rhythm ? AF, PVCs   POCT Urinalysis Dipstick (  Automated)     Status: Abnormal   Collection Time: 08/22/18  5:01 PM  Result Value Ref Range   Color, UA yellow    Clarity, UA clear    Glucose, UA Negative Negative   Bilirubin, UA neg    Ketones, UA neg    Spec Grav, UA 1.025 1.010 - 1.025   Blood, UA moderate    pH, UA 6.0 5.0 - 8.0   Protein, UA Positive (A) Negative   Urobilinogen, UA 0.2 0.2 or 1.0 E.U./dL   Nitrite, UA neg    Leukocytes, UA Small (1+) (A) Negative  Urine Culture     Status: None   Collection Time: 08/22/18  5:05 PM  Result Value Ref Range   MICRO NUMBER: [id number]    SPECIMEN QUALITY: Adequate    Sample Source URINE    STATUS: FINAL    ISOLATE 1:      Single organism less than 10,000 CFU/mL isolated. These organisms, commonly found on external and internal genitalia, are considered colonizers. No further testing performed.    Assessment/Plan: 1. Cystitis Urine dip + nitrites, LE and blood. Will send for culture. No residual cellulitis of penis noted. Small wound has healed nicely. Start Bactrim BID x 7 days. Supportive measures and OTC medications reviewed. He is to schedule follow-up with myself of PCP in 1 week.  - POCT urinalysis dipstick - Urine Culture   Leeanne Rio, PA-C

## 2018-09-02 LAB — URINE CULTURE: Culture: NO GROWTH

## 2018-09-10 ENCOUNTER — Other Ambulatory Visit: Payer: Self-pay | Admitting: Internal Medicine

## 2018-09-13 ENCOUNTER — Other Ambulatory Visit: Payer: Self-pay | Admitting: Internal Medicine

## 2018-09-13 NOTE — Telephone Encounter (Signed)
Pt called the office asking why his prescription was denied. I advised him that his refill was too soon because he was prescribed the 1mg  tablet in place of the 0.5mg  tablet because of a backorder on it and he was only supposed to be taking a 1/2 tab. He argued that he never takes the 0.5 mg, that he has been on the 1mg . I tried to explain that he had a 0.5mg  rx in October but he hung up.  [facility] called [facility] and spoke to [place]. She checked and said pt got a 0.5mg  rx in October. She said he was counseled when he picked up the 1mg  rx that he was to take half a tab.   I called the pt back and left a message to call the office so I can try to clear this up for him further.

## 2018-09-15 LAB — CUP PACEART REMOTE DEVICE CHECK
Date Time Interrogation Session: 20191102073615
Implantable Pulse Generator Implant Date: 20181112

## 2018-09-17 ENCOUNTER — Telehealth: Payer: Self-pay | Admitting: Internal Medicine

## 2018-09-17 ENCOUNTER — Telehealth: Payer: Self-pay

## 2018-09-17 ENCOUNTER — Other Ambulatory Visit: Payer: Self-pay | Admitting: Internal Medicine

## 2018-09-17 MED ORDER — VALSARTAN 80 MG PO TABS: 80.0000 mg | ORAL_TABLET | Freq: Every day | ORAL | 3 refills | Status: DC

## 2018-09-17 MED ORDER — DUTASTERIDE 0.5 MG PO CAPS: 0.5000 mg | ORAL_CAPSULE | Freq: Every day | ORAL | 3 refills | Status: DC

## 2018-09-17 NOTE — Telephone Encounter (Signed)
Patient's daughter Eustaquio Maize on [place]) called and expressing concern regarding patient's health. She stated that patient took all his 30 supply of Xanax in 2 weeks. Patient also just loss his girlfriend about 2 weeks ago. Daughter feels patient is not taking all the his medication and not taking them correctly. Patient's daughter would like some advise from Dr Silvio Pate. Please advise.

## 2018-09-17 NOTE — Telephone Encounter (Signed)
Only option for the finasteride is dutasteride 0.5mg  daily (okay to send 1 year) I would recommend change to valsartan 80mg  daily instead of the losartan (1 year okay)

## 2018-09-17 NOTE — Telephone Encounter (Signed)
[facility] that finasteride and losartan are on national back order and pt request different meds as substitutions be sent to [facility]. Pt request cb when done. Pt is presently out of medication. Pt established care 06/20/18. Pt also request refill on xanax. Advised too early to request refill for xanax. (please seen phonenote 09/17/18 from [place]).

## 2018-09-17 NOTE — Telephone Encounter (Signed)
I would recommend a visit to discuss what is going on He is early for the xanax, so we should wait on it Might want to consider alternatives See if he can come in at the end of the week or Monday

## 2018-09-17 NOTE — Telephone Encounter (Signed)
Spoke to pt's daughter per DPR. We made him an appt for 09-20-18 at 2pm.

## 2018-09-17 NOTE — Telephone Encounter (Signed)
I spoke to pt's daughter per DPR. Made appt 09-20-18.

## 2018-09-17 NOTE — Addendum Note (Signed)
Addended by: Pilar Grammes on: 09/17/2018 05:25 PM   Modules accepted: Orders

## 2018-09-17 NOTE — Telephone Encounter (Signed)
New rxs sent in. 

## 2018-09-20 ENCOUNTER — Ambulatory Visit (INDEPENDENT_AMBULATORY_CARE_PROVIDER_SITE_OTHER): Payer: PPO | Admitting: Internal Medicine

## 2018-09-20 ENCOUNTER — Encounter: Payer: Self-pay | Admitting: Internal Medicine

## 2018-09-20 VITALS — BP 102/64 | HR 63 | Temp 98.0°F | Ht 70.0 in | Wt 178.0 lb

## 2018-09-20 DIAGNOSIS — F3341 Major depressive disorder, recurrent, in partial remission: Secondary | ICD-10-CM

## 2018-09-20 MED ORDER — ALPRAZOLAM 1 MG PO TABS: 0.5000 mg | ORAL_TABLET | Freq: Three times a day (TID) | ORAL | 0 refills | Status: DC | PRN

## 2018-09-20 MED ORDER — MIRTAZAPINE 15 MG PO TABS: 15.0000 mg | ORAL_TABLET | Freq: Every day | ORAL | 3 refills | Status: DC

## 2018-09-20 NOTE — Assessment & Plan Note (Addendum)
Worse now since the recent death of his girlfriend Big anxiety component Will restart the xanax---daughter will get pill box and be sure he is taking it right Off trazodone---will try the mirtazapine Get back to regular doses of the bupropion Referral for psychology

## 2018-09-20 NOTE — Progress Notes (Signed)
Subjective:    Patient ID: Jared [name], male    DOB: 1941/05/10, 78 y.o.   MRN: [id number]  HPI Here with daughter, Eustaquio Maize due to concerns about his medications and how he is taking them Recently lost good friend---was in relationship (she died about 2 weeks ago)  He is having a very hard time about this Ran out of his xanax---may have overtaken this (ran out 1.5 weeks) This may have been due to having to fill with the 1mg  tabs instead  Hasn't been taking the trazodone He did find this only sporadically effective Daughter wonders about mirtazapine Depressed, poor appetite and has lost weight  Thinks he is taking the bupropion regularly but daughter notes pill count shows he is not Daughter plans to organize his meds  Depression is much more severe Also has anxiety Daily symptoms No suicidal ideation now No other social connections----has church but not going consistently  Current Outpatient Medications on File Prior to Visit  Medication Sig Dispense Refill  . ALPRAZolam (XANAX) 1 MG tablet Take 0.5 tablets (0.5 mg total) by mouth 3 (three) times daily as needed for anxiety. 45 tablet 0  . amLODipine (NORVASC) 10 MG tablet Take 1 tablet (10 mg total) by mouth daily. 90 tablet 3  . aspirin EC 81 MG tablet Take 81 mg by mouth daily.    Marland Kitchen atorvastatin (LIPITOR) 80 MG tablet Take 80 mg by mouth daily at 6 PM.    . buPROPion (WELLBUTRIN SR) 150 MG 12 hr tablet Take 150 mg by mouth 2 (two) times daily.    . carvedilol (COREG) 6.25 MG tablet Take 1 tablet (6.25 mg total) by mouth 2 (two) times daily with a meal. 180 tablet 2  . diclofenac sodium (VOLTAREN) 1 % GEL Apply 4 g topically 4 (four) times daily. 1 Tube 1  . dutasteride (AVODART) 0.5 MG capsule Take 1 capsule (0.5 mg total) by mouth daily. 90 capsule 3  . fluorometholone (FML) 0.1 % ophthalmic suspension Place 1 drop 3 (three) times daily into both eyes.  0  . omeprazole (PRILOSEC) 10 MG capsule TAKE ONE CAPSULE BY MOUTH EVERY  DAY 90 capsule 3  . RESTASIS 0.05 % ophthalmic emulsion Place 1 drop 2 (two) times daily into both eyes.  4  . rivaroxaban (XARELTO) 20 MG TABS tablet Take 1 tablet (20 mg total) by mouth daily with supper. 90 tablet 3  . timolol (TIMOPTIC) 0.5 % ophthalmic solution Place 1 drop daily into both eyes.  0  . valsartan (DIOVAN) 80 MG tablet Take 1 tablet (80 mg total) by mouth daily. 90 tablet 3   No current facility-administered medications on file prior to visit.     Allergies  Allergen Reactions  . Codeine     Past Medical History:  Diagnosis Date  . ALLERGIC RHINITIS 10/05/2007  . ANXIETY 10/05/2007  . Atrial fibrillation ([place])   . BENIGN PROSTATIC HYPERTROPHY 04/08/2009  . Benzodiazepine dependence ([place])   . COLONIC POLYPS, HX OF 10/05/2007  . DEPRESSION 10/05/2007  . GERD 10/05/2007  . Heart murmur   . HYPERTENSION 10/05/2007  . MDD (major depressive disorder), recurrent, in partial remission ([place])   . NEPHROLITHIASIS, HX OF 10/05/2007  . SLEEP APNEA, OBSTRUCTIVE 10/02/2008  . Stroke [facility])     Past Surgical History:  Procedure Laterality Date  . APPENDECTOMY    . CATARACT EXTRACTION  2014   bilateral  . CHOLECYSTECTOMY    . FOOT SURGERY     left  .  HERNIA REPAIR  2014  . LOOP RECORDER INSERTION N/A 07/31/2017   Procedure: LOOP RECORDER INSERTION;  Surgeon: Evans Lance, MD;  Location: [facility];  Service: Cardiovascular;  Laterality: N/A;  . TEE WITHOUT CARDIOVERSION N/A 07/31/2017   Procedure: TRANSESOPHAGEAL ECHOCARDIOGRAM (TEE) WITH LOOP;  Surgeon: Dorothy Spark, MD;  Location: [facility] ENDOSCOPY;  Service: Cardiovascular;  Laterality: N/A;  . WRIST FRACTURE SURGERY     right x2   left x1    Family History  Problem Relation Age of Onset  . Heart disease Mother   . Heart disease Father   . Colon cancer Maternal Aunt 70    Social History   Socioeconomic History  . Marital status: Widowed    Spouse name: Not on file  . Number of children: 2  . Years  of education: Not on file  . Highest education level: Not on file  Occupational History  . 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 Financial resource strain: Not on file  . Food insecurity:    Worry: Not on file    Inability: Not on file  . Transportation needs:    Medical: Not on file    Non-medical: Not on file  Tobacco Use  . Smoking status: Never Smoker  . Smokeless tobacco: Never Used  Substance and Sexual Activity  . Alcohol use: No  . Drug use: No  . Sexual activity: Not on file  Lifestyle  . Physical activity:    Days per week: Not on file    Minutes per session: Not on file  . Stress: Not on file  Relationships  . Social connections:    Talks on phone: Not on file    Gets together: Not on file    Attends religious service: Not on file    Active member of club or organization: Not on file    Attends meetings of clubs or organizations: Not on file    Relationship status: Not on file  . Intimate partner violence:    Fear of current or ex partner: Not on file    Emotionally abused: Not on file    Physically abused: Not on file    Forced sexual activity: Not on file  Other Topics Concern  . Not on file  Social History Narrative   Divorced twice---first wife died   2 children---daughter local, son in [place]      Has living will   [place] is daughter   Would accept resuscitation   No prolonged tube feeds if cognitively unaware   Review of Systems Weight down 8# in past 3 months [facility], now "I don't enjoy going" Did get the CPAP---had a hard time adapting to it and not using it now Has noticed some muscle cramping--that he associates as being what he had with the stroke (but may be benzo withdrawal since that happened before) Tinnitus is chronic and more noticeable lately. Hearing is not great--but not markedly different    Objective:   Physical Exam  Constitutional: He appears well-developed. No  distress.  Psychiatric:  Mildly depressed but appropriate affect Normal conversation, reasonable insight No suicidal ideation           Assessment & Plan:

## 2018-09-22 ENCOUNTER — Other Ambulatory Visit: Payer: Self-pay | Admitting: Internal Medicine

## 2018-09-25 ENCOUNTER — Ambulatory Visit (INDEPENDENT_AMBULATORY_CARE_PROVIDER_SITE_OTHER): Payer: PPO

## 2018-09-25 DIAGNOSIS — I639 Cerebral infarction, unspecified: Secondary | ICD-10-CM

## 2018-09-26 LAB — CUP PACEART REMOTE DEVICE CHECK
Date Time Interrogation Session: 20200107134112
Implantable Pulse Generator Implant Date: 20181112

## 2018-09-26 NOTE — Progress Notes (Signed)
Carelink Summary Report / Loop Recorder 

## 2018-10-04 ENCOUNTER — Ambulatory Visit: Payer: PPO | Admitting: Cardiovascular Disease

## 2018-10-09 LAB — CUP PACEART REMOTE DEVICE CHECK
Date Time Interrogation Session: 20191205131101
Implantable Pulse Generator Implant Date: 20181112

## 2018-10-12 ENCOUNTER — Other Ambulatory Visit: Payer: Self-pay | Admitting: Internal Medicine

## 2018-10-16 ENCOUNTER — Ambulatory Visit: Payer: PPO | Admitting: Psychology

## 2018-10-21 ENCOUNTER — Telehealth: Payer: Self-pay

## 2018-10-22 ENCOUNTER — Other Ambulatory Visit: Payer: Self-pay | Admitting: Internal Medicine

## 2018-10-22 MED ORDER — BUPROPION HCL ER (SR) 150 MG PO TB12: 150.0000 mg | ORAL_TABLET | Freq: Two times a day (BID) | ORAL | 11 refills | Status: DC

## 2018-10-22 MED ORDER — ALPRAZOLAM 1 MG PO TABS: 0.5000 mg | ORAL_TABLET | Freq: Three times a day (TID) | ORAL | 0 refills | Status: DC | PRN

## 2018-10-22 NOTE — Telephone Encounter (Signed)
Called patient to discuss his symptoms as I see he did not go for eval per triage recommendations.  He denies any further episodes of blood in his urine since that 1 event yesterday morning.  In addition, he has had no further chills or sleeping difficulties.  He knows to call back if he experiences any further symptoms. Patient states he is doing fine now.   FYI to Dr. Silvio Pate.

## 2018-10-22 NOTE — Telephone Encounter (Signed)
Spoke to pt to clarify whether he was really cutting the pills in 1/3 because he would have medication left over if that was the case. He said he misspoke and is taking 1/2 3 times a day and ran out yesterday.   Last filled 09-20-18 #45 Last OV 09-20-18 Next OV 12-27-18 [facility]  Refill request sent to [facility] in Dr Alla German absence

## 2018-10-22 NOTE — Telephone Encounter (Signed)
Pt need refills for   Bupropion 150 mg  Alprazolam pt want to know if this can be changed to .75mg  because pill is to small to cut into 3  atorvastatin 80 mg    [facility]

## 2018-10-22 NOTE — Telephone Encounter (Signed)
Patient Name: Jared [name] Gender: Male DOB: 05/18/1941 Age: 78 Y 11 M 26 D Return Phone Number: [phone] (Primary), [phone] (Secondary) Address: City/State/Zip: [place] [address] Client [facility] Night - Client Client Site [facility] [place] Physician Viviana Simpler - MD Contact Type Call Who Is Calling Patient / Member / Family / Caregiver Call Type Triage / Clinical Relationship To Patient Self Return Phone Number [phone] (Primary) Chief Complaint Urinary Retention (> THREE MONTHS) Reason for Call Symptomatic / Request for [place] states he needs appt . His urine is turning red and not much output. Also feels warm but no thermometer. Translation No Nurse Assessment Nurse: Rosemarie Beath, RN, Leah Date/Time (Eastern Time): 10/21/2018 8:52:53 AM Confirm and document reason for call. If symptomatic, describe symptoms. ---Caller states: His urine is turning red and not much output. Symptoms have started over the last day or 2. He had a stroke about a year ago. He has had some medication changes. He is having difficulty with sleeping. He was having slight chills. Also feels warm but no thermometer. He is also taking xarelto. Does the patient have any new or worsening symptoms? ---Yes Will a triage be completed? ---Yes Related visit to physician within the last 2 weeks? ---Yes Does the PT have any chronic conditions? (i.e. diabetes, asthma, this includes High risk factors for pregnancy, etc.) ---Yes List chronic conditions. ---CVA, Is this a behavioral health or substance abuse call? ---No Guidelines Guideline Title Affirmed Question Affirmed Notes Nurse Date/Time (Eastern Time) Urine - Blood In Taking Coumadin (warfarin) or other strong blood thinner, or known bleeding disorder (e.g., thrombocytopenia) Leming, RN, Leah 10/21/2018 8:55:27 AM Disp. Time Eilene Ghazi Time) Disposition Final  User 10/21/2018 8:56:51 AM See HCP within 4 Hours (or PCP triage) Yes Leming, RN, Leah PLEASE NOTE: All timestamps contained within this report are represented as [place] Standard Time. CONFIDENTIALTY NOTICE: This fax transmission is intended only for the addressee. It contains information that is legally privileged, confidential or otherwise protected from use or disclosure. If you are not the intended recipient, you are strictly prohibited from reviewing, disclosing, copying using or disseminating any of this information or taking any action in reliance on or regarding this information. If you have received this fax in error, please notify us immediately by telephone so that we can arrange for its return to [place]. Phone: [phone], Toll-Free: [phone], Fax: [phone] Page: 2 of 2 Call Id: [id number] [place] Disagree/Comply Comply Caller Understands Yes PreDisposition Did not know what to do Care Advice Given Per Guideline SEE HCP WITHIN 4 HOURS (OR PCP TRIAGE): * IF OFFICE WILL BE CLOSED AND NO PCP (PRIMARY CARE PROVIDER) SECOND-LEVEL TRIAGE: You need to be seen within the next 3 or 4 hours. A nearby [facility]) is often a good source of care. Another choice is to go to the ED. Go sooner if you become worse. BRING MEDICINES: CALL BACK IF: * You become worse. CARE ADVICE given per Urine, Blood In (Adult) guideline. Referrals [facility] at [place]

## 2018-10-23 NOTE — Telephone Encounter (Signed)
He should be checked regardless to confirm there is no blood in his urine (which would require further testing)

## 2018-10-23 NOTE — Telephone Encounter (Signed)
Spoke to pt. Made appt 10-25-27 at 4pm

## 2018-10-25 ENCOUNTER — Ambulatory Visit: Payer: PPO | Admitting: Internal Medicine

## 2018-10-25 ENCOUNTER — Ambulatory Visit: Payer: PPO | Admitting: Psychology

## 2018-10-25 DIAGNOSIS — F320 Major depressive disorder, single episode, mild: Secondary | ICD-10-CM

## 2018-10-26 ENCOUNTER — Encounter: Payer: Self-pay | Admitting: Family Medicine

## 2018-10-26 ENCOUNTER — Ambulatory Visit (INDEPENDENT_AMBULATORY_CARE_PROVIDER_SITE_OTHER): Payer: PPO | Admitting: Family Medicine

## 2018-10-26 VITALS — BP 142/72 | HR 65 | Temp 97.5°F | Ht 70.0 in | Wt 178.0 lb

## 2018-10-26 DIAGNOSIS — R319 Hematuria, unspecified: Secondary | ICD-10-CM | POA: Diagnosis not present

## 2018-10-26 NOTE — Progress Notes (Signed)
On xarelto and finasteride at baseline.    Saw blood in urine about 5 days ago.  Single event.  No other sx in the meantime.  H/o renal stones but no recent pain.  No recent passed stone.  No pain with urination.  He has urinary urgency at baseline, for years. No dysuria o/w.  No FCNAVD.  No other bleeding elsewhere.    Meds, vitals, and allergies reviewed.   ROS: Per HPI unless specifically indicated in ROS section   GEN: nad, alert and oriented HEENT: mucous membranes moist NECK: supple w/o LA CV: Irregular rate and rhythm noted.  No tachycardia PULM: ctab, no inc wob ABD: soft, +bs EXT: no edema SKIN: Well-perfused  Unable to provide urine sample.

## 2018-10-26 NOTE — Patient Instructions (Signed)
Clean catch urine sample when possible then bring to the lab.  We'll go from there.  Take care.  Glad to see you.

## 2018-10-28 DIAGNOSIS — R319 Hematuria, unspecified: Secondary | ICD-10-CM | POA: Insufficient documentation

## 2018-10-28 NOTE — Assessment & Plan Note (Signed)
Reportedly had single episode of hematuria.  He is already on finasteride and Xarelto.  We talked about differential diagnosis.  Unable to provide urine sample at the office visit.  I sent him home with sterile container.  He will drop off urine sample when he can collect that.  We can go from there.  If he truly does have painless hematuria then we need to consider options such as urology evaluation and continuing anticoagulation.  Rationale for all of this discussed with patient.  He understood.  I did not stop his anticoagulation yet, as he is having no symptoms currently.  Routed to PCP for input.

## 2018-10-29 ENCOUNTER — Other Ambulatory Visit (INDEPENDENT_AMBULATORY_CARE_PROVIDER_SITE_OTHER): Payer: PPO

## 2018-10-29 ENCOUNTER — Ambulatory Visit (INDEPENDENT_AMBULATORY_CARE_PROVIDER_SITE_OTHER): Payer: PPO

## 2018-10-29 DIAGNOSIS — I639 Cerebral infarction, unspecified: Secondary | ICD-10-CM | POA: Diagnosis not present

## 2018-10-29 DIAGNOSIS — R319 Hematuria, unspecified: Secondary | ICD-10-CM

## 2018-10-29 LAB — CUP PACEART REMOTE DEVICE CHECK
Implantable Pulse Generator Implant Date: 20181112
MDC IDC SESS DTM: 20200209144211

## 2018-10-29 LAB — URINALYSIS, ROUTINE W REFLEX MICROSCOPIC
Bilirubin Urine: NEGATIVE
Ketones, ur: NEGATIVE
Leukocytes, UA: NEGATIVE
Nitrite: NEGATIVE
Specific Gravity, Urine: 1.015 (ref 1.000–1.030)
Total Protein, Urine: NEGATIVE
URINE GLUCOSE: NEGATIVE
Urobilinogen, UA: 0.2 (ref 0.0–1.0)
pH: 7 (ref 5.0–8.0)

## 2018-10-31 DIAGNOSIS — M25562 Pain in left knee: Secondary | ICD-10-CM | POA: Diagnosis not present

## 2018-10-31 DIAGNOSIS — M1711 Unilateral primary osteoarthritis, right knee: Secondary | ICD-10-CM | POA: Diagnosis not present

## 2018-10-31 DIAGNOSIS — M170 Bilateral primary osteoarthritis of knee: Secondary | ICD-10-CM | POA: Diagnosis not present

## 2018-11-01 ENCOUNTER — Other Ambulatory Visit: Payer: Self-pay | Admitting: Family Medicine

## 2018-11-01 DIAGNOSIS — R319 Hematuria, unspecified: Secondary | ICD-10-CM

## 2018-11-08 ENCOUNTER — Ambulatory Visit: Payer: Self-pay | Admitting: Psychology

## 2018-11-08 DIAGNOSIS — M1711 Unilateral primary osteoarthritis, right knee: Secondary | ICD-10-CM | POA: Diagnosis not present

## 2018-11-08 NOTE — Progress Notes (Signed)
Carelink Summary Report / Loop Recorder 

## 2018-11-09 ENCOUNTER — Telehealth: Payer: Self-pay | Admitting: Internal Medicine

## 2018-11-09 NOTE — Telephone Encounter (Signed)
Pt dropped off Preoperative Risk Evaluation to be filled out by Dr. Silvio Pate. Placed in the RX tower in the front office for Dr. Silvio Pate. Please let patient know that form is completed.

## 2018-11-09 NOTE — Telephone Encounter (Signed)
Form placed in Dr Letvak's Inbox on his desk 

## 2018-11-12 NOTE — Telephone Encounter (Signed)
Patient scheduled appointment on 11/14/18.

## 2018-11-12 NOTE — Telephone Encounter (Signed)
He will need appt 

## 2018-11-14 ENCOUNTER — Ambulatory Visit (INDEPENDENT_AMBULATORY_CARE_PROVIDER_SITE_OTHER): Payer: PPO | Admitting: Internal Medicine

## 2018-11-14 ENCOUNTER — Encounter: Payer: Self-pay | Admitting: Internal Medicine

## 2018-11-14 VITALS — BP 122/84 | HR 47 | Temp 97.9°F | Ht 70.0 in | Wt 191.0 lb

## 2018-11-14 DIAGNOSIS — I480 Paroxysmal atrial fibrillation: Secondary | ICD-10-CM | POA: Diagnosis not present

## 2018-11-14 DIAGNOSIS — F3341 Major depressive disorder, recurrent, in partial remission: Secondary | ICD-10-CM | POA: Diagnosis not present

## 2018-11-14 DIAGNOSIS — Z01818 Encounter for other preprocedural examination: Secondary | ICD-10-CM | POA: Insufficient documentation

## 2018-11-14 LAB — RENAL FUNCTION PANEL
Albumin: 4.1 g/dL (ref 3.5–5.2)
BUN: 26 mg/dL — ABNORMAL HIGH (ref 6–23)
CALCIUM: 9.6 mg/dL (ref 8.4–10.5)
CO2: 30 mEq/L (ref 19–32)
Chloride: 105 mEq/L (ref 96–112)
Creatinine, Ser: 1.11 mg/dL (ref 0.40–1.50)
GFR: 64.4 mL/min (ref >60.00)
GLUCOSE: 76 mg/dL (ref 70–99)
Phosphorus: 2.9 mg/dL (ref 2.3–4.6)
Potassium: 3.6 mEq/L (ref 3.5–5.1)
Sodium: 142 mEq/L (ref 135–145)

## 2018-11-14 LAB — HEPATIC FUNCTION PANEL
ALT: 35 U/L (ref 0–53)
AST: 23 U/L (ref 0–37)
Albumin: 4.1 g/dL (ref 3.5–5.2)
Alkaline Phosphatase: 92 U/L (ref 39–117)
Bilirubin, Direct: 0.1 mg/dL (ref 0.0–0.3)
Total Bilirubin: 0.5 mg/dL (ref 0.2–1.2)
Total Protein: 7.4 g/dL (ref 6.0–8.3)

## 2018-11-14 LAB — CBC
HCT: 45.1 % (ref 39.0–52.0)
Hemoglobin: 15.6 g/dL (ref 13.0–17.0)
MCHC: 34.6 g/dL (ref 30.0–36.0)
MCV: 91.3 fl (ref 78.0–100.0)
Platelets: 220 10*3/uL (ref 150.0–400.0)
RBC: 4.94 Mil/uL (ref 4.22–5.81)
RDW: 13.6 % (ref 11.5–15.5)
WBC: 7.6 10*3/uL (ref 4.0–10.5)

## 2018-11-14 NOTE — Progress Notes (Signed)
Subjective:    Patient ID: Jared [name], male    DOB: 1941-05-18, 78 y.o.   MRN: [id number]  HPI Here for preoperative evaluation for right TKR Planned by Dr Gaynell Face he has "waited too long" Is planning rehab after  No chest pain No palpitations Rare dizziness---"like my equilibrium is off" Doing some free weights--hard to walk on knee Chronic intermittent edema---no change  Some cough--has to clear phlegm from throat No SOB  Sleeps in bed Sleeps flat--no PND Nocturia x 0-many  Mood is not great---"dreading this operation"  Reviewed last echo Normal EF  Current Outpatient Medications on File Prior to Visit  Medication Sig Dispense Refill  . ALPRAZolam (XANAX) 1 MG tablet Take 0.5 tablets (0.5 mg total) by mouth 3 (three) times daily as needed for anxiety. 45 tablet 0  . amLODipine (NORVASC) 10 MG tablet Take 1 tablet (10 mg total) by mouth daily. 90 tablet 3  . atorvastatin (LIPITOR) 80 MG tablet Take 80 mg by mouth daily at 6 PM.    . buPROPion (WELLBUTRIN SR) 150 MG 12 hr tablet Take 1 tablet (150 mg total) by mouth 2 (two) times daily. 60 tablet 11  . carvedilol (COREG) 6.25 MG tablet Take 1 tablet (6.25 mg total) by mouth 2 (two) times daily with a meal. 180 tablet 2  . diclofenac sodium (VOLTAREN) 1 % GEL Apply 4 g topically 4 (four) times daily. 1 Tube 1  . dutasteride (AVODART) 0.5 MG capsule Take 1 capsule (0.5 mg total) by mouth daily. 90 capsule 3  . fluorometholone (FML) 0.1 % ophthalmic suspension Place 1 drop 3 (three) times daily into both eyes.  0  . mirtazapine (REMERON) 15 MG tablet Take 1 tablet (15 mg total) by mouth at bedtime. 30 tablet 3  . omeprazole (PRILOSEC) 10 MG capsule TAKE ONE CAPSULE BY MOUTH EVERY DAY 90 capsule 3  . RESTASIS 0.05 % ophthalmic emulsion Place 1 drop 2 (two) times daily into both eyes.  4  . rivaroxaban (XARELTO) 20 MG TABS tablet Take 1 tablet (20 mg total) by mouth daily with supper. 90 tablet 3  . timolol (TIMOPTIC) 0.5  % ophthalmic solution Place 1 drop daily into both eyes.  0  . valsartan (DIOVAN) 80 MG tablet Take 1 tablet (80 mg total) by mouth daily. 90 tablet 3   No current facility-administered medications on file prior to visit.     Allergies  Allergen Reactions  . Codeine     Past Medical History:  Diagnosis Date  . ALLERGIC RHINITIS 10/05/2007  . ANXIETY 10/05/2007  . Atrial fibrillation ([place])   . BENIGN PROSTATIC HYPERTROPHY 04/08/2009  . Benzodiazepine dependence ([place])   . COLONIC POLYPS, HX OF 10/05/2007  . DEPRESSION 10/05/2007  . GERD 10/05/2007  . Heart murmur   . HYPERTENSION 10/05/2007  . MDD (major depressive disorder), recurrent, in partial remission ([place])   . NEPHROLITHIASIS, HX OF 10/05/2007  . SLEEP APNEA, OBSTRUCTIVE 10/02/2008  . Stroke [facility])     Past Surgical History:  Procedure Laterality Date  . APPENDECTOMY    . CATARACT EXTRACTION  2014   bilateral  . CHOLECYSTECTOMY    . FOOT SURGERY     left  . HERNIA REPAIR  2014  . LOOP RECORDER INSERTION N/A 07/31/2017   Procedure: LOOP RECORDER INSERTION;  Surgeon: Evans Lance, MD;  Location: [facility];  Service: Cardiovascular;  Laterality: N/A;  . TEE WITHOUT CARDIOVERSION N/A 07/31/2017   Procedure: TRANSESOPHAGEAL  ECHOCARDIOGRAM (TEE) WITH LOOP;  Surgeon: Dorothy Spark, MD;  Location: [facility] ENDOSCOPY;  Service: Cardiovascular;  Laterality: N/A;  . WRIST FRACTURE SURGERY     right x2   left x1    Family History  Problem Relation Age of Onset  . Heart disease Mother   . Heart disease Father   . Colon cancer Maternal Aunt 70    Social History   Socioeconomic History  . Marital status: Widowed    Spouse name: Not on file  . Number of children: 2  . Years of education: Not on file  . Highest education level: Not on file  Occupational History  . 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 Financial resource strain: Not on file  . Food insecurity:    Worry: Not on file     Inability: Not on file  . Transportation needs:    Medical: Not on file    Non-medical: Not on file  Tobacco Use  . Smoking status: Never Smoker  . Smokeless tobacco: Never Used  Substance and Sexual Activity  . Alcohol use: No  . Drug use: No  . Sexual activity: Not on file  Lifestyle  . Physical activity:    Days per week: Not on file    Minutes per session: Not on file  . Stress: Not on file  Relationships  . Social connections:    Talks on phone: Not on file    Gets together: Not on file    Attends religious service: Not on file    Active member of club or organization: Not on file    Attends meetings of clubs or organizations: Not on file    Relationship status: Not on file  . Intimate partner violence:    Fear of current or ex partner: Not on file    Emotionally abused: Not on file    Physically abused: Not on file    Forced sexual activity: Not on file  Other Topics Concern  . Not on file  Social History Narrative   Divorced twice---first wife died   2 children---daughter local, son in [place]      Has living will   [place] is daughter   Would accept resuscitation   No prolonged tube feeds if cognitively unaware   Review of Systems Tremendous appetite---knows he has gained some weight Sleeps fine Bowels are variable--- may get constipation at times    Objective:   Physical Exam  Constitutional: He appears well-developed. No distress.  Neck: No thyromegaly present.  Cardiovascular: Normal rate, normal heart sounds and intact distal pulses. Exam reveals no gallop.  No murmur heard. irregular  Respiratory: Effort normal and breath sounds normal. No respiratory distress. He has no wheezes. He has no rales.  GI: Soft. There is no abdominal tenderness.  Musculoskeletal:        General: No edema.     Comments: Antalgic gait  Lymphadenopathy:    He has no cervical adenopathy.  Psychiatric: He has a normal mood and affect. His behavior is normal.    No sig depression now           Assessment & Plan:

## 2018-11-14 NOTE — Assessment & Plan Note (Signed)
This is controlled Worst mood issue is the pain and worrying about procedure Not a reason to hold off on procedure

## 2018-11-14 NOTE — Assessment & Plan Note (Addendum)
Appears to be medically stable No obvious contraindications Normal EF on echo No symptoms of respiratory difficulty, CHF, etc Discussed quad strengthening for now to prepare for rehab

## 2018-11-14 NOTE — Assessment & Plan Note (Signed)
Rate is good Will stop the xarelto 2 days before and restart right after procedure

## 2018-11-15 ENCOUNTER — Other Ambulatory Visit: Payer: Self-pay | Admitting: Internal Medicine

## 2018-11-15 ENCOUNTER — Encounter: Payer: Self-pay | Admitting: *Deleted

## 2018-11-15 DIAGNOSIS — R310 Gross hematuria: Secondary | ICD-10-CM | POA: Diagnosis not present

## 2018-11-15 DIAGNOSIS — N401 Enlarged prostate with lower urinary tract symptoms: Secondary | ICD-10-CM | POA: Diagnosis not present

## 2018-11-15 DIAGNOSIS — R3915 Urgency of urination: Secondary | ICD-10-CM | POA: Diagnosis not present

## 2018-11-19 ENCOUNTER — Other Ambulatory Visit: Payer: Self-pay | Admitting: Internal Medicine

## 2018-11-20 NOTE — Telephone Encounter (Signed)
[place] pt... Xanax last filled 09/21/2018 #45... please advise

## 2018-11-22 ENCOUNTER — Telehealth: Payer: Self-pay | Admitting: Cardiology

## 2018-11-22 NOTE — Telephone Encounter (Signed)
Spoke w/ pt and informed him that he doesn't have to send manual transmission every day. Informed him that his home monitor is automatic and it will send b/w 12 AM - 5 AM every day. If we ever need him to send a manual transmission we will call him and request him to do so. Pt verbalized understanding.

## 2018-11-22 NOTE — Telephone Encounter (Signed)
LMOVM for pt to return call. Need to inform pt that he doesn't have to send manual transmission every day and provide education about his home monitor.

## 2018-11-28 NOTE — Patient Instructions (Addendum)
JAYREN CEASE  11/28/2018      Your procedure is scheduled on:  12-07-2018   Report to [facility] Main  Entrance,  Report to admitting at  9:30 AM    Call this number if you have problems the morning of surgery [phone]       PLEASE BRING YOUR CPAP [place]     Remember: Do not eat food or drink liquids :After Midnight.  THIS INCLUDES NO WATER , GUM, CANDY, MINTS  BRUSH YOUR TEETH MORNING OF SURGERY AND RINSE YOUR MOUTH OUT         Take these medicines the morning of surgery with A SIP OF WATER:  Bupropion (wellbutrin),  Carvedilol (coreg),  Amlodipine (norvasc),  Omeprazole (prilosec)                                   You may not have any metal on your body including piercings              Do not wear jewelry, make-up, lotions, powders or perfumes, deodorant                          Men may shave face and neck.       Do not bring valuables to the hospital. [place].  Contacts, dentures or bridgework may not be worn into surgery.  Leave suitcase in the car. After surgery it may be brought to your room.      _____________________________________________________________________             [facility] - Preparing for Surgery Before surgery, you can play an important role.  Because skin is not sterile, your skin needs to be as free of germs as possible.  You can reduce the number of germs on your skin by washing with CHG (chlorahexidine gluconate) soap before surgery.  CHG is an antiseptic cleaner which kills germs and bonds with the skin to continue killing germs even after washing. Please DO NOT use if you have an allergy to CHG or antibacterial soaps.  If your skin becomes reddened/irritated stop using the CHG and inform your nurse when you arrive at Short Stay. Do not shave (including legs and underarms) for at least 48 hours prior to the first CHG  shower.  You may shave your face/neck. Please follow these instructions carefully:  1.  Shower with CHG Soap the night before surgery and the  morning of Surgery.  2.  If you choose to wash your hair, wash your hair first as usual with your  normal  shampoo.  3.  After you shampoo, rinse your hair and body thoroughly to remove the  shampoo.                            4.  Use CHG as you would any other liquid soap.  You can apply chg directly  to the skin and wash                       Gently with a scrungie or clean washcloth.  5.  Apply the CHG Soap to your body ONLY FROM THE NECK DOWN.   Do not use on face/ open                           Wound or open sores. Avoid contact with eyes, ears mouth and genitals (private parts).                       Wash face,  Genitals (private parts) with your normal soap.             6.  Wash thoroughly, paying special attention to the area where your surgery  will be performed.  7.  Thoroughly rinse your body with warm water from the neck down.  8.  DO NOT shower/wash with your normal soap after using and rinsing off  the CHG Soap.             9.  Pat yourself dry with a clean towel.            10.  Wear clean pajamas.            11.  Place clean sheets on your bed the night of your first shower and do not  sleep with pets. Day of Surgery : Do not apply any lotions/deodorants the morning of surgery.  Please wear clean clothes to the hospital/surgery center.  FAILURE TO FOLLOW THESE INSTRUCTIONS MAY RESULT IN THE CANCELLATION OF YOUR SURGERY PATIENT SIGNATURE_________________________________  NURSE SIGNATURE__________________________________  ________________________________________________________________________

## 2018-11-29 ENCOUNTER — Encounter (HOSPITAL_COMMUNITY): Payer: Self-pay

## 2018-11-29 ENCOUNTER — Encounter (HOSPITAL_COMMUNITY)
Admission: RE | Admit: 2018-11-29 | Discharge: 2018-11-29 | Disposition: A | Discharge status: Home / Self Care | Payer: PPO | Source: Ambulatory Visit | Attending: Orthopedic Surgery | Admitting: Orthopedic Surgery

## 2018-11-29 ENCOUNTER — Other Ambulatory Visit: Payer: Self-pay

## 2018-11-29 DIAGNOSIS — Z01812 Encounter for preprocedural laboratory examination: Secondary | ICD-10-CM | POA: Diagnosis not present

## 2018-11-29 HISTORY — DX: Personal history of transient ischemic attack (TIA), and cerebral infarction without residual deficits: Z86.73

## 2018-11-29 HISTORY — DX: Personal history of colon polyps, unspecified: Z86.0100

## 2018-11-29 HISTORY — DX: Essential (primary) hypertension: I10

## 2018-11-29 HISTORY — DX: Ischemic cardiomyopathy: I25.5

## 2018-11-29 HISTORY — DX: Anxiety disorder, unspecified: F41.9

## 2018-11-29 HISTORY — DX: Presence of spectacles and contact lenses: Z97.3

## 2018-11-29 HISTORY — DX: Obstructive sleep apnea (adult) (pediatric): G47.33

## 2018-11-29 HISTORY — DX: Personal history of urinary calculi: Z87.442

## 2018-11-29 HISTORY — DX: Paroxysmal atrial fibrillation: I48.0

## 2018-11-29 HISTORY — DX: Personal history of colonic polyps: Z86.010

## 2018-11-29 HISTORY — DX: Unspecified osteoarthritis, unspecified site: M19.90

## 2018-11-29 HISTORY — DX: Presence of other cardiac implants and grafts: Z95.818

## 2018-11-29 HISTORY — DX: Benign prostatic hyperplasia with lower urinary tract symptoms: N40.1

## 2018-11-29 HISTORY — DX: Long term (current) use of anticoagulants: Z79.01

## 2018-11-29 HISTORY — DX: Allergic rhinitis, unspecified: J30.9

## 2018-11-29 HISTORY — DX: Gastro-esophageal reflux disease without esophagitis: K21.9

## 2018-11-29 HISTORY — DX: Localized edema: R60.0

## 2018-11-29 LAB — CBC
HCT: 47.9 % (ref 39.0–52.0)
Hemoglobin: 15.6 g/dL (ref 13.0–17.0)
MCH: 30.7 pg (ref 26.0–34.0)
MCHC: 32.6 g/dL (ref 30.0–36.0)
MCV: 94.3 fL (ref 80.0–100.0)
PLATELETS: 214 10*3/uL (ref 150–400)
RBC: 5.08 MIL/uL (ref 4.22–5.81)
RDW: 12.2 % (ref 11.5–15.5)
WBC: 6.8 10*3/uL (ref 4.0–10.5)
nRBC: 0 % (ref 0.0–0.2)

## 2018-11-29 LAB — BASIC METABOLIC PANEL
Anion gap: 7 (ref 5–15)
BUN: 25 mg/dL — ABNORMAL HIGH (ref 8–23)
CO2: 26 mmol/L (ref 22–32)
CREATININE: 0.93 mg/dL (ref 0.61–1.24)
Calcium: 9.4 mg/dL (ref 8.9–10.3)
Chloride: 107 mmol/L (ref 98–111)
Glucose, Bld: 104 mg/dL — ABNORMAL HIGH (ref 70–99)
Potassium: 4.1 mmol/L (ref 3.5–5.1)
Sodium: 140 mmol/L (ref 135–145)

## 2018-11-29 LAB — SURGICAL PCR SCREEN
MRSA, PCR: NEGATIVE
Staphylococcus aureus: NEGATIVE

## 2018-11-29 NOTE — Progress Notes (Addendum)
EKG dated 06-19-2018 in epic.  Last loop recorder check dated 10-28-2018 in epic.  TEE dated 07-31-2017 in epic.  Nuclear stress test dated 07-29-2017 in epic.  Cardiologist, dr g. Smitty Cords note dated 06-19-2018 in epic.  Pt pcp surgical clearance , dr Jennette Kettle, dated 11-14-2018 with chart, lov note dated 11-14-2018 in epic.  Chart to anesthesia, Konrad Felix PA, for review.

## 2018-11-30 ENCOUNTER — Ambulatory Visit (INDEPENDENT_AMBULATORY_CARE_PROVIDER_SITE_OTHER): Payer: PPO | Admitting: *Deleted

## 2018-11-30 DIAGNOSIS — I639 Cerebral infarction, unspecified: Secondary | ICD-10-CM

## 2018-11-30 LAB — CUP PACEART REMOTE DEVICE CHECK
Date Time Interrogation Session: 20200313144039
Implantable Pulse Generator Implant Date: 20181112

## 2018-12-04 NOTE — H&P (Signed)
Patient's anticipated LOS is less than 2 midnights, meeting these requirements: - Younger than 89 - Lives within 1 hour of care - Has a competent adult at home to recover with post-op recover - NO history of  - Chronic pain requiring opiods  - Diabetes  - Coronary Artery Disease  - Heart failure  - Heart attack  - Stroke  - DVT/VTE  - Cardiac arrhythmia  - Respiratory Failure/COPD  - Renal failure  - Anemia  - Advanced Liver disease       ATARI NOVICK is an 78 y.o. male.    Chief Complaint: right knee pain  HPI: Pt is a 78 y.o. male complaining of right knee pain for multiple years. Pain had continually increased since the beginning. X-rays in the clinic show end-stage arthritic changes of the right knee. Pt has tried various conservative treatments which have failed to alleviate their symptoms, including injections and therapy. Various options are discussed with the patient. Risks, benefits and expectations were discussed with the patient. Patient understand the risks, benefits and expectations and wishes to proceed with surgery.   PCP:  [name], MD  D/C Plans: Home  PMH: Past Medical History:  Diagnosis Date  . Allergic rhinitis   . Anticoagulant long-term use    xarelto  . Anxiety   . Benign localized prostatic hyperplasia with lower urinary tract symptoms (LUTS)   . Benzodiazepine dependence ([place])   . Edema of right lower extremity   . GERD (gastroesophageal reflux disease)   . Heart murmur   . History of colonic polyps   . History of CVA (cerebrovascular accident) without residual deficits 07/26/2017   crytogenic stroke -- acute left caudate body infarct   . History of kidney stones   . Hypertension   . Ischemic cardiomyopathy 07/26/2017   ef 35-40%;    TEE 07-31-2017 ef 60-65%  . MDD (major depressive disorder), recurrent, in partial remission ([place])   . OA (osteoarthritis)    knees,   . OSA (obstructive sleep apnea)    intolerant cpap  . PAF  (paroxysmal atrial fibrillation) [facility])    cardiologist-  dr g. taylor  . Status post placement of implantable loop recorder 07/31/2017  . Wears glasses     PSH: Past Surgical History:  Procedure Laterality Date  . APPENDECTOMY  1970s  . CATARACT EXTRACTION W/ INTRAOCULAR LENS  IMPLANT, BILATERAL Bilateral 2014  . CHOLECYSTECTOMY  1970s  . FOOT SURGERY Left yrs ago  . HERNIA REPAIR  2014  . LOOP RECORDER INSERTION N/A 07/31/2017   Procedure: LOOP RECORDER INSERTION;  Surgeon: Evans Lance, MD;  Location: [facility];  Service: Cardiovascular;  Laterality: N/A;  . TEE WITHOUT CARDIOVERSION N/A 07/31/2017   Procedure: TRANSESOPHAGEAL ECHOCARDIOGRAM (TEE) WITH LOOP;  Surgeon: Dorothy Spark, MD;  Location: [facility];  Service: Cardiovascular;  Laterality: N/A;  . WRIST FRACTURE SURGERY  1970s   right x2  unsure if any hardware remain   ;    left x1 with pinning then removed    Social History:  reports that he has never smoked. He has never used smokeless tobacco. He reports that he does not drink alcohol or use drugs.  Allergies:  Allergies  Allergen Reactions  . Codeine Nausea And Vomiting    Must take with food    Medications: No current facility-administered medications for this encounter.    Current Outpatient Medications  Medication Sig Dispense Refill  . ALPRAZolam (XANAX) 1 MG tablet  TAKE 0.5 TABLETS (0.5 MG TOTAL) BY MOUTH 3 (THREE) TIMES DAILY AS NEEDED FOR ANXIETY. 45 tablet 0  . amLODipine (NORVASC) 10 MG tablet Take 1 tablet (10 mg total) by mouth daily. 90 tablet 3  . atorvastatin (LIPITOR) 80 MG tablet Take 80 mg by mouth daily at 6 PM.    . buPROPion (WELLBUTRIN SR) 150 MG 12 hr tablet TAKE 1 TABLET BY MOUTH TWICE A DAY (Patient taking differently: Take 150 mg by mouth 2 (two) times daily. ) 180 tablet 3  . carvedilol (COREG) 6.25 MG tablet Take 1 tablet (6.25 mg total) by mouth 2 (two) times daily with a meal. 180 tablet 2  . diclofenac sodium  (VOLTAREN) 1 % GEL Apply 4 g topically 4 (four) times daily. (Patient taking differently: Apply 4 g topically 4 (four) times daily as needed (pain). ) 1 Tube 1  . dutasteride (AVODART) 0.5 MG capsule Take 1 capsule (0.5 mg total) by mouth daily. (Patient taking differently: Take 0.5 mg by mouth every evening. ) 90 capsule 3  . fluorometholone (FML) 0.1 % ophthalmic suspension Place 1 drop into both eyes 2 (two) times daily.   0  . mirtazapine (REMERON) 15 MG tablet Take 1 tablet (15 mg total) by mouth at bedtime. 30 tablet 3  . omeprazole (PRILOSEC) 10 MG capsule TAKE ONE CAPSULE BY MOUTH EVERY DAY 90 capsule 3  . RESTASIS 0.05 % ophthalmic emulsion Place 1 drop 2 (two) times daily into both eyes.  4  . rivaroxaban (XARELTO) 20 MG TABS tablet Take 1 tablet (20 mg total) by mouth daily with supper. 90 tablet 3  . timolol (TIMOPTIC) 0.5 % ophthalmic solution Place 1 drop daily into both eyes.  0  . valsartan (DIOVAN) 80 MG tablet Take 1 tablet (80 mg total) by mouth daily. (Patient taking differently: Take 80 mg by mouth every evening. ) 90 tablet 3  . vitamin B-12 (CYANOCOBALAMIN) 1000 MCG tablet Take 1,000 mcg by mouth 2 (two) times daily.      No results found for this or any previous visit (from the past 48 hour(s)). No results found.  ROS: Pain with rom of the right lower extremity  Physical Exam: Alert and oriented 78 y.o. male in no acute distress Cranial nerves 2-12 intact Cervical spine: full rom with no tenderness, nv intact distally Chest: active breath sounds bilaterally, no wheeze rhonchi or rales Heart: regular rate and rhythm, no murmur Abd: non tender non distended with active bowel sounds Hip is stable with rom  Right knee medial and lateral joint line tenderness nv intact distally Antalgic gait No rashes or edema  Assessment/Plan Assessment: right knee end stage osteoarthritis  Plan:  Patient will undergo a right total knee by Dr. Veverly Fells at [facility]. Risks  benefits and expectations were discussed with the patient. Patient understand risks, benefits and expectations and wishes to proceed. Preoperative templating of the joint replacement has been completed, documented, and submitted to the Operating Room personnel in order to optimize intra-operative equipment management.   Merla Riches PA-C, MPAS [facility] is now [employer] [facility] [address], [place], [place], [place] [address] Phone: [phone] [email or web address] Facebook  [employer]

## 2018-12-06 ENCOUNTER — Ambulatory Visit: Payer: PPO | Admitting: Psychology

## 2018-12-07 ENCOUNTER — Encounter (HOSPITAL_COMMUNITY): Admission: RE | Payer: Self-pay | Source: Home / Self Care

## 2018-12-07 ENCOUNTER — Inpatient Hospital Stay (HOSPITAL_COMMUNITY): Admission: RE | Admit: 2018-12-07 | Payer: PPO | Source: Home / Self Care | Admitting: Orthopedic Surgery

## 2018-12-07 ENCOUNTER — Telehealth: Payer: Self-pay

## 2018-12-07 SURGERY — ARTHROPLASTY, KNEE, TOTAL
Anesthesia: Choice | Laterality: Right

## 2018-12-07 NOTE — Progress Notes (Signed)
Carelink Summary Report / Loop Recorder 

## 2018-12-07 NOTE — Telephone Encounter (Signed)
Left message for patient to remind of missed remote transmission.  

## 2018-12-11 ENCOUNTER — Other Ambulatory Visit: Payer: Self-pay | Admitting: Internal Medicine

## 2018-12-18 ENCOUNTER — Other Ambulatory Visit: Payer: Self-pay | Admitting: Internal Medicine

## 2018-12-18 NOTE — Telephone Encounter (Signed)
Xanax Last filled 11/20/2018 #45 Last OV 09-20-18 Next OV 12-27-18 [facility]

## 2018-12-19 DIAGNOSIS — M7989 Other specified soft tissue disorders: Secondary | ICD-10-CM

## 2018-12-19 HISTORY — DX: Other specified soft tissue disorders: M79.89

## 2018-12-21 ENCOUNTER — Telehealth: Payer: Self-pay | Admitting: Internal Medicine

## 2018-12-21 NOTE — Telephone Encounter (Signed)
I left a detailed message on patient's v.m. to call back and cancel appointment and I'll call patient back to r/s appointment when we update Dr. Alla German schedule.

## 2018-12-27 ENCOUNTER — Encounter: Payer: Self-pay | Admitting: Internal Medicine

## 2018-12-31 ENCOUNTER — Telehealth: Payer: Self-pay | Admitting: Cardiology

## 2018-12-31 NOTE — Telephone Encounter (Signed)
LMOVM for pt to return call. I need to re educate patient about his home monitor and that he doesn't have to send manual transmissions every day.

## 2019-01-01 NOTE — Telephone Encounter (Signed)
LMOVM for pt to return call for the 2nd time.

## 2019-01-02 ENCOUNTER — Other Ambulatory Visit: Payer: Self-pay

## 2019-01-02 ENCOUNTER — Ambulatory Visit (INDEPENDENT_AMBULATORY_CARE_PROVIDER_SITE_OTHER): Payer: PPO | Admitting: *Deleted

## 2019-01-02 DIAGNOSIS — I639 Cerebral infarction, unspecified: Secondary | ICD-10-CM

## 2019-01-02 DIAGNOSIS — I480 Paroxysmal atrial fibrillation: Secondary | ICD-10-CM

## 2019-01-02 LAB — CUP PACEART REMOTE DEVICE CHECK
Date Time Interrogation Session: 20200415132423
Implantable Pulse Generator Implant Date: 20181112

## 2019-01-03 NOTE — Telephone Encounter (Signed)
This is the 3rd attempt to get the pt on the phone to educate him on when to send a manual transmission. I got his voicemail and left my direct office number to return my phone call.

## 2019-01-04 ENCOUNTER — Encounter: Payer: Self-pay | Admitting: Family Medicine

## 2019-01-04 ENCOUNTER — Telehealth: Payer: Self-pay

## 2019-01-04 ENCOUNTER — Other Ambulatory Visit: Payer: Self-pay

## 2019-01-04 ENCOUNTER — Ambulatory Visit (INDEPENDENT_AMBULATORY_CARE_PROVIDER_SITE_OTHER)
Admission: RE | Admit: 2019-01-04 | Discharge: 2019-01-04 | Disposition: A | Discharge status: Home / Self Care | Payer: PPO | Source: Ambulatory Visit | Attending: Family Medicine | Admitting: Family Medicine

## 2019-01-04 ENCOUNTER — Ambulatory Visit (INDEPENDENT_AMBULATORY_CARE_PROVIDER_SITE_OTHER): Payer: PPO | Admitting: Family Medicine

## 2019-01-04 VITALS — Temp 98.7°F

## 2019-01-04 DIAGNOSIS — M79671 Pain in right foot: Secondary | ICD-10-CM | POA: Diagnosis not present

## 2019-01-04 DIAGNOSIS — M7989 Other specified soft tissue disorders: Secondary | ICD-10-CM

## 2019-01-04 NOTE — Progress Notes (Signed)
Virtual Visit via Video Note  I connected with Jared [name] on 01/04/19 at  2:30 PM EDT by a video enabled telemedicine application and verified that I am speaking with the correct person using two identifiers. The patient is at home today  I am in my office  Virtual video was attempted and failed- so the visit was carried out by phone The patient was at home today  I was in my office at [facility] at [place]   I discussed the limitations of evaluation and management by telemedicine and the availability of in person appointments. The patient expressed understanding and agreed to proceed.  History of Present Illness: Right foot swelling  Worse in past several days - but going on for over a month   Using a new type of shoe for several months   (crocks)  Thought it was making top of foot swell  Then had a hard time getting his shoes on  ? If trauma to the foot   A little warmth to the touch Not red  No bruising  A little tender to the touch - top of the foot at base of toes (toes swell a bit also and feel tight )  No toe hurts more than another  Hard to get comfortable - better off to the side Is no longer wearing shoes  Has not wrapped it  No open skin or source of infection  No fever  No in grown nails , he has to cut nails more often lately however  Thickened big nail   occ takes an acetaminophen   Does improve with elevation   No h/o swelling  No h/o heart failure  No varicose veins   No trauma that he knows of   Needs a knee replacement (R knee) = that is painful and affects his gait   78 yo pt of Dr Silvio Pate with hx of a fib and CVA taking xarelto  No fever/malaise/sob or other symptoms   Review of Systems  Constitutional: Negative for chills, diaphoresis, fever, malaise/fatigue and weight loss.  Respiratory: Negative for cough and shortness of breath.   Cardiovascular: Negative for chest pain, palpitations, claudication, leg swelling and PND.       No varicose  veins No swelling other than right foot  Musculoskeletal: Positive for joint pain. Negative for myalgias.  Skin: Negative for itching and rash.  Neurological: Negative for dizziness, tingling, sensory change and focal weakness.     Xray of foot: Dg Foot Complete Right  Result Date: 01/04/2019 CLINICAL DATA:  Right foot pain and swelling. EXAM: RIGHT FOOT COMPLETE - 3+ VIEW COMPARISON:  None. FINDINGS: There is no evidence of fracture or dislocation. There is no evidence of arthropathy or other focal bone abnormality. Soft tissues are unremarkable. IMPRESSION: Negative. Electronically Signed   By: Marijo Conception M.D.   On: 01/04/2019 15:33    Patient Active Problem List   Diagnosis Date Noted  . Swelling of right foot 01/04/2019  . Preoperative evaluation to rule out surgical contraindication 11/14/2018  . Hematuria 10/28/2018  . Osteoarthritis of right knee 06/20/2018  . Atrial fibrillation ([place])   . MDD (major depressive disorder), recurrent, in partial remission ([place])   . Benzodiazepine dependence ([place])   . Ejection fraction < 50% 11/07/2017  . Hyperlipidemia   . Dizziness 07/27/2017  . Frequent PVCs 07/27/2017  . Cryptogenic stroke ([place]) 07/27/2017  . Diverticulosis of colon without hemorrhage 02/06/2013  . Right inguinal hernia 10/12/2012  .  BPH (benign prostatic hyperplasia) 04/08/2009  . OSA (obstructive sleep apnea) 10/02/2008  . Essential hypertension 10/05/2007  . Allergic rhinitis 10/05/2007  . GERD 10/05/2007  . History of colonic polyps 10/05/2007  . NEPHROLITHIASIS, HX OF 10/05/2007   Past Medical History:  Diagnosis Date  . Allergic rhinitis   . Anticoagulant long-term use    xarelto  . Anxiety   . Benign localized prostatic hyperplasia with lower urinary tract symptoms (LUTS)   . Benzodiazepine dependence ([place])   . Edema of right lower extremity   . GERD (gastroesophageal reflux disease)   . Heart murmur   . History of colonic polyps   . History of CVA  (cerebrovascular accident) without residual deficits 07/26/2017   crytogenic stroke -- acute left caudate body infarct   . History of kidney stones   . Hypertension   . Ischemic cardiomyopathy 07/26/2017   ef 35-40%;    TEE 07-31-2017 ef 60-65%  . MDD (major depressive disorder), recurrent, in partial remission ([place])   . OA (osteoarthritis)    knees,   . OSA (obstructive sleep apnea)    intolerant cpap  . PAF (paroxysmal atrial fibrillation) [facility])    cardiologist-  dr g. taylor  . Status post placement of implantable loop recorder 07/31/2017  . Wears glasses    Past Surgical History:  Procedure Laterality Date  . APPENDECTOMY  1970s  . CATARACT EXTRACTION W/ INTRAOCULAR LENS  IMPLANT, BILATERAL Bilateral 2014  . CHOLECYSTECTOMY  1970s  . FOOT SURGERY Left yrs ago  . HERNIA REPAIR  2014  . LOOP RECORDER INSERTION N/A 07/31/2017   Procedure: LOOP RECORDER INSERTION;  Surgeon: Evans Lance, MD;  Location: [facility];  Service: Cardiovascular;  Laterality: N/A;  . TEE WITHOUT CARDIOVERSION N/A 07/31/2017   Procedure: TRANSESOPHAGEAL ECHOCARDIOGRAM (TEE) WITH LOOP;  Surgeon: Dorothy Spark, MD;  Location: [facility];  Service: Cardiovascular;  Laterality: N/A;  . WRIST FRACTURE SURGERY  1970s   right x2  unsure if any hardware remain   ;    left x1 with pinning then removed   Social History   Tobacco Use  . Smoking status: Never Smoker  . Smokeless tobacco: Never Used  Substance Use Topics  . Alcohol use: No  . Drug use: Never   Family History  Problem Relation Age of Onset  . Heart disease Mother   . Heart disease Father   . Colon cancer Maternal Aunt 70   Allergies  Allergen Reactions  . Codeine Nausea And Vomiting    Must take with food   Current Outpatient Medications on File Prior to Visit  Medication Sig Dispense Refill  . ALPRAZolam (XANAX) 1 MG tablet TAKE 0.5 TABLETS (0.5 MG TOTAL) BY MOUTH 3 (THREE) TIMES DAILY AS NEEDED FOR ANXIETY. 45 tablet  0  . amLODipine (NORVASC) 10 MG tablet Take 1 tablet (10 mg total) by mouth daily. 90 tablet 3  . atorvastatin (LIPITOR) 80 MG tablet Take 80 mg by mouth daily at 6 PM.    . buPROPion (WELLBUTRIN SR) 150 MG 12 hr tablet TAKE 1 TABLET BY MOUTH TWICE A DAY (Patient taking differently: Take 150 mg by mouth 2 (two) times daily. ) 180 tablet 3  . carvedilol (COREG) 6.25 MG tablet Take 1 tablet (6.25 mg total) by mouth 2 (two) times daily with a meal. 180 tablet 2  . diclofenac sodium (VOLTAREN) 1 % GEL Apply 4 g topically 4 (four) times daily. (Patient taking differently: Apply 4 g  topically 4 (four) times daily as needed (pain). ) 1 Tube 1  . dutasteride (AVODART) 0.5 MG capsule Take 1 capsule (0.5 mg total) by mouth daily. (Patient taking differently: Take 0.5 mg by mouth every evening. ) 90 capsule 3  . fluorometholone (FML) 0.1 % ophthalmic suspension Place 1 drop into both eyes 2 (two) times daily.   0  . mirtazapine (REMERON) 15 MG tablet Take 1 tablet (15 mg total) by mouth at bedtime. 30 tablet 3  . omeprazole (PRILOSEC) 10 MG capsule TAKE ONE CAPSULE BY MOUTH EVERY DAY 90 capsule 3  . RESTASIS 0.05 % ophthalmic emulsion Place 1 drop 2 (two) times daily into both eyes.  4  . rivaroxaban (XARELTO) 20 MG TABS tablet Take 1 tablet (20 mg total) by mouth daily with supper. 90 tablet 3  . timolol (TIMOPTIC) 0.5 % ophthalmic solution Place 1 drop daily into both eyes.  0  . valsartan (DIOVAN) 80 MG tablet Take 1 tablet (80 mg total) by mouth daily. (Patient taking differently: Take 80 mg by mouth every evening. ) 90 tablet 3  . vitamin B-12 (CYANOCOBALAMIN) 1000 MCG tablet Take 1,000 mcg by mouth 2 (two) times daily.     No current facility-administered medications on file prior to visit.       Observations/Objective: Pt sounds well  Nl affect No cough or hoarseness Not sob with speech  Assessment and Plan: Problem List Items Addressed This Visit      Other   Swelling of right foot    With  no redness and minimal discomfort but some tenderness (reported by pt) at top of the foot This is the same side as his R knee OA and gait is affected He thinks this may be caused by wearing mal fitting shoe for several months (crocks)  Xray today was negative for fracture or other bony abnormality  Advised pt to stop wearing the crocks, to elevate foot when able and use ice/cool compress for swelling  Watch for leg swelling/rash or any redness and update  If worse or no improvement -needs office visit in person for examination       Relevant Orders   DG Foot Complete Right (Completed)       Follow Up Instructions: Elevate foot  Ice/cold compress when able for 10 minutes at a time  Avoid the crocks shoes or any foot wear that is uncomfortable If worse swelling/tenderness or pain - alert [place] and follow up in the office If any redness - call immediately and follow up (seek care out of office if necessary)    I discussed the assessment and treatment plan with the patient. The patient was provided an opportunity to ask questions and all were answered. The patient agreed with the plan and demonstrated an understanding of the instructions.   The patient was advised to call back or seek an in-person evaluation if the symptoms worsen or if the condition fails to improve as anticipated.   Loura Pardon, MD

## 2019-01-04 NOTE — Telephone Encounter (Signed)
Spoke w/ pt and informed him that he doesn't have to send manual transmission. We will call him we need him to do it. Pt verbalized understanding.

## 2019-01-04 NOTE — Telephone Encounter (Signed)
Virtual appt scheduled see appt notes for additional info.

## 2019-01-04 NOTE — Telephone Encounter (Signed)
Copied from [place] [phone]. Topic: Appointment Scheduling - Scheduling Inquiry for Clinic >> Jan 03, 2019  4:46 PM Alanda Slim E wrote: Reason for CRM:  Pt has swelling of the right foot/ Pt can not put shoe on/ please call Pt to schedule appt

## 2019-01-06 NOTE — Assessment & Plan Note (Signed)
With no redness and minimal discomfort but some tenderness (reported by pt) at top of the foot This is the same side as his R knee OA and gait is affected He thinks this may be caused by wearing mal fitting shoe for several months (crocks)  Xray today was negative for fracture or other bony abnormality  Advised pt to stop wearing the crocks, to elevate foot when able and use ice/cool compress for swelling  Watch for leg swelling/rash or any redness and update  If worse or no improvement -needs office visit in person for examination

## 2019-01-08 NOTE — Progress Notes (Signed)
Carelink Summary Report / Loop Recorder 

## 2019-01-13 ENCOUNTER — Other Ambulatory Visit: Payer: Self-pay | Admitting: Internal Medicine

## 2019-01-16 ENCOUNTER — Other Ambulatory Visit: Payer: Self-pay | Admitting: Internal Medicine

## 2019-01-16 NOTE — Telephone Encounter (Signed)
Last filled 12-19-18 #45 Last OV Acute 01-04-19 Next OV 06-17-19 [facility]

## 2019-01-23 ENCOUNTER — Telehealth: Payer: Self-pay | Admitting: *Deleted

## 2019-01-23 NOTE — Telephone Encounter (Signed)
Patient called stating that Dr. Silvio Pate put him on Xarelto . Patient stated that he started noticing about 3 weeks ago that his right foot is swelling during the day, but improves during the night. Patient stated that he is wondering if this could be caused by the Xarelto?

## 2019-01-24 NOTE — Telephone Encounter (Signed)
Spoke to pt. He said when he woke up this morning, it had cleared up. He will call for an OV if it returns.

## 2019-01-24 NOTE — Telephone Encounter (Signed)
I don't think that the xarelto would cause the foot swelling (unless he had hit it and then it bruised). It is more likely related to his veins not draining back up properly. I see he had a failed video visit a while back about this---we should probably set him up in office if this is an ongoing issue

## 2019-02-04 ENCOUNTER — Encounter: Payer: PPO | Admitting: *Deleted

## 2019-02-04 ENCOUNTER — Other Ambulatory Visit: Payer: Self-pay

## 2019-02-06 DIAGNOSIS — R310 Gross hematuria: Secondary | ICD-10-CM | POA: Diagnosis not present

## 2019-02-06 DIAGNOSIS — N132 Hydronephrosis with renal and ureteral calculous obstruction: Secondary | ICD-10-CM | POA: Diagnosis not present

## 2019-02-13 ENCOUNTER — Encounter (HOSPITAL_COMMUNITY): Payer: Self-pay

## 2019-02-13 NOTE — Progress Notes (Signed)
SPOKE W/  Wilkie     SCREENING SYMPTOMS OF COVID 19:   COUGH--NO  RUNNY NOSE--- NO  SORE THROAT---NO  NASAL CONGESTION----NO  SNEEZING----NO  SHORTNESS OF BREATH---NO  DIFFICULTY BREATHING---NO  TEMP >100.0 -----NO  UNEXPLAINED BODY ACHES------NO  CHILLS -------- NO  HEADACHES ---------NO  LOSS OF SMELL/ TASTE --------NO    HAVE YOU OR ANY FAMILY MEMBER TRAVELLED PAST 14 DAYS OUT OF THE   COUNTY---TRAVELLED [place] STATE----NO COUNTRY----NO  HAVE YOU OR ANY FAMILY MEMBER BEEN EXPOSED TO ANYONE WITH COVID 19? NO

## 2019-02-13 NOTE — Patient Instructions (Addendum)
DUE TO COVID-19 NO VISITORS ARE ALLOWED IN THE HOSPITAL AT THIS TIME   COVID SWAB TESTING MUST BE COMPLETED ON: Tuesday, February 19, 2019 at 10:00AM (Must self quarantine after testing. Follow instructions on handout.)   Your procedure is scheduled on: Friday, February 22, 2019   Surgery Time: 12:36PM-2:31PM   Report to [facility]  Entrance   Report to Short Stay at 10:00 AM   Call this number if you have problems the morning of surgery [phone]   Do not eat food or drink liquids :After Midnight.   Brush your teeth the morning of surgery.   Do NOT smoke after Midnight   Take these medicines the morning of surgery with A SIP OF WATER: Amlodipine, Bupropion, Carvedilol, Dustateride, Omeprazole, Alprazolam if needed   May use eye drops day of surgery                               You may not have any metal on your body including jewelry, and body piercings             Do not wear lotions, powders, perfumes/cologne, or deodorant                          Men may shave face and neck.   Do not bring valuables to the hospital. [place].   Contacts, dentures or bridgework may not be worn into surgery.   Bring small overnight bag day of surgery.    Special Instructions: Bring a copy of your healthcare power of attorney and living will documents         the day of surgery if you haven't scanned them in before.              Please read over the following fact sheets you were given:  [facility] - Preparing for Surgery Before surgery, you can play an important role.  Because skin is not sterile, your skin needs to be as free of germs as possible.  You can reduce the number of germs on your skin by washing with CHG (chlorahexidine gluconate) soap before surgery.  CHG is an antiseptic cleaner which kills germs and bonds with the skin to continue killing germs even after washing. Please DO NOT use if you have an allergy to CHG or  antibacterial soaps.  If your skin becomes reddened/irritated stop using the CHG and inform your nurse when you arrive at Short Stay. Do not shave (including legs and underarms) for at least 48 hours prior to the first CHG shower.  You may shave your face/neck.  Please follow these instructions carefully:  1.  Shower with CHG Soap the night before surgery and the  morning of surgery.  2.  If you choose to wash your hair, wash your hair first as usual with your normal  shampoo.  3.  After you shampoo, rinse your hair and body thoroughly to remove the shampoo.                             4.  Use CHG as you would any other liquid soap.  You can apply chg directly to the skin and wash.  Gently with a scrungie or clean washcloth.  5.  Apply the CHG Soap to your body ONLY FROM THE NECK DOWN.   Do   not use on face/ open                           Wound or open sores. Avoid contact with eyes, ears mouth and   genitals (private parts).                       Wash face,  Genitals (private parts) with your normal soap.             6.  Wash thoroughly, paying special attention to the area where your    surgery  will be performed.  7.  Thoroughly rinse your body with warm water from the neck down.  8.  DO NOT shower/wash with your normal soap after using and rinsing off the CHG Soap.                9.  Pat yourself dry with a clean towel.            10.  Wear clean pajamas.            11.  Place clean sheets on your bed the night of your first shower and do not  sleep with pets. Day of Surgery : Do not apply any lotions/deodorants the morning of surgery.  Please wear clean clothes to the hospital/surgery center.  FAILURE TO FOLLOW THESE INSTRUCTIONS MAY RESULT IN THE CANCELLATION OF YOUR SURGERY  PATIENT SIGNATURE_________________________________  NURSE SIGNATURE__________________________________  ________________________________________________________________________   Jared [name]  An  incentive spirometer is a tool that can help keep your lungs clear and active. This tool measures how well you are filling your lungs with each breath. Taking long deep breaths may help reverse or decrease the chance of developing breathing (pulmonary) problems (especially infection) following:  A long period of time when you are unable to move or be active. BEFORE THE PROCEDURE   If the spirometer includes an indicator to show your best effort, your nurse or respiratory therapist will set it to a desired goal.  If possible, sit up straight or lean slightly forward. Try not to slouch.  Hold the incentive spirometer in an upright position. INSTRUCTIONS FOR USE  1. Sit on the edge of your bed if possible, or sit up as far as you can in bed or on a chair. 2. Hold the incentive spirometer in an upright position. 3. Breathe out normally. 4. Place the mouthpiece in your mouth and seal your lips tightly around it. 5. Breathe in slowly and as deeply as possible, raising the piston or the ball toward the top of the column. 6. Hold your breath for 3-5 seconds or for as long as possible. Allow the piston or ball to fall to the bottom of the column. 7. Remove the mouthpiece from your mouth and breathe out normally. 8. Rest for a few seconds and repeat Steps 1 through 7 at least 10 times every 1-2 hours when you are awake. Take your time and take a few normal breaths between deep breaths. 9. The spirometer may include an indicator to show your best effort. Use the indicator as a goal to work toward during each repetition. 10. After each set of 10 deep breaths, practice coughing to be sure your lungs are clear. If you have an incision (the cut made at the time of surgery), support  your incision when coughing by placing a pillow or rolled up towels firmly against it. Once you are able to get out of bed, walk around indoors and cough well. You may stop using the incentive spirometer when instructed by your  caregiver.  RISKS AND COMPLICATIONS  Take your time so you do not get dizzy or light-headed.  If you are in pain, you may need to take or ask for pain medication before doing incentive spirometry. It is harder to take a deep breath if you are having pain. AFTER USE  Rest and breathe slowly and easily.  It can be helpful to keep track of a log of your progress. Your caregiver can provide you with a simple table to help with this. If you are using the spirometer at home, follow these instructions: [place] IF:   You are having difficultly using the spirometer.  You have trouble using the spirometer as often as instructed.  Your pain medication is not giving enough relief while using the spirometer.  You develop fever of 100.5 F (38.1 C) or higher. SEEK IMMEDIATE MEDICAL CARE IF:   You cough up bloody sputum that had not been present before.  You develop fever of 102 F (38.9 C) or greater.  You develop worsening pain at or near the incision site. MAKE SURE YOU:   Understand these instructions.  Will watch your condition.  Will get help right away if you are not doing well or get worse. Document Released: 01/16/2007 Document Revised: 11/28/2011 Document Reviewed: 03/19/2007 [facility] Patient Information 2014 [place], [place].   ________________________________________________________________________

## 2019-02-13 NOTE — Pre-Procedure Instructions (Signed)
The following are in epic: Surgical clearance Dr. Silvio Pate 11/14/2018 EKG 06/19/2018 ECHO 07/2017 Loop recorder check 01/02/2019

## 2019-02-14 ENCOUNTER — Other Ambulatory Visit: Payer: Self-pay

## 2019-02-14 ENCOUNTER — Encounter (HOSPITAL_COMMUNITY): Payer: Self-pay

## 2019-02-14 ENCOUNTER — Encounter (HOSPITAL_COMMUNITY)
Admission: RE | Admit: 2019-02-14 | Discharge: 2019-02-14 | Disposition: A | Discharge status: Home / Self Care | Payer: PPO | Source: Ambulatory Visit | Attending: Orthopedic Surgery | Admitting: Orthopedic Surgery

## 2019-02-14 DIAGNOSIS — R310 Gross hematuria: Secondary | ICD-10-CM | POA: Diagnosis not present

## 2019-02-14 DIAGNOSIS — K863 Pseudocyst of pancreas: Secondary | ICD-10-CM | POA: Diagnosis not present

## 2019-02-14 DIAGNOSIS — N281 Cyst of kidney, acquired: Secondary | ICD-10-CM | POA: Diagnosis not present

## 2019-02-14 DIAGNOSIS — N202 Calculus of kidney with calculus of ureter: Secondary | ICD-10-CM | POA: Diagnosis not present

## 2019-02-14 DIAGNOSIS — M1711 Unilateral primary osteoarthritis, right knee: Secondary | ICD-10-CM | POA: Diagnosis not present

## 2019-02-14 DIAGNOSIS — Z01812 Encounter for preprocedural laboratory examination: Secondary | ICD-10-CM | POA: Diagnosis not present

## 2019-02-14 HISTORY — DX: Unspecified malignant neoplasm of skin, unspecified: C44.90

## 2019-02-14 HISTORY — DX: Hematuria, unspecified: R31.9

## 2019-02-14 HISTORY — DX: Other diseases of pharynx: J39.2

## 2019-02-14 HISTORY — DX: Inflammatory liver disease, unspecified: K75.9

## 2019-02-14 LAB — CBC
HCT: 43.6 % (ref 39.0–52.0)
Hemoglobin: 14.8 g/dL (ref 13.0–17.0)
MCH: 31.7 pg (ref 26.0–34.0)
MCHC: 33.9 g/dL (ref 30.0–36.0)
MCV: 93.4 fL (ref 80.0–100.0)
Platelets: 214 10*3/uL (ref 150–400)
RBC: 4.67 MIL/uL (ref 4.22–5.81)
RDW: 12.2 % (ref 11.5–15.5)
WBC: 6.7 10*3/uL (ref 4.0–10.5)
nRBC: 0 % (ref 0.0–0.2)

## 2019-02-14 LAB — SURGICAL PCR SCREEN
MRSA, PCR: NEGATIVE
Staphylococcus aureus: NEGATIVE

## 2019-02-15 ENCOUNTER — Other Ambulatory Visit: Payer: Self-pay | Admitting: Internal Medicine

## 2019-02-15 NOTE — Telephone Encounter (Signed)
Last filled 01-16-19 #45 Last OV Acute 01-04-19 Next OV 06-17-19 [facility]

## 2019-02-19 ENCOUNTER — Other Ambulatory Visit: Payer: Self-pay

## 2019-02-19 ENCOUNTER — Other Ambulatory Visit (HOSPITAL_COMMUNITY)
Admission: RE | Admit: 2019-02-19 | Discharge: 2019-02-19 | Disposition: A | Discharge status: Home / Self Care | Payer: PPO | Source: Ambulatory Visit | Attending: Orthopedic Surgery | Admitting: Orthopedic Surgery

## 2019-02-19 DIAGNOSIS — Z1159 Encounter for screening for other viral diseases: Secondary | ICD-10-CM | POA: Insufficient documentation

## 2019-02-19 NOTE — Anesthesia Preprocedure Evaluation (Addendum)
Anesthesia Evaluation  Patient identified by MRN, date of birth, ID band Patient awake    Reviewed: Allergy & Precautions, NPO status , Patient's Chart, lab work & pertinent test results, reviewed documented beta blocker date and time   Airway Mallampati: I       Dental no notable dental hx. (+) Teeth Intact   Pulmonary sleep apnea ,    Pulmonary exam normal breath sounds clear to auscultation       Cardiovascular hypertension, Pt. on medications and Pt. on home beta blockers Normal cardiovascular exam Rhythm:Regular Rate:Normal     Neuro/Psych PSYCHIATRIC DISORDERS Anxiety Depression CVA, No Residual Symptoms    GI/Hepatic GERD  Medicated,  Endo/Other  negative endocrine ROS  Renal/GU negative Renal ROS  negative genitourinary   Musculoskeletal  (+) Arthritis , Osteoarthritis,    Abdominal (+) + obese,   Peds  Hematology negative hematology ROS (+)   Anesthesia Other Findings Result status: Final result                             *[place]*                   *[facility]*                         [address], [place] [address]                            [phone]  ------------------------------------------------------------------- Transesophageal Echocardiography  Patient:    Jared [name], Jared [name] MR #:       [id number] Study Date: 07/31/2017 Gender:     M Age:        78 Height: Weight: BSA: Pt. Status: Room:   Vito Berger, MD  [place], MD  ATTENDING  Berle Mull M  ADMITTING  Fuller Plan A  cc:  ------------------------------------------------------------------- LV EF: 60% -   65%  ------------------------------------------------------------------- Study Conclusions  - Left ventricle: Systolic function was normal. The estimated   ejection fraction was in the range of 60% to 65%. Wall motion  was   normal; there were no regional wall motion abnormalities. - Aorta: There was severe non-mobile atheroma. - Ascending aorta: The ascending aorta was normal in size. - Descending aorta: The descending aorta was normal in size. - Mitral valve: There was mild regurgitation. - Left atrium: No evidence of thrombus in the atrial cavity or   appendage. No evidence of thrombus in the atrial cavity or   appendage. - Right ventricle: Systolic function was normal. - Right atrium: The atrium was dilated. No evidence of thrombus in   the atrial cavity or appendage. - Atrial septum: No defect or patent foramen ovale was identified. - Pericardium, extracardiac: There was no pericardial effusion.  Impressions:  - No cardiac source of emboli was indentified.  ------------------------------------------------------------------- Study data:   Procedure:  Initial setup. The patient was brought to the laboratory. Surface ECG leads were monitored. Sedation. Conscious sedation was administered. Transesophageal echocardiography. Topical anesthesia was obtained using viscous lidocaine. A transesophageal probe was inserted by the attending cardiologist. Image quality was adequate.  Study completion:  The patient tolerated the procedure well. There were no complications.  Diagnostic transesophageal echocardiography.  2D and color Doppler.  Birthdate:  Patient birthdate: May 09, 1941.  Age:  Patient is 78 yr old.  Sex:  Gender: male.  Study date:  Study date: 07/31/2017. Study time: 03:18 PM.  -------------------------------------------------------------------  ------------------------------------------------------------------- Left ventricle:  Systolic function was normal. The estimated ejection fraction was in the range of 60% to 65%. Wall motion was normal; there were no regional wall motion abnormalities.  ------------------------------------------------------------------- Aortic  valve:   Structurally normal valve. Trileaflet; normal thickness leaflets. Cusp separation was normal.  Doppler:  There was no significant regurgitation.  ------------------------------------------------------------------- Aorta:  There was severe non-mobile atheroma. There was no evidence for dissection. Aortic root: The aortic root was not dilated. Ascending aorta: The ascending aorta was normal in size. Descending aorta: The descending aorta was normal in size.  ------------------------------------------------------------------- Mitral valve:   Structurally normal valve.   Leaflet separation was normal.  Doppler:  There was mild regurgitation.  ------------------------------------------------------------------- Left atrium:  The atrium was normal in size.  No evidence of thrombus in the atrial cavity or appendage.  No evidence of thrombus in the atrial cavity or appendage. The appendage was morphologically a left appendage, multilobulated, and of normal size. Emptying velocity was normal.  ------------------------------------------------------------------- Atrial septum:  No defect or patent foramen ovale was identified.   ------------------------------------------------------------------- Right ventricle:  The cavity size was normal. Wall thickness was normal. Systolic function was normal.  ------------------------------------------------------------------- Pulmonic valve:    Structurally normal valve.  ------------------------------------------------------------------- Tricuspid valve:   Structurally normal valve.   Leaflet separation was normal.  Doppler:  There was mild regurgitation.  ------------------------------------------------------------------- Pulmonary artery:   The main pulmonary artery was normal-sized.  ------------------------------------------------------------------- Right atrium:  The atrium was dilated.  No evidence of thrombus in the atrial  cavity or appendage. The appendage was morphologically a right appendage.  ------------------------------------------------------------------- Pericardium:  There was no pericardial effusion.   ------------------------------------------------------------------- Prepared and Electronically Authenticated by  [name], M.D. 2018-11-12T15:44:16 [employer]   Show images for ECHO TEE Images on [employer]   Show images for [name] Performing Technologist/Nurse   Performing Technologist/Nurse: [name], RDCS Reason for Exam  Priority: Routine  Comments:             Surgical History   Surgical History    No past medical history on file.  Other Surgical History    Procedure Laterality Date Comment Source APPENDECTOMY  1970s  Provider CATARACT EXTRACTION W/ INTRAOCULAR LENS IMPLANT, BILATERAL Bilateral 2014  Provider CHOLECYSTECTOMY  1970s  Provider COLONOSCOPY    Provider CYSTOSCOPY    Provider FOOT SURGERY Left yrs ago  Provider HERNIA REPAIR  2014  Provider LOOP RECORDER INSERTION N/A 07/31/2017 Procedure: LOOP RECORDER INSERTION; Surgeon: Evans Lance, MD; Location: [facility]; Service: Cardiovascular; Laterality: N/A; Provider TEE WITHOUT CARDIOVERSION N/A 07/31/2017 Procedure: TRANSESOPHAGEAL ECHOCARDIOGRAM (TEE) WITH LOOP; Surgeon: Dorothy Spark, MD; Location: [facility]; Service: Cardiovascular; Laterality: N/A; Provider WRIST FRACTURE SURGERY  1970s right x2 unsure if any hardware remain  ;  left x1 with pinning then removed Provider  Implants    Prosthesis/Implant Heart Loop Reveal Linqsys - [id number] - Implanted     Inventory item: LOOP REVEAL LINQSYS Model/Cat number: LINQSYS Serial number: [id number] Manufacturer: [employer] As of 07/31/2017     Status: Implanted    Implant Merm Lnq11 Reveal Linq [id number] - Implanted     Model/Cat number: [id number] REVEAL  LINQ Serial number: [id number] Manufacturer: [employer]  As of 01/02/2019     Status: Implanted    Order-Level Documents - 07/26/2017:   Scan on 07/29/2017 1:07 PM by Default, Provider, MD  Scan on 07/27/2017 3:34 PM by Default, Provider, MD    Encounter-Level Documents - 07/26/2017:   Scan on 08/07/2017 3:32 PM by Default, Provider, MD  Scan on 08/01/2017 1:43 PM by Default, Provider, MD  Scan on 08/01/2017 1:25 PM by Default, Provider, MD  Document on 07/31/2017 6:20 PM by Hazle Quant, RN: IP After Visit Summary  Document on 07/31/2017 5:45 AM by Deatra Ina, RN: ED PB Summary  Scan on 07/29/2017 10:37 AM by Default, Provider, MD  Scan on 07/31/2017 9:05 AM by Default, Provider, MD  Scan on 07/31/2017 7:58 PM by Default, Provider, MD  Electronic signature on 07/26/2017 9:05 PM - E-signed  Electronic signature on 07/26/2017 9:04 PM - E-signed    Resulted by:   Signed Date/Time  Phone Pager Dorothy Spark 07/31/2017 3:44 PM [phone]  External Result Report    External Result Report    Result status: Final result                             *[facility]*                         [address], [place] [address]                            [phone]  ------------------------------------------------------------------- Transesophageal Echocardiography  Patient:    Jared [name], Jared [name] MR #:       [id number] Study Date: 07/31/2017 Gender:     M Age:        27 Height: Weight: BSA: Pt. Status: Room:   Vito Berger, MD  [place], MD  ATTENDING  Berle Mull M  ADMITTING  Fuller Plan A  cc:  ------------------------------------------------------------------- LV EF: 60% -   65%  ------------------------------------------------------------------- Study Conclusions  - Left ventricle: Systolic  function was normal. The estimated   ejection fraction was in the range of 60% to 65%. Wall motion was   normal; there were no regional wall motion abnormalities. - Aorta: There was severe non-mobile atheroma. - Ascending aorta: The ascending aorta was normal in size. - Descending aorta: The descending aorta was normal in size. - Mitral valve: There was mild regurgitation. - Left atrium: No evidence of thrombus in the atrial cavity or   appendage. No evidence of thrombus in the atrial cavity or   appendage. - Right ventricle: Systolic function was normal. - Right atrium: The atrium was dilated. No evidence of thrombus in   the atrial cavity or appendage. - Atrial septum: No defect or patent foramen ovale was identified. - Pericardium, extracardiac: There was no pericardial effusion.  Impressions:  - No cardiac source of emboli was indentified.  ------------------------------------------------------------------- Study data:   Procedure:  Initial setup. The patient was brought to the laboratory. Surface ECG leads were monitored. Sedation. Conscious sedation was administered. Transesophageal echocardiography. Topical anesthesia was obtained using viscous lidocaine. A transesophageal probe  was inserted by the attending cardiologist. Image quality was adequate.  Study completion:  The patient tolerated the procedure well. There were no complications.         Diagnostic transesophageal echocardiography.  2D and color Doppler.  Birthdate:  Patient birthdate: 10-24-1940.  Age:  Patient is 78 yr old.  Sex:  Gender: male.  Study date:  Study date: 07/31/2017. Study time: 03:18 PM.  -------------------------------------------------------------------  ------------------------------------------------------------------- Left ventricle:  Systolic function was normal. The estimated ejection fraction was in the range of 60% to 65%. Wall motion was normal; there were no regional wall motion  abnormalities.  ------------------------------------------------------------------- Aortic valve:   Structurally normal valve. Trileaflet; normal thickness leaflets. Cusp separation was normal.  Doppler:  There was no significant regurgitation.  ------------------------------------------------------------------- Aorta:  There was severe non-mobile atheroma. There was no evidence for dissection. Aortic root: The aortic root was not dilated. Ascending aorta: The ascending aorta was normal in size. Descending aorta: The descending aorta was normal in size.  ------------------------------------------------------------------- Mitral valve:   Structurally normal valve.   Leaflet separation was normal.  Doppler:  There was mild regurgitation.  ------------------------------------------------------------------- Left atrium:  The atrium was normal in size.  No evidence of thrombus in the atrial cavity or appendage.  No evidence of thrombus in the atrial cavity or appendage. The appendage was morphologically a left appendage, multilobulated, and of normal size. Emptying velocity was normal.  ------------------------------------------------------------------- Atrial septum:  No defect or patent foramen ovale was identified.   ------------------------------------------------------------------- Right ventricle:  The cavity size was normal. Wall thickness was normal. Systolic function was normal.  ------------------------------------------------------------------- Pulmonic valve:    Structurally normal valve.  ------------------------------------------------------------------- Tricuspid valve:   Structurally normal valve.   Leaflet separation was normal.  Doppler:  There was mild regurgitation.  ------------------------------------------------------------------- Pulmonary artery:   The main pulmonary artery was  normal-sized.  ------------------------------------------------------------------- Right atrium:  The atrium was dilated.  No evidence of thrombus in the atrial cavity or appendage. The appendage was morphologically a right appendage.  ------------------------------------------------------------------- Pericardium:  There was no pericardial effusion.   ------------------------------------------------------------------- Prepared and Electronically Authenticated by  [name], M.D. 2018-11-12T15:44:16 [employer]   Show images for ECHO TEE Images on [employer]   Show images for [name] Performing Technologist/Nurse   Performing Technologist/Nurse: [name], RDCS Reason for Exam  Priority: Routine  Comments:             Surgical History   Surgical History    No past medical history on file.  Other Surgical History    Procedure Laterality Date Comment Source APPENDECTOMY  1970s  Provider CATARACT EXTRACTION W/ INTRAOCULAR LENS IMPLANT, BILATERAL Bilateral 2014  Provider CHOLECYSTECTOMY  1970s  Provider COLONOSCOPY    Provider CYSTOSCOPY    Provider FOOT SURGERY Left yrs ago  Provider HERNIA REPAIR  2014  Provider LOOP RECORDER INSERTION N/A 07/31/2017 Procedure: LOOP RECORDER INSERTION; Surgeon: Evans Lance, MD; Location: [facility]; Service: Cardiovascular; Laterality: N/A; Provider TEE WITHOUT CARDIOVERSION N/A 07/31/2017 Procedure: TRANSESOPHAGEAL ECHOCARDIOGRAM (TEE) WITH LOOP; Surgeon: Dorothy Spark, MD; Location: [facility] ENDOSCOPY; Service: Cardiovascular; Laterality: N/A; Provider WRIST FRACTURE SURGERY  1970s right x2 unsure if any hardware remain  ;  left x1 with pinning then removed Provider  Implants    Prosthesis/Implant Heart Loop Reveal Linqsys - [id number] - Implanted     Inventory item: LOOP REVEAL LINQSYS Model/Cat number: [id number] Serial  number: [id number] S Manufacturer: [employer] As of 07/31/2017  Status: Implanted    Implant Merm Lnq11 Reveal Linq [id number] - Implanted     Model/Cat number: [id number] REVEAL LINQ Serial number: [id number] Manufacturer: [employer]   As of 01/02/2019     Status: Implanted    Order-Level Documents - 07/26/2017:   Scan on 07/29/2017 1:07 PM by Default, Provider, MD  Scan on 07/27/2017 3:34 PM by Default, Provider, MD    Encounter-Level Documents - 07/26/2017:   Scan on 08/07/2017 3:32 PM by Default, Provider, MD  Scan on 08/01/2017 1:43 PM by Default, Provider, MD  Scan on 08/01/2017 1:25 PM by Default, Provider, MD  Document on 07/31/2017 6:20 PM by Hazle Quant, RN: IP After Visit Summary  Document on 07/31/2017 5:45 AM by Deatra Ina, RN: ED PB Summary  Scan on 07/29/2017 10:37 AM by Default, Provider, MD  Scan on 07/31/2017 9:05 AM by Default, Provider, MD  Scan on 07/31/2017 7:58 PM by Default, Provider, MD  Electronic signature on 07/26/2017 9:05 PM - E-signed  Electronic signature on 07/26/2017 9:04 PM - E-signed    Resulted by:   Signed Date/Time  Phone Pager Dorothy Spark 07/31/2017 3:44 PM [phone]  External Result Report    External Result Report    Carelink summary report received. Battery status OK. Normal device function. No new symptom episodes, tachy episodes, brady, or pause episodes. AF episodes longest duration 36 min and burden 0.6%. Presenting egm: SR w/ ectopy vs AF. On Xarelto. Monthly  summary reports and ROV/PRN    Reproductive/Obstetrics                            Anesthesia Physical Anesthesia Plan  ASA: II  Anesthesia Plan: Spinal   Post-op Pain Management:  Regional for Post-op pain   Induction:   PONV Risk Score and Plan: 1 and Ondansetron  Airway Management Planned: Natural Airway, Nasal Cannula and Simple Face Mask  Additional  Equipment:   Intra-op Plan:   Post-operative Plan:   Informed Consent: I have reviewed the patients History and Physical, chart, labs and discussed the procedure including the risks, benefits and alternatives for the proposed anesthesia with the patient or authorized representative who has indicated his/her understanding and acceptance.       Plan Discussed with: CRNA  Anesthesia Plan Comments: (See PAT note 02/14/2019, Konrad Felix, PA-C)       Anesthesia Quick Evaluation

## 2019-02-19 NOTE — H&P (Signed)
Patient's anticipated LOS is less than 2 midnights, meeting these requirements: - Younger than 3 - Lives within 1 hour of care - Has a competent adult at home to recover with post-op recover - NO history of  - Chronic pain requiring opiods  - Diabetes  - Coronary Artery Disease  - Heart failure  - Heart attack  - Stroke  - DVT/VTE  - Cardiac arrhythmia  - Respiratory Failure/COPD  - Renal failure  - Anemia  - Advanced Liver disease       Jared [name] is an 78 y.o. male.    Chief Complaint: left knee pain  HPI: Pt is a 78 y.o. male complaining of left knee  pain for multiple years. Pain had continually increased since the beginning. X-rays in the clinic show end-stage arthritic changes of the left knee. Pt has tried various conservative treatments which have failed to alleviate their symptoms, including injections and therapy. Various options are discussed with the patient. Risks, benefits and expectations were discussed with the patient. Patient understand the risks, benefits and expectations and wishes to proceed with surgery.   PCP:  [name], MD  D/C Plans: Home  PMH: Past Medical History:  Diagnosis Date  . Allergic rhinitis   . Anticoagulant long-term use    xarelto  . Anxiety   . Benign localized prostatic hyperplasia with lower urinary tract symptoms (LUTS)   . Benzodiazepine dependence ([place])   . Edema of right lower extremity   . GERD (gastroesophageal reflux disease)   . Heart murmur   . Hematuria   . Hepatitis    History of  . History of colonic polyps   . History of CVA (cerebrovascular accident) without residual deficits 07/26/2017   crytogenic stroke -- acute left caudate body infarct   . History of kidney stones   . Hypertension   . Ischemic cardiomyopathy 07/26/2017   ef 35-40%;    TEE 07-31-2017 ef 60-65%  . MDD (major depressive disorder), recurrent, in partial remission ([place])   . OA (osteoarthritis)    knees,   . OSA (obstructive  sleep apnea)    intolerant cpap  . PAF (paroxysmal atrial fibrillation) [facility])    cardiologist-  dr g. taylor  . Skin cancer    Childhood, right foot on toe  . Status post placement of implantable loop recorder 07/31/2017  . Stroke ([place])   . Swelling of right foot 12/2018  . Throat mass   . Wears glasses     PSH: Past Surgical History:  Procedure Laterality Date  . APPENDECTOMY  1970s  . CATARACT EXTRACTION W/ INTRAOCULAR LENS  IMPLANT, BILATERAL Bilateral 2014  . CHOLECYSTECTOMY  1970s  . COLONOSCOPY    . CYSTOSCOPY    . FOOT SURGERY Left yrs ago  . HERNIA REPAIR  2014  . LOOP RECORDER INSERTION N/A 07/31/2017   Procedure: LOOP RECORDER INSERTION;  Surgeon: Evans Lance, MD;  Location: [facility];  Service: Cardiovascular;  Laterality: N/A;  . TEE WITHOUT CARDIOVERSION N/A 07/31/2017   Procedure: TRANSESOPHAGEAL ECHOCARDIOGRAM (TEE) WITH LOOP;  Surgeon: Dorothy Spark, MD;  Location: [facility];  Service: Cardiovascular;  Laterality: N/A;  . WRIST FRACTURE SURGERY  1970s   right x2  unsure if any hardware remain   ;    left x1 with pinning then removed    Social History:  reports that he has never smoked. He has never used smokeless tobacco. He reports that he does not drink alcohol  or use drugs.  Allergies:  Allergies  Allergen Reactions  . Antihistamines, Diphenhydramine-Type     Blood in urine   . Aspirin     Blood in urine   . Codeine Nausea And Vomiting    Must take with food  . Pollen Extract Cough    Medications: No current facility-administered medications for this encounter.    Current Outpatient Medications  Medication Sig Dispense Refill  . acetaminophen (TYLENOL) 325 MG tablet Take 325 mg by mouth every 6 (six) hours as needed for moderate pain or headache.    Marland Kitchen amLODipine (NORVASC) 10 MG tablet Take 1 tablet (10 mg total) by mouth daily. 90 tablet 3  . buPROPion (WELLBUTRIN SR) 150 MG 12 hr tablet TAKE 1 TABLET BY MOUTH TWICE A DAY  (Patient taking differently: Take 150 mg by mouth 2 (two) times daily. ) 180 tablet 3  . carvedilol (COREG) 6.25 MG tablet Take 1 tablet (6.25 mg total) by mouth 2 (two) times daily with a meal. 180 tablet 2  . dutasteride (AVODART) 0.5 MG capsule Take 1 capsule (0.5 mg total) by mouth daily. 90 capsule 3  . Menthol, Topical Analgesic, (ICY HOT EX) Apply 1 application topically daily as needed (pain).    . mirtazapine (REMERON) 15 MG tablet TAKE 1 TABLET BY MOUTH EVERYDAY AT BEDTIME (Patient taking differently: Take 15 mg by mouth at bedtime. ) 30 tablet 5  . Multiple Vitamin (MULTIVITAMIN WITH MINERALS) TABS tablet Take 1 tablet by mouth daily.    Marland Kitchen omeprazole (PRILOSEC) 10 MG capsule TAKE ONE CAPSULE BY MOUTH EVERY DAY (Patient taking differently: Take 10 mg by mouth daily. ) 90 capsule 3  . Propylene Glycol (SYSTANE BALANCE) 0.6 % SOLN Place 1 drop into both eyes daily as needed (dry eyes).    . RESTASIS 0.05 % ophthalmic emulsion Place 1 drop 2 (two) times daily into both eyes.  4  . rivaroxaban (XARELTO) 20 MG TABS tablet Take 1 tablet (20 mg total) by mouth daily with supper. 90 tablet 3  . timolol (TIMOPTIC) 0.5 % ophthalmic solution Place 1 drop into both eyes 2 (two) times daily.   0  . valsartan (DIOVAN) 80 MG tablet Take 1 tablet (80 mg total) by mouth daily. 90 tablet 3  . ALPRAZolam (XANAX) 1 MG tablet TAKE 0.5 TABLETS (0.5 MG TOTAL) BY MOUTH 3 (THREE) TIMES DAILY AS NEEDED FOR ANXIETY. 45 tablet 0  . diclofenac sodium (VOLTAREN) 1 % GEL Apply 4 g topically 4 (four) times daily. (Patient not taking: Reported on 02/08/2019) 1 Tube 1    No results found for this or any previous visit (from the past 48 hour(s)). No results found.  ROS: Pain with rom of the left lower extremity  Physical Exam: Alert and oriented 78 y.o. male in no acute distress Cranial nerves 2-12 intact Cervical spine: full rom with no tenderness, nv intact distally Chest: active breath sounds bilaterally, no  wheeze rhonchi or rales Heart: regular rate and rhythm, no murmur Abd: non tender non distended with active bowel sounds Hip is stable with rom  Left knee with moderate medial and lateral joint line tenderness No rashes or edema Antalgic gait  Assessment/Plan Assessment: left total knee arthroplasty  Plan:  Patient will undergo a left total knee by Dr. Veverly Fells at [facility]. Risks benefits and expectations were discussed with the patient. Patient understand risks, benefits and expectations and wishes to proceed. Preoperative templating of the joint replacement has been completed, documented, and  submitted to the Operating Room personnel in order to optimize intra-operative equipment management.   Merla Riches PA-C, MPAS [facility] is now [employer] [facility] [address] [place], [place], [place] [address] Phone: [phone] [email or web address] Facebook  [employer]

## 2019-02-19 NOTE — Progress Notes (Signed)
Anesthesia Chart Review   Case:  [id number] Date/Time:  02/22/19 1221   Procedure:  TOTAL KNEE ARTHROPLASTY (Left )   Anesthesia type:  Choice   Pre-op diagnosis:  Right knee osteoarthritis   Location:  [facility]   Surgeon:  [name], MD      DISCUSSION: 78 yo never smoker with h/o anxiety, depression, GERD, Afib (on Xarelto), BPH, HTN, CVA 2018, OSA w/o device use, right knee OA scheduled for above procedure 02/22/2019 with Dr. [name].   Per PCP, Dr. Viviana Simpler, OV note 11/11/2018, "Appears to be medically stable.  No obvious contraindications.  Normal EF on echo.  No symptoms of respiratory difficulty, CHF, et.  Discussed quad strengthening for now to prepare for rehab."  Loop recorder placed due to syncope.  Last seen by cardiologist, Dr. [name], 06/19/18.  Started on Xarelto at this visit after loop recorder demonstrated a-fib.  Pt advised to hold Xarelto 2 days prior to procedure.    Pt can proceed with planned procedure barring acute status change.  VS: BP (!) 139/52   Pulse (!) 40   Temp 36.7 C (Oral)   Resp 16   Ht 5\' 10"  (1.778 m)   Wt 92.6 kg   SpO2 98%   BMI 29.29 kg/m   PROVIDERS: [name], MD is PCP   [name], MD is Cardiologist  LABS: Labs reviewed: Acceptable for surgery. (all labs ordered are listed, but only abnormal results are displayed)  Labs Reviewed  SURGICAL PCR SCREEN  CBC     IMAGES:   EKG: 06/19/18 Rate 66 bpm Sinus tachycardia with blocked premature atrial complexes with occasional premature ventricular complexes Left axis deviation  Pulmonary disease pattern  Voltage criteria for left ventricular hypertrophy  CV: Echo 07/31/2017 Study Conclusions  - Left ventricle: Systolic function was normal. The estimated   ejection fraction was in the range of 60% to 65%. Wall motion was   normal; there were no regional wall motion abnormalities. - Aorta: There was severe non-mobile atheroma. -  Ascending aorta: The ascending aorta was normal in size. - Descending aorta: The descending aorta was normal in size. - Mitral valve: There was mild regurgitation. - Left atrium: No evidence of thrombus in the atrial cavity or   appendage. No evidence of thrombus in the atrial cavity or   appendage. - Right ventricle: Systolic function was normal. - Right atrium: The atrium was dilated. No evidence of thrombus in   the atrial cavity or appendage. - Atrial septum: No defect or patent foramen ovale was identified. - Pericardium, extracardiac: There was no pericardial effusion.  Impressions:  - No cardiac source of emboli was indentified. Past Medical History:  Diagnosis Date  . Allergic rhinitis   . Anticoagulant long-term use    xarelto  . Anxiety   . Benign localized prostatic hyperplasia with lower urinary tract symptoms (LUTS)   . Benzodiazepine dependence ([place])   . Edema of right lower extremity   . GERD (gastroesophageal reflux disease)   . Heart murmur   . Hematuria   . Hepatitis    History of  . History of colonic polyps   . History of CVA (cerebrovascular accident) without residual deficits 07/26/2017   crytogenic stroke -- acute left caudate body infarct   . History of kidney stones   . Hypertension   . Ischemic cardiomyopathy 07/26/2017   ef 35-40%;    TEE 07-31-2017 ef 60-65%  . MDD (major  depressive disorder), recurrent, in partial remission ([place])   . OA (osteoarthritis)    knees,   . OSA (obstructive sleep apnea)    intolerant cpap  . PAF (paroxysmal atrial fibrillation) [facility])    cardiologist-  dr g. taylor  . Skin cancer    Childhood, right foot on toe  . Status post placement of implantable loop recorder 07/31/2017  . Stroke ([place])   . Swelling of right foot 12/2018  . Throat mass   . Wears glasses     Past Surgical History:  Procedure Laterality Date  . APPENDECTOMY  1970s  . CATARACT EXTRACTION W/ INTRAOCULAR LENS  IMPLANT, BILATERAL Bilateral  2014  . CHOLECYSTECTOMY  1970s  . COLONOSCOPY    . CYSTOSCOPY    . FOOT SURGERY Left yrs ago  . HERNIA REPAIR  2014  . LOOP RECORDER INSERTION N/A 07/31/2017   Procedure: LOOP RECORDER INSERTION;  Surgeon: Evans Lance, MD;  Location: [place] [facility];  Service: Cardiovascular;  Laterality: N/A;  . TEE WITHOUT CARDIOVERSION N/A 07/31/2017   Procedure: TRANSESOPHAGEAL ECHOCARDIOGRAM (TEE) WITH LOOP;  Surgeon: Dorothy Spark, MD;  Location: [place];  Service: Cardiovascular;  Laterality: N/A;  . WRIST FRACTURE SURGERY  1970s   right x2  unsure if any hardware remain   ;    left x1 with pinning then removed    MEDICATIONS: . acetaminophen (TYLENOL) 325 MG tablet  . ALPRAZolam (XANAX) 1 MG tablet  . amLODipine (NORVASC) 10 MG tablet  . buPROPion (WELLBUTRIN SR) 150 MG 12 hr tablet  . carvedilol (COREG) 6.25 MG tablet  . diclofenac sodium (VOLTAREN) 1 % GEL  . dutasteride (AVODART) 0.5 MG capsule  . Menthol, Topical Analgesic, (ICY HOT EX)  . mirtazapine (REMERON) 15 MG tablet  . Multiple Vitamin (MULTIVITAMIN WITH MINERALS) TABS tablet  . omeprazole (PRILOSEC) 10 MG capsule  . Propylene Glycol (SYSTANE BALANCE) 0.6 % SOLN  . RESTASIS 0.05 % ophthalmic emulsion  . rivaroxaban (XARELTO) 20 MG TABS tablet  . timolol (TIMOPTIC) 0.5 % ophthalmic solution  . valsartan (DIOVAN) 80 MG tablet   No current facility-administered medications for this encounter.     Maia Plan [facility] Pre-Surgical Testing [phone] 02/19/19 1:14 PM

## 2019-02-20 LAB — NOVEL CORONAVIRUS, NAA (HOSP ORDER, SEND-OUT TO REF LAB; TAT 18-24 HRS): SARS-CoV-2, NAA: NOT DETECTED

## 2019-02-21 ENCOUNTER — Telehealth: Payer: Self-pay | Admitting: Internal Medicine

## 2019-02-21 MED ORDER — OMEPRAZOLE 10 MG PO CPDR: 10.0000 mg | DELAYED_RELEASE_CAPSULE | Freq: Every day | ORAL | 3 refills | Status: DC

## 2019-02-21 NOTE — Progress Notes (Signed)
SPOKE W/  _Pt     SCREENING SYMPTOMS OF COVID 19: denies all   COUGH--no  RUNNY NOSE---no   SORE THROAT---no  NASAL CONGESTION--no-  SNEEZING----no  SHORTNESS OF BREATH---no  DIFFICULTY BREATHING---no  TEMP >100.0 -----no  UNEXPLAINED BODY ACHES------no  CHILLS -----no---   HEADACHES -------no--  LOSS OF SMELL/ TASTE -----no---    HAVE YOU OR ANY FAMILY MEMBER TRAVELLED PAST 14 DAYS OUT OF THE   COUNTY--no- STATE----no COUNTRY----no  HAVE YOU OR ANY FAMILY MEMBER BEEN EXPOSED TO ANYONE WITH COVID 19? no

## 2019-02-21 NOTE — Addendum Note (Signed)
Addended by: Pilar Grammes on: 02/21/2019 12:10 PM   Modules accepted: Orders

## 2019-02-21 NOTE — Telephone Encounter (Signed)
Copied from [place] [id number]. Topic: General - Call Back - No Documentation >> Feb 20, 2019  4:55 PM Erick Blinks wrote: Reason for CRM: Prescription refill, OMEPRAZOLE DR 10mg  1 per day for acid reflux. Please advise. Under quarantine, left knee getting replaced this Friday in surgery.  Had this med with his previous PCP, seeking to use med again.

## 2019-02-21 NOTE — Telephone Encounter (Signed)
I know about this--it is on his list Okay to refill for a year

## 2019-02-21 NOTE — Progress Notes (Signed)
Jared [name] made aware to arrive at 9:30AM for surgery on 02/22/2019.  He verbalized understanding.

## 2019-02-21 NOTE — Telephone Encounter (Signed)
Rx sent electronically.  

## 2019-02-22 ENCOUNTER — Inpatient Hospital Stay (HOSPITAL_COMMUNITY): Payer: PPO | Admitting: Physician Assistant

## 2019-02-22 ENCOUNTER — Encounter (HOSPITAL_COMMUNITY)
Admission: RE | Disposition: A | Discharge status: Skilled Nursing Facility | Payer: PPO | Source: Other Acute Inpatient Hospital | Attending: Orthopedic Surgery

## 2019-02-22 ENCOUNTER — Inpatient Hospital Stay (HOSPITAL_COMMUNITY): Payer: PPO | Admitting: Certified Registered Nurse Anesthetist

## 2019-02-22 ENCOUNTER — Inpatient Hospital Stay (HOSPITAL_COMMUNITY)
Admission: RE | Admit: 2019-02-22 | Discharge: 2019-02-25 | DRG: 470 | Disposition: A | Discharge status: Skilled Nursing Facility | Payer: PPO | Source: Other Acute Inpatient Hospital | Attending: Orthopedic Surgery | Admitting: Orthopedic Surgery

## 2019-02-22 ENCOUNTER — Other Ambulatory Visit: Payer: Self-pay

## 2019-02-22 ENCOUNTER — Encounter (HOSPITAL_COMMUNITY): Payer: Self-pay | Admitting: Certified Registered Nurse Anesthetist

## 2019-02-22 DIAGNOSIS — F1320 Sedative, hypnotic or anxiolytic dependence, uncomplicated: Secondary | ICD-10-CM | POA: Diagnosis present

## 2019-02-22 DIAGNOSIS — Z79899 Other long term (current) drug therapy: Secondary | ICD-10-CM

## 2019-02-22 DIAGNOSIS — Z8673 Personal history of transient ischemic attack (TIA), and cerebral infarction without residual deficits: Secondary | ICD-10-CM | POA: Diagnosis not present

## 2019-02-22 DIAGNOSIS — Z885 Allergy status to narcotic agent status: Secondary | ICD-10-CM

## 2019-02-22 DIAGNOSIS — Z888 Allergy status to other drugs, medicaments and biological substances status: Secondary | ICD-10-CM | POA: Diagnosis not present

## 2019-02-22 DIAGNOSIS — G4733 Obstructive sleep apnea (adult) (pediatric): Secondary | ICD-10-CM | POA: Diagnosis present

## 2019-02-22 DIAGNOSIS — Z6829 Body mass index (BMI) 29.0-29.9, adult: Secondary | ICD-10-CM | POA: Diagnosis not present

## 2019-02-22 DIAGNOSIS — Z9841 Cataract extraction status, right eye: Secondary | ICD-10-CM | POA: Diagnosis not present

## 2019-02-22 DIAGNOSIS — Z91048 Other nonmedicinal substance allergy status: Secondary | ICD-10-CM

## 2019-02-22 DIAGNOSIS — F3341 Major depressive disorder, recurrent, in partial remission: Secondary | ICD-10-CM | POA: Diagnosis present

## 2019-02-22 DIAGNOSIS — K219 Gastro-esophageal reflux disease without esophagitis: Secondary | ICD-10-CM | POA: Diagnosis present

## 2019-02-22 DIAGNOSIS — I480 Paroxysmal atrial fibrillation: Secondary | ICD-10-CM | POA: Diagnosis not present

## 2019-02-22 DIAGNOSIS — Z7401 Bed confinement status: Secondary | ICD-10-CM | POA: Diagnosis not present

## 2019-02-22 DIAGNOSIS — M1712 Unilateral primary osteoarthritis, left knee: Secondary | ICD-10-CM | POA: Diagnosis not present

## 2019-02-22 DIAGNOSIS — G8918 Other acute postprocedural pain: Secondary | ICD-10-CM | POA: Diagnosis not present

## 2019-02-22 DIAGNOSIS — N400 Enlarged prostate without lower urinary tract symptoms: Secondary | ICD-10-CM | POA: Diagnosis not present

## 2019-02-22 DIAGNOSIS — Z9842 Cataract extraction status, left eye: Secondary | ICD-10-CM | POA: Diagnosis not present

## 2019-02-22 DIAGNOSIS — F419 Anxiety disorder, unspecified: Secondary | ICD-10-CM | POA: Diagnosis present

## 2019-02-22 DIAGNOSIS — Z7901 Long term (current) use of anticoagulants: Secondary | ICD-10-CM | POA: Diagnosis not present

## 2019-02-22 DIAGNOSIS — J309 Allergic rhinitis, unspecified: Secondary | ICD-10-CM | POA: Diagnosis not present

## 2019-02-22 DIAGNOSIS — R52 Pain, unspecified: Secondary | ICD-10-CM | POA: Diagnosis not present

## 2019-02-22 DIAGNOSIS — I10 Essential (primary) hypertension: Secondary | ICD-10-CM | POA: Diagnosis not present

## 2019-02-22 DIAGNOSIS — Z973 Presence of spectacles and contact lenses: Secondary | ICD-10-CM | POA: Diagnosis not present

## 2019-02-22 DIAGNOSIS — Z961 Presence of intraocular lens: Secondary | ICD-10-CM | POA: Diagnosis present

## 2019-02-22 DIAGNOSIS — M25562 Pain in left knee: Secondary | ICD-10-CM | POA: Diagnosis not present

## 2019-02-22 DIAGNOSIS — E669 Obesity, unspecified: Secondary | ICD-10-CM | POA: Diagnosis not present

## 2019-02-22 DIAGNOSIS — Z886 Allergy status to analgesic agent status: Secondary | ICD-10-CM | POA: Diagnosis not present

## 2019-02-22 DIAGNOSIS — I4891 Unspecified atrial fibrillation: Secondary | ICD-10-CM | POA: Diagnosis not present

## 2019-02-22 DIAGNOSIS — M2550 Pain in unspecified joint: Secondary | ICD-10-CM | POA: Diagnosis not present

## 2019-02-22 DIAGNOSIS — I255 Ischemic cardiomyopathy: Secondary | ICD-10-CM | POA: Diagnosis not present

## 2019-02-22 DIAGNOSIS — Z96652 Presence of left artificial knee joint: Secondary | ICD-10-CM

## 2019-02-22 DIAGNOSIS — M170 Bilateral primary osteoarthritis of knee: Secondary | ICD-10-CM | POA: Diagnosis not present

## 2019-02-22 HISTORY — PX: TOTAL KNEE ARTHROPLASTY: SHX125

## 2019-02-22 SURGERY — ARTHROPLASTY, KNEE, TOTAL
Anesthesia: Spinal | Site: Knee | Laterality: Left

## 2019-02-22 MED ORDER — 0.9 % SODIUM CHLORIDE (POUR BTL) OPTIME
TOPICAL | Status: DC | PRN
  Administered 2019-02-22: 1000 mL

## 2019-02-22 MED ORDER — PANTOPRAZOLE SODIUM 40 MG PO TBEC
40.0000 mg | DELAYED_RELEASE_TABLET | Freq: Every day | ORAL | Status: DC
  Administered 2019-02-23 – 2019-02-25 (×3): 40 mg via ORAL
  Filled 2019-02-22 (×3): qty 1

## 2019-02-22 MED ORDER — PROPYLENE GLYCOL 0.6 % OP SOLN: 1.0000 [drp] | Freq: Every day | OPHTHALMIC | Status: DC | PRN

## 2019-02-22 MED ORDER — ACETAMINOPHEN 10 MG/ML IV SOLN: 1000.0000 mg | Freq: Once | INTRAVENOUS | Status: DC | PRN

## 2019-02-22 MED ORDER — METOCLOPRAMIDE HCL 5 MG/ML IJ SOLN: 5.0000 mg | Freq: Three times a day (TID) | INTRAMUSCULAR | Status: DC | PRN

## 2019-02-22 MED ORDER — LACTATED RINGERS IV SOLN
INTRAVENOUS | Status: DC
  Administered 2019-02-22: 09:00:00 via INTRAVENOUS

## 2019-02-22 MED ORDER — METOCLOPRAMIDE HCL 5 MG PO TABS: 5.0000 mg | ORAL_TABLET | Freq: Three times a day (TID) | ORAL | Status: DC | PRN

## 2019-02-22 MED ORDER — FENTANYL CITRATE (PF) 100 MCG/2ML IJ SOLN
25.0000 ug | INTRAMUSCULAR | Status: DC | PRN
  Administered 2019-02-22: 50 ug via INTRAVENOUS

## 2019-02-22 MED ORDER — BUPROPION HCL ER (SR) 150 MG PO TB12
150.0000 mg | ORAL_TABLET | Freq: Two times a day (BID) | ORAL | Status: DC
  Administered 2019-02-22 – 2019-02-25 (×6): 150 mg via ORAL
  Filled 2019-02-22 (×6): qty 1

## 2019-02-22 MED ORDER — DUTASTERIDE 0.5 MG PO CAPS
0.5000 mg | ORAL_CAPSULE | Freq: Every day | ORAL | Status: DC
  Administered 2019-02-23 – 2019-02-25 (×3): 0.5 mg via ORAL
  Filled 2019-02-22 (×4): qty 1

## 2019-02-22 MED ORDER — RIVAROXABAN 10 MG PO TABS
20.0000 mg | ORAL_TABLET | Freq: Every day | ORAL | Status: DC
  Administered 2019-02-23 – 2019-02-25 (×3): 20 mg via ORAL
  Filled 2019-02-22 (×3): qty 2

## 2019-02-22 MED ORDER — MIRTAZAPINE 15 MG PO TABS
15.0000 mg | ORAL_TABLET | Freq: Every day | ORAL | Status: DC
  Administered 2019-02-22 – 2019-02-24 (×3): 15 mg via ORAL
  Filled 2019-02-22 (×3): qty 1

## 2019-02-22 MED ORDER — IRBESARTAN 75 MG PO TABS
75.0000 mg | ORAL_TABLET | Freq: Every day | ORAL | Status: DC
  Administered 2019-02-22 – 2019-02-25 (×4): 75 mg via ORAL
  Filled 2019-02-22 (×4): qty 1

## 2019-02-22 MED ORDER — METHOCARBAMOL 500 MG PO TABS: 500.0000 mg | ORAL_TABLET | Freq: Three times a day (TID) | ORAL | 1 refills | Status: DC | PRN

## 2019-02-22 MED ORDER — LACTATED RINGERS IV SOLN
INTRAVENOUS | Status: DC | PRN
  Administered 2019-02-22: 10:00:00 via INTRAVENOUS

## 2019-02-22 MED ORDER — OXYCODONE-ACETAMINOPHEN 5-325 MG PO TABS: 1.0000 | ORAL_TABLET | ORAL | 0 refills | Status: DC | PRN

## 2019-02-22 MED ORDER — PHENOL 1.4 % MT LIQD
1.0000 | OROMUCOSAL | Status: DC | PRN
  Filled 2019-02-22: qty 177

## 2019-02-22 MED ORDER — OXYCODONE HCL 5 MG PO TABS
5.0000 mg | ORAL_TABLET | ORAL | Status: DC | PRN
  Administered 2019-02-22: 10 mg via ORAL
  Administered 2019-02-23: 5 mg via ORAL
  Administered 2019-02-23 – 2019-02-24 (×4): 10 mg via ORAL
  Administered 2019-02-25: 5 mg via ORAL
  Filled 2019-02-22: qty 1
  Filled 2019-02-22 (×6): qty 2
  Filled 2019-02-22: qty 1

## 2019-02-22 MED ORDER — DEXAMETHASONE SODIUM PHOSPHATE 10 MG/ML IJ SOLN
INTRAMUSCULAR | Status: AC
  Filled 2019-02-22: qty 1

## 2019-02-22 MED ORDER — HYDROMORPHONE HCL 1 MG/ML IJ SOLN
0.5000 mg | INTRAMUSCULAR | Status: DC | PRN
  Administered 2019-02-23: 1 mg via INTRAVENOUS
  Administered 2019-02-24: 0.5 mg via INTRAVENOUS
  Filled 2019-02-22 (×2): qty 1

## 2019-02-22 MED ORDER — ONDANSETRON HCL 4 MG/2ML IJ SOLN
INTRAMUSCULAR | Status: AC
  Filled 2019-02-22: qty 2

## 2019-02-22 MED ORDER — ONDANSETRON HCL 4 MG/2ML IJ SOLN
4.0000 mg | Freq: Four times a day (QID) | INTRAMUSCULAR | Status: DC | PRN
  Administered 2019-02-23: 4 mg via INTRAVENOUS
  Filled 2019-02-22: qty 2

## 2019-02-22 MED ORDER — MIDAZOLAM HCL 2 MG/2ML IJ SOLN
INTRAMUSCULAR | Status: AC
  Filled 2019-02-22: qty 2

## 2019-02-22 MED ORDER — DOCUSATE SODIUM 100 MG PO CAPS
100.0000 mg | ORAL_CAPSULE | Freq: Two times a day (BID) | ORAL | Status: DC
  Administered 2019-02-22 – 2019-02-25 (×5): 100 mg via ORAL
  Filled 2019-02-22 (×5): qty 1

## 2019-02-22 MED ORDER — TRANEXAMIC ACID-NACL 1000-0.7 MG/100ML-% IV SOLN
1000.0000 mg | Freq: Once | INTRAVENOUS | Status: AC
  Administered 2019-02-22: 1000 mg via INTRAVENOUS
  Filled 2019-02-22: qty 100

## 2019-02-22 MED ORDER — ACETAMINOPHEN 325 MG PO TABS: 325.0000 mg | ORAL_TABLET | Freq: Four times a day (QID) | ORAL | Status: DC | PRN

## 2019-02-22 MED ORDER — CEFAZOLIN SODIUM-DEXTROSE 2-4 GM/100ML-% IV SOLN
2.0000 g | Freq: Four times a day (QID) | INTRAVENOUS | Status: AC
  Administered 2019-02-22 (×2): 2 g via INTRAVENOUS
  Filled 2019-02-22 (×2): qty 100

## 2019-02-22 MED ORDER — PROPOFOL 500 MG/50ML IV EMUL
INTRAVENOUS | Status: DC | PRN
  Administered 2019-02-22: 50 ug/kg/min via INTRAVENOUS

## 2019-02-22 MED ORDER — METHOCARBAMOL 500 MG PO TABS
500.0000 mg | ORAL_TABLET | Freq: Four times a day (QID) | ORAL | Status: DC | PRN
  Administered 2019-02-22 – 2019-02-24 (×6): 500 mg via ORAL
  Filled 2019-02-22 (×6): qty 1

## 2019-02-22 MED ORDER — ALPRAZOLAM 0.5 MG PO TABS
0.5000 mg | ORAL_TABLET | Freq: Three times a day (TID) | ORAL | Status: DC | PRN
  Administered 2019-02-23 – 2019-02-25 (×5): 0.5 mg via ORAL
  Filled 2019-02-22 (×5): qty 1

## 2019-02-22 MED ORDER — METHOCARBAMOL 500 MG IVPB - SIMPLE MED
500.0000 mg | Freq: Four times a day (QID) | INTRAVENOUS | Status: DC | PRN
  Administered 2019-02-22: 500 mg via INTRAVENOUS
  Filled 2019-02-22: qty 50

## 2019-02-22 MED ORDER — PROPOFOL 10 MG/ML IV BOLUS
INTRAVENOUS | Status: DC | PRN
  Administered 2019-02-22: 40 mg via INTRAVENOUS

## 2019-02-22 MED ORDER — PROPOFOL 10 MG/ML IV BOLUS
INTRAVENOUS | Status: AC
  Filled 2019-02-22: qty 60

## 2019-02-22 MED ORDER — ONDANSETRON HCL 4 MG PO TABS: 4.0000 mg | ORAL_TABLET | Freq: Four times a day (QID) | ORAL | Status: DC | PRN

## 2019-02-22 MED ORDER — PROMETHAZINE HCL 25 MG/ML IJ SOLN: 6.2500 mg | INTRAMUSCULAR | Status: DC | PRN

## 2019-02-22 MED ORDER — TRANEXAMIC ACID-NACL 1000-0.7 MG/100ML-% IV SOLN
1000.0000 mg | INTRAVENOUS | Status: AC
  Administered 2019-02-22: 1000 mg via INTRAVENOUS
  Filled 2019-02-22: qty 100

## 2019-02-22 MED ORDER — FENTANYL CITRATE (PF) 100 MCG/2ML IJ SOLN
INTRAMUSCULAR | Status: AC
  Filled 2019-02-22: qty 2

## 2019-02-22 MED ORDER — AMLODIPINE BESYLATE 10 MG PO TABS
10.0000 mg | ORAL_TABLET | Freq: Every day | ORAL | Status: DC
  Administered 2019-02-23 – 2019-02-25 (×3): 10 mg via ORAL
  Filled 2019-02-22 (×3): qty 1

## 2019-02-22 MED ORDER — SODIUM CHLORIDE 0.9 % IV SOLN
INTRAVENOUS | Status: DC
  Administered 2019-02-22: 15:00:00 via INTRAVENOUS

## 2019-02-22 MED ORDER — POLYETHYLENE GLYCOL 3350 17 G PO PACK: 17.0000 g | PACK | Freq: Every day | ORAL | Status: DC | PRN

## 2019-02-22 MED ORDER — BUPIVACAINE IN DEXTROSE 0.75-8.25 % IT SOLN
INTRATHECAL | Status: DC | PRN
  Administered 2019-02-22: 1.6 mL via INTRATHECAL

## 2019-02-22 MED ORDER — SODIUM CHLORIDE 0.9 % IR SOLN
Status: DC | PRN
  Administered 2019-02-22: 1000 mL

## 2019-02-22 MED ORDER — POLYVINYL ALCOHOL 1.4 % OP SOLN
1.0000 [drp] | Freq: Every day | OPHTHALMIC | Status: DC | PRN
  Filled 2019-02-22: qty 15

## 2019-02-22 MED ORDER — BISACODYL 10 MG RE SUPP: 10.0000 mg | Freq: Every day | RECTAL | Status: DC | PRN

## 2019-02-22 MED ORDER — CYCLOSPORINE 0.05 % OP EMUL
1.0000 [drp] | Freq: Two times a day (BID) | OPHTHALMIC | Status: DC
  Administered 2019-02-22 – 2019-02-25 (×6): 1 [drp] via OPHTHALMIC
  Filled 2019-02-22 (×6): qty 30

## 2019-02-22 MED ORDER — BUPIVACAINE-EPINEPHRINE (PF) 0.5% -1:200000 IJ SOLN
INTRAMUSCULAR | Status: DC | PRN
  Administered 2019-02-22 (×2): 5 mL via PERINEURAL

## 2019-02-22 MED ORDER — CEFAZOLIN SODIUM-DEXTROSE 2-4 GM/100ML-% IV SOLN
2.0000 g | INTRAVENOUS | Status: AC
  Administered 2019-02-22: 2 g via INTRAVENOUS
  Filled 2019-02-22: qty 100

## 2019-02-22 MED ORDER — DEXAMETHASONE SODIUM PHOSPHATE 10 MG/ML IJ SOLN
INTRAMUSCULAR | Status: DC | PRN
  Administered 2019-02-22: 10 mg via INTRAVENOUS

## 2019-02-22 MED ORDER — FERROUS SULFATE 325 (65 FE) MG PO TABS
325.0000 mg | ORAL_TABLET | Freq: Three times a day (TID) | ORAL | Status: DC
  Administered 2019-02-23 – 2019-02-25 (×8): 325 mg via ORAL
  Filled 2019-02-22 (×8): qty 1

## 2019-02-22 MED ORDER — CHLORHEXIDINE GLUCONATE 4 % EX LIQD: 60.0000 mL | Freq: Once | CUTANEOUS | Status: DC

## 2019-02-22 MED ORDER — ROPIVACAINE HCL 7.5 MG/ML IJ SOLN
INTRAMUSCULAR | Status: DC | PRN
  Administered 2019-02-22 (×4): 5 mL via PERINEURAL

## 2019-02-22 MED ORDER — MENTHOL 3 MG MT LOZG: 1.0000 | LOZENGE | OROMUCOSAL | Status: DC | PRN

## 2019-02-22 MED ORDER — CARVEDILOL 6.25 MG PO TABS
6.2500 mg | ORAL_TABLET | Freq: Two times a day (BID) | ORAL | Status: DC
  Administered 2019-02-22 – 2019-02-25 (×7): 6.25 mg via ORAL
  Filled 2019-02-22 (×7): qty 1

## 2019-02-22 MED ORDER — FENTANYL CITRATE (PF) 100 MCG/2ML IJ SOLN
INTRAMUSCULAR | Status: AC
  Administered 2019-02-22: 100 ug
  Filled 2019-02-22: qty 2

## 2019-02-22 MED ORDER — ACETAMINOPHEN 325 MG PO TABS
325.0000 mg | ORAL_TABLET | Freq: Four times a day (QID) | ORAL | Status: DC | PRN
  Administered 2019-02-23: 325 mg via ORAL
  Administered 2019-02-23 – 2019-02-25 (×5): 650 mg via ORAL
  Filled 2019-02-22 (×6): qty 2

## 2019-02-22 MED ORDER — TIMOLOL MALEATE 0.5 % OP SOLN
1.0000 [drp] | Freq: Two times a day (BID) | OPHTHALMIC | Status: DC
  Administered 2019-02-22 – 2019-02-25 (×6): 1 [drp] via OPHTHALMIC
  Filled 2019-02-22: qty 5

## 2019-02-22 MED ORDER — MEPERIDINE HCL 50 MG/ML IJ SOLN: 6.2500 mg | INTRAMUSCULAR | Status: DC | PRN

## 2019-02-22 MED ORDER — ONDANSETRON HCL 4 MG/2ML IJ SOLN
INTRAMUSCULAR | Status: DC | PRN
  Administered 2019-02-22: 4 mg via INTRAVENOUS

## 2019-02-22 MED ORDER — METHOCARBAMOL 500 MG IVPB - SIMPLE MED
INTRAVENOUS | Status: AC
  Filled 2019-02-22: qty 50

## 2019-02-22 MED ORDER — ADULT MULTIVITAMIN W/MINERALS CH
1.0000 | ORAL_TABLET | Freq: Every day | ORAL | Status: DC
  Administered 2019-02-23 – 2019-02-25 (×3): 1 via ORAL
  Filled 2019-02-22 (×3): qty 1

## 2019-02-22 SURGICAL SUPPLY — 56 items
BAG SPEC THK2 15X12 ZIP CLS (MISCELLANEOUS)
BAG ZIPLOCK 12X15 (MISCELLANEOUS) ×1 IMPLANT
BIT DRILL 1.6MX128 (BIT) ×1 IMPLANT
BIT DRILL 1.6MX128MM (BIT) ×1
BLADE SAG 18X100X1.27 (BLADE) ×3 IMPLANT
BLADE SAW SGTL 13X75X1.27 (BLADE) ×3 IMPLANT
BNDG CMPR MED 10X6 ELC LF (GAUZE/BANDAGES/DRESSINGS) ×1
BNDG ELASTIC 6X10 VLCR STRL LF (GAUZE/BANDAGES/DRESSINGS) ×3 IMPLANT
BNDG GAUZE ELAST 4 BULKY (GAUZE/BANDAGES/DRESSINGS) ×3 IMPLANT
BOWL SMART MIX CTS (DISPOSABLE) ×3 IMPLANT
CEMENT HV SMART SET (Cement) ×6 IMPLANT
CEMENT TIBIA MBT SIZE 5 (Knees) IMPLANT
CLOSURE WOUND 1/2 X4 (GAUZE/BANDAGES/DRESSINGS) ×1
COVER SURGICAL LIGHT HANDLE (MISCELLANEOUS) ×3 IMPLANT
COVER WAND RF STERILE (DRAPES) IMPLANT
CUFF TOURN SGL QUICK 34 (TOURNIQUET CUFF) ×3
CUFF TRNQT CYL 34X4.125X (TOURNIQUET CUFF) ×1 IMPLANT
DRAPE SHEET LG 3/4 BI-LAMINATE (DRAPES) ×3 IMPLANT
DRAPE U-SHAPE 47X51 STRL (DRAPES) ×3 IMPLANT
DRSG ADAPTIC 3X8 NADH LF (GAUZE/BANDAGES/DRESSINGS) ×3 IMPLANT
DRSG PAD ABDOMINAL 8X10 ST (GAUZE/BANDAGES/DRESSINGS) ×3 IMPLANT
DURAPREP 26ML APPLICATOR (WOUND CARE) ×3 IMPLANT
ELECT REM PT RETURN 15FT ADLT (MISCELLANEOUS) ×3 IMPLANT
FEMUR SIGMA PS SZ 5.0 L (Femur) ×2 IMPLANT
GAUZE SPONGE 4X4 12PLY STRL (GAUZE/BANDAGES/DRESSINGS) ×3 IMPLANT
GLOVE BIOGEL PI ORTHO PRO 7.5 (GLOVE) ×2
GLOVE BIOGEL PI ORTHO PRO SZ8 (GLOVE) ×2
GLOVE ORTHO TXT STRL SZ7.5 (GLOVE) ×3 IMPLANT
GLOVE PI ORTHO PRO STRL 7.5 (GLOVE) ×1 IMPLANT
GLOVE PI ORTHO PRO STRL SZ8 (GLOVE) ×1 IMPLANT
GLOVE SURG ORTHO 8.5 STRL (GLOVE) ×6 IMPLANT
GOWN STRL REIN XL XLG (GOWN DISPOSABLE) ×6 IMPLANT
HANDPIECE INTERPULSE COAX TIP (DISPOSABLE) ×3
HOLDER FOLEY CATH W/STRAP (MISCELLANEOUS) ×2 IMPLANT
IMMOBILIZER KNEE 20 (SOFTGOODS) ×3
IMMOBILIZER KNEE 20 THIGH 36 (SOFTGOODS) IMPLANT
KIT TURNOVER KIT A (KITS) IMPLANT
MANIFOLD NEPTUNE II (INSTRUMENTS) ×3 IMPLANT
PACK TOTAL KNEE CUSTOM (KITS) ×3 IMPLANT
PATELLA DOME PFC 41MM (Knees) ×2 IMPLANT
PIN STEINMAN FIXATION KNEE (PIN) ×2 IMPLANT
PLATE ROT INSERT 12.5MM (Plate) ×2 IMPLANT
PROTECTOR NERVE ULNAR (MISCELLANEOUS) ×3 IMPLANT
SET HNDPC FAN SPRY TIP SCT (DISPOSABLE) ×1 IMPLANT
STAPLER VISISTAT 35W (STAPLE) IMPLANT
STRIP CLOSURE SKIN 1/2X4 (GAUZE/BANDAGES/DRESSINGS) ×3 IMPLANT
SUT MNCRL AB 3-0 PS2 18 (SUTURE) ×3 IMPLANT
SUT VIC AB 0 CT1 36 (SUTURE) ×3 IMPLANT
SUT VIC AB 1 CT1 36 (SUTURE) ×9 IMPLANT
SUT VIC AB 2-0 CT1 27 (SUTURE) ×3
SUT VIC AB 2-0 CT1 TAPERPNT 27 (SUTURE) ×1 IMPLANT
TIBIA MBT CEMENT SIZE 5 (Knees) ×3 IMPLANT
TRAY FOLEY MTR SLVR 16FR STAT (SET/KITS/TRAYS/PACK) ×3 IMPLANT
WATER STERILE IRR 1000ML POUR (IV SOLUTION) ×6 IMPLANT
WRAP KNEE MAXI GEL POST OP (GAUZE/BANDAGES/DRESSINGS) ×2 IMPLANT
YANKAUER SUCT BULB TIP 10FT TU (MISCELLANEOUS) ×3 IMPLANT

## 2019-02-22 NOTE — Interval H&P Note (Deleted)
History and Physical Interval Note:  02/22/2019 9:18 AM  [name]  has presented today for surgery, with the diagnosis of Right knee osteoarthritis.  The various methods of treatment have been discussed with the patient and family. After consideration of risks, benefits and other options for treatment, the patient has consented to  Procedure(s): TOTAL KNEE ARTHROPLASTY (Left) as a surgical intervention.  The patient's history has been reviewed, patient examined, no change in status, stable for surgery.  I have reviewed the patient's chart and labs.  Questions were answered to the patient's satisfaction.     Jared [name]

## 2019-02-22 NOTE — Evaluation (Signed)
Physical Therapy Evaluation Patient Details Name: Jared [name] MRN: [id number] DOB: 26-Aug-1941 Today's Date: 02/22/2019   History of Present Illness  78 yo male s/p L TKR on 02/22/19. PMH includes anxiety, benzodiazepine dependence, GERD, heart murmur, CVA without residual deficits, R and L wrist fracture, HTN, ischemic cardiomyopathy, MDD, OSA.   Clinical Impression   Pt presents with L knee pain, decreased L knee ROM, generalized LE weakness, difficulty performing mobility tasks, unsteadiness in standing, and decreased activity tolerance due to LLE pain. Pt to benefit from acute PT to address deficits. Pt ambulated hallway distance with RW with min guard assist, verbal cuing for form and safety provided. Pt lives alone and does not have assist at d/c, pt expressing concerns about d/c home. PT recommending SNF vs HHPT, pending pt progress and safety with mobility. Pt educated on ankle pumps (20x/hour) to perform this evening to improve circulation. PT to progress mobility as tolerated, and will continue to follow acutely.      Follow Up Recommendations SNF;Home health PT(SNF vs HHPT, pending pt progress and social support to assist pt upon d/c)    Equipment Recommendations  Rolling walker with 5" wheels;3in1 (PT)    Recommendations for Other Services       Precautions / Restrictions Precautions Precautions: Fall Required Braces or Orthoses: Knee Immobilizer - Left Knee Immobilizer - Left: Other (comment)(Per orders, on when in bed when not in CPM) Restrictions Weight Bearing Restrictions: No Other Position/Activity Restrictions: WBAT      Mobility  Bed Mobility Overal bed mobility: Needs Assistance Bed Mobility: Supine to Sit     Supine to sit: Min assist;HOB elevated     General bed mobility comments: Min assist for LLE management, scooting to EOB. Increased time and effort, use of bedrails.   Transfers Overall transfer level: Needs assistance Equipment used: Rolling walker  (2 wheeled) Transfers: Sit to/from Stand Sit to Stand: Min assist;From elevated surface         General transfer comment: Min assist for power up, steadying. Pt initially standing on heels with posterior leaning, increased time to find balance. Pt able to weight shift L and R without LLE buckling.  Ambulation/Gait Ambulation/Gait assistance: Min guard;+2 safety/equipment(chair follow) Gait Distance (Feet): 70 Feet Assistive device: Rolling walker (2 wheeled) Gait Pattern/deviations: Step-to pattern;Decreased step length - right;Decreased step length - left;Decreased weight shift to left;Trunk flexed Gait velocity: decr    General Gait Details: Min guard for safety, close guarding of LLE initially but no buckling or instability noted. Verbal cuing for upright posture, sequencing, placement in RW, and upright posture x3.   Stairs            Wheelchair Mobility    Modified Rankin (Stroke Patients Only)       Balance Overall balance assessment: Needs assistance;History of Falls Sitting-balance support: No upper extremity supported;Feet supported Sitting balance-Leahy Scale: Fair     Standing balance support: Bilateral upper extremity supported Standing balance-Leahy Scale: Poor Standing balance comment: reliant on RW for stability in standing                              Pertinent Vitals/Pain Pain Assessment: 0-10 Pain Score: 6  Pain Location: L knee Pain Descriptors / Indicators: Sore Pain Intervention(s): Monitored during session;Repositioned;Limited activity within patient's tolerance;Ice applied;Premedicated before session    [place] expects to be discharged to:: Private residence Living Arrangements: Alone Available Help at Discharge: Other (Comment)(Pt  states he has no family or friends that can assist him when he d/c's) Type of Home: House Home Access: Stairs to enter Entrance Stairs-Rails: None Entrance Stairs-Number of  Steps: 3 Home Layout: One level Home Equipment: Cane - single point;Cane - quad      Prior Function Level of Independence: Independent               Hand Dominance   Dominant Hand: Right    Extremity/Trunk Assessment   Upper Extremity Assessment Upper Extremity Assessment: Overall WFL for tasks assessed    Lower Extremity Assessment Lower Extremity Assessment: LLE deficits/detail;Generalized weakness LLE Deficits / Details: suspected post-surgical weakness; able to perform ankle pumps, quad set, heel slide to 50*, SLR with 10* quad lag without lift assist x2 LLE Sensation: WNL    Cervical / Trunk Assessment Cervical / Trunk Assessment: Normal  Communication   Communication: No difficulties  Cognition Arousal/Alertness: Awake/alert Behavior During Therapy: WFL for tasks assessed/performed Overall Cognitive Status: Within Functional Limits for tasks assessed                                        General Comments      Exercises     Assessment/Plan    PT Assessment Patient needs continued PT services  PT Problem List Decreased mobility;Decreased safety awareness;Decreased strength;Decreased range of motion;Decreased activity tolerance;Decreased balance;Decreased knowledge of use of DME;Pain       PT Treatment Interventions DME instruction;Functional mobility training;Balance training;Patient/family education;Therapeutic activities;Stair training;Therapeutic exercise;Gait training    PT Goals (Current goals can be found in the Care Plan section)  Acute Rehab PT Goals Patient Stated Goal: none stated  PT Goal Formulation: With patient Time For Goal Achievement: 03/01/19 Potential to Achieve Goals: Good    Frequency 7X/week   Barriers to discharge Decreased caregiver support doesn't have family/friends that will be able to help him at home after d/c from hospital     Co-evaluation               AM-PAC PT "6 Clicks" Mobility   Outcome Measure Help needed turning from your back to your side while in a flat bed without using bedrails?: A Little Help needed moving from lying on your back to sitting on the side of a flat bed without using bedrails?: A Little Help needed moving to and from a bed to a chair (including a wheelchair)?: A Little Help needed standing up from a chair using your arms (e.g., wheelchair or bedside chair)?: A Little Help needed to walk in hospital room?: A Little Help needed climbing 3-5 steps with a railing? : A Lot 6 Click Score: 17    End of Session Equipment Utilized During Treatment: Gait belt;Left knee immobilizer(KI applied in sitting post-ambulation, per orders) Activity Tolerance: Patient tolerated treatment well;Patient limited by fatigue;Patient limited by pain Patient left: in chair;with chair alarm set;with call bell/phone within reach;with SCD's reapplied Nurse Communication: Mobility status PT Visit Diagnosis: Other abnormalities of gait and mobility (R26.89);Difficulty in walking, not elsewhere classified (R26.2)    Time: 4270-6237 PT Time Calculation (min) (ACUTE ONLY): 30 min   Charges:   PT Evaluation $PT Eval Low Complexity: 1 Low PT Treatments $Gait Training: 8-22 mins       Julien Girt, PT Acute Rehabilitation Services Pager [phone]  Office [phone]   Roxine Caddy D Elonda Husky 02/22/2019, 6:23 PM

## 2019-02-22 NOTE — Anesthesia Postprocedure Evaluation (Signed)
Anesthesia Post Note  Patient: Jared [name]  Procedure(s) Performed: TOTAL KNEE ARTHROPLASTY (Left Knee)     Patient location during evaluation: PACU Anesthesia Type: Spinal Level of consciousness: awake Pain management: pain level controlled Vital Signs Assessment: post-procedure vital signs reviewed and stable Respiratory status: spontaneous breathing Cardiovascular status: stable Postop Assessment: no headache, no backache, spinal receding, patient able to bend at knees and no apparent nausea or vomiting Anesthetic complications: no    Last Vitals:  Vitals:   02/22/19 0935 02/22/19 1231  BP: 122/71 138/88  Pulse: 62 74  Resp: 14 (!) 23  Temp:  36.6 C  SpO2: 98% 98%    Last Pain:  Vitals:   02/22/19 1330  TempSrc:   PainSc: Asleep   Pain Goal: Patients Stated Pain Goal: 3 (02/22/19 0805)  LLE Motor Response: Purposeful movement (02/22/19 1330) LLE Sensation: Decreased (02/22/19 1330) RLE Motor Response: Purposeful movement (02/22/19 1330) RLE Sensation: Decreased (02/22/19 1330) L Sensory Level: L3-Anterior knee, lower leg (02/22/19 1330) R Sensory Level: L3-Anterior knee, lower leg (02/22/19 1330) Epidural/Spinal Function Cutaneous sensation: Able to Discern Pressure (02/22/19 1330), Patient able to flex knees: Yes (02/22/19 1330)  Huston Foley

## 2019-02-22 NOTE — Anesthesia Procedure Notes (Signed)
Anesthesia Regional Block: Adductor canal block   Pre-Anesthetic Checklist: ,, timeout performed, Correct Patient, Correct Site, Correct Laterality, Correct Procedure, Correct Position, site marked, Risks and benefits discussed,  Surgical consent,  Pre-op evaluation,  At surgeon's request and post-op pain management  Laterality: Lower and Left  Prep: chloraprep       Needles:  Injection technique: Single-shot  Needle Type: Echogenic Stimulator Needle     Needle Length: 10cm  Needle Gauge: 21   Needle insertion depth: 3 cm   Additional Needles:   Procedures:,,,, ultrasound used (permanent image in chart),,,,  Narrative:  Start time: 02/22/2019 9:20 AM End time: 02/22/2019 9:30 AM Injection made incrementally with aspirations every 5 mL. Anesthesiologist: Lyn Hollingshead, MD

## 2019-02-22 NOTE — Brief Op Note (Signed)
02/22/2019  12:09 PM  PATIENT:  Jared [name]  78 y.o. male  PRE-OPERATIVE DIAGNOSIS:  left knee osteoarthritis, end stage  POST-OPERATIVE DIAGNOSIS:  left knee osteoarthritis, end stage  PROCEDURE:  Procedure(s): TOTAL KNEE ARTHROPLASTY (Left)  SURGEON:  Surgeon(s) and Role:    [name], MD - Primary  PHYSICIAN ASSISTANT:   ASSISTANTS: Ventura Bruns, PA-C   ANESTHESIA:   regional and spinal  EBL:  Minimal   BLOOD ADMINISTERED:none  DRAINS: none   LOCAL MEDICATIONS USED:  NONE  SPECIMEN:  No Specimen  DISPOSITION OF SPECIMEN:  N/A  COUNTS:  YES  TOURNIQUET:  * Missing tourniquet times found for documented tourniquets in log: [id number] *  DICTATION: .Other Dictation: Dictation Number [id number]  PLAN OF CARE: Admit to inpatient   PATIENT DISPOSITION:  PACU - hemodynamically stable.   Delay start of Pharmacological VTE agent (>24hrs) due to surgical blood loss or risk of bleeding: no

## 2019-02-22 NOTE — Interval H&P Note (Signed)
History and Physical Interval Note:  02/22/2019 9:50 AM  [name]  has presented today for surgery, with the diagnosis of Left knee osteoarthritis.  The various methods of treatment have been discussed with the patient and family. After consideration of risks, benefits and other options for treatment, the patient has consented to  Procedure(s): TOTAL KNEE ARTHROPLASTY (Left) as a surgical intervention.  The patient's history has been reviewed, patient examined, no change in status, stable for surgery.  I have reviewed the patient's chart and labs.  Questions were answered to the patient's satisfaction.     Jared [name]

## 2019-02-22 NOTE — Anesthesia Procedure Notes (Signed)
Spinal  Patient location during procedure: OR Start time: 02/22/2019 10:12 AM End time: 02/22/2019 10:14 AM Staffing Resident/CRNA: ,  C, CRNA Performed: resident/CRNA  Preanesthetic Checklist Completed: patient identified, site marked, surgical consent, pre-op evaluation, timeout performed, IV checked, risks and benefits discussed and monitors and equipment checked Spinal Block Patient position: sitting Prep: DuraPrep Patient monitoring: heart rate, cardiac monitor, continuous pulse ox and blood pressure Approach: midline Location: L3-4 Injection technique: single-shot Needle Needle type: Pencan  Needle gauge: 24 G Needle length: 9 cm Assessment Sensory level: T4 Additional Notes IV functioning, monitors applied to pt. Expiration date of kit checked and confirmed to be in date. Sterile prep and drape, hand hygiene and sterile gloved used. Pt was positioned and spine was prepped in sterile fashion. Skin was anesthetized with lidocaine. Free flow of clear CSF obtained prior to injecting local anesthetic into CSF x 1 attempt. Spinal needle aspirated freely following injection. Needle was carefully withdrawn, and pt tolerated procedure well. Loss of motor and sensory on exam post injection.      

## 2019-02-22 NOTE — Op Note (Signed)
NAME: Jared [name], [place] MEDICAL RECORD OF:[id number] ACCOUNT [id number] DATE OF BIRTH:08/10/1941 FACILITY: [facility] LOCATION: [facility] PHYSICIAN:STEVEN Orlena Sheldon, MD  OPERATIVE REPORT  DATE OF PROCEDURE:  02/22/2019  PREOPERATIVE DIAGNOSIS:  Left knee end-stage arthritis.  POSTOPERATIVE DIAGNOSIS:  Left knee end-stage arthritis.  PROCEDURE PERFORMED:  Left total knee arthroplasty using DePuy Sigma rotating platform prosthesis.  ATTENDING SURGEON:  [name], MD  ASSISTANT:  Darol Destine, [place], who was scrubbed during the entire procedure and necessary for satisfactory completion of surgery.  ANESTHESIA:  Spinal anesthesia was used plus an adductor canal block.  ESTIMATED BLOOD LOSS:  Minimal.  FLUID REPLACEMENT:  1200 mL crystalloid.  INSTRUMENT COUNTS:  Correct.  COMPLICATIONS:  No complications.  ANTIBIOTICS:  Perioperative antibiotics were given.  TOURNIQUET:  Set at 300 mmHg and utilized for 1 hour.  INDICATIONS:  The patient is a 78 year old male with worsening left knee pain secondary to end-stage arthritis.  The patient has had progressive pain despite conservative management.  Desires operative treatment to restore function and eliminate pain.   Informed consent obtained.  DESCRIPTION OF PROCEDURE:  After an adequate level of anesthesia was achieved, the patient was positioned supine on the operating table.  Left leg correctly identified and a nonsterile tourniquet placed on the proximal thigh.  The left leg sterilely  prepped and draped in the usual manner.  Time-out called verifying correct patient, correct site.  We then elevated the leg, exsanguinated with an Esmarch bandage and placed the knee in flexion.  We did a longitudinal midline incision with a 10-blade  scalpel, dissection down through subcutaneous tissues.  We used a fresh 10 blade for the parapatellar arthrotomy and divided the lateral patellofemoral ligaments, everted the patella.  The patient did have  a dysplastic knee with a very shallow trochlea,  a flat patella with no central keel and then also pretty wide notch.  At this point, there was end-stage arthritis noted with evidence of full-thickness cartilage loss.  We went ahead and entered the distal femur with a step-cut drill, placed our  intramedullary guide and resected 10 mm off the distal femur, set on 5 degrees left.  We next went ahead and instead of cutting the femur due to dysplastic aspect of the condyle, we went ahead and cut the tibia.  We resected ACL, PCL, meniscal tissue,  subluxed the tibia anteriorly and performed a perpendicular cut to the long axis of tibia with minimal posterior slope to the posterior cruciate substituting prosthesis.  Once we had completed that, we just took 2 mm off the affected medial side.  Then  we went a couple more millimeters to try to get down to the base of the artery, get below the subchondral bone, or just to the subchondral bone and below the cartilage on the medial aspect of the tibial cut, but we had a nice perpendicular cut with  minimal slope.  Then, we went ahead and set our rotation on our femur and made our femoral cuts with the 4-in-1 block, AP and chamfer cuts with the oscillating saw.  We then checked our gaps, which were symmetric at 10 mm.  We resected the excess  posterior bone off the femurs and then went ahead and completed our tibial preparation with the modular drill and keel punch and then completed the box cut with the box-cut guide.  We placed our trial components.  A size 5 tibia, size 5 left femur,  placed a 10 poly trial in place and reduced  the knee.  We had good stability.  It felt like we could probably get a 12.5 in.  At this point, we resurfaced the patella.  It was 27 mm thick.  We went down to 17 mm, and then drilled the lugs for a 41  patella and then placed a 41 patellar button in place and then ranged the knee and had nice patellar tracking with no-touch technique.  We  removed all trial components, irrigated thoroughly, dried the bone.  We drilled the very hard sclerotic bone on the  medial side of the tibia with a 1.6 mm drill bit.  We then vacuum mixed the cement on the back table and cemented the components into place, the tibia, femur and then patella.  We used a patellar clamp to hold until cement was dry, and we also placed  the knee in extension with a 10 mm spacer.  Once the cement was hardened, we removed excess cement with a 3/4-inch curved osteotome.  We went ahead and trialed with a 5, 12.5 poly.  That was the perfect poly thickness and gave [place] good stability, flexion  and extension.  We removed the trial component.  We irrigated thoroughly, checked for cement in the back of the knee, made sure that was clear and then went ahead and selected the real 5, 12.5 poly and placed it on the tibial tray and subluxed the tibia  under the femoral condyle.  We had a nice little pop as it reduced, and then we placed the knee in extension.  We were able to get full extension.  Patellar tracking was excellent.  We then went ahead and irrigated thoroughly and then repaired the  parapatellar arthrotomy with #1 Vicryl suture followed by 2-0 Vicryl for subcutaneous closure and 4-0 Monocryl for skin.  Steri-Strips were applied followed by a sterile dressing.  The patient tolerated surgery well.  LN/NUANCE  D:02/22/2019 T:02/22/2019 JOB:[id number]

## 2019-02-22 NOTE — Transfer of Care (Signed)
Immediate Anesthesia Transfer of Care Note  Patient: Jared [name]  Procedure(s) Performed: TOTAL KNEE ARTHROPLASTY (Left Knee)  Patient Location: PACU  Anesthesia Type:spinal  Level of Consciousness: awake, alert  and oriented  Airway & Oxygen Therapy: Patient Spontanous Breathing and Patient connected to nasal cannula oxygen  Post-op Assessment: Report given to RN and Post -op Vital signs reviewed and stable  Post vital signs: Reviewed and stable  Last Vitals:  Vitals Value Taken Time  BP    Temp    Pulse    Resp    SpO2      Last Pain:  Vitals:   02/22/19 1330  TempSrc:   PainSc: Asleep      Patients Stated Pain Goal: 3 (81/01/75 1025)  Complications: No apparent anesthesia complications

## 2019-02-22 NOTE — Discharge Instructions (Signed)
Ice to the knee constantly.  Keep the incision covered and clean and dry for one week, then ok to get it wet in the shower.  Do exercise as instructed several times per day. Do Not prop anything under the knee.  Prop under the ankle or the heel to promote knee extension.  Wear the knee immobilizer at night to maintain knee extension.   Ok to place your full weight on the left leg.   Use the walker while up walking for balance.  Take baby aspirin twice daily and wear stockings for 4 weeks to prevent blood clots  Follow up with Dr Veverly Fells in two weeks in the office, call [phone] for appt

## 2019-02-23 LAB — BASIC METABOLIC PANEL
Anion gap: 9 (ref 5–15)
BUN: 19 mg/dL (ref 8–23)
CO2: 22 mmol/L (ref 22–32)
Calcium: 8.7 mg/dL — ABNORMAL LOW (ref 8.9–10.3)
Chloride: 107 mmol/L (ref 98–111)
Creatinine, Ser: 1.02 mg/dL (ref 0.61–1.24)
GFR calc Af Amer: >60 mL/min (ref >60)
GFR calc non Af Amer: >60 mL/min (ref >60)
Glucose, Bld: 166 mg/dL — ABNORMAL HIGH (ref 70–99)
Potassium: 4.1 mmol/L (ref 3.5–5.1)
Sodium: 138 mmol/L (ref 135–145)

## 2019-02-23 LAB — CBC
HCT: 37.8 % — ABNORMAL LOW (ref 39.0–52.0)
Hemoglobin: 12.9 g/dL — ABNORMAL LOW (ref 13.0–17.0)
MCH: 31.5 pg (ref 26.0–34.0)
MCHC: 34.1 g/dL (ref 30.0–36.0)
MCV: 92.4 fL (ref 80.0–100.0)
Platelets: 193 10*3/uL (ref 150–400)
RBC: 4.09 MIL/uL — ABNORMAL LOW (ref 4.22–5.81)
RDW: 11.9 % (ref 11.5–15.5)
WBC: 12.9 10*3/uL — ABNORMAL HIGH (ref 4.0–10.5)
nRBC: 0 % (ref 0.0–0.2)

## 2019-02-23 NOTE — Plan of Care (Signed)
  Problem: Clinical Measurements: Goal: Will remain free from infection Outcome: Progressing   Problem: Clinical Measurements: Goal: Diagnostic test results will improve Outcome: Progressing   Problem: Clinical Measurements: Goal: Respiratory complications will improve Outcome: Progressing   Problem: Clinical Measurements: Goal: Cardiovascular complication will be avoided Outcome: Progressing   

## 2019-02-23 NOTE — Progress Notes (Signed)
PT Cancellation Note  Patient Details Name: Jared [name] MRN: [id number] DOB: 1941-03-02   Cancelled Treatment:    Reason Eval/Treat Not Completed: Pain limiting ability to participate. Pt declined 2nd PT session. Will check back on tomorrow morning.    Weston Anna, PT Acute Rehabilitation Services Pager: [phone] Office: [phone]

## 2019-02-23 NOTE — Plan of Care (Signed)
  Problem: Education: Goal: Knowledge of General Education information will improve Description: Including pain rating scale, medication(s)/side effects and non-pharmacologic comfort measures Outcome: Progressing   Problem: Activity: Goal: Risk for activity intolerance will decrease Outcome: Progressing   Problem: Pain Managment: Goal: General experience of comfort will improve Outcome: Progressing   

## 2019-02-23 NOTE — Progress Notes (Signed)
Physical Therapy Treatment Patient Details Name: Jared [name] MRN: [id number] DOB: 1941-01-24 Today's Date: 02/23/2019    History of Present Illness 78 yo male s/p L TKR on 02/22/19. PMH includes anxiety, benzodiazepine dependence, CVA,  MDD    PT Comments    Progressing slowly with mobility. Pt c/o significant pain on today. Will continue to progress activity as tolerated. Plan is for possible d/c home on tomorrow. Pt lives alone and he stated he does not have any in home care arranged outside of [facility].     Follow Up Recommendations  Follow surgeon's recommendation for DC plan and follow-up therapies(unsure of d/c plan-likely home per pt)     Equipment Recommendations       Recommendations for Other Services       Precautions / Restrictions Precautions Precautions: Fall Required Braces or Orthoses: Knee Immobilizer - Left Knee Immobilizer - Left: Discontinue once straight leg raise with < 10 degree lag Restrictions Weight Bearing Restrictions: No    Mobility  Bed Mobility Overal bed mobility: Needs Assistance Bed Mobility: Supine to Sit     Supine to sit: Min assist;HOB elevated     General bed mobility comments: Assist for L LE. Increased time.   Transfers Overall transfer level: Needs assistance Equipment used: Rolling walker (2 wheeled) Transfers: Sit to/from Stand Sit to Stand: Min assist;From elevated surface         General transfer comment: Assist to rise, stabilize. VCs safety, technique, hand/LE placement.   Ambulation/Gait Ambulation/Gait assistance: Min guard Gait Distance (Feet): 40 Feet Assistive device: Rolling walker (2 wheeled) Gait Pattern/deviations: Step-to pattern;Antalgic     General Gait Details: Close guard for safety. Slow gait speed. VCs safety, sequence. Followed with recliner. Distance limited by pain, need to use bathroom   Stairs             Wheelchair Mobility    Modified Rankin (Stroke Patients Only)        Balance Overall balance assessment: Needs assistance         Standing balance support: Bilateral upper extremity supported Standing balance-Leahy Scale: Poor                              Cognition Arousal/Alertness: Awake/alert Behavior During Therapy: WFL for tasks assessed/performed Overall Cognitive Status: Within Functional Limits for tasks assessed                                        Exercises      General Comments        Pertinent Vitals/Pain Pain Assessment: 0-10 Pain Score: 8  Pain Location: L knee Pain Descriptors / Indicators: Sore;Aching Pain Intervention(s): Ice applied;Heat applied;Limited activity within patient's tolerance(heat to thigh)    Home Living                      Prior Function            PT Goals (current goals can now be found in the care plan section) Progress towards PT goals: Progressing toward goals    Frequency    7X/week      PT Plan Current plan remains appropriate    Co-evaluation              AM-PAC PT "6 Clicks" Mobility   Outcome Measure  Help needed turning  from your back to your side while in a flat bed without using bedrails?: A Little Help needed moving from lying on your back to sitting on the side of a flat bed without using bedrails?: A Little Help needed moving to and from a bed to a chair (including a wheelchair)?: A Little Help needed standing up from a chair using your arms (e.g., wheelchair or bedside chair)?: A Little Help needed to walk in hospital room?: A Little Help needed climbing 3-5 steps with a railing? : A Little 6 Click Score: 18    End of Session Equipment Utilized During Treatment: Gait belt;Left knee immobilizer Activity Tolerance: Patient limited by fatigue;Patient limited by pain Patient left: in chair;with call bell/phone within reach   PT Visit Diagnosis: Other abnormalities of gait and mobility (R26.89);Difficulty in walking, not  elsewhere classified (R26.2)     Time: 8403-7543 PT Time Calculation (min) (ACUTE ONLY): 30 min  Charges:  $Gait Training: 8-22 mins $Therapeutic Exercise: 8-22 mins                        Weston Anna, PT Acute Rehabilitation Services Pager: [phone] Office: [phone]

## 2019-02-23 NOTE — Progress Notes (Signed)
Orthopedics Progress Note  Subjective: Patient complaining of some pain in the left knee this AM, but overall he is doing well.  Objective:  Vitals:   02/23/19 0917 02/23/19 1022  BP: (!) 187/103 (!) 160/87  Pulse: 94   Resp: 18   Temp: 98.6 F (37 C)   SpO2: 97%     General: Awake and alert  Musculoskeletal: Left knee dressing CDI.  No significant swelling in the leg and no pain with AROM of the ankle. Neg cords and Neg Homans Neurovascularly intact  Lab Results  Component Value Date   WBC 12.9 (H) 02/23/2019   HGB 12.9 (L) 02/23/2019   HCT 37.8 (L) 02/23/2019   MCV 92.4 02/23/2019   PLT 193 02/23/2019       Component Value Date/Time   NA 138 02/23/2019 0355   K 4.1 02/23/2019 0355   CL 107 02/23/2019 0355   CO2 22 02/23/2019 0355   GLUCOSE 166 (H) 02/23/2019 0355   BUN 19 02/23/2019 0355   CREATININE 1.02 02/23/2019 0355   CALCIUM 8.7 (L) 02/23/2019 0355   GFRNONAA >60 02/23/2019 0355   GFRAA >60 02/23/2019 0355    Lab Results  Component Value Date   INR 1.03 07/26/2017    Assessment/Plan: POD #1 s/p Procedure(s): TOTAL KNEE ARTHROPLASTY DVT prophylaxis, mobilization Pulmonary toilet Dressing change to Aquacel tomorrow with TED hose Doran Heater. Veverly Fells, MD 02/23/2019 10:27 AM

## 2019-02-24 LAB — CBC
HCT: 40.2 % (ref 39.0–52.0)
Hemoglobin: 13.3 g/dL (ref 13.0–17.0)
MCH: 31.1 pg (ref 26.0–34.0)
MCHC: 33.1 g/dL (ref 30.0–36.0)
MCV: 94.1 fL (ref 80.0–100.0)
Platelets: 202 10*3/uL (ref 150–400)
RBC: 4.27 MIL/uL (ref 4.22–5.81)
RDW: 12.2 % (ref 11.5–15.5)
WBC: 13 10*3/uL — ABNORMAL HIGH (ref 4.0–10.5)
nRBC: 0 % (ref 0.0–0.2)

## 2019-02-24 NOTE — Progress Notes (Signed)
Subjective: 2 Days Post-Op Procedure(s) (LRB): TOTAL KNEE ARTHROPLASTY (Left) Patient reports pain as mild.   Patient seen in rounds for Dr. Veverly Fells.  Patient is well, and has had no acute complaints or problems other than discomfort in the left knee. No SOB or chest pain. Voiding well. Positive flatus. Reports no help at home and need for SNF.    Objective: Vital signs in last 24 hours: Temp:  [97.7 F (36.5 C)-99.1 F (37.3 C)] 97.7 F (36.5 C) (06/07 0650) Pulse Rate:  [60-94] 76 (06/07 0650) Resp:  [16-18] 16 (06/07 0650) BP: (150-187)/(65-103) 155/82 (06/07 0650) SpO2:  [94 %-97 %] 94 % (06/07 0650)  Intake/Output from previous day:  Intake/Output Summary (Last 24 hours) at 02/24/2019 0858 Last data filed at 02/24/2019 0831 Gross per 24 hour  Intake 1680 ml  Output 2850 ml  Net -1170 ml    Intake/Output this shift: Total I/O In: 480 [P.O.:480] Out: 600 [Urine:600]  Labs: Recent Labs    02/23/19 0355 02/24/19 0337  HGB 12.9* 13.3   Recent Labs    02/23/19 0355 02/24/19 0337  WBC 12.9* 13.0*  RBC 4.09* 4.27  HCT 37.8* 40.2  PLT 193 202   Recent Labs    02/23/19 0355  NA 138  K 4.1  CL 107  CO2 22  BUN 19  CREATININE 1.02  GLUCOSE 166*  CALCIUM 8.7*   EXAM General - Patient is Alert and Oriented Extremity - Neurologically intact Intact pulses distally Dorsiflexion/Plantar flexion intact No cellulitis present Compartment soft Dressing/Incision - clean, dry, no drainage Motor Function - intact, moving foot and toes well on exam.   Past Medical History:  Diagnosis Date  . Allergic rhinitis   . Anticoagulant long-term use    xarelto  . Anxiety   . Benign localized prostatic hyperplasia with lower urinary tract symptoms (LUTS)   . Benzodiazepine dependence ([place])   . Edema of right lower extremity   . GERD (gastroesophageal reflux disease)   . Heart murmur   . Hematuria   . Hepatitis    History of  . History of colonic polyps   .  History of CVA (cerebrovascular accident) without residual deficits 07/26/2017   crytogenic stroke -- acute left caudate body infarct   . History of kidney stones   . Hypertension   . Ischemic cardiomyopathy 07/26/2017   ef 35-40%;    TEE 07-31-2017 ef 60-65%  . MDD (major depressive disorder), recurrent, in partial remission ([place])   . OA (osteoarthritis)    knees,   . OSA (obstructive sleep apnea)    intolerant cpap  . PAF (paroxysmal atrial fibrillation) [facility])    cardiologist-  dr g. taylor  . Skin cancer    Childhood, right foot on toe  . Status post placement of implantable loop recorder 07/31/2017  . Stroke ([place])   . Swelling of right foot 12/2018  . Throat mass   . Wears glasses     Assessment/Plan: 2 Days Post-Op Procedure(s) (LRB): TOTAL KNEE ARTHROPLASTY (Left) Active Problems:   Status post total knee replacement, left  Estimated body mass index is 29.29 kg/m as calculated from the following:   Height as of this encounter: 5\' 10"  (1.778 m).   Weight as of this encounter: 92.6 kg. Advance diet Up with therapy D/C IV fluids  DVT Prophylaxis - Xarelto Weight-Bearing as tolerated  Continue with therapy today. Hopeful for DC to SNF tomorrow pending approval and placement.  Dressing changed to Aquacel. TED hose  placed.   Ardeen Jourdain, PA-C Orthopaedic Surgery 02/24/2019, 8:58 AM

## 2019-02-24 NOTE — NC FL2 (Signed)
[place] LEVEL OF CARE SCREENING TOOL     IDENTIFICATION  Patient Name: Jared [name] Birthdate: 19-Apr-1941 Sex: male Admission Date (Current Location): 02/22/2019  [facility] and [place] Number:  Herbalist and Address:  [facility],  [place], [place]      Provider Number: [id number]  Attending Physician Name and Address:  [name], MD  Relative Name and Phone Number:  Oris Drone, [phone]    Current Level of Care: Hospital Recommended Level of Care: [place] Prior Approval Number:    Date Approved/Denied:   PASRR Number: [id number]  Discharge Plan: SNF    Current Diagnoses: Patient Active Problem List   Diagnosis Date Noted  . Status post total knee replacement, left 02/22/2019  . Swelling of right foot 01/04/2019  . Preoperative evaluation to rule out surgical contraindication 11/14/2018  . Hematuria 10/28/2018  . Osteoarthritis of right knee 06/20/2018  . Atrial fibrillation ([place])   . MDD (major depressive disorder), recurrent, in partial remission ([place])   . Benzodiazepine dependence ([place])   . Ejection fraction < 50% 11/07/2017  . Hyperlipidemia   . Dizziness 07/27/2017  . Frequent PVCs 07/27/2017  . Cryptogenic stroke ([place]) 07/27/2017  . Diverticulosis of colon without hemorrhage 02/06/2013  . Right inguinal hernia 10/12/2012  . BPH (benign prostatic hyperplasia) 04/08/2009  . OSA (obstructive sleep apnea) 10/02/2008  . Essential hypertension 10/05/2007  . Allergic rhinitis 10/05/2007  . GERD 10/05/2007  . History of colonic polyps 10/05/2007  . NEPHROLITHIASIS, HX OF 10/05/2007    Orientation RESPIRATION BLADDER Height & Weight     Self, Time, Situation, Place  Normal Continent Weight: 204 lb 2.3 oz (92.6 kg) Height:  5\' 10"  (177.8 cm)  BEHAVIORAL SYMPTOMS/MOOD NEUROLOGICAL BOWEL NUTRITION STATUS      Continent Diet(regular)  AMBULATORY STATUS COMMUNICATION OF NEEDS Skin    Limited Assist Verbally Surgical wounds                       Personal Care Assistance Level of Assistance  Bathing, Feeding, Dressing Bathing Assistance: Limited assistance Feeding assistance: Independent Dressing Assistance: Limited assistance     Functional Limitations Info  Sight, Hearing, Speech Sight Info: Adequate Hearing Info: Adequate Speech Info: Adequate    SPECIAL CARE FACTORS FREQUENCY  PT (By licensed PT), OT (By licensed OT)     PT Frequency: 5x wk OT Frequency: 5x wk            Contractures Contractures Info: Not present    Additional Factors Info  Code Status, Allergies Code Status Info: full code Allergies Info: ANTIHISTAMINES, DIPHENHYDRAMINE-TYPE, ASPIRIN, CODEINE, POLLEN EXTRACT           Current Medications (02/24/2019):  This is the current hospital active medication list Current Facility-Administered Medications  Medication Dose Route Frequency Provider Last Rate Last Dose  . 0.9 %  sodium chloride infusion   Intravenous Continuous [name], MD   Stopped at 02/23/19 1605  . acetaminophen (TYLENOL) tablet 325-650 mg  325-650 mg Oral Q6H PRN [name], MD   650 mg at 02/24/19 [phone]  . ALPRAZolam Duanne Moron) tablet 0.5 mg  0.5 mg Oral TID PRN [name], MD   0.5 mg at 02/24/19 1224  . amLODipine (NORVASC) tablet 10 mg  10 mg Oral Daily [name], MD   10 mg at 02/24/19 0834  . bisacodyl (DULCOLAX) suppository 10 mg  10 mg Rectal Daily PRN [name], MD      .  buPROPion (WELLBUTRIN SR) 12 hr tablet 150 mg  150 mg Oral BID [name], MD   150 mg at 02/24/19 6237  . carvedilol (COREG) tablet 6.25 mg  6.25 mg Oral BID WC [name], MD   6.25 mg at 02/24/19 6283  . cycloSPORINE (RESTASIS) 0.05 % ophthalmic emulsion 1 drop  1 drop Both Eyes BID [name], MD   1 drop at 02/24/19 [phone]  . docusate sodium (COLACE) capsule 100 mg  100 mg Oral BID [name], MD   100 mg at 02/24/19 0834  . dutasteride (AVODART) capsule  0.5 mg  0.5 mg Oral Daily [name], MD   0.5 mg at 02/24/19 0834  . ferrous sulfate tablet 325 mg  325 mg Oral TID Yolonda Kida, MD   325 mg at 02/24/19 1224  . HYDROmorphone (DILAUDID) injection 0.5-1 mg  0.5-1 mg Intravenous Q4H PRN [name], MD   0.5 mg at 02/24/19 0520  . irbesartan (AVAPRO) tablet 75 mg  75 mg Oral Daily [name], MD   75 mg at 02/24/19 6160  . menthol-cetylpyridinium (CEPACOL) lozenge 3 mg  1 lozenge Oral PRN [name], MD       Or  . phenol (CHLORASEPTIC) mouth spray 1 spray  1 spray Mouth/Throat PRN [name], MD      . methocarbamol (ROBAXIN) tablet 500 mg  500 mg Oral Q6H PRN [name], MD   500 mg at 02/24/19 7371   Or  . methocarbamol (ROBAXIN) 500 mg in dextrose 5 % 50 mL IVPB  500 mg Intravenous Q6H PRN [name], MD 100 mL/hr at 02/22/19 1241 500 mg at 02/22/19 1241  . metoCLOPramide (REGLAN) tablet 5-10 mg  5-10 mg Oral Q8H PRN [name], MD       Or  . metoCLOPramide (REGLAN) injection 5-10 mg  5-10 mg Intravenous Q8H PRN [name], MD      . mirtazapine (REMERON) tablet 15 mg  15 mg Oral Donavan Burnet, MD   15 mg at 02/23/19 2136  . multivitamin with minerals tablet 1 tablet  1 tablet Oral Daily [name], MD   1 tablet at 02/24/19 [phone]  . ondansetron (ZOFRAN) tablet 4 mg  4 mg Oral Q6H PRN [name], MD       Or  . ondansetron [facility]) injection 4 mg  4 mg Intravenous Q6H PRN [name], MD   4 mg at 02/23/19 1404  . oxyCODONE (Oxy IR/ROXICODONE) immediate release tablet 5-10 mg  5-10 mg Oral Q4H PRN [name], MD   10 mg at 02/23/19 1358  . pantoprazole (PROTONIX) EC tablet 40 mg  40 mg Oral Daily [name], MD   40 mg at 02/24/19 9485  . polyethylene glycol (MIRALAX / GLYCOLAX) packet 17 g  17 g Oral Daily PRN [name], MD      . polyvinyl alcohol (LIQUIFILM TEARS) 1.4 % ophthalmic solution 1 drop  1 drop Both Eyes Daily PRN [name], MD      . rivaroxaban Alveda [employer]asons) tablet 20 mg  20  mg Oral Q supper [name], MD   20 mg at 02/23/19 1656  . timolol (TIMOPTIC) 0.5 % ophthalmic solution 1 drop  1 drop Both Eyes BID [name], MD   1 drop at 02/24/19 4627     Discharge Medications: Please see discharge summary for a list of discharge medications.  Relevant Imaging Results:  Relevant Lab Results:   Additional Information SS# [id number]  Odessie Polzin  Philis Pique, LCSW

## 2019-02-24 NOTE — TOC Initial Note (Signed)
Transition of Care [facility]) - Initial/Assessment Note    Patient Details  Name: Jared [name] MRN: [id number] Date of Birth: [birth year]  Transition of Care [facility]) CM/SW Contact:    Wende Neighbors, LCSW Phone Number: 02/24/2019, 2:46 PM  Clinical Narrative:   Clinical Social Worker following patient for support and discharge needs. Patient came in for a total left knee arthroplasty. CSW spoke with patient at bedside about discharge plans. Patient stated he lives at home alone and has support from his daughter Eustaquio Maize but stated Eustaquio Maize lives on the [place] so she will not be able to help with recovery. Patient stated he is agreeable with discharge plans to a SNF and would like daughter Eustaquio Maize involved in the decision. CSW was given verbal permission to speak to patients daughter    Expected Discharge Plan: Skilled Nursing Facility Barriers to Discharge: Continued Medical Work up, Ship broker   Patient Goals and CMS Choice Patient states their goals for this hospitalization and ongoing recovery are:: to be able to go home       Expected Discharge Plan and Services Expected Discharge Plan: [place]   Discharge Planning Services: CM Consult                                          Prior Living Arrangements/Services   Lives with:: Self Patient language and need for interpreter reviewed:: Yes Do you feel safe going back to the place where you live?: Yes      Need for Family Participation in Patient Care: Yes (Comment) Care giver support system in place?: Yes (comment)   Criminal Activity/Legal Involvement Pertinent to Current Situation/Hospitalization: No - Comment as needed  Activities of Daily Living Home Assistive Devices/Equipment: Eyeglasses, Walker (specify type) ADL Screening (condition at time of admission) Patient's cognitive ability adequate to safely complete daily activities?: Yes Is the patient deaf or have difficulty hearing?: No Does the  patient have difficulty seeing, even when wearing glasses/contacts?: No Does the patient have difficulty concentrating, remembering, or making decisions?: No Patient able to express need for assistance with ADLs?: Yes Does the patient have difficulty dressing or bathing?: No Independently performs ADLs?: Yes (appropriate for developmental age) Does the patient have difficulty walking or climbing stairs?: Yes Weakness of Legs: Both Weakness of Arms/Hands: None  Permission Sought/Granted Permission sought to share information with : Family Supports Permission granted to share information with : Yes, Verbal Permission Granted  Share Information with NAME: Oris Drone     Permission granted to share info w Relationship: daughter  Permission granted to share info w Contact Information: [phone]  Emotional Assessment Appearance:: Appears stated age Attitude/Demeanor/Rapport: Engaged Affect (typically observed): Accepting Orientation: : Oriented to Self, Oriented to  Time, Oriented to Place, Oriented to Situation Alcohol / Substance Use: Not Applicable Psych Involvement: No (comment)  Admission diagnosis:  Right knee osteoarthritis Patient Active Problem List   Diagnosis Date Noted  . Status post total knee replacement, left 02/22/2019  . Swelling of right foot 01/04/2019  . Preoperative evaluation to rule out surgical contraindication 11/14/2018  . Hematuria 10/28/2018  . Osteoarthritis of right knee 06/20/2018  . Atrial fibrillation ([place])   . MDD (major depressive disorder), recurrent, in partial remission ([place])   . Benzodiazepine dependence ([place])   . Ejection fraction < 50% 11/07/2017  . Hyperlipidemia   . Dizziness 07/27/2017  . Frequent PVCs  07/27/2017  . Cryptogenic stroke ([place]) 07/27/2017  . Diverticulosis of colon without hemorrhage 02/06/2013  . Right inguinal hernia 10/12/2012  . BPH (benign prostatic hyperplasia) 04/08/2009  . OSA (obstructive sleep apnea)  10/02/2008  . Essential hypertension 10/05/2007  . Allergic rhinitis 10/05/2007  . GERD 10/05/2007  . History of colonic polyps 10/05/2007  . NEPHROLITHIASIS, HX OF 10/05/2007   PCP:  [name], MD Pharmacy:   [id number] [place], [place] [address] Phone: [phone] Fax: [phone]     Social Determinants of Health (SDOH) Interventions    Readmission Risk Interventions No flowsheet data found.

## 2019-02-24 NOTE — Progress Notes (Signed)
Physical Therapy Treatment Patient Details Name: GIDEON BURSTEIN MRN: [id number] DOB: 05-01-1941 Today's Date: 02/24/2019    History of Present Illness 78 yo male s/p L TKR on 02/22/19. PMH includes anxiety, benzodiazepine dependence, CVA,  MDD    PT Comments    Improved activity tolerance this afternoon. Will continue to follow. Plan is for SNF.    Follow Up Recommendations  Follow surgeon's recommendation for DC plan and follow-up therapies(SNF)     Equipment Recommendations  Rolling walker with 5" wheels    Recommendations for Other Services       Precautions / Restrictions Precautions Precautions: Fall Required Braces or Orthoses: Knee Immobilizer - Left Knee Immobilizer - Left: Discontinue once straight leg raise with < 10 degree lag Restrictions Weight Bearing Restrictions: No Other Position/Activity Restrictions: WBAT    Mobility  Bed Mobility Overal bed mobility: Needs Assistance Bed Mobility: Supine to Sit     Supine to sit: Min assist;HOB elevated Sit to supine: Min assist;HOB elevated   General bed mobility comments: Assist for L LE. Increased time. Cues for technique  Transfers Overall transfer level: Needs assistance Equipment used: Rolling walker (2 wheeled) Transfers: Sit to/from Stand Sit to Stand: Mod assist;From elevated surface         General transfer comment: Assist to rise, stabilize. VCs safety, technique, hand/LE placement. Increased time.   Ambulation/Gait Ambulation/Gait assistance: Min assist;+2 safety/equipment Gait Distance (Feet): 62 Feet Assistive device: Rolling walker (2 wheeled) Gait Pattern/deviations: Step-to pattern;Trunk flexed     General Gait Details: Assist to stabilize pt throughout distance. VCs safety, sequence. Distance limited by pain, fatigue.    Stairs             Wheelchair Mobility    Modified Rankin (Stroke Patients Only)       Balance Overall balance assessment: Needs assistance          Standing balance support: Bilateral upper extremity supported Standing balance-Leahy Scale: Poor                              Cognition Arousal/Alertness: Awake/alert Behavior During Therapy: WFL for tasks assessed/performed;Flat affect Overall Cognitive Status: Within Functional Limits for tasks assessed                                        Exercises      General Comments        Pertinent Vitals/Pain Pain Assessment: 0-10 Pain Score: 7  Pain Location: L knee Pain Descriptors / Indicators: Sore;Aching Pain Intervention(s): Monitored during session;Repositioned;Ice applied;Heat applied(heat to thigh)    Home Living                      Prior Function            PT Goals (current goals can now be found in the care plan section) Progress towards PT goals: Progressing toward goals    Frequency    7X/week      PT Plan Current plan remains appropriate    Co-evaluation              AM-PAC PT "6 Clicks" Mobility   Outcome Measure  Help needed turning from your back to your side while in a flat bed without using bedrails?: A Little Help needed moving from lying on your back to sitting on  the side of a flat bed without using bedrails?: A Little Help needed moving to and from a bed to a chair (including a wheelchair)?: A Little Help needed standing up from a chair using your arms (e.g., wheelchair or bedside chair)?: A Lot Help needed to walk in hospital room?: A Little Help needed climbing 3-5 steps with a railing? : A Lot 6 Click Score: 16    End of Session Equipment Utilized During Treatment: Gait belt;Left knee immobilizer Activity Tolerance: Patient limited by fatigue;Patient limited by pain Patient left: in bed;with call bell/phone within reach;with bed alarm set   PT Visit Diagnosis: Other abnormalities of gait and mobility (R26.89);Difficulty in walking, not elsewhere classified (R26.2)     Time:  1062-6948 PT Time Calculation (min) (ACUTE ONLY): 16 min  Charges:  $Gait Training: 8-22 mins                        Weston Anna, PT Acute Rehabilitation Services Pager: [phone] Office: [phone]

## 2019-02-24 NOTE — Progress Notes (Signed)
Physical Therapy Treatment Patient Details Name: Jared [name] MRN: [id number] DOB: [birth year] Today's Date: 02/24/2019    History of Present Illness 78 yo male s/p L TKR on 02/22/19. PMH includes anxiety, benzodiazepine dependence, CVA,  MDD    PT Comments    Pt is not progressing well. Mobility and ROM are limited by pain although pt reports pain is better than it was on yesterday. During session today, pt became nauseous and diaphoretic. Noted pallor as well once sitting EOB after walking a short distance. Assisted pt back to bed- BP 188/81 in supine. Deferred any further mobility this session. Will continue to follow.     Follow Up Recommendations  Follow surgeon's recommendation for DC plan and follow-up therapies     Equipment Recommendations  Rolling walker with 5" wheels;3in1 (PT)    Recommendations for Other Services       Precautions / Restrictions Precautions Precautions: Fall Required Braces or Orthoses: Knee Immobilizer - Left Knee Immobilizer - Left: Discontinue once straight leg raise with < 10 degree lag Restrictions Weight Bearing Restrictions: No Other Position/Activity Restrictions: WBAT    Mobility  Bed Mobility Overal bed mobility: Needs Assistance Bed Mobility: Supine to Sit     Supine to sit: Min assist     General bed mobility comments: Assist for L LE. Increased time. Practiced using gait belt to assist LE off bed. Cues for technique  Transfers Overall transfer level: Needs assistance Equipment used: Rolling walker (2 wheeled) Transfers: Sit to/from Stand Sit to Stand: Mod assist;From elevated surface         General transfer comment: Assist to rise, stabilize. VCs safety, technique, hand/LE placement. Increased time.   Ambulation/Gait Ambulation/Gait assistance: Min assist;+2 safety/equipment Gait Distance (Feet): 35 Feet Assistive device: Rolling walker (2 wheeled) Gait Pattern/deviations: Step-to pattern     General Gait Details:  Assist to stabilize, especially towards end of distance. Pt intially reported need to urinate. While ambulating back to room, pt c/o nausea and became diaphoretic.    Stairs             Wheelchair Mobility    Modified Rankin (Stroke Patients Only)       Balance Overall balance assessment: Needs assistance         Standing balance support: Bilateral upper extremity supported Standing balance-Leahy Scale: Poor                              Cognition Arousal/Alertness: Awake/alert Behavior During Therapy: WFL for tasks assessed/performed;Flat affect Overall Cognitive Status: Within Functional Limits for tasks assessed                                        Exercises Total Joint Exercises Ankle Circles/Pumps: AROM;Both;10 reps;Supine Quad Sets: AROM;Both;10 reps;Supine Heel Slides: AAROM;Left;10 reps;Supine Hip ABduction/ADduction: AAROM;Left;10 reps;Supine Straight Leg Raises: AAROM;Left;10 reps;Supine Goniometric ROM: ~10-45 degrees (limited by pain)    General Comments        Pertinent Vitals/Pain Pain Assessment: 0-10 Pain Score: 7  Pain Location: L knee Pain Descriptors / Indicators: Sore;Aching Pain Intervention(s): Limited activity within patient's tolerance;Monitored during session;Repositioned;Ice applied    Home Living                      Prior Function            PT  Goals (current goals can now be found in the care plan section) Progress towards PT goals: Progressing toward goals    Frequency    7X/week      PT Plan Current plan remains appropriate    Co-evaluation              AM-PAC PT "6 Clicks" Mobility   Outcome Measure  Help needed turning from your back to your side while in a flat bed without using bedrails?: A Little Help needed moving from lying on your back to sitting on the side of a flat bed without using bedrails?: A Little Help needed moving to and from a bed to a chair  (including a wheelchair)?: A Little Help needed standing up from a chair using your arms (e.g., wheelchair or bedside chair)?: A Lot Help needed to walk in hospital room?: A Lot Help needed climbing 3-5 steps with a railing? : A Lot 6 Click Score: 15    End of Session Equipment Utilized During Treatment: Gait belt;Left knee immobilizer Activity Tolerance: Patient limited by fatigue;Patient limited by pain Patient left: in bed;with call bell/phone within reach;with bed alarm set   PT Visit Diagnosis: Other abnormalities of gait and mobility (R26.89);Difficulty in walking, not elsewhere classified (R26.2)     Time: 3382-5053 PT Time Calculation (min) (ACUTE ONLY): 31 min  Charges:  $Gait Training: 8-22 mins $Therapeutic Exercise: 8-22 mins             Weston Anna, PT Acute Rehabilitation Services Pager: [phone] Office: [phone]

## 2019-02-24 NOTE — Progress Notes (Signed)
Per Physical therapy report pt got weak and tired overall. They report he also got pale and sweaty overall. They were able to return him back to bed. Rn later checked on pt and his condition had overall improved. No changes to note at this time. Pt did not want pain meds at this time.

## 2019-02-25 ENCOUNTER — Encounter (HOSPITAL_COMMUNITY): Payer: Self-pay | Admitting: Orthopedic Surgery

## 2019-02-25 MED ORDER — OXYCODONE-ACETAMINOPHEN 5-325 MG PO TABS: 1.0000 | ORAL_TABLET | Freq: Four times a day (QID) | ORAL | 0 refills | Status: DC | PRN

## 2019-02-25 MED ORDER — RIVAROXABAN 20 MG PO TABS: 20.0000 mg | ORAL_TABLET | Freq: Every day | ORAL | 0 refills | Status: DC

## 2019-02-25 MED ORDER — METHOCARBAMOL 500 MG PO TABS: 500.0000 mg | ORAL_TABLET | Freq: Three times a day (TID) | ORAL | 1 refills | Status: DC | PRN

## 2019-02-25 NOTE — Progress Notes (Signed)
Physical Therapy Treatment Patient Details Name: Jared [name] MRN: [id number] DOB: July 03, 1941 Today's Date: 02/25/2019    History of Present Illness 78 yo male s/p L TKR on 02/22/19. PMH includes anxiety, benzodiazepine dependence, CVA,  MDD    PT Comments    Progressing slowly with mobility. Moderate pain with activity. Plan is for possible d/c to SNF later today.    Follow Up Recommendations  Follow surgeon's recommendation for DC plan and follow-up therapies(SNF)     Equipment Recommendations  (TBD at next venue)    Recommendations for Other Services       Precautions / Restrictions Precautions Required Braces or Orthoses: Knee Immobilizer - Left Knee Immobilizer - Left: Discontinue once straight leg raise with < 10 degree lag Restrictions Weight Bearing Restrictions: No Other Position/Activity Restrictions: WBAT    Mobility  Bed Mobility Overal bed mobility: Needs Assistance Bed Mobility: Supine to Sit     Supine to sit: Min assist;HOB elevated     General bed mobility comments: Assist for L LE. Increased time. Cues for technique  Transfers Overall transfer level: Needs assistance Equipment used: Rolling walker (2 wheeled) Transfers: Sit to/from Stand Sit to Stand: Mod assist;From elevated surface         General transfer comment: Assist to rise, stabilize. VCs safety, technique, hand/LE placement. Increased time.   Ambulation/Gait Ambulation/Gait assistance: Min assist Gait Distance (Feet): 62 Feet Assistive device: Rolling walker (2 wheeled) Gait Pattern/deviations: Step-to pattern     General Gait Details: Assist to stabilize pt throughout distance. VCs safety, sequence. Distance limited by pain, fatigue.    Stairs             Wheelchair Mobility    Modified Rankin (Stroke Patients Only)       Balance                                            Cognition Arousal/Alertness: Awake/alert Behavior During Therapy: WFL  for tasks assessed/performed Overall Cognitive Status: Within Functional Limits for tasks assessed                                        Exercises Total Joint Exercises Ankle Circles/Pumps: AROM;Both;10 reps;Supine Quad Sets: AROM;Both;10 reps;Supine Heel Slides: AAROM;Left;10 reps;Supine Hip ABduction/ADduction: AAROM;Left;10 reps;Supine Straight Leg Raises: AAROM;Left;10 reps;Supine Goniometric ROM: ~10-45 degrees (limited by pain)    General Comments        Pertinent Vitals/Pain Pain Assessment: 0-10 Pain Score: 7  Pain Location: L knee Pain Descriptors / Indicators: Sore;Aching Pain Intervention(s): Monitored during session;Repositioned;Ice applied;Heat applied(heat to thigh)    Home Living                      Prior Function            PT Goals (current goals can now be found in the care plan section) Progress towards PT goals: Progressing toward goals    Frequency    7X/week      PT Plan Current plan remains appropriate    Co-evaluation              AM-PAC PT "6 Clicks" Mobility   Outcome Measure  Help needed turning from your back to your side while in a flat bed without using bedrails?:  A Little Help needed moving from lying on your back to sitting on the side of a flat bed without using bedrails?: A Little Help needed moving to and from a bed to a chair (including a wheelchair)?: A Little Help needed standing up from a chair using your arms (e.g., wheelchair or bedside chair)?: A Lot Help needed to walk in hospital room?: A Little Help needed climbing 3-5 steps with a railing? : A Lot 6 Click Score: 16    End of Session Equipment Utilized During Treatment: Gait belt;Left knee immobilizer Activity Tolerance: Patient limited by fatigue;Patient limited by pain Patient left: in bed;with call bell/phone within reach;with bed alarm set   PT Visit Diagnosis: Other abnormalities of gait and mobility (R26.89);Difficulty in  walking, not elsewhere classified (R26.2)     Time: 4695-0722 PT Time Calculation (min) (ACUTE ONLY): 27 min  Charges:  $Gait Training: 8-22 mins $Therapeutic Exercise: 8-22 mins                       Weston Anna, PT Acute Rehabilitation Services Pager: [phone] Office: [phone]

## 2019-02-25 NOTE — Progress Notes (Signed)
Updated discharge summary sent to [facility]. Rep. [facility] inform.  CSW called for [facility] transport.   Kathrin Greathouse, Marlinda Mike, MSW Clinical Social Worker  [phone] 02/25/2019  4:04 PM

## 2019-02-25 NOTE — Discharge Summary (Signed)
Orthopedic Discharge Summary        Physician Discharge Summary  Patient ID: JAKEL ALPHIN MRN: [id number] DOB/AGE: Sep 16, 1941 78 y.o.  Admit date: 02/22/2019 Discharge date: 02/25/2019   Procedures:  Procedure(s) (LRB): TOTAL KNEE ARTHROPLASTY (Left)  Attending Physician:  Dr. Esmond Plants  Admission Diagnoses:   Left knee  End stage OA  Discharge Diagnoses:  same   Past Medical History:  Diagnosis Date  . Allergic rhinitis   . Anticoagulant long-term use    xarelto  . Anxiety   . Benign localized prostatic hyperplasia with lower urinary tract symptoms (LUTS)   . Benzodiazepine dependence ([place])   . Edema of right lower extremity   . GERD (gastroesophageal reflux disease)   . Heart murmur   . Hematuria   . Hepatitis    History of  . History of colonic polyps   . History of CVA (cerebrovascular accident) without residual deficits 07/26/2017   crytogenic stroke -- acute left caudate body infarct   . History of kidney stones   . Hypertension   . Ischemic cardiomyopathy 07/26/2017   ef 35-40%;    TEE 07-31-2017 ef 60-65%  . MDD (major depressive disorder), recurrent, in partial remission ([place])   . OA (osteoarthritis)    knees,   . OSA (obstructive sleep apnea)    intolerant cpap  . PAF (paroxysmal atrial fibrillation) [facility])    cardiologist-  dr g. taylor  . Skin cancer    Childhood, right foot on toe  . Status post placement of implantable loop recorder 07/31/2017  . Stroke ([place])   . Swelling of right foot 12/2018  . Throat mass   . Wears glasses     PCP: [name], MD   Discharged Condition: good  Hospital Course:  Patient underwent the above stated procedure on 02/22/2019. Patient tolerated the procedure well and brought to the recovery room in good condition and subsequently to the floor. Patient had an uncomplicated hospital course and was stable for discharge. Continue Xarelto as prescribed. Pain medicine as needed. WBAT on the left leg    Disposition: Discharge disposition: 03-Skilled Nursing Facility      with follow up in 2 weeks   Follow-up Information    [name], MD. Call in 2 weeks.   Specialty:  Orthopedic Surgery Why:  [phone] Contact information: [address] [place] [address] [phone]           Discharge Instructions    Call MD / Call 911   Complete by:  As directed    If you experience chest pain or shortness of breath, CALL 911 and be transported to the hospital emergency room.  If you develope a fever above 101 F, pus (white drainage) or increased drainage or redness at the wound, or calf pain, call your surgeon's office.   Call MD / Call 911   Complete by:  As directed    If you experience chest pain or shortness of breath, CALL 911 and be transported to the hospital emergency room.  If you develope a fever above 101 F, pus (white drainage) or increased drainage or redness at the wound, or calf pain, call your surgeon's office.   Constipation Prevention   Complete by:  As directed    Drink plenty of fluids.  Prune juice may be helpful.  You may use a stool softener, such as Colace (over the counter) 100 mg twice a day.  Use MiraLax (over the counter) for  constipation as needed.   Constipation Prevention   Complete by:  As directed    Drink plenty of fluids.  Prune juice may be helpful.  You may use a stool softener, such as Colace (over the counter) 100 mg twice a day.  Use MiraLax (over the counter) for constipation as needed.   Diet - low sodium heart healthy   Complete by:  As directed    Diet - low sodium heart healthy   Complete by:  As directed    Increase activity slowly as tolerated   Complete by:  As directed    Increase activity slowly as tolerated   Complete by:  As directed       Allergies as of 02/25/2019      Reactions   Antihistamines, Diphenhydramine-type    Blood in urine    Aspirin    Blood in urine    Codeine Nausea And Vomiting    Must take with food   Pollen Extract Cough      Medication List    STOP taking these medications   diclofenac sodium 1 % Gel Commonly known as:  VOLTAREN     TAKE these medications   acetaminophen 325 MG tablet Commonly known as:  TYLENOL Take 325 mg by mouth every 6 (six) hours as needed for moderate pain or headache.   ALPRAZolam 1 MG tablet Commonly known as:  XANAX TAKE 0.5 TABLETS (0.5 MG TOTAL) BY MOUTH 3 (THREE) TIMES DAILY AS NEEDED FOR ANXIETY.   amLODipine 10 MG tablet Commonly known as:  NORVASC Take 1 tablet (10 mg total) by mouth daily.   buPROPion 150 MG 12 hr tablet Commonly known as:  WELLBUTRIN SR TAKE 1 TABLET BY MOUTH TWICE A DAY   carvedilol 6.25 MG tablet Commonly known as:  COREG Take 1 tablet (6.25 mg total) by mouth 2 (two) times daily with a meal.   dutasteride 0.5 MG capsule Commonly known as:  Avodart Take 1 capsule (0.5 mg total) by mouth daily.   ICY HOT EX Apply 1 application topically daily as needed (pain).   methocarbamol 500 MG tablet Commonly known as:  Robaxin Take 1 tablet (500 mg total) by mouth 3 (three) times daily as needed.   mirtazapine 15 MG tablet Commonly known as:  REMERON TAKE 1 TABLET BY MOUTH EVERYDAY AT BEDTIME What changed:  See the new instructions.   multivitamin with minerals Tabs tablet Take 1 tablet by mouth daily.   omeprazole 10 MG capsule Commonly known as:  PRILOSEC Take 1 capsule (10 mg total) by mouth daily.   oxyCODONE-acetaminophen 5-325 MG tablet Commonly known as:  Percocet Take 1 tablet by mouth every 6 (six) hours as needed for moderate pain or severe pain.   Restasis 0.05 % ophthalmic emulsion Generic drug:  cycloSPORINE Place 1 drop 2 (two) times daily into both eyes.   rivaroxaban 20 MG Tabs tablet Commonly known as:  Xarelto Take 1 tablet (20 mg total) by mouth daily with supper.   Systane Balance 0.6 % Soln Generic drug:  Propylene Glycol Place 1 drop into both eyes daily as  needed (dry eyes).   timolol 0.5 % ophthalmic solution Commonly known as:  TIMOPTIC Place 1 drop into both eyes 2 (two) times daily.   valsartan 80 MG tablet Commonly known as:  Diovan Take 1 tablet (80 mg total) by mouth daily.         Signed: Augustin Schooling 02/25/2019, 4:01 PM  [facility] is  now [facility]  [place] [address], [place], [place], [place] [address] Phone: [place]

## 2019-02-25 NOTE — Care Management Important Message (Signed)
Important Message  Patient Details IM Letter given to Velva Harman RN to present to the Patient Name: Jared [name] MRN: [id number] Date of Birth: [birth year]   Medicare Important Message Given:  Yes    Kerin Salen 02/25/2019, 11:25 AM

## 2019-02-25 NOTE — TOC Progression Note (Signed)
Transition of Care [facility]) - Progression Note    Patient Details  Name: Jared [name] MRN: [id number] Date of Birth: 1940/12/19  Transition of Care [facility]) CM/SW Contact  Leeroy Cha, RN Phone Number: 02/25/2019, 10:21 AM  Clinical Narrative:    Spoke with patient would like to go to [facility] for rehab.   Expected Discharge Plan: Skilled Nursing Facility Barriers to Discharge: Continued Medical Work up, Ship broker  Expected Discharge Plan and Services Expected Discharge Plan: [place]   Discharge Planning Services: CM Consult     Expected Discharge Date: 02/25/19                                     Social Determinants of Health (SDOH) Interventions    Readmission Risk Interventions No flowsheet data found.

## 2019-02-25 NOTE — Discharge Summary (Signed)
Orthopedic Discharge Summary        Physician Discharge Summary  Patient ID: Jared [name] MRN: [id number] DOB/AGE: [birth year] 76 y.o.  Admit date: 02/22/2019 Discharge date: 02/25/2019   Procedures:  Procedure(s) (LRB): TOTAL KNEE ARTHROPLASTY (Left)  Attending Physician:  Dr. Esmond Plants  Admission Diagnoses:   Left knee end stage osteoarthritis  Discharge Diagnoses:  Left knee end stage osteoarthritis   Past Medical History:  Diagnosis Date  . Allergic rhinitis   . Anticoagulant long-term use    xarelto  . Anxiety   . Benign localized prostatic hyperplasia with lower urinary tract symptoms (LUTS)   . Benzodiazepine dependence ([place])   . Edema of right lower extremity   . GERD (gastroesophageal reflux disease)   . Heart murmur   . Hematuria   . Hepatitis    History of  . History of colonic polyps   . History of CVA (cerebrovascular accident) without residual deficits 07/26/2017   crytogenic stroke -- acute left caudate body infarct   . History of kidney stones   . Hypertension   . Ischemic cardiomyopathy 07/26/2017   ef 35-40%;    TEE 07-31-2017 ef 60-65%  . MDD (major depressive disorder), recurrent, in partial remission ([place])   . OA (osteoarthritis)    knees,   . OSA (obstructive sleep apnea)    intolerant cpap  . PAF (paroxysmal atrial fibrillation) [facility])    cardiologist-  dr g. taylor  . Skin cancer    Childhood, right foot on toe  . Status post placement of implantable loop recorder 07/31/2017  . Stroke ([place])   . Swelling of right foot 12/2018  . Throat mass   . Wears glasses     PCP: [name], MD   Discharged Condition: fair  Hospital Course:  Patient underwent the above stated procedure on 02/22/2019. Patient tolerated the procedure well and brought to the recovery room in good condition and subsequently to the floor. Patient had an uncomplicated hospital course and was stable for discharge.   Disposition: Discharge disposition:  03-Skilled Nursing Facility      with follow up in 2 weeks   Follow-up Information    [name], MD. Call in 2 weeks.   Specialty:  Orthopedic Surgery Why:  [phone] Contact information: [address] [place] [address] [phone]           Discharge Instructions    Call MD / Call 911   Complete by:  As directed    If you experience chest pain or shortness of breath, CALL 911 and be transported to the hospital emergency room.  If you develope a fever above 101 F, pus (white drainage) or increased drainage or redness at the wound, or calf pain, call your surgeon's office.   Constipation Prevention   Complete by:  As directed    Drink plenty of fluids.  Prune juice may be helpful.  You may use a stool softener, such as Colace (over the counter) 100 mg twice a day.  Use MiraLax (over the counter) for constipation as needed.   Diet - low sodium heart healthy   Complete by:  As directed    Increase activity slowly as tolerated   Complete by:  As directed       Allergies as of 02/25/2019      Reactions   Antihistamines, Diphenhydramine-type    Blood in urine    Aspirin    Blood in urine    Codeine  Nausea And Vomiting   Must take with food   Pollen Extract Cough      Medication List    STOP taking these medications   diclofenac sodium 1 % Gel Commonly known as:  VOLTAREN     TAKE these medications   acetaminophen 325 MG tablet Commonly known as:  TYLENOL Take 325 mg by mouth every 6 (six) hours as needed for moderate pain or headache.   ALPRAZolam 1 MG tablet Commonly known as:  XANAX TAKE 0.5 TABLETS (0.5 MG TOTAL) BY MOUTH 3 (THREE) TIMES DAILY AS NEEDED FOR ANXIETY.   amLODipine 10 MG tablet Commonly known as:  NORVASC Take 1 tablet (10 mg total) by mouth daily.   buPROPion 150 MG 12 hr tablet Commonly known as:  WELLBUTRIN SR TAKE 1 TABLET BY MOUTH TWICE A DAY   carvedilol 6.25 MG tablet Commonly known as:  COREG Take 1  tablet (6.25 mg total) by mouth 2 (two) times daily with a meal.   dutasteride 0.5 MG capsule Commonly known as:  Avodart Take 1 capsule (0.5 mg total) by mouth daily.   ICY HOT EX Apply 1 application topically daily as needed (pain).   methocarbamol 500 MG tablet Commonly known as:  Robaxin Take 1 tablet (500 mg total) by mouth 3 (three) times daily as needed.   mirtazapine 15 MG tablet Commonly known as:  REMERON TAKE 1 TABLET BY MOUTH EVERYDAY AT BEDTIME What changed:  See the new instructions.   multivitamin with minerals Tabs tablet Take 1 tablet by mouth daily.   omeprazole 10 MG capsule Commonly known as:  PRILOSEC Take 1 capsule (10 mg total) by mouth daily.   oxyCODONE-acetaminophen 5-325 MG tablet Commonly known as:  Percocet Take 1 tablet by mouth every 4 (four) hours as needed for moderate pain or severe pain.   Restasis 0.05 % ophthalmic emulsion Generic drug:  cycloSPORINE Place 1 drop 2 (two) times daily into both eyes.   rivaroxaban 20 MG Tabs tablet Commonly known as:  Xarelto Take 1 tablet (20 mg total) by mouth daily with supper.   Systane Balance 0.6 % Soln Generic drug:  Propylene Glycol Place 1 drop into both eyes daily as needed (dry eyes).   timolol 0.5 % ophthalmic solution Commonly known as:  TIMOPTIC Place 1 drop into both eyes 2 (two) times daily.   valsartan 80 MG tablet Commonly known as:  Diovan Take 1 tablet (80 mg total) by mouth daily.         Signed: Ventura Bruns 02/25/2019, 8:14 AM  [facility] is now [employer] [address] [place], [place], [place] [address] Phone: [place]

## 2019-02-25 NOTE — Progress Notes (Signed)
   Subjective: 3 Days Post-Op Procedure(s) (LRB): TOTAL KNEE ARTHROPLASTY (Left)  Pt c/o mild to moderate pain in the knee Plan for SNF placement Denies any new symptoms or issues Therapy going fair Patient reports pain as moderate.  Objective:   VITALS:   Vitals:   02/24/19 2155 02/25/19 0559  BP: (!) 156/73 139/70  Pulse: 78 82  Resp: 18 18  Temp: 99.7 F (37.6 C) 97.8 F (36.6 C)  SpO2: 94% 91%    Left knee incision healing well Dressing in place nv intact distally No rashes or edema  LABS Recent Labs    02/23/19 0355 02/24/19 0337  HGB 12.9* 13.3  HCT 37.8* 40.2  WBC 12.9* 13.0*  PLT 193 202    Recent Labs    02/23/19 0355  NA 138  K 4.1  BUN 19  CREATININE 1.02  GLUCOSE 166*     Assessment/Plan: 3 Days Post-Op Procedure(s) (LRB): TOTAL KNEE ARTHROPLASTY (Left) PT/OT Plan to d/c to SNF once bed available Pt is stable to d/c today Pain management as needed  Will continue to monitor his progress    Merla Riches PA-C, [place] is now [employer] [address], [address], [place], [place] [address] Phone: [phone] [email or web address] Facebook  [employer]

## 2019-02-25 NOTE — TOC Progression Note (Addendum)
Transition of Care [facility]) - Progression Note    Patient Details  Name: Jared [name] MRN: [id number] Date of Birth: 05/28/1941  Transition of Care [facility]) CM/SW Contact  Leeroy Cha, RN Phone Number: 02/25/2019, 10:54 AM  Clinical Narrative:    Patient has chosen [facility] for rehab.  [name] at [phone] message left to please return call. 1216/tct [name] closs at [facility] a bed can come after 3 pm today wuill arrange transport  Expected Discharge Plan: [place] Barriers to Discharge: Continued Medical Work up, Ship broker  Expected Discharge Plan and Services Expected Discharge Plan: [place]   Discharge Planning Services: CM Consult     Expected Discharge Date: 02/25/19                                     Social Determinants of Health (SDOH) Interventions    Readmission Risk Interventions No flowsheet data found.

## 2019-02-26 ENCOUNTER — Encounter (HOSPITAL_COMMUNITY): Payer: Self-pay | Admitting: Emergency Medicine

## 2019-02-26 ENCOUNTER — Emergency Department (HOSPITAL_COMMUNITY): Payer: PPO

## 2019-02-26 ENCOUNTER — Inpatient Hospital Stay (HOSPITAL_COMMUNITY)
Admission: EM | Admit: 2019-02-26 | Discharge: 2019-03-05 | DRG: 481 | Disposition: A | Discharge status: Skilled Nursing Facility | Payer: PPO | Source: Skilled Nursing Facility | Attending: Internal Medicine | Admitting: Internal Medicine

## 2019-02-26 ENCOUNTER — Other Ambulatory Visit: Payer: Self-pay

## 2019-02-26 DIAGNOSIS — Z8673 Personal history of transient ischemic attack (TIA), and cerebral infarction without residual deficits: Secondary | ICD-10-CM

## 2019-02-26 DIAGNOSIS — Z885 Allergy status to narcotic agent status: Secondary | ICD-10-CM

## 2019-02-26 DIAGNOSIS — S72141D Displaced intertrochanteric fracture of right femur, subsequent encounter for closed fracture with routine healing: Secondary | ICD-10-CM | POA: Diagnosis not present

## 2019-02-26 DIAGNOSIS — Z9049 Acquired absence of other specified parts of digestive tract: Secondary | ICD-10-CM | POA: Diagnosis not present

## 2019-02-26 DIAGNOSIS — S72144A Nondisplaced intertrochanteric fracture of right femur, initial encounter for closed fracture: Secondary | ICD-10-CM

## 2019-02-26 DIAGNOSIS — M109 Gout, unspecified: Secondary | ICD-10-CM | POA: Diagnosis present

## 2019-02-26 DIAGNOSIS — S72041A Displaced fracture of base of neck of right femur, initial encounter for closed fracture: Secondary | ICD-10-CM | POA: Diagnosis not present

## 2019-02-26 DIAGNOSIS — Z7901 Long term (current) use of anticoagulants: Secondary | ICD-10-CM

## 2019-02-26 DIAGNOSIS — Z886 Allergy status to analgesic agent status: Secondary | ICD-10-CM | POA: Diagnosis not present

## 2019-02-26 DIAGNOSIS — W1830XA Fall on same level, unspecified, initial encounter: Secondary | ICD-10-CM | POA: Diagnosis present

## 2019-02-26 DIAGNOSIS — K5900 Constipation, unspecified: Secondary | ICD-10-CM | POA: Diagnosis not present

## 2019-02-26 DIAGNOSIS — S299XXA Unspecified injury of thorax, initial encounter: Secondary | ICD-10-CM | POA: Diagnosis not present

## 2019-02-26 DIAGNOSIS — G4733 Obstructive sleep apnea (adult) (pediatric): Secondary | ICD-10-CM | POA: Diagnosis present

## 2019-02-26 DIAGNOSIS — Z9841 Cataract extraction status, right eye: Secondary | ICD-10-CM | POA: Diagnosis not present

## 2019-02-26 DIAGNOSIS — I509 Heart failure, unspecified: Secondary | ICD-10-CM | POA: Diagnosis not present

## 2019-02-26 DIAGNOSIS — I10 Essential (primary) hypertension: Secondary | ICD-10-CM | POA: Diagnosis present

## 2019-02-26 DIAGNOSIS — S72141A Displaced intertrochanteric fracture of right femur, initial encounter for closed fracture: Principal | ICD-10-CM | POA: Diagnosis present

## 2019-02-26 DIAGNOSIS — M25562 Pain in left knee: Secondary | ICD-10-CM | POA: Diagnosis not present

## 2019-02-26 DIAGNOSIS — S72101A Unspecified trochanteric fracture of right femur, initial encounter for closed fracture: Secondary | ICD-10-CM | POA: Diagnosis not present

## 2019-02-26 DIAGNOSIS — N401 Enlarged prostate with lower urinary tract symptoms: Secondary | ICD-10-CM

## 2019-02-26 DIAGNOSIS — I4891 Unspecified atrial fibrillation: Secondary | ICD-10-CM | POA: Diagnosis not present

## 2019-02-26 DIAGNOSIS — Z85828 Personal history of other malignant neoplasm of skin: Secondary | ICD-10-CM | POA: Diagnosis not present

## 2019-02-26 DIAGNOSIS — Z7401 Bed confinement status: Secondary | ICD-10-CM

## 2019-02-26 DIAGNOSIS — W19XXXA Unspecified fall, initial encounter: Secondary | ICD-10-CM

## 2019-02-26 DIAGNOSIS — I110 Hypertensive heart disease with heart failure: Secondary | ICD-10-CM | POA: Diagnosis not present

## 2019-02-26 DIAGNOSIS — S0990XA Unspecified injury of head, initial encounter: Secondary | ICD-10-CM | POA: Diagnosis not present

## 2019-02-26 DIAGNOSIS — F419 Anxiety disorder, unspecified: Secondary | ICD-10-CM | POA: Diagnosis present

## 2019-02-26 DIAGNOSIS — R000 Tachycardia, unspecified: Secondary | ICD-10-CM | POA: Diagnosis not present

## 2019-02-26 DIAGNOSIS — F1320 Sedative, hypnotic or anxiolytic dependence, uncomplicated: Secondary | ICD-10-CM | POA: Diagnosis present

## 2019-02-26 DIAGNOSIS — F3341 Major depressive disorder, recurrent, in partial remission: Secondary | ICD-10-CM | POA: Diagnosis not present

## 2019-02-26 DIAGNOSIS — S199XXA Unspecified injury of neck, initial encounter: Secondary | ICD-10-CM | POA: Diagnosis not present

## 2019-02-26 DIAGNOSIS — Z96652 Presence of left artificial knee joint: Secondary | ICD-10-CM | POA: Diagnosis not present

## 2019-02-26 DIAGNOSIS — I480 Paroxysmal atrial fibrillation: Secondary | ICD-10-CM | POA: Diagnosis not present

## 2019-02-26 DIAGNOSIS — Z03818 Encounter for observation for suspected exposure to other biological agents ruled out: Secondary | ICD-10-CM | POA: Diagnosis not present

## 2019-02-26 DIAGNOSIS — R0902 Hypoxemia: Secondary | ICD-10-CM | POA: Diagnosis not present

## 2019-02-26 DIAGNOSIS — M6281 Muscle weakness (generalized): Secondary | ICD-10-CM | POA: Diagnosis not present

## 2019-02-26 DIAGNOSIS — N400 Enlarged prostate without lower urinary tract symptoms: Secondary | ICD-10-CM | POA: Diagnosis not present

## 2019-02-26 DIAGNOSIS — K219 Gastro-esophageal reflux disease without esophagitis: Secondary | ICD-10-CM | POA: Diagnosis present

## 2019-02-26 DIAGNOSIS — S7291XA Unspecified fracture of right femur, initial encounter for closed fracture: Secondary | ICD-10-CM | POA: Diagnosis present

## 2019-02-26 DIAGNOSIS — R079 Chest pain, unspecified: Secondary | ICD-10-CM | POA: Diagnosis not present

## 2019-02-26 DIAGNOSIS — Z8249 Family history of ischemic heart disease and other diseases of the circulatory system: Secondary | ICD-10-CM

## 2019-02-26 DIAGNOSIS — S72001A Fracture of unspecified part of neck of right femur, initial encounter for closed fracture: Secondary | ICD-10-CM | POA: Insufficient documentation

## 2019-02-26 DIAGNOSIS — R52 Pain, unspecified: Secondary | ICD-10-CM | POA: Diagnosis not present

## 2019-02-26 DIAGNOSIS — Z888 Allergy status to other drugs, medicaments and biological substances status: Secondary | ICD-10-CM

## 2019-02-26 DIAGNOSIS — Z1159 Encounter for screening for other viral diseases: Secondary | ICD-10-CM | POA: Diagnosis not present

## 2019-02-26 DIAGNOSIS — Z79899 Other long term (current) drug therapy: Secondary | ICD-10-CM

## 2019-02-26 DIAGNOSIS — S8992XA Unspecified injury of left lower leg, initial encounter: Secondary | ICD-10-CM | POA: Diagnosis not present

## 2019-02-26 DIAGNOSIS — Z961 Presence of intraocular lens: Secondary | ICD-10-CM | POA: Diagnosis present

## 2019-02-26 DIAGNOSIS — I517 Cardiomegaly: Secondary | ICD-10-CM

## 2019-02-26 DIAGNOSIS — I444 Left anterior fascicular block: Secondary | ICD-10-CM

## 2019-02-26 DIAGNOSIS — Z9842 Cataract extraction status, left eye: Secondary | ICD-10-CM | POA: Diagnosis not present

## 2019-02-26 DIAGNOSIS — M2550 Pain in unspecified joint: Secondary | ICD-10-CM | POA: Diagnosis not present

## 2019-02-26 DIAGNOSIS — Z79891 Long term (current) use of opiate analgesic: Secondary | ICD-10-CM

## 2019-02-26 DIAGNOSIS — Z800 Family history of malignant neoplasm of digestive organs: Secondary | ICD-10-CM

## 2019-02-26 DIAGNOSIS — I639 Cerebral infarction, unspecified: Secondary | ICD-10-CM | POA: Diagnosis present

## 2019-02-26 DIAGNOSIS — M25551 Pain in right hip: Secondary | ICD-10-CM | POA: Diagnosis not present

## 2019-02-26 DIAGNOSIS — Z87442 Personal history of urinary calculi: Secondary | ICD-10-CM | POA: Diagnosis not present

## 2019-02-26 DIAGNOSIS — Z8719 Personal history of other diseases of the digestive system: Secondary | ICD-10-CM

## 2019-02-26 DIAGNOSIS — Z419 Encounter for procedure for purposes other than remedying health state, unspecified: Secondary | ICD-10-CM

## 2019-02-26 DIAGNOSIS — R278 Other lack of coordination: Secondary | ICD-10-CM | POA: Diagnosis not present

## 2019-02-26 DIAGNOSIS — R2689 Other abnormalities of gait and mobility: Secondary | ICD-10-CM | POA: Diagnosis not present

## 2019-02-26 DIAGNOSIS — R5381 Other malaise: Secondary | ICD-10-CM | POA: Diagnosis not present

## 2019-02-26 HISTORY — DX: Left anterior fascicular block: I44.4

## 2019-02-26 HISTORY — DX: Cardiomegaly: I51.7

## 2019-02-26 LAB — CBC WITH DIFFERENTIAL/PLATELET
Abs Immature Granulocytes: 0.09 10*3/uL — ABNORMAL HIGH (ref 0.00–0.07)
Basophils Absolute: 0 10*3/uL (ref 0.0–0.1)
Basophils Relative: 0 %
Eosinophils Absolute: 0.1 10*3/uL (ref 0.0–0.5)
Eosinophils Relative: 1 %
HCT: 36.3 % — ABNORMAL LOW (ref 39.0–52.0)
Hemoglobin: 12.3 g/dL — ABNORMAL LOW (ref 13.0–17.0)
Immature Granulocytes: 1 %
Lymphocytes Relative: 15 %
Lymphs Abs: 1.5 10*3/uL (ref 0.7–4.0)
MCH: 30.9 pg (ref 26.0–34.0)
MCHC: 33.9 g/dL (ref 30.0–36.0)
MCV: 91.2 fL (ref 80.0–100.0)
Monocytes Absolute: 1.3 10*3/uL — ABNORMAL HIGH (ref 0.1–1.0)
Monocytes Relative: 12 %
Neutro Abs: 7.6 10*3/uL (ref 1.7–7.7)
Neutrophils Relative %: 71 %
Platelets: 201 10*3/uL (ref 150–400)
RBC: 3.98 MIL/uL — ABNORMAL LOW (ref 4.22–5.81)
RDW: 12 % (ref 11.5–15.5)
WBC: 10.6 10*3/uL — ABNORMAL HIGH (ref 4.0–10.5)
nRBC: 0 % (ref 0.0–0.2)

## 2019-02-26 LAB — CBC
HCT: 33.4 % — ABNORMAL LOW (ref 39.0–52.0)
Hemoglobin: 11.6 g/dL — ABNORMAL LOW (ref 13.0–17.0)
MCH: 31.2 pg (ref 26.0–34.0)
MCHC: 34.7 g/dL (ref 30.0–36.0)
MCV: 89.8 fL (ref 80.0–100.0)
Platelets: 191 10*3/uL (ref 150–400)
RBC: 3.72 MIL/uL — ABNORMAL LOW (ref 4.22–5.81)
RDW: 12.1 % (ref 11.5–15.5)
WBC: 11.2 10*3/uL — ABNORMAL HIGH (ref 4.0–10.5)
nRBC: 0 % (ref 0.0–0.2)

## 2019-02-26 LAB — BASIC METABOLIC PANEL
Anion gap: 10 (ref 5–15)
Anion gap: 8 (ref 5–15)
BUN: 25 mg/dL — ABNORMAL HIGH (ref 8–23)
BUN: 24 mg/dL — ABNORMAL HIGH (ref 8–23)
CO2: 23 mmol/L (ref 22–32)
CO2: 26 mmol/L (ref 22–32)
Calcium: 8.8 mg/dL — ABNORMAL LOW (ref 8.9–10.3)
Calcium: 8.8 mg/dL — ABNORMAL LOW (ref 8.9–10.3)
Chloride: 104 mmol/L (ref 98–111)
Chloride: 105 mmol/L (ref 98–111)
Creatinine, Ser: 1.02 mg/dL (ref 0.61–1.24)
Creatinine, Ser: 1.06 mg/dL (ref 0.61–1.24)
GFR calc Af Amer: >60 mL/min (ref >60)
GFR calc Af Amer: >60 mL/min (ref >60)
GFR calc non Af Amer: >60 mL/min (ref >60)
GFR calc non Af Amer: >60 mL/min (ref >60)
Glucose, Bld: 149 mg/dL — ABNORMAL HIGH (ref 70–99)
Glucose, Bld: 143 mg/dL — ABNORMAL HIGH (ref 70–99)
Potassium: 3.8 mmol/L (ref 3.5–5.1)
Potassium: 4.2 mmol/L (ref 3.5–5.1)
Sodium: 137 mmol/L (ref 135–145)
Sodium: 139 mmol/L (ref 135–145)

## 2019-02-26 LAB — APTT: aPTT: 45 seconds — ABNORMAL HIGH (ref 24–36)

## 2019-02-26 LAB — TYPE AND SCREEN
ABO/RH(D): O POS
Antibody Screen: NEGATIVE

## 2019-02-26 LAB — ABO/RH: ABO/RH(D): O POS

## 2019-02-26 LAB — SARS CORONAVIRUS 2 BY RT PCR (HOSPITAL ORDER, PERFORMED IN ~~LOC~~ HOSPITAL LAB): SARS Coronavirus 2: NEGATIVE

## 2019-02-26 LAB — PROTIME-INR
INR: 2 — ABNORMAL HIGH (ref 0.8–1.2)
Prothrombin Time: 22.2 seconds — ABNORMAL HIGH (ref 11.4–15.2)

## 2019-02-26 MED ORDER — BUPROPION HCL ER (SR) 150 MG PO TB12
150.0000 mg | ORAL_TABLET | Freq: Two times a day (BID) | ORAL | Status: DC
  Administered 2019-02-26 – 2019-03-05 (×15): 150 mg via ORAL
  Filled 2019-02-26 (×15): qty 1

## 2019-02-26 MED ORDER — ACETAMINOPHEN 325 MG PO TABS
650.0000 mg | ORAL_TABLET | Freq: Four times a day (QID) | ORAL | Status: DC | PRN
  Administered 2019-02-26: 650 mg via ORAL
  Filled 2019-02-26: qty 2

## 2019-02-26 MED ORDER — OXYCODONE-ACETAMINOPHEN 5-325 MG PO TABS
1.0000 | ORAL_TABLET | ORAL | Status: DC | PRN
  Administered 2019-02-26 – 2019-03-01 (×4): 1 via ORAL
  Filled 2019-02-26 (×4): qty 1

## 2019-02-26 MED ORDER — HYDRALAZINE HCL 20 MG/ML IJ SOLN: 5.0000 mg | INTRAMUSCULAR | Status: DC | PRN

## 2019-02-26 MED ORDER — SENNOSIDES-DOCUSATE SODIUM 8.6-50 MG PO TABS: 1.0000 | ORAL_TABLET | Freq: Every evening | ORAL | Status: DC | PRN

## 2019-02-26 MED ORDER — MIRTAZAPINE 15 MG PO TABS
15.0000 mg | ORAL_TABLET | Freq: Every day | ORAL | Status: DC
  Administered 2019-02-26 – 2019-03-04 (×7): 15 mg via ORAL
  Filled 2019-02-26 (×7): qty 1

## 2019-02-26 MED ORDER — ONDANSETRON HCL 4 MG/2ML IJ SOLN
4.0000 mg | Freq: Once | INTRAMUSCULAR | Status: AC
  Administered 2019-02-26: 4 mg via INTRAVENOUS
  Filled 2019-02-26: qty 2

## 2019-02-26 MED ORDER — TIMOLOL MALEATE 0.5 % OP SOLN
1.0000 [drp] | Freq: Two times a day (BID) | OPHTHALMIC | Status: DC
  Administered 2019-02-26 – 2019-03-05 (×15): 1 [drp] via OPHTHALMIC
  Filled 2019-02-26 (×3): qty 5

## 2019-02-26 MED ORDER — AMLODIPINE BESYLATE 10 MG PO TABS
10.0000 mg | ORAL_TABLET | Freq: Every day | ORAL | Status: DC
  Administered 2019-02-26 – 2019-03-05 (×8): 10 mg via ORAL
  Filled 2019-02-26 (×8): qty 1

## 2019-02-26 MED ORDER — CARVEDILOL 3.125 MG PO TABS
3.1250 mg | ORAL_TABLET | Freq: Two times a day (BID) | ORAL | Status: DC
  Administered 2019-02-26 – 2019-03-05 (×13): 3.125 mg via ORAL
  Filled 2019-02-26 (×14): qty 1

## 2019-02-26 MED ORDER — ONDANSETRON HCL 4 MG/2ML IJ SOLN: 4.0000 mg | Freq: Four times a day (QID) | INTRAMUSCULAR | Status: DC | PRN

## 2019-02-26 MED ORDER — FENTANYL CITRATE (PF) 100 MCG/2ML IJ SOLN
50.0000 ug | Freq: Once | INTRAMUSCULAR | Status: AC
  Administered 2019-02-26: 02:00:00 50 ug via INTRAVENOUS
  Filled 2019-02-26: qty 2

## 2019-02-26 MED ORDER — METHOCARBAMOL 500 MG PO TABS
500.0000 mg | ORAL_TABLET | Freq: Three times a day (TID) | ORAL | Status: DC | PRN
  Administered 2019-02-26 – 2019-03-04 (×11): 500 mg via ORAL
  Filled 2019-02-26 (×12): qty 1

## 2019-02-26 MED ORDER — DUTASTERIDE 0.5 MG PO CAPS
0.5000 mg | ORAL_CAPSULE | Freq: Every day | ORAL | Status: DC
  Administered 2019-02-26 – 2019-03-05 (×8): 0.5 mg via ORAL
  Filled 2019-02-26 (×8): qty 1

## 2019-02-26 MED ORDER — CYCLOSPORINE 0.05 % OP EMUL
1.0000 [drp] | Freq: Two times a day (BID) | OPHTHALMIC | Status: DC
  Administered 2019-02-26 – 2019-03-05 (×15): 1 [drp] via OPHTHALMIC
  Filled 2019-02-26 (×18): qty 30

## 2019-02-26 MED ORDER — ACETAMINOPHEN 650 MG RE SUPP: 650.0000 mg | Freq: Four times a day (QID) | RECTAL | Status: DC | PRN

## 2019-02-26 MED ORDER — ONDANSETRON HCL 4 MG PO TABS: 4.0000 mg | ORAL_TABLET | Freq: Four times a day (QID) | ORAL | Status: DC | PRN

## 2019-02-26 MED ORDER — PANTOPRAZOLE SODIUM 40 MG PO TBEC
40.0000 mg | DELAYED_RELEASE_TABLET | Freq: Every day | ORAL | Status: DC
  Administered 2019-02-26 – 2019-03-05 (×8): 40 mg via ORAL
  Filled 2019-02-26 (×8): qty 1

## 2019-02-26 MED ORDER — ADULT MULTIVITAMIN W/MINERALS CH
1.0000 | ORAL_TABLET | Freq: Every day | ORAL | Status: DC
  Administered 2019-02-26 – 2019-03-05 (×8): 1 via ORAL
  Filled 2019-02-26 (×8): qty 1

## 2019-02-26 MED ORDER — MORPHINE SULFATE (PF) 2 MG/ML IV SOLN
2.0000 mg | INTRAVENOUS | Status: DC | PRN
  Administered 2019-02-26 (×3): 2 mg via INTRAVENOUS
  Filled 2019-02-26 (×3): qty 1

## 2019-02-26 MED ORDER — SODIUM CHLORIDE 0.9 % IV SOLN
INTRAVENOUS | Status: DC
  Administered 2019-02-26: 23:00:00 via INTRAVENOUS

## 2019-02-26 MED ORDER — ALPRAZOLAM 0.5 MG PO TABS: 0.5000 mg | ORAL_TABLET | Freq: Three times a day (TID) | ORAL | Status: DC | PRN

## 2019-02-26 MED ORDER — POLYVINYL ALCOHOL 1.4 % OP SOLN
1.0000 [drp] | Freq: Every day | OPHTHALMIC | Status: DC | PRN
  Filled 2019-02-26: qty 15

## 2019-02-26 NOTE — ED Triage Notes (Addendum)
Pt presents from [facility]. Had knee replacement on the left on 6/5 and was sent there tonight to receive rehab. Fell from standing and injured his right hip. Rotated and shortened. Pt is on xarelto for afib. Given 200 fentanyl en route

## 2019-02-26 NOTE — Progress Notes (Signed)
This RN called pt's daughter, Eustaquio Maize, to inform that pt would transfer to [facility] today. All questions answered to satisfaction.

## 2019-02-26 NOTE — H&P (View-Only) (Signed)
Reason for Consult:Right hip fracture Referring Physician: EDP  Jared [name] is an 78 y.o. male.  HPI: 78 yo male who is s/p left total knee replacement 5 days ago who was transferred today to a SNF from [facility].  He reports trying to get up to turn off a light switch and losing his balance falling and breaking his right hip. He complained of severe right hip pain and was unable to stand.  He was transported to [facility] ED for further eval and treatment.   Past Medical History:  Diagnosis Date  . Allergic rhinitis   . Anticoagulant long-term use    xarelto  . Anxiety   . Benign localized prostatic hyperplasia with lower urinary tract symptoms (LUTS)   . Benzodiazepine dependence ([place])   . Edema of right lower extremity   . GERD (gastroesophageal reflux disease)   . Heart murmur   . Hematuria   . Hepatitis    History of  . History of colonic polyps   . History of CVA (cerebrovascular accident) without residual deficits 07/26/2017   crytogenic stroke -- acute left caudate body infarct   . History of kidney stones   . Hypertension   . Ischemic cardiomyopathy 07/26/2017   ef 35-40%;    TEE 07-31-2017 ef 60-65%  . MDD (major depressive disorder), recurrent, in partial remission ([place])   . OA (osteoarthritis)    knees,   . OSA (obstructive sleep apnea)    intolerant cpap  . PAF (paroxysmal atrial fibrillation) [facility])    cardiologist-  dr g. taylor  . Skin cancer    Childhood, right foot on toe  . Status post placement of implantable loop recorder 07/31/2017  . Stroke ([place])   . Swelling of right foot 12/2018  . Throat mass   . Wears glasses     Past Surgical History:  Procedure Laterality Date  . APPENDECTOMY  1970s  . CATARACT EXTRACTION W/ INTRAOCULAR LENS  IMPLANT, BILATERAL Bilateral 2014  . CHOLECYSTECTOMY  1970s  . COLONOSCOPY    . CYSTOSCOPY    . FOOT SURGERY Left yrs ago  . HERNIA REPAIR  2014  . LOOP RECORDER INSERTION N/A 07/31/2017   Procedure: LOOP  RECORDER INSERTION;  Surgeon: Evans Lance, MD;  Location: [place] [facility];  Service: Cardiovascular;  Laterality: N/A;  . TEE WITHOUT CARDIOVERSION N/A 07/31/2017   Procedure: TRANSESOPHAGEAL ECHOCARDIOGRAM (TEE) WITH LOOP;  Surgeon: Dorothy Spark, MD;  Location: [place];  Service: Cardiovascular;  Laterality: N/A;  . TOTAL KNEE ARTHROPLASTY Left 02/22/2019   Procedure: TOTAL KNEE ARTHROPLASTY;  Surgeon: [name], MD;  Location: [facility];  Service: Orthopedics;  Laterality: Left;  . WRIST FRACTURE SURGERY  1970s   right x2  unsure if any hardware remain   ;    left x1 with pinning then removed    Family History  Problem Relation Age of Onset  . Heart disease Mother   . Heart disease Father   . Colon cancer Maternal Aunt 70    Social History:  reports that he has never smoked. He has never used smokeless tobacco. He reports that he does not drink alcohol or use drugs.  Allergies:  Allergies  Allergen Reactions  . Antihistamines, Diphenhydramine-Type     Blood in urine   . Aspirin     Blood in urine   . Codeine Nausea And Vomiting    Must take with food  . Pollen Extract Cough    Medications:  I have reviewed the patient's current medications.  Results for orders placed or performed during the hospital encounter of 02/26/19 (from the past 48 hour(s))  CBC with Differential/Platelet     Status: Abnormal   Collection Time: 02/26/19 12:40 AM  Result Value Ref Range   WBC 10.6 (H) 4.0 - 10.5 K/uL   RBC 3.98 (L) 4.22 - 5.81 MIL/uL   Hemoglobin 12.3 (L) 13.0 - 17.0 g/dL   HCT 36.3 (L) 39.0 - 52.0 %   MCV 91.2 80.0 - 100.0 fL   MCH 30.9 26.0 - 34.0 pg   MCHC 33.9 30.0 - 36.0 g/dL   RDW 12.0 11.5 - 15.5 %   Platelets 201 150 - 400 K/uL   nRBC 0.0 0.0 - 0.2 %   Neutrophils Relative % 71 %   Neutro Abs 7.6 1.7 - 7.7 K/uL   Lymphocytes Relative 15 %   Lymphs Abs 1.5 0.7 - 4.0 K/uL   Monocytes Relative 12 %   Monocytes Absolute 1.3 (H) 0.1 - 1.0 K/uL    Eosinophils Relative 1 %   Eosinophils Absolute 0.1 0.0 - 0.5 K/uL   Basophils Relative 0 %   Basophils Absolute 0.0 0.0 - 0.1 K/uL   Immature Granulocytes 1 %   Abs Immature Granulocytes 0.09 (H) 0.00 - 0.07 K/uL    Comment: Performed at [facility], [address] [place], [place] [address]  Basic metabolic panel     Status: Abnormal   Collection Time: 02/26/19 12:40 AM  Result Value Ref Range   Sodium 137 135 - 145 mmol/L   Potassium 3.8 3.5 - 5.1 mmol/L   Chloride 104 98 - 111 mmol/L   CO2 23 22 - 32 mmol/L   Glucose, Bld 149 (H) 70 - 99 mg/dL   BUN 25 (H) 8 - 23 mg/dL   Creatinine, Ser 1.02 0.61 - 1.24 mg/dL   Calcium 8.8 (L) 8.9 - 10.3 mg/dL   GFR calc non Af Amer >60 >60 mL/min   GFR calc Af Amer >60 >60 mL/min   Anion gap 10 5 - 15    Comment: Performed at [facility], [place] [address] [place], [place] [address]  SARS Coronavirus 2 (CEPHEID - Performed in [place] hospital lab), Hosp Order     Status: None   Collection Time: 02/26/19  1:58 AM  Result Value Ref Range   SARS Coronavirus 2 NEGATIVE NEGATIVE    Comment: (NOTE) If result is NEGATIVE SARS-CoV-2 target nucleic acids are NOT DETECTED. The SARS-CoV-2 RNA is generally detectable in upper and lower  respiratory specimens during the acute phase of infection. The lowest  concentration of SARS-CoV-2 viral copies this assay can detect is 250  copies / mL. A negative result does not preclude SARS-CoV-2 infection  and should not be used as the sole basis for treatment or other  patient management decisions.  A negative result may occur with  improper specimen collection / handling, submission of specimen other  than nasopharyngeal swab, presence of viral mutation(s) within the  areas targeted by this assay, and inadequate number of viral copies  (<250 copies / mL). A negative result must be combined with clinical  observations, patient history, and epidemiological information. If result is  POSITIVE SARS-CoV-2 target nucleic acids are DETECTED. The SARS-CoV-2 RNA is generally detectable in upper and lower  respiratory specimens dur ing the acute phase of infection.  Positive  results are indicative of active infection with SARS-CoV-2.  Clinical  correlation with patient history and other diagnostic information is  necessary to determine patient infection status.  Positive results do  not rule out bacterial infection or co-infection with other viruses. If result is PRESUMPTIVE POSTIVE SARS-CoV-2 nucleic acids MAY BE PRESENT.   A presumptive positive result was obtained on the submitted specimen  and confirmed on repeat testing.  While 2019 novel coronavirus  (SARS-CoV-2) nucleic acids may be present in the submitted sample  additional confirmatory testing may be necessary for epidemiological  and / or clinical management purposes  to differentiate between  SARS-CoV-2 and other Sarbecovirus currently known to infect humans.  If clinically indicated additional testing with an alternate test  methodology [phone]) is advised. The SARS-CoV-2 RNA is generally  detectable in upper and lower respiratory sp ecimens during the acute  phase of infection. The expected result is Negative. Fact Sheet for Patients:  StrictlyIdeas.no Fact Sheet for Healthcare Providers: BankingDealers.co.za This test is not yet approved or cleared by the [place] FDA and has been authorized for detection and/or diagnosis of SARS-CoV-2 by FDA under an Emergency Use Authorization (EUA).  This EUA will remain in effect (meaning this test can be used) for the duration of the COVID-19 declaration under Section 564(b)(1) of the Act, 21 U.S.C. section 360bbb-3(b)(1), unless the authorization is terminated or revoked sooner. Performed at [facility], [place] [address], [place], [place] [address]     Dg Chest 1 View  Result Date: 02/26/2019 CLINICAL  DATA:  Pain status post fall. EXAM: CHEST  1 VIEW COMPARISON:  None. FINDINGS: The heart size is enlarged. The patient is status post placement of a leadless pacemaker. There is no pneumothorax. No large pleural effusion. The lungs are essentially clear. There is no evidence of an acute osseous abnormality. Aortic calcifications are noted. Pleuroparenchymal scarring is noted at the lung apices. IMPRESSION: No active disease. Electronically Signed   By: Constance Holster M.D.   On: 02/26/2019 01:39   Ct Head Wo Contrast  Result Date: 02/26/2019 CLINICAL DATA:  Head trauma. EXAM: CT HEAD WITHOUT CONTRAST CT CERVICAL SPINE WITHOUT CONTRAST TECHNIQUE: Multidetector CT imaging of the head and cervical spine was performed following the standard protocol without intravenous contrast. Multiplanar CT image reconstructions of the cervical spine were also generated. COMPARISON:  None. FINDINGS: CT HEAD FINDINGS Brain: No evidence of acute infarction, hemorrhage, hydrocephalus, extra-axial collection or mass lesion/mass effect. There is symmetric volume loss which is greater than expected for the patient's age. There are chronic microvascular ischemic changes bilaterally. Vascular: No hyperdense vessel or unexpected calcification. Skull: Normal. Negative for fracture or focal lesion. Sinuses/Orbits: There is maxillary mucosal thickening bilaterally. There is ethmoid air cell mucosal thickening bilaterally. The remaining paranasal sinuses and mastoid air cells are essentially clear. The patient is status post bilateral cataract surgery. Other: None. CT CERVICAL SPINE FINDINGS Alignment: Normal. Skull base and vertebrae: No acute fracture. No primary bone lesion or focal pathologic process. Soft tissues and spinal canal: No prevertebral fluid or swelling. No visible canal hematoma. Disc levels: There is mild multilevel disc height loss noted throughout the cervical spine greatest at the C6-C7 and C7-T1 levels. Upper chest:  Negative. Other: None IMPRESSION: 1. No acute intracranial abnormality detected. 2. No cervical spine fracture. 3. Moderate volume loss, greater than expected for the patient's age. There are chronic microvascular ischemic changes. Electronically Signed   By: Constance Holster M.D.   On: 02/26/2019 01:33   Ct Cervical Spine Wo Contrast  Result Date:  02/26/2019 CLINICAL DATA:  Head trauma. EXAM: CT HEAD WITHOUT CONTRAST CT CERVICAL SPINE WITHOUT CONTRAST TECHNIQUE: Multidetector CT imaging of the head and cervical spine was performed following the standard protocol without intravenous contrast. Multiplanar CT image reconstructions of the cervical spine were also generated. COMPARISON:  None. FINDINGS: CT HEAD FINDINGS Brain: No evidence of acute infarction, hemorrhage, hydrocephalus, extra-axial collection or mass lesion/mass effect. There is symmetric volume loss which is greater than expected for the patient's age. There are chronic microvascular ischemic changes bilaterally. Vascular: No hyperdense vessel or unexpected calcification. Skull: Normal. Negative for fracture or focal lesion. Sinuses/Orbits: There is maxillary mucosal thickening bilaterally. There is ethmoid air cell mucosal thickening bilaterally. The remaining paranasal sinuses and mastoid air cells are essentially clear. The patient is status post bilateral cataract surgery. Other: None. CT CERVICAL SPINE FINDINGS Alignment: Normal. Skull base and vertebrae: No acute fracture. No primary bone lesion or focal pathologic process. Soft tissues and spinal canal: No prevertebral fluid or swelling. No visible canal hematoma. Disc levels: There is mild multilevel disc height loss noted throughout the cervical spine greatest at the C6-C7 and C7-T1 levels. Upper chest: Negative. Other: None IMPRESSION: 1. No acute intracranial abnormality detected. 2. No cervical spine fracture. 3. Moderate volume loss, greater than expected for the patient's age. There  are chronic microvascular ischemic changes. Electronically Signed   By: Constance Holster M.D.   On: 02/26/2019 01:33   Dg Knee Complete 4 Views Left  Result Date: 02/26/2019 CLINICAL DATA:  Pain status post fall EXAM: LEFT KNEE - COMPLETE 4+ VIEW COMPARISON:  None. FINDINGS: The patient is status post remote total knee arthroplasty on the left. The hardware appears intact. The alignment appears normal. There is no evidence of a periprosthetic fracture. There appears to be a moderate to large joint effusion. IMPRESSION: 1. No acute displaced fracture or dislocation. 2. Postsurgical changes related to recent left total knee arthroplasty without evidence of a periprosthetic fracture. A large joint effusion is noted which is likely related to the recent surgical intervention. Electronically Signed   By: Constance Holster M.D.   On: 02/26/2019 01:43   Dg Hip Unilat W Or Wo Pelvis 2-3 Views Right  Result Date: 02/26/2019 CLINICAL DATA:  Pain EXAM: DG HIP (WITH OR WITHOUT PELVIS) 2-3V RIGHT COMPARISON:  None. FINDINGS: There is an acute displaced intratrochanteric fracture of the proximal right fume ir. There is a large medially displaced fracture fragment. There is no dislocation. There is osteopenia. There are mild degenerative changes of both femoroacetabular joints. IMPRESSION: Acute comminuted displaced intratrochanteric fracture of the proximal right femur. Electronically Signed   By: Constance Holster M.D.   On: 02/26/2019 01:40   Dg Femur Min 2 Views Right  Result Date: 02/26/2019 CLINICAL DATA:  Fracture EXAM: RIGHT FEMUR 2 VIEWS COMPARISON:  None. FINDINGS: There is an acute comminuted displaced intratrochanteric fracture of the proximal right femur. There is no dislocation. There are degenerative changes of the right knee. There is no distal femur fracture. IMPRESSION: Acute displaced intratrochanteric fracture of the proximal right femur. Electronically Signed   By: Constance Holster M.D.   On:  02/26/2019 01:44    ROS Blood pressure (!) 158/122, pulse (!) 45, temperature 99 F (37.2 C), temperature source Oral, resp. rate 15, SpO2 93 %. Physical Exam Patient is awake and responds to commands and is a little confused as to location and recall of earlier events. Neck non tender Bilateral shoulders and arms with pain free AROM Abdomen  soft Right LE shortened and externally rotated Left LE with healing total knee incision and moderate swelling No cords NVI  Assessment/Plan: Displaced intertrochanteric hip fracture - will need ORIF Recommend hold Xarelto, skin traction to assist with pain control Would be careful with narcotics - confusion Start Robaxin  Augustin Schooling 02/26/2019, 3:08 AM

## 2019-02-26 NOTE — Progress Notes (Signed)
Report called and given to Blaine Hamper, RN at [facility]. All questions answered to satisfaction. Pt to be transferred to [facility] via [facility]. Awaiting transportation. Will continue to monitor.

## 2019-02-26 NOTE — Progress Notes (Addendum)
PROGRESS NOTE    Jared [name]  YQM:[id number] DOB: July 10, 1941 DOA: 02/26/2019 PCP: [name], MD   Brief Narrative:  Jared [name] is a 78 y.o. male with medical history significant of hypertension, stroke, GERD, depression, anxiety, benzo dependence, OSA not on CPAP, PAF, BPH, loop recorder placement, who presents with fall, right hip pain. Pt was recently hospitalized from 6/5-6/8 for left knee replacement on 6/5. He was just discharged to rehab facility yesterday. He states that he fell accidentally when he tried to turn the light on in facility, injured his right hip, causing severe pain.  The pain is constant, 10 out of 10 severity, sharp, nonradiating. He denies loss of consciousness.  No head or neck injury.  Patient denies chest pain, shortness breath, cough, fever or chills.  No nausea vomiting, diarrhea, abdominal pain, symptoms of UTI or unilateral weakness. He states that he started taking Xarelto yesterday, but does not remember at what time he took it. In ED pt was found to have WBC 10.6, negative COVID-19 test, electrolytes renal function okay, bradycardia with heart rate 41-45, no tachypnea, oxygen saturation 93% on room air, temperature 99.7.  Chest x-ray negative.  X-ray of left knee showed postsurgical change without acute issues.  CT head is negative for acute intracranial abnormalities.  CT of C-spine is negative for acute bony fracture. X-ray of her right femur showed: Acute displaced intratrochanteric fracture of the proximal right femur. Patient is admitted to telemetry bed as inpatient. Dr. Veverly Fells of ortho was consulted.   Assessment & Plan:   Principal Problem:   Closed displaced intertrochanteric fracture of right femur ([place]) Active Problems:   Essential hypertension   GERD   BPH (benign prostatic hyperplasia)   Atrial fibrillation (HCC)   MDD (major depressive disorder), recurrent, in partial remission (HCC)   Benzodiazepine dependence ([place])   Status post total  knee replacement, left   Fall   Stroke [facility])   Closed displaced intertrochanteric fracture of right femur ([place]), POA S/P mechanical fall (loss of balance without syncope) -As noted on admission imaging -Orthopedic surgeon, Dr. Veverly Fells was consulted -defer to their expertise. -Unfortunately patient had taken Xarelto day prior to admission  - Pain control: morphine prn and percocet - When necessary Zofran for nausea - Robaxin for muscle spasm - Type and cross - INR/PTT - follow along with Ortho - PT/OT when able to (not ordered now)  Status post total knee replacement, left:  -replaced 02/22/19 - PT ongoing as tolerated to improve ROM/strength as patient remains bed bound from hip fx -No acute issues -pain controled well currently  Essential hypertension, well controlled -IV hydralazine as needed -Continue home meds: Coreg, Amlodipine  GERD, well controlled: -Protonix  BPH (benign prostatic hyperplasia): -dutasteride  Atrial fibrillation (PAF), rate controlled - without RVR:  CHA2DS2-VASc Score is 5, needs oral anticoagulation.  Continue to hold Xarelto perioperatively - defer to Ortho for re-initiation postoperatively INR checked and 2.0 on admission - unclear if this should be followed - defer to Ortho Borderline bradycardia in the setting of narcotics and coreg - continue lower dose at 3.125 (6.25 at admission)  MDD (major depressive disorder), recurrent, in partial remission, anxiey and Benzodiazepine dependence (HCC) -continue Xanax -Continue BuSpar Remeron  Hx of CVA, without deficits: -hold Xarelto now; not candidate for DAPT given anticoagulation  DVT prophylaxis: Xarelto, currently being held peri-operatively Code Status: Full Disposition Plan: Inpatient, pending surgical repair of right hip fracture, PT OT recommendations likely discharge back to skilled  nursing facility where patient was admitted from.  Consultants:   Ortho  Procedures:   Right hip  repair pending  Subjective: No acute issues or events overnight, right hip pain ongoing, moderately well controlled on current regimen, minimal muscle spasms noted overnight but otherwise patient feels quite well, obviously frustrated about recent readmission but otherwise no acute complaints.  Declines chest pain, shortness breath, nausea, vomiting, diarrhea, constipation, headache, fevers, chills.  Objective: Vitals:   02/26/19 0130 02/26/19 0230 02/26/19 0341 02/26/19 0741  BP: (!) 158/122 (!) 162/68 (!) 153/82 (!) 155/84  Pulse:   82 80  Resp: 15  18 18   Temp:   97.6 F (36.4 C) 98 F (36.7 C)  TempSrc:   Oral Oral  SpO2:  92% 95% 98%    Intake/Output Summary (Last 24 hours) at 02/26/2019 1443 Last data filed at 02/26/2019 1335 Gross per 24 hour  Intake 0 ml  Output 225 ml  Net -225 ml   There were no vitals filed for this visit.  Examination:  General:  Pleasantly resting in bed, No acute distress. HEENT:  Normocephalic atraumatic.  Sclerae nonicteric, noninjected.  Extraocular movements intact bilaterally. Neck:  Without mass or deformity.  Trachea is midline. Lungs:  Clear to auscultate bilaterally without rhonchi, wheeze, or rales. Heart:  Regular rate and rhythm.  Without murmurs, rubs, or gallops. Abdomen:  Soft, nontender, nondistended.  Without guarding or rebound. Extremities: Left knee wrapped, in immobilizer; right lower extremity in traction. Vascular:  Dorsalis pedis and posterior tibial pulses palpable bilaterally. Skin:  Warm and dry, no erythema, no ulcerations.   Data Reviewed: I have personally reviewed following labs and imaging studies  CBC: Recent Labs  Lab 02/23/19 0355 02/24/19 0337 02/26/19 0040 02/26/19 0441  WBC 12.9* 13.0* 10.6* 11.2*  NEUTROABS  --   --  7.6  --   HGB 12.9* 13.3 12.3* 11.6*  HCT 37.8* 40.2 36.3* 33.4*  MCV 92.4 94.1 91.2 89.8  PLT 193 202 201 010   Basic Metabolic Panel: Recent Labs  Lab 02/23/19 0355  02/26/19 0040 02/26/19 0441  NA 138 137 139  K 4.1 3.8 4.2  CL 107 104 105  CO2 22 23 26   GLUCOSE 166* 149* 143*  BUN 19 25* 24*  CREATININE 1.02 1.02 1.06  CALCIUM 8.7* 8.8* 8.8*   GFR: Estimated Creatinine Clearance: 67.8 mL/min (by C-G formula based on SCr of 1.06 mg/dL). Liver Function Tests: No results for input(s): AST, ALT, ALKPHOS, BILITOT, PROT, ALBUMIN in the last 168 hours. No results for input(s): LIPASE, AMYLASE in the last 168 hours. No results for input(s): AMMONIA in the last 168 hours. Coagulation Profile: Recent Labs  Lab 02/26/19 0441  INR 2.0*   Cardiac Enzymes: No results for input(s): CKTOTAL, CKMB, CKMBINDEX, TROPONINI in the last 168 hours. BNP (last 3 results) No results for input(s): PROBNP in the last 8760 hours. HbA1C: No results for input(s): HGBA1C in the last 72 hours. CBG: No results for input(s): GLUCAP in the last 168 hours. Lipid Profile: No results for input(s): CHOL, HDL, LDLCALC, TRIG, CHOLHDL, LDLDIRECT in the last 72 hours. Thyroid Function Tests: No results for input(s): TSH, T4TOTAL, FREET4, T3FREE, THYROIDAB in the last 72 hours. Anemia Panel: No results for input(s): VITAMINB12, FOLATE, FERRITIN, TIBC, IRON, RETICCTPCT in the last 72 hours. Sepsis Labs: No results for input(s): PROCALCITON, LATICACIDVEN in the last 168 hours.  Recent Results (from the past 240 hour(s))  Novel Coronavirus, NAA (hospital order; send-out to ref lab)  Status: None   Collection Time: 02/19/19  9:32 AM  Result Value Ref Range Status   SARS-CoV-2, NAA NOT DETECTED NOT DETECTED Final    Comment: (NOTE) This test was developed and its performance characteristics determined by [employer]. This test has not been FDA cleared or approved. This test has been authorized by FDA under an Emergency Use Authorization (EUA). This test is only authorized for the duration of time the declaration that circumstances exist justifying the  authorization of the emergency use of in vitro diagnostic tests for detection of SARS-CoV-2 virus and/or diagnosis of COVID-19 infection under section 564(b)(1) of the Act, 21 U.S.C. 616WVP-7(T)(0), unless the authorization is terminated or revoked sooner. When diagnostic testing is negative, the possibility of a false negative result should be considered in the context of a patient's recent exposures and the presence of clinical signs and symptoms consistent with COVID-19. An individual without symptoms of COVID-19 and who is not shedding SARS-CoV-2 virus would expect to have a negative (not detected) result in this assay. Performed  At: [facility] [address] [place], [place] [id number] Rush Farmer MD EV:[id number]    [place]  Final    Comment: Performed at [facility], [place] [address] [place], [place] [address]  SARS Coronavirus 2 (CEPHEID - Performed in [place] hospital lab), Hosp Order     Status: None   Collection Time: 02/26/19  1:58 AM  Result Value Ref Range Status   SARS Coronavirus 2 NEGATIVE NEGATIVE Final    Comment: (NOTE) If result is NEGATIVE SARS-CoV-2 target nucleic acids are NOT DETECTED. The SARS-CoV-2 RNA is generally detectable in upper and lower  respiratory specimens during the acute phase of infection. The lowest  concentration of SARS-CoV-2 viral copies this assay can detect is 250  copies / mL. A negative result does not preclude SARS-CoV-2 infection  and should not be used as the sole basis for treatment or other  patient management decisions.  A negative result may occur with  improper specimen collection / handling, submission of specimen other  than nasopharyngeal swab, presence of viral mutation(s) within the  areas targeted by this assay, and inadequate number of viral copies  (<250 copies / mL). A negative result must be combined with clinical  observations, patient history, and epidemiological  information. If result is POSITIVE SARS-CoV-2 target nucleic acids are DETECTED. The SARS-CoV-2 RNA is generally detectable in upper and lower  respiratory specimens dur ing the acute phase of infection.  Positive  results are indicative of active infection with SARS-CoV-2.  Clinical  correlation with patient history and other diagnostic information is  necessary to determine patient infection status.  Positive results do  not rule out bacterial infection or co-infection with other viruses. If result is PRESUMPTIVE POSTIVE SARS-CoV-2 nucleic acids MAY BE PRESENT.   A presumptive positive result was obtained on the submitted specimen  and confirmed on repeat testing.  While 2019 novel coronavirus  (SARS-CoV-2) nucleic acids may be present in the submitted sample  additional confirmatory testing may be necessary for epidemiological  and / or clinical management purposes  to differentiate between  SARS-CoV-2 and other Sarbecovirus currently known to infect humans.  If clinically indicated additional testing with an alternate test  methodology [phone]) is advised. The SARS-CoV-2 RNA is generally  detectable in upper and lower respiratory sp ecimens during the acute  phase of infection. The expected result is Negative. Fact Sheet for Patients:  StrictlyIdeas.no Fact Sheet for  Healthcare Providers: BankingDealers.co.za This test is not yet approved or cleared by the [place] and has been authorized for detection and/or diagnosis of SARS-CoV-2 by FDA under an Emergency Use Authorization (EUA).  This EUA will remain in effect (meaning this test can be used) for the duration of the COVID-19 declaration under Section 564(b)(1) of the Act, 21 U.S.C. section 360bbb-3(b)(1), unless the authorization is terminated or revoked sooner. Performed at [facility], [place] [address], [place], [place] [address]          Radiology  Studies: Dg Chest 1 View  Result Date: 02/26/2019 CLINICAL DATA:  Pain status post fall. EXAM: CHEST  1 VIEW COMPARISON:  None. FINDINGS: The heart size is enlarged. The patient is status post placement of a leadless pacemaker. There is no pneumothorax. No large pleural effusion. The lungs are essentially clear. There is no evidence of an acute osseous abnormality. Aortic calcifications are noted. Pleuroparenchymal scarring is noted at the lung apices. IMPRESSION: No active disease. Electronically Signed   By: Constance Holster M.D.   On: 02/26/2019 01:39   Ct Head Wo Contrast  Result Date: 02/26/2019 CLINICAL DATA:  Head trauma. EXAM: CT HEAD WITHOUT CONTRAST CT CERVICAL SPINE WITHOUT CONTRAST TECHNIQUE: Multidetector CT imaging of the head and cervical spine was performed following the standard protocol without intravenous contrast. Multiplanar CT image reconstructions of the cervical spine were also generated. COMPARISON:  None. FINDINGS: CT HEAD FINDINGS Brain: No evidence of acute infarction, hemorrhage, hydrocephalus, extra-axial collection or mass lesion/mass effect. There is symmetric volume loss which is greater than expected for the patient's age. There are chronic microvascular ischemic changes bilaterally. Vascular: No hyperdense vessel or unexpected calcification. Skull: Normal. Negative for fracture or focal lesion. Sinuses/Orbits: There is maxillary mucosal thickening bilaterally. There is ethmoid air cell mucosal thickening bilaterally. The remaining paranasal sinuses and mastoid air cells are essentially clear. The patient is status post bilateral cataract surgery. Other: None. CT CERVICAL SPINE FINDINGS Alignment: Normal. Skull base and vertebrae: No acute fracture. No primary bone lesion or focal pathologic process. Soft tissues and spinal canal: No prevertebral fluid or swelling. No visible canal hematoma. Disc levels: There is mild multilevel disc height loss noted throughout the cervical  spine greatest at the C6-C7 and C7-T1 levels. Upper chest: Negative. Other: None IMPRESSION: 1. No acute intracranial abnormality detected. 2. No cervical spine fracture. 3. Moderate volume loss, greater than expected for the patient's age. There are chronic microvascular ischemic changes. Electronically Signed   By: Constance Holster M.D.   On: 02/26/2019 01:33   Ct Cervical Spine Wo Contrast  Result Date: 02/26/2019 CLINICAL DATA:  Head trauma. EXAM: CT HEAD WITHOUT CONTRAST CT CERVICAL SPINE WITHOUT CONTRAST TECHNIQUE: Multidetector CT imaging of the head and cervical spine was performed following the standard protocol without intravenous contrast. Multiplanar CT image reconstructions of the cervical spine were also generated. COMPARISON:  None. FINDINGS: CT HEAD FINDINGS Brain: No evidence of acute infarction, hemorrhage, hydrocephalus, extra-axial collection or mass lesion/mass effect. There is symmetric volume loss which is greater than expected for the patient's age. There are chronic microvascular ischemic changes bilaterally. Vascular: No hyperdense vessel or unexpected calcification. Skull: Normal. Negative for fracture or focal lesion. Sinuses/Orbits: There is maxillary mucosal thickening bilaterally. There is ethmoid air cell mucosal thickening bilaterally. The remaining paranasal sinuses and mastoid air cells are essentially clear. The patient is status post bilateral cataract surgery. Other: None. CT CERVICAL SPINE FINDINGS Alignment: Normal. Skull base and vertebrae:  No acute fracture. No primary bone lesion or focal pathologic process. Soft tissues and spinal canal: No prevertebral fluid or swelling. No visible canal hematoma. Disc levels: There is mild multilevel disc height loss noted throughout the cervical spine greatest at the C6-C7 and C7-T1 levels. Upper chest: Negative. Other: None IMPRESSION: 1. No acute intracranial abnormality detected. 2. No cervical spine fracture. 3. Moderate volume  loss, greater than expected for the patient's age. There are chronic microvascular ischemic changes. Electronically Signed   By: Constance Holster M.D.   On: 02/26/2019 01:33   Dg Knee Complete 4 Views Left  Result Date: 02/26/2019 CLINICAL DATA:  Pain status post fall EXAM: LEFT KNEE - COMPLETE 4+ VIEW COMPARISON:  None. FINDINGS: The patient is status post remote total knee arthroplasty on the left. The hardware appears intact. The alignment appears normal. There is no evidence of a periprosthetic fracture. There appears to be a moderate to large joint effusion. IMPRESSION: 1. No acute displaced fracture or dislocation. 2. Postsurgical changes related to recent left total knee arthroplasty without evidence of a periprosthetic fracture. A large joint effusion is noted which is likely related to the recent surgical intervention. Electronically Signed   By: Constance Holster M.D.   On: 02/26/2019 01:43   Dg Hip Unilat W Or Wo Pelvis 2-3 Views Right  Result Date: 02/26/2019 CLINICAL DATA:  Pain EXAM: DG HIP (WITH OR WITHOUT PELVIS) 2-3V RIGHT COMPARISON:  None. FINDINGS: There is an acute displaced intratrochanteric fracture of the proximal right fume ir. There is a large medially displaced fracture fragment. There is no dislocation. There is osteopenia. There are mild degenerative changes of both femoroacetabular joints. IMPRESSION: Acute comminuted displaced intratrochanteric fracture of the proximal right femur. Electronically Signed   By: Constance Holster M.D.   On: 02/26/2019 01:40   Dg Femur Min 2 Views Right  Result Date: 02/26/2019 CLINICAL DATA:  Fracture EXAM: RIGHT FEMUR 2 VIEWS COMPARISON:  None. FINDINGS: There is an acute comminuted displaced intratrochanteric fracture of the proximal right femur. There is no dislocation. There are degenerative changes of the right knee. There is no distal femur fracture. IMPRESSION: Acute displaced intratrochanteric fracture of the proximal right femur.  Electronically Signed   By: Constance Holster M.D.   On: 02/26/2019 01:44        Scheduled Meds:  amLODipine  10 mg Oral Daily   buPROPion  150 mg Oral BID   carvedilol  3.125 mg Oral BID WC   cycloSPORINE  1 drop Both Eyes BID   dutasteride  0.5 mg Oral Daily   mirtazapine  15 mg Oral QHS   multivitamin with minerals  1 tablet Oral Daily   pantoprazole  40 mg Oral Daily   timolol  1 drop Both Eyes BID   Continuous Infusions:   LOS: 0 days   Time spent: 17min  Contessa Preuss C Camera Krienke, DO [facility]  If 7PM-7AM, please contact night-coverage [email or web address] Password [email or web address] 02/26/2019, 2:43 PM

## 2019-02-26 NOTE — Consult Note (Signed)
Reason for Consult:Right hip fracture Referring Physician: EDP  BRENN DEZIEL is an 78 y.o. male.  HPI: 78 yo male who is s/p left total knee replacement 5 days ago who was transferred today to a SNF from [facility].  He reports trying to get up to turn off a light switch and losing his balance falling and breaking his right hip. He complained of severe right hip pain and was unable to stand.  He was transported to [facility] ED for further eval and treatment.   Past Medical History:  Diagnosis Date  . Allergic rhinitis   . Anticoagulant long-term use    xarelto  . Anxiety   . Benign localized prostatic hyperplasia with lower urinary tract symptoms (LUTS)   . Benzodiazepine dependence ([place])   . Edema of right lower extremity   . GERD (gastroesophageal reflux disease)   . Heart murmur   . Hematuria   . Hepatitis    History of  . History of colonic polyps   . History of CVA (cerebrovascular accident) without residual deficits 07/26/2017   crytogenic stroke -- acute left caudate body infarct   . History of kidney stones   . Hypertension   . Ischemic cardiomyopathy 07/26/2017   ef 35-40%;    TEE 07-31-2017 ef 60-65%  . MDD (major depressive disorder), recurrent, in partial remission ([place])   . OA (osteoarthritis)    knees,   . OSA (obstructive sleep apnea)    intolerant cpap  . PAF (paroxysmal atrial fibrillation) [facility])    cardiologist-  dr g. taylor  . Skin cancer    Childhood, right foot on toe  . Status post placement of implantable loop recorder 07/31/2017  . Stroke ([place])   . Swelling of right foot 12/2018  . Throat mass   . Wears glasses     Past Surgical History:  Procedure Laterality Date  . APPENDECTOMY  1970s  . CATARACT EXTRACTION W/ INTRAOCULAR LENS  IMPLANT, BILATERAL Bilateral 2014  . CHOLECYSTECTOMY  1970s  . COLONOSCOPY    . CYSTOSCOPY    . FOOT SURGERY Left yrs ago  . HERNIA REPAIR  2014  . LOOP RECORDER INSERTION N/A 07/31/2017   Procedure: LOOP  RECORDER INSERTION;  Surgeon: Evans Lance, MD;  Location: [facility];  Service: Cardiovascular;  Laterality: N/A;  . TEE WITHOUT CARDIOVERSION N/A 07/31/2017   Procedure: TRANSESOPHAGEAL ECHOCARDIOGRAM (TEE) WITH LOOP;  Surgeon: Dorothy Spark, MD;  Location: [facility];  Service: Cardiovascular;  Laterality: N/A;  . TOTAL KNEE ARTHROPLASTY Left 02/22/2019   Procedure: TOTAL KNEE ARTHROPLASTY;  Surgeon: [name], MD;  Location: [facility];  Service: Orthopedics;  Laterality: Left;  . WRIST FRACTURE SURGERY  1970s   right x2  unsure if any hardware remain   ;    left x1 with pinning then removed    Family History  Problem Relation Age of Onset  . Heart disease Mother   . Heart disease Father   . Colon cancer Maternal Aunt 70    Social History:  reports that he has never smoked. He has never used smokeless tobacco. He reports that he does not drink alcohol or use drugs.  Allergies:  Allergies  Allergen Reactions  . Antihistamines, Diphenhydramine-Type     Blood in urine   . Aspirin     Blood in urine   . Codeine Nausea And Vomiting    Must take with food  . Pollen Extract Cough    Medications:  I have reviewed the patient's current medications.  Results for orders placed or performed during the hospital encounter of 02/26/19 (from the past 48 hour(s))  CBC with Differential/Platelet     Status: Abnormal   Collection Time: 02/26/19 12:40 AM  Result Value Ref Range   WBC 10.6 (H) 4.0 - 10.5 K/uL   RBC 3.98 (L) 4.22 - 5.81 MIL/uL   Hemoglobin 12.3 (L) 13.0 - 17.0 g/dL   HCT 36.3 (L) 39.0 - 52.0 %   MCV 91.2 80.0 - 100.0 fL   MCH 30.9 26.0 - 34.0 pg   MCHC 33.9 30.0 - 36.0 g/dL   RDW 12.0 11.5 - 15.5 %   Platelets 201 150 - 400 K/uL   nRBC 0.0 0.0 - 0.2 %   Neutrophils Relative % 71 %   Neutro Abs 7.6 1.7 - 7.7 K/uL   Lymphocytes Relative 15 %   Lymphs Abs 1.5 0.7 - 4.0 K/uL   Monocytes Relative 12 %   Monocytes Absolute 1.3 (H) 0.1 - 1.0 K/uL    Eosinophils Relative 1 %   Eosinophils Absolute 0.1 0.0 - 0.5 K/uL   Basophils Relative 0 %   Basophils Absolute 0.0 0.0 - 0.1 K/uL   Immature Granulocytes 1 %   Abs Immature Granulocytes 0.09 (H) 0.00 - 0.07 K/uL    Comment: Performed at [facility], [address] [place], [place] [address]  Basic metabolic panel     Status: Abnormal   Collection Time: 02/26/19 12:40 AM  Result Value Ref Range   Sodium 137 135 - 145 mmol/L   Potassium 3.8 3.5 - 5.1 mmol/L   Chloride 104 98 - 111 mmol/L   CO2 23 22 - 32 mmol/L   Glucose, Bld 149 (H) 70 - 99 mg/dL   BUN 25 (H) 8 - 23 mg/dL   Creatinine, Ser 1.02 0.61 - 1.24 mg/dL   Calcium 8.8 (L) 8.9 - 10.3 mg/dL   GFR calc non Af Amer >60 >60 mL/min   GFR calc Af Amer >60 >60 mL/min   Anion gap 10 5 - 15    Comment: Performed at [facility], [place] [address], [place], [place] [address]  SARS Coronavirus 2 (CEPHEID - Performed in [place] hospital lab), Hosp Order     Status: None   Collection Time: 02/26/19  1:58 AM  Result Value Ref Range   SARS Coronavirus 2 NEGATIVE NEGATIVE    Comment: (NOTE) If result is NEGATIVE SARS-CoV-2 target nucleic acids are NOT DETECTED. The SARS-CoV-2 RNA is generally detectable in upper and lower  respiratory specimens during the acute phase of infection. The lowest  concentration of SARS-CoV-2 viral copies this assay can detect is 250  copies / mL. A negative result does not preclude SARS-CoV-2 infection  and should not be used as the sole basis for treatment or other  patient management decisions.  A negative result may occur with  improper specimen collection / handling, submission of specimen other  than nasopharyngeal swab, presence of viral mutation(s) within the  areas targeted by this assay, and inadequate number of viral copies  (<250 copies / mL). A negative result must be combined with clinical  observations, patient history, and epidemiological information. If result is  POSITIVE SARS-CoV-2 target nucleic acids are DETECTED. The SARS-CoV-2 RNA is generally detectable in upper and lower  respiratory specimens dur ing the acute phase of infection.  Positive  results are indicative of active infection with SARS-CoV-2.  Clinical  correlation with patient history and other diagnostic information is  necessary to determine patient infection status.  Positive results do  not rule out bacterial infection or co-infection with other viruses. If result is PRESUMPTIVE POSTIVE SARS-CoV-2 nucleic acids MAY BE PRESENT.   A presumptive positive result was obtained on the submitted specimen  and confirmed on repeat testing.  While 2019 novel coronavirus  (SARS-CoV-2) nucleic acids may be present in the submitted sample  additional confirmatory testing may be necessary for epidemiological  and / or clinical management purposes  to differentiate between  SARS-CoV-2 and other Sarbecovirus currently known to infect humans.  If clinically indicated additional testing with an alternate test  methodology [phone]) is advised. The SARS-CoV-2 RNA is generally  detectable in upper and lower respiratory sp ecimens during the acute  phase of infection. The expected result is Negative. Fact Sheet for Patients:  StrictlyIdeas.no Fact Sheet for Healthcare Providers: BankingDealers.co.za This test is not yet approved or cleared by the [place] FDA and has been authorized for detection and/or diagnosis of SARS-CoV-2 by FDA under an Emergency Use Authorization (EUA).  This EUA will remain in effect (meaning this test can be used) for the duration of the COVID-19 declaration under Section 564(b)(1) of the Act, 21 U.S.C. section 360bbb-3(b)(1), unless the authorization is terminated or revoked sooner. Performed at [facility], [place] [address], [place], [place] [address]     Dg Chest 1 View  Result Date: 02/26/2019 CLINICAL  DATA:  Pain status post fall. EXAM: CHEST  1 VIEW COMPARISON:  None. FINDINGS: The heart size is enlarged. The patient is status post placement of a leadless pacemaker. There is no pneumothorax. No large pleural effusion. The lungs are essentially clear. There is no evidence of an acute osseous abnormality. Aortic calcifications are noted. Pleuroparenchymal scarring is noted at the lung apices. IMPRESSION: No active disease. Electronically Signed   By: Constance Holster M.D.   On: 02/26/2019 01:39   Ct Head Wo Contrast  Result Date: 02/26/2019 CLINICAL DATA:  Head trauma. EXAM: CT HEAD WITHOUT CONTRAST CT CERVICAL SPINE WITHOUT CONTRAST TECHNIQUE: Multidetector CT imaging of the head and cervical spine was performed following the standard protocol without intravenous contrast. Multiplanar CT image reconstructions of the cervical spine were also generated. COMPARISON:  None. FINDINGS: CT HEAD FINDINGS Brain: No evidence of acute infarction, hemorrhage, hydrocephalus, extra-axial collection or mass lesion/mass effect. There is symmetric volume loss which is greater than expected for the patient's age. There are chronic microvascular ischemic changes bilaterally. Vascular: No hyperdense vessel or unexpected calcification. Skull: Normal. Negative for fracture or focal lesion. Sinuses/Orbits: There is maxillary mucosal thickening bilaterally. There is ethmoid air cell mucosal thickening bilaterally. The remaining paranasal sinuses and mastoid air cells are essentially clear. The patient is status post bilateral cataract surgery. Other: None. CT CERVICAL SPINE FINDINGS Alignment: Normal. Skull base and vertebrae: No acute fracture. No primary bone lesion or focal pathologic process. Soft tissues and spinal canal: No prevertebral fluid or swelling. No visible canal hematoma. Disc levels: There is mild multilevel disc height loss noted throughout the cervical spine greatest at the C6-C7 and C7-T1 levels. Upper chest:  Negative. Other: None IMPRESSION: 1. No acute intracranial abnormality detected. 2. No cervical spine fracture. 3. Moderate volume loss, greater than expected for the patient's age. There are chronic microvascular ischemic changes. Electronically Signed   By: Constance Holster M.D.   On: 02/26/2019 01:33   Ct Cervical Spine Wo Contrast  Result Date:  02/26/2019 CLINICAL DATA:  Head trauma. EXAM: CT HEAD WITHOUT CONTRAST CT CERVICAL SPINE WITHOUT CONTRAST TECHNIQUE: Multidetector CT imaging of the head and cervical spine was performed following the standard protocol without intravenous contrast. Multiplanar CT image reconstructions of the cervical spine were also generated. COMPARISON:  None. FINDINGS: CT HEAD FINDINGS Brain: No evidence of acute infarction, hemorrhage, hydrocephalus, extra-axial collection or mass lesion/mass effect. There is symmetric volume loss which is greater than expected for the patient's age. There are chronic microvascular ischemic changes bilaterally. Vascular: No hyperdense vessel or unexpected calcification. Skull: Normal. Negative for fracture or focal lesion. Sinuses/Orbits: There is maxillary mucosal thickening bilaterally. There is ethmoid air cell mucosal thickening bilaterally. The remaining paranasal sinuses and mastoid air cells are essentially clear. The patient is status post bilateral cataract surgery. Other: None. CT CERVICAL SPINE FINDINGS Alignment: Normal. Skull base and vertebrae: No acute fracture. No primary bone lesion or focal pathologic process. Soft tissues and spinal canal: No prevertebral fluid or swelling. No visible canal hematoma. Disc levels: There is mild multilevel disc height loss noted throughout the cervical spine greatest at the C6-C7 and C7-T1 levels. Upper chest: Negative. Other: None IMPRESSION: 1. No acute intracranial abnormality detected. 2. No cervical spine fracture. 3. Moderate volume loss, greater than expected for the patient's age. There  are chronic microvascular ischemic changes. Electronically Signed   By: Constance Holster M.D.   On: 02/26/2019 01:33   Dg Knee Complete 4 Views Left  Result Date: 02/26/2019 CLINICAL DATA:  Pain status post fall EXAM: LEFT KNEE - COMPLETE 4+ VIEW COMPARISON:  None. FINDINGS: The patient is status post remote total knee arthroplasty on the left. The hardware appears intact. The alignment appears normal. There is no evidence of a periprosthetic fracture. There appears to be a moderate to large joint effusion. IMPRESSION: 1. No acute displaced fracture or dislocation. 2. Postsurgical changes related to recent left total knee arthroplasty without evidence of a periprosthetic fracture. A large joint effusion is noted which is likely related to the recent surgical intervention. Electronically Signed   By: Constance Holster M.D.   On: 02/26/2019 01:43   Dg Hip Unilat W Or Wo Pelvis 2-3 Views Right  Result Date: 02/26/2019 CLINICAL DATA:  Pain EXAM: DG HIP (WITH OR WITHOUT PELVIS) 2-3V RIGHT COMPARISON:  None. FINDINGS: There is an acute displaced intratrochanteric fracture of the proximal right fume ir. There is a large medially displaced fracture fragment. There is no dislocation. There is osteopenia. There are mild degenerative changes of both femoroacetabular joints. IMPRESSION: Acute comminuted displaced intratrochanteric fracture of the proximal right femur. Electronically Signed   By: Constance Holster M.D.   On: 02/26/2019 01:40   Dg Femur Min 2 Views Right  Result Date: 02/26/2019 CLINICAL DATA:  Fracture EXAM: RIGHT FEMUR 2 VIEWS COMPARISON:  None. FINDINGS: There is an acute comminuted displaced intratrochanteric fracture of the proximal right femur. There is no dislocation. There are degenerative changes of the right knee. There is no distal femur fracture. IMPRESSION: Acute displaced intratrochanteric fracture of the proximal right femur. Electronically Signed   By: Constance Holster M.D.   On:  02/26/2019 01:44    ROS Blood pressure (!) 158/122, pulse (!) 45, temperature 99 F (37.2 C), temperature source Oral, resp. rate 15, SpO2 93 %. Physical Exam Patient is awake and responds to commands and is a little confused as to location and recall of earlier events. Neck non tender Bilateral shoulders and arms with pain free AROM Abdomen  soft Right LE shortened and externally rotated Left LE with healing total knee incision and moderate swelling No cords NVI  Assessment/Plan: Displaced intertrochanteric hip fracture - will need ORIF Recommend hold Xarelto, skin traction to assist with pain control Would be careful with narcotics - confusion Start Robaxin  Augustin Schooling 02/26/2019, 3:08 AM

## 2019-02-26 NOTE — H&P (Signed)
History and Physical    Jared [name] PPI:[id number] DOB: 08/23/1941 DOA: 02/26/2019  Referring MD/NP/PA:   PCP: [name], MD   Patient coming from:  The patient is coming from rehab facility.  At baseline, pt is dependent for most of ADL.        Chief Complaint: fall, right hip pain.  HPI: Jared [name] is a 78 y.o. male with medical history significant of hypertension, stroke, GERD, depression, anxiety, benzo dependence, OSA not on CPAP, PAF, BPH, loop recorder placement, who presents with fall, right hip pain.  Pt was recently hospitalized from 6/5-6/8 for left knee replacement on 6/5. He was just discharged to rehab facility yesterday. He states that he fell accidentally when he tried to turn the light on in facility, injured his right hip, causing severe pain.  The pain is constant, 10 out of 10 severity, sharp, nonradiating. He denies loss of consciousness.  No head or neck injury.  Patient denies chest pain, shortness breath, cough, fever or chills.  No nausea vomiting, diarrhea, abdominal pain, symptoms of UTI or unilateral weakness. He states that he started taking Xarelto yesterday, but does not remember at what time he took it.  ED Course: pt was found to have WBC 10.6, negative COVID-19 test, electrolytes renal function okay, bradycardia with heart rate 41-45, no tachypnea, oxygen saturation 93% on room air, temperature 99.7.  Chest x-ray negative.  X-ray of left knee showed postsurgical change without acute issues.  CT head is negative for acute intracranial abnormalities.  CT of C-spine is negative for acute bony fracture. Patient is admitted to telemetry bed as inpatient. Dr. Veverly Fells of ortho was consulted.   # X-ray of her right femur showed: Acute displaced intratrochanteric fracture of the proximal right femur.   Review of Systems:   General: no fevers, chills, no body weight gain, has fatigue HEENT: no blurry vision, hearing changes or sore throat Respiratory: no  dyspnea, coughing, wheezing CV: no chest pain, no palpitations GI: no nausea, vomiting, abdominal pain, diarrhea, constipation GU: no dysuria, burning on urination, increased urinary frequency, hematuria  Ext: no leg edema Neuro: no unilateral weakness, numbness, or tingling, no vision change or hearing loss. Had fall. Skin: no rash, no skin tear. MSK: has pain in right hip and left knee Heme: No easy bruising.  Travel history: No recent long distant travel.  Allergy:  Allergies  Allergen Reactions  . Antihistamines, Diphenhydramine-Type     Blood in urine   . Aspirin     Blood in urine   . Codeine Nausea And Vomiting    Must take with food  . Pollen Extract Cough    Past Medical History:  Diagnosis Date  . Allergic rhinitis   . Anticoagulant long-term use    xarelto  . Anxiety   . Benign localized prostatic hyperplasia with lower urinary tract symptoms (LUTS)   . Benzodiazepine dependence ([place])   . Edema of right lower extremity   . GERD (gastroesophageal reflux disease)   . Heart murmur   . Hematuria   . Hepatitis    History of  . History of colonic polyps   . History of CVA (cerebrovascular accident) without residual deficits 07/26/2017   crytogenic stroke -- acute left caudate body infarct   . History of kidney stones   . Hypertension   . Ischemic cardiomyopathy 07/26/2017   ef 35-40%;    TEE 07-31-2017 ef 60-65%  . MDD (major depressive disorder), recurrent, in partial remission (  HCC)   . OA (osteoarthritis)    knees,   . OSA (obstructive sleep apnea)    intolerant cpap  . PAF (paroxysmal atrial fibrillation) [facility])    cardiologist-  dr g. taylor  . Skin cancer    Childhood, right foot on toe  . Status post placement of implantable loop recorder 07/31/2017  . Stroke ([place])   . Swelling of right foot 12/2018  . Throat mass   . Wears glasses     Past Surgical History:  Procedure Laterality Date  . APPENDECTOMY  1970s  . CATARACT EXTRACTION W/  INTRAOCULAR LENS  IMPLANT, BILATERAL Bilateral 2014  . CHOLECYSTECTOMY  1970s  . COLONOSCOPY    . CYSTOSCOPY    . FOOT SURGERY Left yrs ago  . HERNIA REPAIR  2014  . LOOP RECORDER INSERTION N/A 07/31/2017   Procedure: LOOP RECORDER INSERTION;  Surgeon: Evans Lance, MD;  Location: [facility];  Service: Cardiovascular;  Laterality: N/A;  . TEE WITHOUT CARDIOVERSION N/A 07/31/2017   Procedure: TRANSESOPHAGEAL ECHOCARDIOGRAM (TEE) WITH LOOP;  Surgeon: Dorothy Spark, MD;  Location: [facility];  Service: Cardiovascular;  Laterality: N/A;  . TOTAL KNEE ARTHROPLASTY Left 02/22/2019   Procedure: TOTAL KNEE ARTHROPLASTY;  Surgeon: [name], MD;  Location: [facility];  Service: Orthopedics;  Laterality: Left;  . WRIST FRACTURE SURGERY  1970s   right x2  unsure if any hardware remain   ;    left x1 with pinning then removed    Social History:  reports that he has never smoked. He has never used smokeless tobacco. He reports that he does not drink alcohol or use drugs.  Family History:  Family History  Problem Relation Age of Onset  . Heart disease Mother   . Heart disease Father   . Colon cancer Maternal Aunt 70     Prior to Admission medications   Medication Sig Start Date End Date Taking? Authorizing Provider  acetaminophen (TYLENOL) 325 MG tablet Take 325 mg by mouth every 6 (six) hours as needed for moderate pain or headache.    [provider]  ALPRAZolam (XANAX) 1 MG tablet TAKE 0.5 TABLETS (0.5 MG TOTAL) BY MOUTH 3 (THREE) TIMES DAILY AS NEEDED FOR ANXIETY. 02/15/19   Viviana Simpler I, MD  amLODipine (NORVASC) 10 MG tablet Take 1 tablet (10 mg total) by mouth daily. 08/23/18   [name], MD  buPROPion (WELLBUTRIN SR) 150 MG 12 hr tablet TAKE 1 TABLET BY MOUTH TWICE A DAY Patient taking differently: Take 150 mg by mouth 2 (two) times daily.  11/16/18   [name], MD  carvedilol (COREG) 6.25 MG tablet Take 1 tablet (6.25 mg total) by mouth 2 (two)  times daily with a meal. 05/07/18   Marletta Lor, MD  dutasteride (AVODART) 0.5 MG capsule Take 1 capsule (0.5 mg total) by mouth daily. 09/17/18   [name], MD  Menthol, Topical Analgesic, (ICY HOT EX) Apply 1 application topically daily as needed (pain).    [provider]  methocarbamol (ROBAXIN) 500 MG tablet Take 1 tablet (500 mg total) by mouth 3 (three) times daily as needed. 02/25/19   [name], MD  mirtazapine (REMERON) 15 MG tablet TAKE 1 TABLET BY MOUTH EVERYDAY AT BEDTIME Patient taking differently: Take 15 mg by mouth at bedtime.  01/14/19   [name], MD  Multiple Vitamin (MULTIVITAMIN WITH MINERALS) TABS tablet Take 1 tablet by mouth daily.    [provider]  omeprazole (PRILOSEC) 10 MG capsule Take 1 capsule (10 mg total) by mouth daily. 02/21/19   [name], MD  oxyCODONE-acetaminophen (PERCOCET) 5-325 MG tablet Take 1 tablet by mouth every 6 (six) hours as needed for moderate pain or severe pain. 02/25/19 02/25/20  [name], MD  Propylene Glycol (SYSTANE BALANCE) 0.6 % SOLN Place 1 drop into both eyes daily as needed (dry eyes).    [provider]  RESTASIS 0.05 % ophthalmic emulsion Place 1 drop 2 (two) times daily into both eyes. 08/03/17   [provider]  rivaroxaban (XARELTO) 20 MG TABS tablet Take 1 tablet (20 mg total) by mouth daily with supper. 02/25/19   [name], MD  timolol (TIMOPTIC) 0.5 % ophthalmic solution Place 1 drop into both eyes 2 (two) times daily.  06/29/17   [provider]  valsartan (DIOVAN) 80 MG tablet Take 1 tablet (80 mg total) by mouth daily. 09/17/18   [name], MD    Physical Exam: Vitals:   02/26/19 0115 02/26/19 0130 02/26/19 0230 02/26/19 0341  BP: (!) 158/79 (!) 158/122 (!) 162/68 (!) 153/82  Pulse:    82  Resp: 14 15  18   Temp:    97.6 F (36.4 C)  TempSrc:    Oral  SpO2:   92% 95%   General: Not in acute distress HEENT:       Eyes: PERRL,  EOMI, no scleral icterus.       ENT: No discharge from the ears and nose, no pharynx injection, no tonsillar enlargement.        Neck: No JVD, no bruit, no mass felt. Heme: No neck lymph node enlargement. Cardiac: S1/S2, RRR, No murmurs, No gallops or rubs. Respiratory: No rales, wheezing, rhonchi or rubs. GI: Soft, nondistended, nontender, no rebound pain, no organomegaly, BS present. GU: No hematuria Ext: No pitting leg edema bilaterally. 1+DP/PT pulse bilaterally. Musculoskeletal: s/p of left knee replacement. Has tenderness in right hip. Right leg is shortened and externally rotated.  Unable to move the legs.  Skin: No rashes.  Neuro: Alert, oriented X3, cranial nerves II-XII grossly intact. Psych: Patient is not psychotic, no suicidal or hemocidal ideation.  Labs on Admission: I have personally reviewed following labs and imaging studies  CBC: Recent Labs  Lab 02/23/19 0355 02/24/19 0337 02/26/19 0040 02/26/19 0441  WBC 12.9* 13.0* 10.6* 11.2*  NEUTROABS  --   --  7.6  --   HGB 12.9* 13.3 12.3* 11.6*  HCT 37.8* 40.2 36.3* 33.4*  MCV 92.4 94.1 91.2 89.8  PLT 193 202 201 818   Basic Metabolic Panel: Recent Labs  Lab 02/23/19 0355 02/26/19 0040 02/26/19 0441  NA 138 137 139  K 4.1 3.8 4.2  CL 107 104 105  CO2 22 23 26   GLUCOSE 166* 149* 143*  BUN 19 25* 24*  CREATININE 1.02 1.02 1.06  CALCIUM 8.7* 8.8* 8.8*   GFR: Estimated Creatinine Clearance: 67.8 mL/min (by C-G formula based on SCr of 1.06 mg/dL). Liver Function Tests: No results for input(s): AST, ALT, ALKPHOS, BILITOT, PROT, ALBUMIN in the last 168 hours. No results for input(s): LIPASE, AMYLASE in the last 168 hours. No results for input(s): AMMONIA in the last 168 hours. Coagulation Profile: Recent Labs  Lab 02/26/19 0441  INR 2.0*   Cardiac Enzymes: No results for input(s): CKTOTAL, CKMB, CKMBINDEX, TROPONINI in the last 168 hours. BNP (last 3 results) No results for input(s): PROBNP in the last  8760 hours.  HbA1C: No results for input(s): HGBA1C in the last 72 hours. CBG: No results for input(s): GLUCAP in the last 168 hours. Lipid Profile: No results for input(s): CHOL, HDL, LDLCALC, TRIG, CHOLHDL, LDLDIRECT in the last 72 hours. Thyroid Function Tests: No results for input(s): TSH, T4TOTAL, FREET4, T3FREE, THYROIDAB in the last 72 hours. Anemia Panel: No results for input(s): VITAMINB12, FOLATE, FERRITIN, TIBC, IRON, RETICCTPCT in the last 72 hours. Urine analysis:    Component Value Date/Time   COLORURINE YELLOW 10/29/2018 0932   APPEARANCEUR CLEAR 10/29/2018 0932   LABSPEC 1.015 10/29/2018 0932   PHURINE 7.0 10/29/2018 0932   GLUCOSEU NEGATIVE 10/29/2018 0932   HGBUR SMALL (A) 10/29/2018 0932   BILIRUBINUR NEGATIVE 10/29/2018 0932   BILIRUBINUR 1+ 09/01/2018 0902   KETONESUR NEGATIVE 10/29/2018 0932   PROTEINUR Positive (A) 09/01/2018 0902   PROTEINUR NEGATIVE 02/28/2018 1103   UROBILINOGEN 0.2 10/29/2018 0932   NITRITE NEGATIVE 10/29/2018 0932   LEUKOCYTESUR NEGATIVE 10/29/2018 0932   Sepsis Labs: @LABRCNTIP (procalcitonin:4,lacticidven:4) ) Recent Results (from the past 240 hour(s))  Novel Coronavirus, NAA (hospital order; send-out to ref lab)     Status: None   Collection Time: 02/19/19  9:32 AM  Result Value Ref Range Status   SARS-CoV-2, NAA NOT DETECTED NOT DETECTED Final    Comment: (NOTE) This test was developed and its performance characteristics determined by [employer]. This test has not been FDA cleared or approved. This test has been authorized by FDA under an Emergency Use Authorization (EUA). This test is only authorized for the duration of time the declaration that circumstances exist justifying the authorization of the emergency use of in vitro diagnostic tests for detection of SARS-CoV-2 virus and/or diagnosis of COVID-19 infection under section 564(b)(1) of the Act, 21 U.S.C. 810FBP-1(W)(2), unless the authorization is terminated  or revoked sooner. When diagnostic testing is negative, the possibility of a false negative result should be considered in the context of a patient's recent exposures and the presence of clinical signs and symptoms consistent with COVID-19. An individual without symptoms of COVID-19 and who is not shedding SARS-CoV-2 virus would expect to have a negative (not detected) result in this assay. Performed  At: [facility] [address] [place], [place] [id number] Rush Farmer MD MP:[id number]    [place]  Final    Comment: Performed at [facility], [place] [address] [place], [place] [address]  SARS Coronavirus 2 (CEPHEID - Performed in [place] hospital lab), Hosp Order     Status: None   Collection Time: 02/26/19  1:58 AM  Result Value Ref Range Status   SARS Coronavirus 2 NEGATIVE NEGATIVE Final    Comment: (NOTE) If result is NEGATIVE SARS-CoV-2 target nucleic acids are NOT DETECTED. The SARS-CoV-2 RNA is generally detectable in upper and lower  respiratory specimens during the acute phase of infection. The lowest  concentration of SARS-CoV-2 viral copies this assay can detect is 250  copies / mL. A negative result does not preclude SARS-CoV-2 infection  and should not be used as the sole basis for treatment or other  patient management decisions.  A negative result may occur with  improper specimen collection / handling, submission of specimen other  than nasopharyngeal swab, presence of viral mutation(s) within the  areas targeted by this assay, and inadequate number of viral copies  (<250 copies / mL). A negative result must be combined with clinical  observations, patient history, and epidemiological information. If result is POSITIVE SARS-CoV-2 target nucleic  acids are DETECTED. The SARS-CoV-2 RNA is generally detectable in upper and lower  respiratory specimens dur ing the acute phase of infection.  Positive  results are indicative  of active infection with SARS-CoV-2.  Clinical  correlation with patient history and other diagnostic information is  necessary to determine patient infection status.  Positive results do  not rule out bacterial infection or co-infection with other viruses. If result is PRESUMPTIVE POSTIVE SARS-CoV-2 nucleic acids MAY BE PRESENT.   A presumptive positive result was obtained on the submitted specimen  and confirmed on repeat testing.  While 2019 novel coronavirus  (SARS-CoV-2) nucleic acids may be present in the submitted sample  additional confirmatory testing may be necessary for epidemiological  and / or clinical management purposes  to differentiate between  SARS-CoV-2 and other Sarbecovirus currently known to infect humans.  If clinically indicated additional testing with an alternate test  methodology [phone]) is advised. The SARS-CoV-2 RNA is generally  detectable in upper and lower respiratory sp ecimens during the acute  phase of infection. The expected result is Negative. Fact Sheet for Patients:  StrictlyIdeas.no Fact Sheet for Healthcare Providers: BankingDealers.co.za This test is not yet approved or cleared by the [place] FDA and has been authorized for detection and/or diagnosis of SARS-CoV-2 by FDA under an Emergency Use Authorization (EUA).  This EUA will remain in effect (meaning this test can be used) for the duration of the COVID-19 declaration under Section 564(b)(1) of the Act, 21 U.S.C. section 360bbb-3(b)(1), unless the authorization is terminated or revoked sooner. Performed at [facility], [place] [address] [place], [place] [address]      Radiological Exams on Admission: Dg Chest 1 View  Result Date: 02/26/2019 CLINICAL DATA:  Pain status post fall. EXAM: CHEST  1 VIEW COMPARISON:  None. FINDINGS: The heart size is enlarged. The patient is status post placement of a leadless pacemaker. There is no  pneumothorax. No large pleural effusion. The lungs are essentially clear. There is no evidence of an acute osseous abnormality. Aortic calcifications are noted. Pleuroparenchymal scarring is noted at the lung apices. IMPRESSION: No active disease. Electronically Signed   By: Constance Holster M.D.   On: 02/26/2019 01:39   Ct Head Wo Contrast  Result Date: 02/26/2019 CLINICAL DATA:  Head trauma. EXAM: CT HEAD WITHOUT CONTRAST CT CERVICAL SPINE WITHOUT CONTRAST TECHNIQUE: Multidetector CT imaging of the head and cervical spine was performed following the standard protocol without intravenous contrast. Multiplanar CT image reconstructions of the cervical spine were also generated. COMPARISON:  None. FINDINGS: CT HEAD FINDINGS Brain: No evidence of acute infarction, hemorrhage, hydrocephalus, extra-axial collection or mass lesion/mass effect. There is symmetric volume loss which is greater than expected for the patient's age. There are chronic microvascular ischemic changes bilaterally. Vascular: No hyperdense vessel or unexpected calcification. Skull: Normal. Negative for fracture or focal lesion. Sinuses/Orbits: There is maxillary mucosal thickening bilaterally. There is ethmoid air cell mucosal thickening bilaterally. The remaining paranasal sinuses and mastoid air cells are essentially clear. The patient is status post bilateral cataract surgery. Other: None. CT CERVICAL SPINE FINDINGS Alignment: Normal. Skull base and vertebrae: No acute fracture. No primary bone lesion or focal pathologic process. Soft tissues and spinal canal: No prevertebral fluid or swelling. No visible canal hematoma. Disc levels: There is mild multilevel disc height loss noted throughout the cervical spine greatest at the C6-C7 and C7-T1 levels. Upper chest: Negative. Other: None IMPRESSION: 1. No acute intracranial abnormality detected. 2. No cervical spine  fracture. 3. Moderate volume loss, greater than expected for the patient's age.  There are chronic microvascular ischemic changes. Electronically Signed   By: Constance Holster M.D.   On: 02/26/2019 01:33   Ct Cervical Spine Wo Contrast  Result Date: 02/26/2019 CLINICAL DATA:  Head trauma. EXAM: CT HEAD WITHOUT CONTRAST CT CERVICAL SPINE WITHOUT CONTRAST TECHNIQUE: Multidetector CT imaging of the head and cervical spine was performed following the standard protocol without intravenous contrast. Multiplanar CT image reconstructions of the cervical spine were also generated. COMPARISON:  None. FINDINGS: CT HEAD FINDINGS Brain: No evidence of acute infarction, hemorrhage, hydrocephalus, extra-axial collection or mass lesion/mass effect. There is symmetric volume loss which is greater than expected for the patient's age. There are chronic microvascular ischemic changes bilaterally. Vascular: No hyperdense vessel or unexpected calcification. Skull: Normal. Negative for fracture or focal lesion. Sinuses/Orbits: There is maxillary mucosal thickening bilaterally. There is ethmoid air cell mucosal thickening bilaterally. The remaining paranasal sinuses and mastoid air cells are essentially clear. The patient is status post bilateral cataract surgery. Other: None. CT CERVICAL SPINE FINDINGS Alignment: Normal. Skull base and vertebrae: No acute fracture. No primary bone lesion or focal pathologic process. Soft tissues and spinal canal: No prevertebral fluid or swelling. No visible canal hematoma. Disc levels: There is mild multilevel disc height loss noted throughout the cervical spine greatest at the C6-C7 and C7-T1 levels. Upper chest: Negative. Other: None IMPRESSION: 1. No acute intracranial abnormality detected. 2. No cervical spine fracture. 3. Moderate volume loss, greater than expected for the patient's age. There are chronic microvascular ischemic changes. Electronically Signed   By: Constance Holster M.D.   On: 02/26/2019 01:33   Dg Knee Complete 4 Views Left  Result Date:  02/26/2019 CLINICAL DATA:  Pain status post fall EXAM: LEFT KNEE - COMPLETE 4+ VIEW COMPARISON:  None. FINDINGS: The patient is status post remote total knee arthroplasty on the left. The hardware appears intact. The alignment appears normal. There is no evidence of a periprosthetic fracture. There appears to be a moderate to large joint effusion. IMPRESSION: 1. No acute displaced fracture or dislocation. 2. Postsurgical changes related to recent left total knee arthroplasty without evidence of a periprosthetic fracture. A large joint effusion is noted which is likely related to the recent surgical intervention. Electronically Signed   By: Constance Holster M.D.   On: 02/26/2019 01:43   Dg Hip Unilat W Or Wo Pelvis 2-3 Views Right  Result Date: 02/26/2019 CLINICAL DATA:  Pain EXAM: DG HIP (WITH OR WITHOUT PELVIS) 2-3V RIGHT COMPARISON:  None. FINDINGS: There is an acute displaced intratrochanteric fracture of the proximal right fume ir. There is a large medially displaced fracture fragment. There is no dislocation. There is osteopenia. There are mild degenerative changes of both femoroacetabular joints. IMPRESSION: Acute comminuted displaced intratrochanteric fracture of the proximal right femur. Electronically Signed   By: Constance Holster M.D.   On: 02/26/2019 01:40   Dg Femur Min 2 Views Right  Result Date: 02/26/2019 CLINICAL DATA:  Fracture EXAM: RIGHT FEMUR 2 VIEWS COMPARISON:  None. FINDINGS: There is an acute comminuted displaced intratrochanteric fracture of the proximal right femur. There is no dislocation. There are degenerative changes of the right knee. There is no distal femur fracture. IMPRESSION: Acute displaced intratrochanteric fracture of the proximal right femur. Electronically Signed   By: Constance Holster M.D.   On: 02/26/2019 01:44     EKG: Independently reviewed.  Sinus rhythm, QTC 470, poor R wave progression, PAC,  PVC, LAD.   Assessment/Plan Principal Problem:   Closed  displaced intertrochanteric fracture of right femur (HCC) Active Problems:   Essential hypertension   GERD   BPH (benign prostatic hyperplasia)   Atrial fibrillation (HCC)   MDD (major depressive disorder), recurrent, in partial remission ([place])   Benzodiazepine dependence ([place])   Status post total knee replacement, left   Fall   Stroke [facility])   Closed displaced intertrochanteric fracture of right femur ([place]):  As evidenced by x-ray. Patient has moderate pain now. No neurovascular compromise. Orthopedic surgeon, Dr. Veverly Fells was consulted.  He states that he started taking Xarelto yesterday, but does not remember at what time he took it.  - will admit to tele bed - Pain control: morphine prn and percocet - When necessary Zofran for nausea - Robaxin for muscle spasm - type and cross - INR/PTT - PT/OT when able to (not ordered now)  Status post total knee replacement, left: No acute issues -pain controle  Essential hypertension: -IV hydralazine as needed -Coreg -Amlodipine  GERD: -Protonix  BPH (benign prostatic hyperplasia): -dutasteride  Atrial fibrillation (PAF): CHA2DS2-VASc Score is 5, needs oral anticoagulation. Patient is on Xarelto at home. INR is  on admission. Heart rate is well controlled, actually patient has a bradycardia with heart rate 41-45 in ED. -hold xarelto for possible surgery -Continue Coreg, but decrease dose from 6.25 to 3.125 mg twice daily  MDD (major depressive disorder), recurrent, in partial remission, anxieyt and Benzodiazepine dependence ([place]) -continue Xanax -Continue BuSpar Remeron  Hx of stroke: -hold Xarelto now  Fall: seems have had mechanical fall.  CT head negative.  CT of C-spine negative for acute issues. - PT/OT when able to.    Perioperative Cardiac Risk: He has multiple comorbidities, including PAF, PVC, stroke, HTN, BPH, patient, anxiety. Pt just had left knee replacement, is currently doing rehab. He is on Coreg and xarelto  at home. His Xarelto is on hold now. No recent acute cardiac issues.  Patient does not have chest pain, shortness of breath, palpitation.  No signs of acute CHF currently.  EKG has no acute change. He had 2D echo 07/31/2017 showed EF of 60-65%. At this time point, no further work up is needed. His GUPTA score perioperative myocardial infarction or cardaic arrest is 2.09 %, which is moderated of risk.  Inpatient status:  # Patient requires inpatient status due to high intensity of service, high risk for further deterioration and high frequency of surveillance required.  I certify that at the point of admission it is my clinical judgment that the patient will require inpatient hospital care spanning beyond 2 midnights from the point of admission.  . This patient has multiple chronic comorbidities including hypertension, stroke, GERD, depression, anxiety, benzo dependence, OSA not on CPAP, PAF, BPH, loop recorder placement, recently s/p of left knee replacement on 6/5. Marland Kitchen Now patient has presenting with fall and closed displaced intertrochanteric fracture of right femur  . The worrisome physical exam findings include tendeness in right hip and left knee . The initial radiographic and laboratory data are worrisome because X-ray showed acute displaced intratrochanteric fracture of the proximal right femur. . Current medical needs: please see my assessment and plan . Predictability of an adverse outcome (risk): Patient has multiple comorbidities as listed above, just had left knee replacement on 6/5, now presents with fall and right hip fracture.  Patient will need surgery.  Patient is at high risk for developing complications given his multiple comorbidities.  Patient will  need to be treated in hospital for at least 2 days.        DVT ppx: SCD Code Status: Full code (I discussed with patient about code status, he is not ready to make decision now, saying "I don't know". Will order full code now.  This  needs to be discussed with power of attorney in the morning). Family Communication: None at bed side.      Disposition Plan:  Anticipate discharge back to previous rehab Consults called:  Dr. Veverly Fells Admission status:   Inpatient/tele      Date of Service 02/26/2019    [facility]   If 7PM-7AM, please contact night-coverage [email or web address] Password [facility] 02/26/2019, 5:47 AM

## 2019-02-26 NOTE — Progress Notes (Signed)
Per tele, pt had 12 beat run SVT with HR reaching 170's. Avon Gully, MD paged. Will continue to monitor.

## 2019-02-26 NOTE — Evaluation (Addendum)
Physical Therapy Evaluation Patient Details Name: Jared [name] MRN: [id number] DOB: October 02, 1940 Today's Date: 02/26/2019   History of Present Illness  Pt is a 9 u.o. male with recent L TKA (02/22/19) with d/c to SNF, now admitted 02/26/19 after fall at facility sustaining R intertrochanteric femur fx. Per ortho, plan for R femur ORIF. PMH includes HTN, PAF, stroke, OA, anxiety.    Clinical Impression  Pt presents with an overall decrease in functional mobility secondary to above. Prior to initial L TKA, pt indep and lives alone; was d/c to SNF post-op and fell trying to ambulate without RW leading to readmission with hip fx. Today's session focused on LLE ROM/therex as pt on bedrest awaiting transfer to [facility] for RLE sx; L knee ROM very limited by pain and stiffness, unable to recall any therex from recent admission. Pt would benefit from continued acute PT services to maximize functional mobility and independence prior to d/c with SNF-level therapies.     Follow Up Recommendations SNF;Supervision for mobility/OOB    Equipment Recommendations  (TBD)    Recommendations for Other Services       Precautions / Restrictions Precautions Precautions: Fall Precaution Comments: Bedrest with RLE in traction Required Braces or Orthoses: Knee Immobilizer - Left      Mobility  Bed Mobility               General bed mobility comments: Unable to perform due to RLE immobilization in traction  Transfers                    Ambulation/Gait                Stairs            Wheelchair Mobility    Modified Rankin (Stroke Patients Only)       Balance                                             Pertinent Vitals/Pain Pain Assessment: Faces Faces Pain Scale: Hurts whole lot Pain Location: L knee with PROM Pain Descriptors / Indicators: Sore;Aching;Grimacing Pain Intervention(s): Premedicated before session;Repositioned    Home Living  Family/patient expects to be discharged to:: Skilled nursing facility Living Arrangements: Alone   Type of Home: House Home Access: Stairs to enter Entrance Stairs-Rails: None Entrance Stairs-Number of Steps: 3 Home Layout: One level Home Equipment: Cane - single point;Cane - quad      Prior Function Level of Independence: Independent         Comments: Prior to L TKA, pt indep with mobility. Was d/c to [facility] and fell before starting with PT/OT     Hand Dominance   Dominant Hand: Right    Extremity/Trunk Assessment   Upper Extremity Assessment Upper Extremity Assessment: Overall WFL for tasks assessed    Lower Extremity Assessment Lower Extremity Assessment: RLE deficits/detail;LLE deficits/detail RLE Deficits / Details: Immobilized due to pre-op hip fx LLE Deficits / Details: Significant LLE pain and stiffness; passive knee flexion to ~20', able to abd/add and perform quad sets, unable to perform active knee flexion or SLR LLE Coordination: decreased fine motor;decreased gross motor       Communication   Communication: No difficulties  Cognition Arousal/Alertness: Awake/alert Behavior During Therapy: WFL for tasks assessed/performed Overall Cognitive Status: No family/caregiver present to determine baseline cognitive functioning Area of  Impairment: Attention;Awareness;Problem solving                   Current Attention Level: Selective       Awareness: Emergent Problem Solving: Requires verbal cues        General Comments      Exercises Total Joint Exercises Quad Sets: AROM;Left;Supine;10 reps Heel Slides: AAROM;Left;10 reps;Supine Hip ABduction/ADduction: AROM;Left;Supine;10 reps Straight Leg Raises: AAROM;Left;5 reps;Supine   Assessment/Plan    PT Assessment Patient needs continued PT services  PT Problem List Decreased mobility;Decreased safety awareness;Decreased strength;Decreased range of motion;Decreased activity  tolerance;Decreased balance;Decreased knowledge of use of DME;Pain;Decreased knowledge of precautions       PT Treatment Interventions DME instruction;Functional mobility training;Balance training;Patient/family education;Therapeutic activities;Stair training;Therapeutic exercise;Gait training    PT Goals (Current goals can be found in the Care Plan section)  Acute Rehab PT Goals Patient Stated Goal: Understands he will need more rehab before home PT Goal Formulation: With patient Time For Goal Achievement: 03/12/19 Potential to Achieve Goals: Good    Frequency Min 5X/week   Barriers to discharge Decreased caregiver support      Co-evaluation               AM-PAC PT "6 Clicks" Mobility  Outcome Measure Help needed turning from your back to your side while in a flat bed without using bedrails?: A Little Help needed moving from lying on your back to sitting on the side of a flat bed without using bedrails?: A Little Help needed moving to and from a bed to a chair (including a wheelchair)?: A Little Help needed standing up from a chair using your arms (e.g., wheelchair or bedside chair)?: A Lot Help needed to walk in hospital room?: A Lot Help needed climbing 3-5 steps with a railing? : A Lot 6 Click Score: 15    End of Session Equipment Utilized During Treatment: Left knee immobilizer Activity Tolerance: Patient limited by pain Patient left: in bed;with call bell/phone within reach;with bed alarm set Nurse Communication: Mobility status PT Visit Diagnosis: Other abnormalities of gait and mobility (R26.89);Difficulty in walking, not elsewhere classified (R26.2)    Time: 3790-2409 PT Time Calculation (min) (ACUTE ONLY): 21 min   Charges:   PT Evaluation $PT Eval Moderate Complexity: 1 Mod     Mabeline Caras, PT, DPT Acute Rehabilitation Services  Pager [phone] Office [place] 02/26/2019, 5:45 PM

## 2019-02-26 NOTE — ED Notes (Signed)
Pt still in radiology at this time.

## 2019-02-26 NOTE — Discharge Summary (Signed)
PROGRESS NOTE    Jared Jared [name]  JKD:[id number] DOB: [birth year] DOA: 02/26/2019 PCP: [name], MD   Brief Narrative:  Jared Jared [name] is a 78 y.o. male with medical history significant of hypertension, stroke, GERD, depression, anxiety, benzo dependence, OSA not on CPAP, PAF, BPH, loop recorder placement, who presents with fall, right hip pain. Pt was recently hospitalized from 6/5-6/8 for left knee replacement on 6/5. He was just discharged to rehab facility yesterday. He states that he fell accidentally when he tried to turn the light on in facility, injured his right hip, causing severe pain.  The pain is constant, 10 out of 10 severity, sharp, nonradiating. He denies loss of consciousness.  No head or neck injury.  Patient denies chest pain, shortness breath, cough, fever or chills.  No nausea vomiting, diarrhea, abdominal pain, symptoms of UTI or unilateral weakness. He states that he started taking Xarelto yesterday, but does not remember at what time he took it. In ED pt was found to have WBC 10.6, negative COVID-19 test, electrolytes renal function okay, bradycardia with heart rate 41-45, no tachypnea, oxygen saturation 93% on room air, temperature 99.7.  Chest x-ray negative.  X-ray of left knee showed postsurgical change without acute issues.  CT head is negative for acute intracranial abnormalities.  CT of Jared [name]-spine is negative for acute bony fracture. X-ray of her right femur showed: Acute displaced intratrochanteric fracture of the proximal right femur. Patient is admitted to telemetry bed as inpatient. Dr. Veverly Fells of ortho was consulted.   Assessment & Plan:   Principal Problem:   Closed displaced intertrochanteric fracture of right femur ([place]) Active Problems:   Essential hypertension   GERD   BPH (benign prostatic hyperplasia)   Atrial fibrillation (HCC)   MDD (major depressive disorder), recurrent, in partial remission (HCC)   Benzodiazepine dependence ([place])   Status post total  knee replacement, left   Fall   Stroke [facility])   Closed displaced intertrochanteric fracture of right femur ([place]), POA S/P mechanical fall (loss of balance without syncope) -As noted on admission imaging -Orthopedic surgeon, Dr. Veverly Fells was consulted -defer to their expertise. -Unfortunately patient had taken Xarelto day prior to admission  - Pain control: morphine prn and percocet - When necessary Zofran for nausea - Robaxin for muscle spasm - Type and cross - INR/PTT - follow along with Ortho - PT/OT when able to (not ordered now)  Status post total knee replacement, left:  -replaced 02/22/19 - PT ongoing as tolerated to improve ROM/strength as patient remains bed bound from hip fx -No acute issues -pain controled well currently  Essential hypertension, well controlled -IV hydralazine as needed -Continue home meds: Coreg, Amlodipine  GERD, well controlled: -Protonix  BPH (benign prostatic hyperplasia): -dutasteride  Atrial fibrillation (PAF), rate controlled - without RVR:  CHA2DS2-VASc Score is 5, needs oral anticoagulation.  Continue to hold Xarelto perioperatively - defer to Ortho for re-initiation postoperatively INR checked and 2.0 on admission - unclear if this should be followed - defer to Ortho Borderline bradycardia in the setting of narcotics and coreg - continue lower dose at 3.125 (6.25 at admission)  MDD (major depressive disorder), recurrent, in partial remission, anxiey and Benzodiazepine dependence (HCC) -continue Xanax -Continue BuSpar Remeron  Hx of CVA, without deficits: -hold Xarelto now; not candidate for DAPT given anticoagulation  DVT prophylaxis: Xarelto, currently being held peri-operatively Code Status: Full Disposition Plan: Inpatient, pending surgical repair of right hip fracture, PT OT recommendations likely discharge back to skilled  nursing facility where patient was admitted from.  Consultants:   Ortho  Procedures:   Right hip  repair pending  Subjective: No acute issues or events overnight, right hip pain ongoing, moderately well controlled on current regimen, minimal muscle spasms noted overnight but otherwise patient feels quite well, obviously frustrated about recent readmission but otherwise no acute complaints.  Declines chest pain, shortness breath, nausea, vomiting, diarrhea, constipation, headache, fevers, chills.  Objective: Vitals:   02/26/19 0230 02/26/19 0341 02/26/19 0741 02/26/19 1533  BP: (!) 162/68 (!) 153/82 (!) 155/84 134/62  Pulse:  82 80 63  Resp:  18 18 18   Temp:  97.6 F (36.4 Jared [name]) 98 F (36.7 Jared [name]) 98.7 F (37.1 Jared [name])  TempSrc:  Oral Oral Oral  SpO2: 92% 95% 98% 99%    Intake/Output Summary (Last 24 hours) at 02/26/2019 1720 Last data filed at 02/26/2019 1600 Gross per 24 hour  Intake 0 ml  Output 325 ml  Net -325 ml   There were no vitals filed for this visit.  Examination:  General:  Pleasantly resting in bed, No acute distress. HEENT:  Normocephalic atraumatic.  Sclerae nonicteric, noninjected.  Extraocular movements intact bilaterally. Neck:  Without mass or deformity.  Trachea is midline. Lungs:  Clear to auscultate bilaterally without rhonchi, wheeze, or rales. Heart:  Regular rate and rhythm.  Without murmurs, rubs, or gallops. Abdomen:  Soft, nontender, nondistended.  Without guarding or rebound. Extremities: Left knee wrapped, in immobilizer; right lower extremity in traction. Vascular:  Dorsalis pedis and posterior tibial pulses palpable bilaterally. Skin:  Warm and dry, no erythema, no ulcerations.   Data Reviewed: I have personally reviewed following labs and imaging studies  CBC: Recent Labs  Lab 02/23/19 0355 02/24/19 0337 02/26/19 0040 02/26/19 0441  WBC 12.9* 13.0* 10.6* 11.2*  NEUTROABS  --   --  7.6  --   HGB 12.9* 13.3 12.3* 11.6*  HCT 37.8* 40.2 36.3* 33.4*  MCV 92.4 94.1 91.2 89.8  PLT 193 202 201 741   Basic Metabolic Panel: Recent Labs  Lab  02/23/19 0355 02/26/19 0040 02/26/19 0441  NA 138 137 139  K 4.1 3.8 4.2  CL 107 104 105  CO2 22 23 26   GLUCOSE 166* 149* 143*  BUN 19 25* 24*  CREATININE 1.02 1.02 1.06  CALCIUM 8.7* 8.8* 8.8*   GFR: Estimated Creatinine Clearance: 67.8 mL/min (by Jared [name]-G formula based on SCr of 1.06 mg/dL). Liver Function Tests: No results for input(s): AST, ALT, ALKPHOS, BILITOT, PROT, ALBUMIN in the last 168 hours. No results for input(s): LIPASE, AMYLASE in the last 168 hours. No results for input(s): AMMONIA in the last 168 hours. Coagulation Profile: Recent Labs  Lab 02/26/19 0441  INR 2.0*   Cardiac Enzymes: No results for input(s): CKTOTAL, CKMB, CKMBINDEX, TROPONINI in the last 168 hours. BNP (last 3 results) No results for input(s): PROBNP in the last 8760 hours. HbA1C: No results for input(s): HGBA1C in the last 72 hours. CBG: No results for input(s): GLUCAP in the last 168 hours. Lipid Profile: No results for input(s): CHOL, HDL, LDLCALC, TRIG, CHOLHDL, LDLDIRECT in the last 72 hours. Thyroid Function Tests: No results for input(s): TSH, T4TOTAL, FREET4, T3FREE, THYROIDAB in the last 72 hours. Anemia Panel: No results for input(s): VITAMINB12, FOLATE, FERRITIN, TIBC, IRON, RETICCTPCT in the last 72 hours. Sepsis Labs: No results for input(s): PROCALCITON, LATICACIDVEN in the last 168 hours.  Recent Results (from the past 240 hour(s))  Novel Coronavirus, NAA (hospital order; send-out to  ref lab)     Status: None   Collection Time: 02/19/19  9:32 AM  Result Value Ref Range Status   SARS-CoV-2, NAA NOT DETECTED NOT DETECTED Final    Comment: (NOTE) This test was developed and its performance characteristics determined by [employer]. This test has not been FDA cleared or approved. This test has been authorized by FDA under an Emergency Use Authorization (EUA). This test is only authorized for the duration of time the declaration that circumstances exist justifying  the authorization of the emergency use of in vitro diagnostic tests for detection of SARS-CoV-2 virus and/or diagnosis of COVID-19 infection under section 564(b)(1) of the Act, 21 U.S.Jared [name]. 818HUD-1(S)(9), unless the authorization is terminated or revoked sooner. When diagnostic testing is negative, the possibility of a false negative result should be considered in the context of a patient's recent exposures and the presence of clinical signs and symptoms consistent with COVID-19. An individual without symptoms of COVID-19 and who is not shedding SARS-CoV-2 virus would expect to have a negative (not detected) result in this assay. Performed  At: [facility] [address] [place], [place] [id number] Rush Farmer MD IF:[id number]    [place]  Final    Comment: Performed at [facility], [place] [address] [place], [place] [address]  SARS Coronavirus 2 (CEPHEID - Performed in [place] hospital lab), Hosp Order     Status: None   Collection Time: 02/26/19  1:58 AM  Result Value Ref Range Status   SARS Coronavirus 2 NEGATIVE NEGATIVE Final    Comment: (NOTE) If result is NEGATIVE SARS-CoV-2 target nucleic acids are NOT DETECTED. The SARS-CoV-2 RNA is generally detectable in upper and lower  respiratory specimens during the acute phase of infection. The lowest  concentration of SARS-CoV-2 viral copies this assay can detect is 250  copies / mL. A negative result does not preclude SARS-CoV-2 infection  and should not be used as the sole basis for treatment or other  patient management decisions.  A negative result may occur with  improper specimen collection / handling, submission of specimen other  than nasopharyngeal swab, presence of viral mutation(s) within the  areas targeted by this assay, and inadequate number of viral copies  (<250 copies / mL). A negative result must be combined with clinical  observations, patient history, and  epidemiological information. If result is POSITIVE SARS-CoV-2 target nucleic acids are DETECTED. The SARS-CoV-2 RNA is generally detectable in upper and lower  respiratory specimens dur ing the acute phase of infection.  Positive  results are indicative of active infection with SARS-CoV-2.  Clinical  correlation with patient history and other diagnostic information is  necessary to determine patient infection status.  Positive results do  not rule out bacterial infection or co-infection with other viruses. If result is PRESUMPTIVE POSTIVE SARS-CoV-2 nucleic acids MAY BE PRESENT.   A presumptive positive result was obtained on the submitted specimen  and confirmed on repeat testing.  While 2019 novel coronavirus  (SARS-CoV-2) nucleic acids may be present in the submitted sample  additional confirmatory testing may be necessary for epidemiological  and / or clinical management purposes  to differentiate between  SARS-CoV-2 and other Sarbecovirus currently known to infect humans.  If clinically indicated additional testing with an alternate test  methodology [phone]) is advised. The SARS-CoV-2 RNA is generally  detectable in upper and lower respiratory sp ecimens during the acute  phase of infection. The expected result is Negative. Fact Sheet for  Patients:  StrictlyIdeas.no Fact Sheet for Healthcare Providers: BankingDealers.co.za This test is not yet approved or cleared by the [place] FDA and has been authorized for detection and/or diagnosis of SARS-CoV-2 by FDA under an Emergency Use Authorization (EUA).  This EUA will remain in effect (meaning this test can be used) for the duration of the COVID-19 declaration under Section 564(b)(1) of the Act, 21 U.S.Jared [name]. section 360bbb-3(b)(1), unless the authorization is terminated or revoked sooner. Performed at [facility], [place] [address], [place], [place] [address]           Radiology Studies: Dg Chest 1 View  Result Date: 02/26/2019 CLINICAL DATA:  Pain status post fall. EXAM: CHEST  1 VIEW COMPARISON:  None. FINDINGS: The heart size is enlarged. The patient is status post placement of a leadless pacemaker. There is no pneumothorax. No large pleural effusion. The lungs are essentially clear. There is no evidence of an acute osseous abnormality. Aortic calcifications are noted. Pleuroparenchymal scarring is noted at the lung apices. IMPRESSION: No active disease. Electronically Signed   By: Constance Holster M.D.   On: 02/26/2019 01:39   Ct Head Wo Contrast  Result Date: 02/26/2019 CLINICAL DATA:  Head trauma. EXAM: CT HEAD WITHOUT CONTRAST CT CERVICAL SPINE WITHOUT CONTRAST TECHNIQUE: Multidetector CT imaging of the head and cervical spine was performed following the standard protocol without intravenous contrast. Multiplanar CT image reconstructions of the cervical spine were also generated. COMPARISON:  None. FINDINGS: CT HEAD FINDINGS Brain: No evidence of acute infarction, hemorrhage, hydrocephalus, extra-axial collection or mass lesion/mass effect. There is symmetric volume loss which is greater than expected for the patient's age. There are chronic microvascular ischemic changes bilaterally. Vascular: No hyperdense vessel or unexpected calcification. Skull: Normal. Negative for fracture or focal lesion. Sinuses/Orbits: There is maxillary mucosal thickening bilaterally. There is ethmoid air cell mucosal thickening bilaterally. The remaining paranasal sinuses and mastoid air cells are essentially clear. The patient is status post bilateral cataract surgery. Other: None. CT CERVICAL SPINE FINDINGS Alignment: Normal. Skull base and vertebrae: No acute fracture. No primary bone lesion or focal pathologic process. Soft tissues and spinal canal: No prevertebral fluid or swelling. No visible canal hematoma. Disc levels: There is mild multilevel disc height loss noted  throughout the cervical spine greatest at the C6-C7 and C7-T1 levels. Upper chest: Negative. Other: None IMPRESSION: 1. No acute intracranial abnormality detected. 2. No cervical spine fracture. 3. Moderate volume loss, greater than expected for the patient's age. There are chronic microvascular ischemic changes. Electronically Signed   By: Constance Holster M.D.   On: 02/26/2019 01:33   Ct Cervical Spine Wo Contrast  Result Date: 02/26/2019 CLINICAL DATA:  Head trauma. EXAM: CT HEAD WITHOUT CONTRAST CT CERVICAL SPINE WITHOUT CONTRAST TECHNIQUE: Multidetector CT imaging of the head and cervical spine was performed following the standard protocol without intravenous contrast. Multiplanar CT image reconstructions of the cervical spine were also generated. COMPARISON:  None. FINDINGS: CT HEAD FINDINGS Brain: No evidence of acute infarction, hemorrhage, hydrocephalus, extra-axial collection or mass lesion/mass effect. There is symmetric volume loss which is greater than expected for the patient's age. There are chronic microvascular ischemic changes bilaterally. Vascular: No hyperdense vessel or unexpected calcification. Skull: Normal. Negative for fracture or focal lesion. Sinuses/Orbits: There is maxillary mucosal thickening bilaterally. There is ethmoid air cell mucosal thickening bilaterally. The remaining paranasal sinuses and mastoid air cells are essentially clear. The patient is status post bilateral cataract surgery. Other: None. CT CERVICAL SPINE FINDINGS  Alignment: Normal. Skull base and vertebrae: No acute fracture. No primary bone lesion or focal pathologic process. Soft tissues and spinal canal: No prevertebral fluid or swelling. No visible canal hematoma. Disc levels: There is mild multilevel disc height loss noted throughout the cervical spine greatest at the C6-C7 and C7-T1 levels. Upper chest: Negative. Other: None IMPRESSION: 1. No acute intracranial abnormality detected. 2. No cervical spine  fracture. 3. Moderate volume loss, greater than expected for the patient's age. There are chronic microvascular ischemic changes. Electronically Signed   By: Constance Holster M.D.   On: 02/26/2019 01:33   Dg Knee Complete 4 Views Left  Result Date: 02/26/2019 CLINICAL DATA:  Pain status post fall EXAM: LEFT KNEE - COMPLETE 4+ VIEW COMPARISON:  None. FINDINGS: The patient is status post remote total knee arthroplasty on the left. The hardware appears intact. The alignment appears normal. There is no evidence of a periprosthetic fracture. There appears to be a moderate to large joint effusion. IMPRESSION: 1. No acute displaced fracture or dislocation. 2. Postsurgical changes related to recent left total knee arthroplasty without evidence of a periprosthetic fracture. A large joint effusion is noted which is likely related to the recent surgical intervention. Electronically Signed   By: Constance Holster M.D.   On: 02/26/2019 01:43   Dg Hip Unilat W Or Wo Pelvis 2-3 Views Right  Result Date: 02/26/2019 CLINICAL DATA:  Pain EXAM: DG HIP (WITH OR WITHOUT PELVIS) 2-3V RIGHT COMPARISON:  None. FINDINGS: There is an acute displaced intratrochanteric fracture of the proximal right fume ir. There is a large medially displaced fracture fragment. There is no dislocation. There is osteopenia. There are mild degenerative changes of both femoroacetabular joints. IMPRESSION: Acute comminuted displaced intratrochanteric fracture of the proximal right femur. Electronically Signed   By: Constance Holster M.D.   On: 02/26/2019 01:40   Dg Femur Min 2 Views Right  Result Date: 02/26/2019 CLINICAL DATA:  Fracture EXAM: RIGHT FEMUR 2 VIEWS COMPARISON:  None. FINDINGS: There is an acute comminuted displaced intratrochanteric fracture of the proximal right femur. There is no dislocation. There are degenerative changes of the right knee. There is no distal femur fracture. IMPRESSION: Acute displaced intratrochanteric fracture of  the proximal right femur. Electronically Signed   By: Constance Holster M.D.   On: 02/26/2019 01:44        Scheduled Meds: . amLODipine  10 mg Oral Daily  . buPROPion  150 mg Oral BID  . carvedilol  3.125 mg Oral BID WC  . cycloSPORINE  1 drop Both Eyes BID  . dutasteride  0.5 mg Oral Daily  . mirtazapine  15 mg Oral QHS  . multivitamin with minerals  1 tablet Oral Daily  . pantoprazole  40 mg Oral Daily  . timolol  1 drop Both Eyes BID   Continuous Infusions:   LOS: 0 days   Time spent: 52min  Jared Jared [name] Jared Lyles, DO [facility]  If 7PM-7AM, please contact night-coverage [email or web address] Password [email or web address] 02/26/2019, 5:20 PM

## 2019-02-26 NOTE — ED Provider Notes (Signed)
[place] EMERGENCY DEPARTMENT Provider Note   CSN: [id number] Arrival date & time: 02/26/19  0028    History   Chief Complaint Chief Complaint  Patient presents with  . Fall  . Hip Injury    HPI Jared [name] is a 78 y.o. male.     Patient from nursing facility after mechanical fall.  States he was trying to walk using a chair instead of a walker and fell onto his right hip.  He denies hitting his head or losing consciousness.  EMS reports right leg shortened and externally rotated.  Does take Xarelto for history of atrial fibrillation.  Patient just discharged from [facility] yesterday after having a left-sided knee replacement.  He denies injuring his left leg during the fall.  He states he fell from standing while trying to transfer onto his right hip.  He does not think he hit his head but does have some pain in his neck.  No chest pain or shortness of breath.  No abdominal pain, nausea or vomiting.  No back pain.  No focal weakness, numbness or tingling.  The history is provided by the patient and the EMS personnel.  Fall  Pertinent negatives include no chest pain, no abdominal pain and no headaches.    Past Medical History:  Diagnosis Date  . Allergic rhinitis   . Anticoagulant long-term use    xarelto  . Anxiety   . Benign localized prostatic hyperplasia with lower urinary tract symptoms (LUTS)   . Benzodiazepine dependence ([place])   . Edema of right lower extremity   . GERD (gastroesophageal reflux disease)   . Heart murmur   . Hematuria   . Hepatitis    History of  . History of colonic polyps   . History of CVA (cerebrovascular accident) without residual deficits 07/26/2017   crytogenic stroke -- acute left caudate body infarct   . History of kidney stones   . Hypertension   . Ischemic cardiomyopathy 07/26/2017   ef 35-40%;    TEE 07-31-2017 ef 60-65%  . MDD (major depressive disorder), recurrent, in partial remission ([place])   . OA  (osteoarthritis)    knees,   . OSA (obstructive sleep apnea)    intolerant cpap  . PAF (paroxysmal atrial fibrillation) [facility])    cardiologist-  dr g. taylor  . Skin cancer    Childhood, right foot on toe  . Status post placement of implantable loop recorder 07/31/2017  . Stroke ([place])   . Swelling of right foot 12/2018  . Throat mass   . Wears glasses     Patient Active Problem List   Diagnosis Date Noted  . Status post total knee replacement, left 02/22/2019  . Swelling of right foot 01/04/2019  . Preoperative evaluation to rule out surgical contraindication 11/14/2018  . Hematuria 10/28/2018  . Osteoarthritis of right knee 06/20/2018  . Atrial fibrillation ([place])   . MDD (major depressive disorder), recurrent, in partial remission ([place])   . Benzodiazepine dependence ([place])   . Ejection fraction < 50% 11/07/2017  . Hyperlipidemia   . Dizziness 07/27/2017  . Frequent PVCs 07/27/2017  . Cryptogenic stroke ([place]) 07/27/2017  . Diverticulosis of colon without hemorrhage 02/06/2013  . Right inguinal hernia 10/12/2012  . BPH (benign prostatic hyperplasia) 04/08/2009  . OSA (obstructive sleep apnea) 10/02/2008  . Essential hypertension 10/05/2007  . Allergic rhinitis 10/05/2007  . GERD 10/05/2007  . History of colonic polyps 10/05/2007  . NEPHROLITHIASIS, HX OF 10/05/2007  Past Surgical History:  Procedure Laterality Date  . APPENDECTOMY  1970s  . CATARACT EXTRACTION W/ INTRAOCULAR LENS  IMPLANT, BILATERAL Bilateral 2014  . CHOLECYSTECTOMY  1970s  . COLONOSCOPY    . CYSTOSCOPY    . FOOT SURGERY Left yrs ago  . HERNIA REPAIR  2014  . LOOP RECORDER INSERTION N/A 07/31/2017   Procedure: LOOP RECORDER INSERTION;  Surgeon: Evans Lance, MD;  Location: [place] [facility];  Service: Cardiovascular;  Laterality: N/A;  . TEE WITHOUT CARDIOVERSION N/A 07/31/2017   Procedure: TRANSESOPHAGEAL ECHOCARDIOGRAM (TEE) WITH LOOP;  Surgeon: Dorothy Spark, MD;  Location: [facility];  Service: Cardiovascular;  Laterality: N/A;  . TOTAL KNEE ARTHROPLASTY Left 02/22/2019   Procedure: TOTAL KNEE ARTHROPLASTY;  Surgeon: [name], MD;  Location: [facility];  Service: Orthopedics;  Laterality: Left;  . WRIST FRACTURE SURGERY  1970s   right x2  unsure if any hardware remain   ;    left x1 with pinning then removed        Home Medications    Prior to Admission medications   Medication Sig Start Date End Date Taking? Authorizing Provider  acetaminophen (TYLENOL) 325 MG tablet Take 325 mg by mouth every 6 (six) hours as needed for moderate pain or headache.    [provider]  ALPRAZolam (XANAX) 1 MG tablet TAKE 0.5 TABLETS (0.5 MG TOTAL) BY MOUTH 3 (THREE) TIMES DAILY AS NEEDED FOR ANXIETY. 02/15/19   Viviana Simpler I, MD  amLODipine (NORVASC) 10 MG tablet Take 1 tablet (10 mg total) by mouth daily. 08/23/18   [name], MD  buPROPion (WELLBUTRIN SR) 150 MG 12 hr tablet TAKE 1 TABLET BY MOUTH TWICE A DAY Patient taking differently: Take 150 mg by mouth 2 (two) times daily.  11/16/18   [name], MD  carvedilol (COREG) 6.25 MG tablet Take 1 tablet (6.25 mg total) by mouth 2 (two) times daily with a meal. 05/07/18   Marletta Lor, MD  dutasteride (AVODART) 0.5 MG capsule Take 1 capsule (0.5 mg total) by mouth daily. 09/17/18   [name], MD  Menthol, Topical Analgesic, (ICY HOT EX) Apply 1 application topically daily as needed (pain).    [provider]  methocarbamol (ROBAXIN) 500 MG tablet Take 1 tablet (500 mg total) by mouth 3 (three) times daily as needed. 02/25/19   [name], MD  mirtazapine (REMERON) 15 MG tablet TAKE 1 TABLET BY MOUTH EVERYDAY AT BEDTIME Patient taking differently: Take 15 mg by mouth at bedtime.  01/14/19   [name], MD  Multiple Vitamin (MULTIVITAMIN WITH MINERALS) TABS tablet Take 1 tablet by mouth daily.    [provider]  omeprazole (PRILOSEC) 10 MG capsule Take 1 capsule (10  mg total) by mouth daily. 02/21/19   [name], MD  oxyCODONE-acetaminophen (PERCOCET) 5-325 MG tablet Take 1 tablet by mouth every 6 (six) hours as needed for moderate pain or severe pain. 02/25/19 02/25/20  [name], MD  Propylene Glycol (SYSTANE BALANCE) 0.6 % SOLN Place 1 drop into both eyes daily as needed (dry eyes).    [provider]  RESTASIS 0.05 % ophthalmic emulsion Place 1 drop 2 (two) times daily into both eyes. 08/03/17   [provider]  rivaroxaban (XARELTO) 20 MG TABS tablet Take 1 tablet (20 mg total) by mouth daily with supper. 02/25/19   [name], MD  timolol (TIMOPTIC) 0.5 % ophthalmic solution Place 1 drop into both eyes  2 (two) times daily.  06/29/17   [provider]  valsartan (DIOVAN) 80 MG tablet Take 1 tablet (80 mg total) by mouth daily. 09/17/18   [name], MD    Family History Family History  Problem Relation Age of Onset  . Heart disease Mother   . Heart disease Father   . Colon cancer Maternal Aunt 70    Social History Social History   Tobacco Use  . Smoking status: Never Smoker  . Smokeless tobacco: Never Used  Substance Use Topics  . Alcohol use: No  . Drug use: Never     Allergies   Antihistamines, diphenhydramine-type; Aspirin; Codeine; and Pollen extract   Review of Systems Review of Systems  Constitutional: Negative for activity change, appetite change and fever.  HENT: Negative for congestion and rhinorrhea.   Eyes: Negative for visual disturbance.  Respiratory: Negative for chest tightness.   Cardiovascular: Positive for leg swelling. Negative for chest pain.  Gastrointestinal: Negative for abdominal pain, nausea and vomiting.  Genitourinary: Negative for dysuria and hematuria.  Musculoskeletal: Positive for arthralgias and myalgias.  Skin: Negative for rash.  Neurological: Negative for dizziness, weakness and headaches.   all other systems are negative except as noted in the HPI and  PMH.     Physical Exam Updated Vital Signs BP (!) 153/82 (BP Location: Left Arm)   Pulse 82   Temp 97.6 F (36.4 C) (Oral)   Resp 18   SpO2 95%   Physical Exam Vitals signs and nursing note reviewed.  Constitutional:      General: He is not in acute distress.    Appearance: He is well-developed.  HENT:     Head: Normocephalic and atraumatic.     Mouth/Throat:     Pharynx: No oropharyngeal exudate.     Comments: Anterior throat mass that patient states is unchanged and benign Eyes:     Conjunctiva/sclera: Conjunctivae normal.     Pupils: Pupils are equal, round, and reactive to light.  Neck:     Musculoskeletal: Normal range of motion and neck supple.     Comments: No C-spine tenderness Cardiovascular:     Rate and Rhythm: Normal rate and regular rhythm.     Heart sounds: Normal heart sounds. No murmur.  Pulmonary:     Effort: Pulmonary effort is normal. No respiratory distress.     Breath sounds: Normal breath sounds.  Abdominal:     Palpations: Abdomen is soft.     Tenderness: There is no abdominal tenderness. There is no guarding or rebound.  Musculoskeletal: Normal range of motion.        General: Tenderness, deformity and signs of injury present.     Right lower leg: Edema present.     Comments: Right leg is shortened and externally rotated.  Exquisite tenderness to palpation of the right hip.  Unable to move the leg.  Intact DP and PT pulses.  Knee immobilizer intact to left leg  Skin:    General: Skin is warm.     Capillary Refill: Capillary refill takes less than 2 seconds.     Findings: No rash.  Neurological:     General: No focal deficit present.     Mental Status: He is alert and oriented to person, place, and time. Mental status is at baseline.     Cranial Nerves: No cranial nerve deficit.     Motor: No abnormal muscle tone.     Coordination: Coordination normal.     Comments:  No ataxia on finger to nose bilaterally. No pronator drift. 5/5 strength  throughout. CN 2-12 intact.Equal grip strength. Sensation intact.   Psychiatric:        Behavior: Behavior normal.      ED Treatments / Results  Labs (all labs ordered are listed, but only abnormal results are displayed) Labs Reviewed  CBC WITH DIFFERENTIAL/PLATELET - Abnormal; Notable for the following components:      Result Value   WBC 10.6 (*)    RBC 3.98 (*)    Hemoglobin 12.3 (*)    HCT 36.3 (*)    Monocytes Absolute 1.3 (*)    Abs Immature Granulocytes 0.09 (*)    All other components within normal limits  BASIC METABOLIC PANEL - Abnormal; Notable for the following components:   Glucose, Bld 149 (*)    BUN 25 (*)    Calcium 8.8 (*)    All other components within normal limits  SARS CORONAVIRUS 2 (HOSPITAL ORDER, [facility])    EKG EKG Interpretation  Date/Time:  Tuesday February 26 2019 00:36:04 EDT Ventricular Rate:  89 PR Interval:    QRS Duration: 112 QT Interval:  386 QTC Calculation: 470 R Axis:   -46 Text Interpretation:  Sinus rhythm Atrial premature complexes Left anterior fascicular block Abnormal R-wave progression, late transition Left ventricular hypertrophy No significant change was found Confirmed by Ezequiel Essex [phone]) on 02/26/2019 1:12:58 AM   Radiology Dg Chest 1 View  Result Date: 02/26/2019 CLINICAL DATA:  Pain status post fall. EXAM: CHEST  1 VIEW COMPARISON:  None. FINDINGS: The heart size is enlarged. The patient is status post placement of a leadless pacemaker. There is no pneumothorax. No large pleural effusion. The lungs are essentially clear. There is no evidence of an acute osseous abnormality. Aortic calcifications are noted. Pleuroparenchymal scarring is noted at the lung apices. IMPRESSION: No active disease. Electronically Signed   By: Constance Holster M.D.   On: 02/26/2019 01:39   Ct Head Wo Contrast  Result Date: 02/26/2019 CLINICAL DATA:  Head trauma. EXAM: CT HEAD WITHOUT CONTRAST CT CERVICAL SPINE  WITHOUT CONTRAST TECHNIQUE: Multidetector CT imaging of the head and cervical spine was performed following the standard protocol without intravenous contrast. Multiplanar CT image reconstructions of the cervical spine were also generated. COMPARISON:  None. FINDINGS: CT HEAD FINDINGS Brain: No evidence of acute infarction, hemorrhage, hydrocephalus, extra-axial collection or mass lesion/mass effect. There is symmetric volume loss which is greater than expected for the patient's age. There are chronic microvascular ischemic changes bilaterally. Vascular: No hyperdense vessel or unexpected calcification. Skull: Normal. Negative for fracture or focal lesion. Sinuses/Orbits: There is maxillary mucosal thickening bilaterally. There is ethmoid air cell mucosal thickening bilaterally. The remaining paranasal sinuses and mastoid air cells are essentially clear. The patient is status post bilateral cataract surgery. Other: None. CT CERVICAL SPINE FINDINGS Alignment: Normal. Skull base and vertebrae: No acute fracture. No primary bone lesion or focal pathologic process. Soft tissues and spinal canal: No prevertebral fluid or swelling. No visible canal hematoma. Disc levels: There is mild multilevel disc height loss noted throughout the cervical spine greatest at the C6-C7 and C7-T1 levels. Upper chest: Negative. Other: None IMPRESSION: 1. No acute intracranial abnormality detected. 2. No cervical spine fracture. 3. Moderate volume loss, greater than expected for the patient's age. There are chronic microvascular ischemic changes. Electronically Signed   By: Constance Holster M.D.   On: 02/26/2019 01:33   Ct Cervical Spine Wo Contrast  Result Date: 02/26/2019 CLINICAL DATA:  Head trauma. EXAM: CT HEAD WITHOUT CONTRAST CT CERVICAL SPINE WITHOUT CONTRAST TECHNIQUE: Multidetector CT imaging of the head and cervical spine was performed following the standard protocol without intravenous contrast. Multiplanar CT image  reconstructions of the cervical spine were also generated. COMPARISON:  None. FINDINGS: CT HEAD FINDINGS Brain: No evidence of acute infarction, hemorrhage, hydrocephalus, extra-axial collection or mass lesion/mass effect. There is symmetric volume loss which is greater than expected for the patient's age. There are chronic microvascular ischemic changes bilaterally. Vascular: No hyperdense vessel or unexpected calcification. Skull: Normal. Negative for fracture or focal lesion. Sinuses/Orbits: There is maxillary mucosal thickening bilaterally. There is ethmoid air cell mucosal thickening bilaterally. The remaining paranasal sinuses and mastoid air cells are essentially clear. The patient is status post bilateral cataract surgery. Other: None. CT CERVICAL SPINE FINDINGS Alignment: Normal. Skull base and vertebrae: No acute fracture. No primary bone lesion or focal pathologic process. Soft tissues and spinal canal: No prevertebral fluid or swelling. No visible canal hematoma. Disc levels: There is mild multilevel disc height loss noted throughout the cervical spine greatest at the C6-C7 and C7-T1 levels. Upper chest: Negative. Other: None IMPRESSION: 1. No acute intracranial abnormality detected. 2. No cervical spine fracture. 3. Moderate volume loss, greater than expected for the patient's age. There are chronic microvascular ischemic changes. Electronically Signed   By: Constance Holster M.D.   On: 02/26/2019 01:33   Dg Knee Complete 4 Views Left  Result Date: 02/26/2019 CLINICAL DATA:  Pain status post fall EXAM: LEFT KNEE - COMPLETE 4+ VIEW COMPARISON:  None. FINDINGS: The patient is status post remote total knee arthroplasty on the left. The hardware appears intact. The alignment appears normal. There is no evidence of a periprosthetic fracture. There appears to be a moderate to large joint effusion. IMPRESSION: 1. No acute displaced fracture or dislocation. 2. Postsurgical changes related to recent left  total knee arthroplasty without evidence of a periprosthetic fracture. A large joint effusion is noted which is likely related to the recent surgical intervention. Electronically Signed   By: Constance Holster M.D.   On: 02/26/2019 01:43   Dg Hip Unilat W Or Wo Pelvis 2-3 Views Right  Result Date: 02/26/2019 CLINICAL DATA:  Pain EXAM: DG HIP (WITH OR WITHOUT PELVIS) 2-3V RIGHT COMPARISON:  None. FINDINGS: There is an acute displaced intratrochanteric fracture of the proximal right fume ir. There is a large medially displaced fracture fragment. There is no dislocation. There is osteopenia. There are mild degenerative changes of both femoroacetabular joints. IMPRESSION: Acute comminuted displaced intratrochanteric fracture of the proximal right femur. Electronically Signed   By: Constance Holster M.D.   On: 02/26/2019 01:40   Dg Femur Min 2 Views Right  Result Date: 02/26/2019 CLINICAL DATA:  Fracture EXAM: RIGHT FEMUR 2 VIEWS COMPARISON:  None. FINDINGS: There is an acute comminuted displaced intratrochanteric fracture of the proximal right femur. There is no dislocation. There are degenerative changes of the right knee. There is no distal femur fracture. IMPRESSION: Acute displaced intratrochanteric fracture of the proximal right femur. Electronically Signed   By: Constance Holster M.D.   On: 02/26/2019 01:44    Procedures Procedures (including critical care time)  Medications Ordered in ED Medications - No data to display   Initial Impression / Assessment and Plan / ED Course  I have reviewed the triage vital signs and the nursing notes.  Pertinent labs & imaging results that were available during my care of the  patient were reviewed by me and considered in my medical decision making (see chart for details).       Mechanical fall with right hip injury.  Concern for fracture.  Patient just had left-sided knee replacement but does not believe he damaged this.  Did not hit head but does take  xarelto.  CT head and C-spine negative.  X-ray confirms right intertrochanteric fracture with displacement of fracture fragments.  He is neurovascularly intact.  Discussed with his orthopedic surgeon Dr. Veverly Fells who will plan for operative repair later today.  Admission to the hospitalist service. Hold xarelto.  D/w Dr. Blaine Hamper.   Final Clinical Impressions(s) / ED Diagnoses   Final diagnoses:  Closed right hip fracture, initial encounter [facility])    ED Discharge Orders    None       Ezequiel Essex, MD 02/26/19 [phone]

## 2019-02-27 ENCOUNTER — Inpatient Hospital Stay (HOSPITAL_COMMUNITY): Payer: PPO

## 2019-02-27 ENCOUNTER — Encounter (HOSPITAL_COMMUNITY)
Admission: EM | Disposition: A | Discharge status: Skilled Nursing Facility | Payer: Self-pay | Source: Skilled Nursing Facility | Attending: Internal Medicine

## 2019-02-27 ENCOUNTER — Inpatient Hospital Stay (HOSPITAL_COMMUNITY): Payer: PPO | Admitting: Certified Registered Nurse Anesthetist

## 2019-02-27 DIAGNOSIS — S72141A Displaced intertrochanteric fracture of right femur, initial encounter for closed fracture: Secondary | ICD-10-CM | POA: Diagnosis present

## 2019-02-27 HISTORY — PX: FEMUR IM NAIL: SHX1597

## 2019-02-27 LAB — SURGICAL PCR SCREEN
MRSA, PCR: NEGATIVE
Staphylococcus aureus: NEGATIVE

## 2019-02-27 LAB — TYPE AND SCREEN
ABO/RH(D): O POS
Antibody Screen: NEGATIVE

## 2019-02-27 SURGERY — INSERTION, INTRAMEDULLARY ROD, FEMUR
Anesthesia: General | Laterality: Right

## 2019-02-27 MED ORDER — OXYCODONE HCL 5 MG PO TABS: 5.0000 mg | ORAL_TABLET | Freq: Once | ORAL | Status: DC | PRN

## 2019-02-27 MED ORDER — CEFAZOLIN SODIUM-DEXTROSE 2-4 GM/100ML-% IV SOLN
INTRAVENOUS | Status: AC
  Filled 2019-02-27: qty 100

## 2019-02-27 MED ORDER — PROPOFOL 10 MG/ML IV BOLUS
INTRAVENOUS | Status: DC | PRN
  Administered 2019-02-27: 100 mg via INTRAVENOUS

## 2019-02-27 MED ORDER — FENTANYL CITRATE (PF) 100 MCG/2ML IJ SOLN
25.0000 ug | INTRAMUSCULAR | Status: DC | PRN
  Administered 2019-02-27 (×2): 25 ug via INTRAVENOUS

## 2019-02-27 MED ORDER — SUCCINYLCHOLINE CHLORIDE 200 MG/10ML IV SOSY
PREFILLED_SYRINGE | INTRAVENOUS | Status: DC | PRN
  Administered 2019-02-27: 120 mg via INTRAVENOUS

## 2019-02-27 MED ORDER — TRANEXAMIC ACID-NACL 1000-0.7 MG/100ML-% IV SOLN
INTRAVENOUS | Status: AC
  Filled 2019-02-27: qty 100

## 2019-02-27 MED ORDER — FENTANYL CITRATE (PF) 100 MCG/2ML IJ SOLN
INTRAMUSCULAR | Status: AC
  Filled 2019-02-27: qty 2

## 2019-02-27 MED ORDER — LACTATED RINGERS IV SOLN
INTRAVENOUS | Status: DC | PRN
  Administered 2019-02-27: 15:00:00 via INTRAVENOUS

## 2019-02-27 MED ORDER — ONDANSETRON HCL 4 MG/2ML IJ SOLN: 4.0000 mg | Freq: Four times a day (QID) | INTRAMUSCULAR | Status: DC | PRN

## 2019-02-27 MED ORDER — DEXAMETHASONE SODIUM PHOSPHATE 10 MG/ML IJ SOLN
INTRAMUSCULAR | Status: DC | PRN
  Administered 2019-02-27: 5 mg via INTRAVENOUS

## 2019-02-27 MED ORDER — METOCLOPRAMIDE HCL 5 MG PO TABS: 5.0000 mg | ORAL_TABLET | Freq: Three times a day (TID) | ORAL | Status: DC | PRN

## 2019-02-27 MED ORDER — PHENOL 1.4 % MT LIQD: 1.0000 | OROMUCOSAL | Status: DC | PRN

## 2019-02-27 MED ORDER — CHLORHEXIDINE GLUCONATE 4 % EX LIQD
60.0000 mL | Freq: Once | CUTANEOUS | Status: AC
  Administered 2019-02-27: 4 via TOPICAL

## 2019-02-27 MED ORDER — FENTANYL CITRATE (PF) 250 MCG/5ML IJ SOLN
INTRAMUSCULAR | Status: DC | PRN
  Administered 2019-02-27: 50 ug via INTRAVENOUS
  Administered 2019-02-27 (×2): 100 ug via INTRAVENOUS

## 2019-02-27 MED ORDER — PROPOFOL 10 MG/ML IV BOLUS
INTRAVENOUS | Status: AC
  Filled 2019-02-27: qty 20

## 2019-02-27 MED ORDER — CEFAZOLIN SODIUM-DEXTROSE 2-4 GM/100ML-% IV SOLN
2.0000 g | INTRAVENOUS | Status: AC
  Administered 2019-02-27: 2 g via INTRAVENOUS

## 2019-02-27 MED ORDER — SUCCINYLCHOLINE CHLORIDE 200 MG/10ML IV SOSY
PREFILLED_SYRINGE | INTRAVENOUS | Status: AC
  Filled 2019-02-27: qty 10

## 2019-02-27 MED ORDER — OXYCODONE HCL 5 MG/5ML PO SOLN: 5.0000 mg | Freq: Once | ORAL | Status: DC | PRN

## 2019-02-27 MED ORDER — MIDAZOLAM HCL 2 MG/2ML IJ SOLN
INTRAMUSCULAR | Status: AC
  Administered 2019-02-27: 19:00:00 0.5 mg via INTRAVENOUS
  Filled 2019-02-27: qty 2

## 2019-02-27 MED ORDER — ENOXAPARIN SODIUM 40 MG/0.4ML ~~LOC~~ SOLN
40.0000 mg | SUBCUTANEOUS | Status: DC
  Administered 2019-02-28: 09:00:00 40 mg via SUBCUTANEOUS
  Filled 2019-02-27: qty 0.4

## 2019-02-27 MED ORDER — ONDANSETRON HCL 4 MG/2ML IJ SOLN
INTRAMUSCULAR | Status: AC
  Filled 2019-02-27: qty 2

## 2019-02-27 MED ORDER — LIDOCAINE 2% (20 MG/ML) 5 ML SYRINGE
INTRAMUSCULAR | Status: AC
  Filled 2019-02-27: qty 5

## 2019-02-27 MED ORDER — MIDAZOLAM HCL 2 MG/2ML IJ SOLN
0.5000 mg | Freq: Once | INTRAMUSCULAR | Status: AC
  Administered 2019-02-27: 19:00:00 0.5 mg via INTRAVENOUS

## 2019-02-27 MED ORDER — DOCUSATE SODIUM 100 MG PO CAPS
100.0000 mg | ORAL_CAPSULE | Freq: Two times a day (BID) | ORAL | Status: DC
  Administered 2019-02-27 – 2019-03-05 (×11): 100 mg via ORAL
  Filled 2019-02-27 (×12): qty 1

## 2019-02-27 MED ORDER — SODIUM CHLORIDE 0.9 % IV SOLN
INTRAVENOUS | Status: DC | PRN
  Administered 2019-02-27: 50 ug/min via INTRAVENOUS

## 2019-02-27 MED ORDER — ROCURONIUM BROMIDE 10 MG/ML (PF) SYRINGE
PREFILLED_SYRINGE | INTRAVENOUS | Status: AC
  Filled 2019-02-27: qty 10

## 2019-02-27 MED ORDER — ONDANSETRON HCL 4 MG PO TABS: 4.0000 mg | ORAL_TABLET | Freq: Four times a day (QID) | ORAL | Status: DC | PRN

## 2019-02-27 MED ORDER — ISOPROPYL ALCOHOL 70 % SOLN
Status: DC | PRN
  Administered 2019-02-27: 1 via TOPICAL

## 2019-02-27 MED ORDER — ISOPROPYL ALCOHOL 70 % SOLN
Status: AC
  Filled 2019-02-27: qty 480

## 2019-02-27 MED ORDER — MENTHOL 3 MG MT LOZG: 1.0000 | LOZENGE | OROMUCOSAL | Status: DC | PRN

## 2019-02-27 MED ORDER — ONDANSETRON HCL 4 MG/2ML IJ SOLN: 4.0000 mg | Freq: Once | INTRAMUSCULAR | Status: DC | PRN

## 2019-02-27 MED ORDER — DEXAMETHASONE SODIUM PHOSPHATE 10 MG/ML IJ SOLN
INTRAMUSCULAR | Status: AC
  Filled 2019-02-27: qty 1

## 2019-02-27 MED ORDER — METOCLOPRAMIDE HCL 5 MG/ML IJ SOLN: 5.0000 mg | Freq: Three times a day (TID) | INTRAMUSCULAR | Status: DC | PRN

## 2019-02-27 MED ORDER — ROCURONIUM BROMIDE 10 MG/ML (PF) SYRINGE
PREFILLED_SYRINGE | INTRAVENOUS | Status: DC | PRN
  Administered 2019-02-27: 40 mg via INTRAVENOUS

## 2019-02-27 MED ORDER — FENTANYL CITRATE (PF) 250 MCG/5ML IJ SOLN
INTRAMUSCULAR | Status: AC
  Filled 2019-02-27: qty 5

## 2019-02-27 MED ORDER — ENSURE PRE-SURGERY PO LIQD
296.0000 mL | Freq: Once | ORAL | Status: DC
  Filled 2019-02-27: qty 296

## 2019-02-27 MED ORDER — TRANEXAMIC ACID-NACL 1000-0.7 MG/100ML-% IV SOLN
1000.0000 mg | INTRAVENOUS | Status: AC
  Administered 2019-02-27: 1000 mg via INTRAVENOUS

## 2019-02-27 MED ORDER — SUGAMMADEX SODIUM 200 MG/2ML IV SOLN
INTRAVENOUS | Status: DC | PRN
  Administered 2019-02-27: 200 mg via INTRAVENOUS

## 2019-02-27 MED ORDER — POVIDONE-IODINE 10 % EX SWAB: 2.0000 "application " | Freq: Once | CUTANEOUS | Status: DC

## 2019-02-27 MED ORDER — ONDANSETRON HCL 4 MG/2ML IJ SOLN
INTRAMUSCULAR | Status: DC | PRN
  Administered 2019-02-27: 4 mg via INTRAVENOUS

## 2019-02-27 MED ORDER — CEFAZOLIN SODIUM-DEXTROSE 2-4 GM/100ML-% IV SOLN
2.0000 g | Freq: Four times a day (QID) | INTRAVENOUS | Status: AC
  Administered 2019-02-27 – 2019-02-28 (×2): 2 g via INTRAVENOUS
  Filled 2019-02-27 (×2): qty 100

## 2019-02-27 SURGICAL SUPPLY — 40 items
ADH SKN CLS APL DERMABOND .7 (GAUZE/BANDAGES/DRESSINGS) ×1
APL PRP STRL LF DISP 70% ISPRP (MISCELLANEOUS) ×1
BAG SPEC THK2 15X12 ZIP CLS (MISCELLANEOUS)
BAG ZIPLOCK 12X15 (MISCELLANEOUS) IMPLANT
BIT DRILL CANN LG 4.3MM (BIT) IMPLANT
CHLORAPREP W/TINT 26 (MISCELLANEOUS) ×3 IMPLANT
COVER PERINEAL POST (MISCELLANEOUS) ×3 IMPLANT
COVER SURGICAL LIGHT HANDLE (MISCELLANEOUS) ×3 IMPLANT
COVER WAND RF STERILE (DRAPES) IMPLANT
DERMABOND ADVANCED (GAUZE/BANDAGES/DRESSINGS) ×2
DERMABOND ADVANCED .7 DNX12 (GAUZE/BANDAGES/DRESSINGS) ×1 IMPLANT
DRAPE C-ARM 42X120 X-RAY (DRAPES) ×5 IMPLANT
DRAPE C-ARMOR (DRAPES) ×3 IMPLANT
DRAPE SHEET LG 3/4 BI-LAMINATE (DRAPES) ×3 IMPLANT
DRAPE STERI IOBAN 125X83 (DRAPES) ×3 IMPLANT
DRAPE U-SHAPE 47X51 STRL (DRAPES) ×6 IMPLANT
DRILL BIT CANN LG 4.3MM (BIT) ×3
DRSG MEPILEX BORDER 4X4 (GAUZE/BANDAGES/DRESSINGS) ×9 IMPLANT
ELECT BLADE TIP CTD 4 INCH (ELECTRODE) IMPLANT
FACESHIELD WRAPAROUND (MASK) ×6 IMPLANT
FACESHIELD WRAPAROUND OR TEAM (MASK) ×2 IMPLANT
GAUZE SPONGE 4X4 12PLY STRL (GAUZE/BANDAGES/DRESSINGS) ×3 IMPLANT
GLOVE BIO SURGEON STRL SZ8.5 (GLOVE) ×6 IMPLANT
GLOVE BIOGEL PI IND STRL 8.5 (GLOVE) ×1 IMPLANT
GLOVE BIOGEL PI INDICATOR 8.5 (GLOVE) ×10
GOWN SPEC L3 XXLG W/TWL (GOWN DISPOSABLE) ×7 IMPLANT
GUIDEPIN 3.2X17.5 THRD DISP (PIN) ×2 IMPLANT
HFN 125 DEG 11MM X 180MM (Orthopedic Implant) ×2 IMPLANT
KIT BASIN OR (CUSTOM PROCEDURE TRAY) ×3 IMPLANT
KIT TURNOVER KIT A (KITS) IMPLANT
MANIFOLD NEPTUNE II (INSTRUMENTS) ×3 IMPLANT
MARKER SKIN DUAL TIP RULER LAB (MISCELLANEOUS) ×3 IMPLANT
PACK TOTAL JOINT (CUSTOM PROCEDURE TRAY) ×3 IMPLANT
SCREW BONE CORTICAL 5.0X38 (Screw) ×2 IMPLANT
SCREW LAG 10.5MMX105MM HFN (Screw) ×2 IMPLANT
SUT MNCRL AB 3-0 PS2 18 (SUTURE) ×2 IMPLANT
SUT MON AB 2-0 CT1 36 (SUTURE) ×5 IMPLANT
TOWEL OR 17X26 10 PK STRL BLUE (TOWEL DISPOSABLE) ×3 IMPLANT
TOWEL OR NON WOVEN STRL DISP B (DISPOSABLE) ×3 IMPLANT
YANKAUER SUCT BULB TIP NO VENT (SUCTIONS) ×3 IMPLANT

## 2019-02-27 NOTE — Transfer of Care (Signed)
Immediate Anesthesia Transfer of Care Note  Patient: Jared [name]  Procedure(s) Performed: INTRAMEDULLARY (IM) NAIL FEMORAL (Right )  Patient Location: PACU  Anesthesia Type:General  Level of Consciousness: awake and drowsy  Airway & Oxygen Therapy: Patient Spontanous Breathing and Patient connected to face mask oxygen  Post-op Assessment: Report given to RN and Post -op Vital signs reviewed and stable  Post vital signs: Reviewed and stable  Last Vitals:  Vitals Value Taken Time  BP 148/106 02/27/2019  5:15 PM  Temp    Pulse 82 02/27/2019  5:16 PM  Resp 18 02/27/2019  5:19 PM  SpO2 100 % 02/27/2019  5:16 PM  Vitals shown include unvalidated device data.  Last Pain:  Vitals:   02/27/19 1000  TempSrc:   PainSc: 0-No pain         Complications: No apparent anesthesia complications

## 2019-02-27 NOTE — Interval H&P Note (Signed)
History and Physical Interval Note:  02/27/2019 2:13 PM  Jared [name]  has presented today for surgery, with the diagnosis of right intertrochanteric fracture.  The various methods of treatment have been discussed with the patient and family. After consideration of risks, benefits and other options for treatment, the patient has consented to  Procedure(s): INTRAMEDULLARY (IM) NAIL FEMORAL (Right) as a surgical intervention.  The patient's history has been reviewed, patient examined, no change in status, stable for surgery.  I have reviewed the patient's chart and labs.  Questions were answered to the patient's satisfaction.    The risks, benefits, and alternatives were discussed with the patient. There are risks associated with the surgery including, but not limited to, problems with anesthesia (death), infection, differences in leg length/angulation/rotation, fracture of bones, loosening or failure of implants, malunion, nonunion, hematoma (blood accumulation) which may require surgical drainage, blood clots, pulmonary embolism, nerve injury (foot drop), and blood vessel injury. The patient understands these risks and elects to proceed.    Hilton Cork Darene Nappi

## 2019-02-27 NOTE — Progress Notes (Signed)
PROGRESS NOTE    Jared [name]  VZD:[id number] DOB: Nov 20, 1940 DOA: 02/26/2019 PCP: [name], MD   Brief Narrative: Patient is a 78 year old male with history of hypertension, stroke, gout, depression, anxiety, OSA not on CPAP, paroxysmal A. fib, BPH who presents from the skilled nursing facility with fall and developed right hip pain.  He was just discharged on 6/8 after left knee replacement on 6/5.  He was discharged to rehab facility but he fell there. X-ray imaging showed acute displaced intertrochanteric fracture of the proximal right femur.  Orthopedics consulted.  Plan for ORIF today.  Assessment & Plan:   Principal Problem:   Closed displaced intertrochanteric fracture of right femur ([place]) Active Problems:   Essential hypertension   GERD   BPH (benign prostatic hyperplasia)   Atrial fibrillation (HCC)   MDD (major depressive disorder), recurrent, in partial remission ([place])   Benzodiazepine dependence ([place])   Status post total knee replacement, left   Fall   Stroke [facility])   Femur fracture, right ([place])  Closed displaced intertrochanteric fracture right femur: Following a mechanical fall .Othopedics following.  Plan for ORIF.  PT/OTfollowing.  He will likely  go back to the same skilled facility where he came from when he is stable for discharge. Continue pain management.  Pain looks well controlled this morning  Status post total knee replacement of the left : Orthopedics following.  Continue pain management.  Has a brace on place.  Paroxysmal A. IRJ:[id number] Score is 5.  On Xarelto at home.  Heart rate is well controlled.  Continue Coreg.  Xarelto held for surgery, resume after surgery.  Hypertension: Continue current medications.  Currently blood pressure stable  GERD: Continue Protonix  BPH: On dutasteride  Depressive disorder: On remeron, bupropion, Xanax.           DVT prophylaxis:SCD Code Status: Full Family Communication: None present at the  bedside Disposition Plan: Skilled nursing facility after surgery, PT evaluation   Consultants: Orthopedics  Procedures: Plan for ORIF  Antimicrobials:  Anti-infectives (From admission, onward)   None      Subjective: Patient seen and examined the bedside this morning.  Comfortable.  Hemodynamically stable.  Pain looks well controlled.  Waiting for surgery.  No active issues.  Objective: Vitals:   02/27/19 0124 02/27/19 0422 02/27/19 0700 02/27/19 1000  BP: 139/66 (!) 157/73  (!) 153/81  Pulse: (!) 55 63  63  Resp: 16 16    Temp: 98.9 F (37.2 C) 99.2 F (37.3 C)    TempSrc: Oral Oral    SpO2: 94% 93%    Weight:   92.6 kg   Height:   5\' 10"  (1.778 m)     Intake/Output Summary (Last 24 hours) at 02/27/2019 1239 Last data filed at 02/27/2019 1019 Gross per 24 hour  Intake 759.2 ml  Output 1225 ml  Net -465.8 ml   Filed Weights   02/27/19 0700  Weight: 92.6 kg    Examination:  General exam: Appears calm and comfortable ,Not in distress,average built HEENT:PERRL,Oral mucosa moist, Ear/Nose normal on gross exam Respiratory system: Bilateral equal air entry, normal vesicular breath sounds, no wheezes or crackles  Cardiovascular system: Afib. No JVD, murmurs, rubs, gallops or clicks. No pedal edema. Gastrointestinal system: Abdomen is nondistended, soft and nontender. No organomegaly or masses felt. Normal bowel sounds heard. Central nervous system: Alert and oriented. No focal neurological deficits. Extremities: No edema, no clubbing ,no cyanosis, distal peripheral pulses palpable. Traction on the right lower  extremity, brace on the left knee Skin: No rashes, lesions or ulcers,no icterus ,no pallor MSK: Normal muscle bulk,tone ,power Psychiatry: Judgement and insight appear normal. Mood & affect appropriate.     Data Reviewed: I have personally reviewed following labs and imaging studies  CBC: Recent Labs  Lab 02/23/19 0355 02/24/19 0337 02/26/19 0040  02/26/19 0441  WBC 12.9* 13.0* 10.6* 11.2*  NEUTROABS  --   --  7.6  --   HGB 12.9* 13.3 12.3* 11.6*  HCT 37.8* 40.2 36.3* 33.4*  MCV 92.4 94.1 91.2 89.8  PLT 193 202 201 662   Basic Metabolic Panel: Recent Labs  Lab 02/23/19 0355 02/26/19 0040 02/26/19 0441  NA 138 137 139  K 4.1 3.8 4.2  CL 107 104 105  CO2 22 23 26   GLUCOSE 166* 149* 143*  BUN 19 25* 24*  CREATININE 1.02 1.02 1.06  CALCIUM 8.7* 8.8* 8.8*   GFR: Estimated Creatinine Clearance: 67.8 mL/min (by C-G formula based on SCr of 1.06 mg/dL). Liver Function Tests: No results for input(s): AST, ALT, ALKPHOS, BILITOT, PROT, ALBUMIN in the last 168 hours. No results for input(s): LIPASE, AMYLASE in the last 168 hours. No results for input(s): AMMONIA in the last 168 hours. Coagulation Profile: Recent Labs  Lab 02/26/19 0441  INR 2.0*   Cardiac Enzymes: No results for input(s): CKTOTAL, CKMB, CKMBINDEX, TROPONINI in the last 168 hours. BNP (last 3 results) No results for input(s): PROBNP in the last 8760 hours. HbA1C: No results for input(s): HGBA1C in the last 72 hours. CBG: No results for input(s): GLUCAP in the last 168 hours. Lipid Profile: No results for input(s): CHOL, HDL, LDLCALC, TRIG, CHOLHDL, LDLDIRECT in the last 72 hours. Thyroid Function Tests: No results for input(s): TSH, T4TOTAL, FREET4, T3FREE, THYROIDAB in the last 72 hours. Anemia Panel: No results for input(s): VITAMINB12, FOLATE, FERRITIN, TIBC, IRON, RETICCTPCT in the last 72 hours. Sepsis Labs: No results for input(s): PROCALCITON, LATICACIDVEN in the last 168 hours.  Recent Results (from the past 240 hour(s))  Novel Coronavirus, NAA (hospital order; send-out to ref lab)     Status: None   Collection Time: 02/19/19  9:32 AM  Result Value Ref Range Status   SARS-CoV-2, NAA NOT DETECTED NOT DETECTED Final    Comment: (NOTE) This test was developed and its performance characteristics determined by [employer]. This test  has not been FDA cleared or approved. This test has been authorized by FDA under an Emergency Use Authorization (EUA). This test is only authorized for the duration of time the declaration that circumstances exist justifying the authorization of the emergency use of in vitro diagnostic tests for detection of SARS-CoV-2 virus and/or diagnosis of COVID-19 infection under section 564(b)(1) of the Act, 21 U.S.C. 947MLY-6(T)(0), unless the authorization is terminated or revoked sooner. When diagnostic testing is negative, the possibility of a false negative result should be considered in the context of a patient's recent exposures and the presence of clinical signs and symptoms consistent with COVID-19. An individual without symptoms of COVID-19 and who is not shedding SARS-CoV-2 virus would expect to have a negative (not detected) result in this assay. Performed  At: [facility] [address] [place], [place] [id number] Rush Farmer MD XN:[id number]    [place]  Final    Comment: Performed at [facility], [place] [address], [place], [place] [address]  SARS Coronavirus 2 (CEPHEID - Performed in [facility] hospital lab), [facility]  Status: None   Collection Time: 02/26/19  1:58 AM  Result Value Ref Range Status   SARS Coronavirus 2 NEGATIVE NEGATIVE Final    Comment: (NOTE) If result is NEGATIVE SARS-CoV-2 target nucleic acids are NOT DETECTED. The SARS-CoV-2 RNA is generally detectable in upper and lower  respiratory specimens during the acute phase of infection. The lowest  concentration of SARS-CoV-2 viral copies this assay can detect is 250  copies / mL. A negative result does not preclude SARS-CoV-2 infection  and should not be used as the sole basis for treatment or other  patient management decisions.  A negative result may occur with  improper specimen collection / handling, submission of specimen other  than nasopharyngeal swab,  presence of viral mutation(s) within the  areas targeted by this assay, and inadequate number of viral copies  (<250 copies / mL). A negative result must be combined with clinical  observations, patient history, and epidemiological information. If result is POSITIVE SARS-CoV-2 target nucleic acids are DETECTED. The SARS-CoV-2 RNA is generally detectable in upper and lower  respiratory specimens dur ing the acute phase of infection.  Positive  results are indicative of active infection with SARS-CoV-2.  Clinical  correlation with patient history and other diagnostic information is  necessary to determine patient infection status.  Positive results do  not rule out bacterial infection or co-infection with other viruses. If result is PRESUMPTIVE POSTIVE SARS-CoV-2 nucleic acids MAY BE PRESENT.   A presumptive positive result was obtained on the submitted specimen  and confirmed on repeat testing.  While 2019 novel coronavirus  (SARS-CoV-2) nucleic acids may be present in the submitted sample  additional confirmatory testing may be necessary for epidemiological  and / or clinical management purposes  to differentiate between  SARS-CoV-2 and other Sarbecovirus currently known to infect humans.  If clinically indicated additional testing with an alternate test  methodology [phone]) is advised. The SARS-CoV-2 RNA is generally  detectable in upper and lower respiratory sp ecimens during the acute  phase of infection. The expected result is Negative. Fact Sheet for Patients:  StrictlyIdeas.no Fact Sheet for Healthcare Providers: BankingDealers.co.za This test is not yet approved or cleared by the [place] FDA and has been authorized for detection and/or diagnosis of SARS-CoV-2 by FDA under an Emergency Use Authorization (EUA).  This EUA will remain in effect (meaning this test can be used) for the duration of the COVID-19 declaration under  Section 564(b)(1) of the Act, 21 U.S.C. section 360bbb-3(b)(1), unless the authorization is terminated or revoked sooner. Performed at [facility], [place] [address], [place], [place] [address]          Radiology Studies: Dg Chest 1 View  Result Date: 02/26/2019 CLINICAL DATA:  Pain status post fall. EXAM: CHEST  1 VIEW COMPARISON:  None. FINDINGS: The heart size is enlarged. The patient is status post placement of a leadless pacemaker. There is no pneumothorax. No large pleural effusion. The lungs are essentially clear. There is no evidence of an acute osseous abnormality. Aortic calcifications are noted. Pleuroparenchymal scarring is noted at the lung apices. IMPRESSION: No active disease. Electronically Signed   By: Constance Holster M.D.   On: 02/26/2019 01:39   Ct Head Wo Contrast  Result Date: 02/26/2019 CLINICAL DATA:  Head trauma. EXAM: CT HEAD WITHOUT CONTRAST CT CERVICAL SPINE WITHOUT CONTRAST TECHNIQUE: Multidetector CT imaging of the head and cervical spine was performed following the standard protocol without intravenous contrast. Multiplanar CT image reconstructions of  the cervical spine were also generated. COMPARISON:  None. FINDINGS: CT HEAD FINDINGS Brain: No evidence of acute infarction, hemorrhage, hydrocephalus, extra-axial collection or mass lesion/mass effect. There is symmetric volume loss which is greater than expected for the patient's age. There are chronic microvascular ischemic changes bilaterally. Vascular: No hyperdense vessel or unexpected calcification. Skull: Normal. Negative for fracture or focal lesion. Sinuses/Orbits: There is maxillary mucosal thickening bilaterally. There is ethmoid air cell mucosal thickening bilaterally. The remaining paranasal sinuses and mastoid air cells are essentially clear. The patient is status post bilateral cataract surgery. Other: None. CT CERVICAL SPINE FINDINGS Alignment: Normal. Skull base and vertebrae: No acute fracture.  No primary bone lesion or focal pathologic process. Soft tissues and spinal canal: No prevertebral fluid or swelling. No visible canal hematoma. Disc levels: There is mild multilevel disc height loss noted throughout the cervical spine greatest at the C6-C7 and C7-T1 levels. Upper chest: Negative. Other: None IMPRESSION: 1. No acute intracranial abnormality detected. 2. No cervical spine fracture. 3. Moderate volume loss, greater than expected for the patient's age. There are chronic microvascular ischemic changes. Electronically Signed   By: Constance Holster M.D.   On: 02/26/2019 01:33   Ct Cervical Spine Wo Contrast  Result Date: 02/26/2019 CLINICAL DATA:  Head trauma. EXAM: CT HEAD WITHOUT CONTRAST CT CERVICAL SPINE WITHOUT CONTRAST TECHNIQUE: Multidetector CT imaging of the head and cervical spine was performed following the standard protocol without intravenous contrast. Multiplanar CT image reconstructions of the cervical spine were also generated. COMPARISON:  None. FINDINGS: CT HEAD FINDINGS Brain: No evidence of acute infarction, hemorrhage, hydrocephalus, extra-axial collection or mass lesion/mass effect. There is symmetric volume loss which is greater than expected for the patient's age. There are chronic microvascular ischemic changes bilaterally. Vascular: No hyperdense vessel or unexpected calcification. Skull: Normal. Negative for fracture or focal lesion. Sinuses/Orbits: There is maxillary mucosal thickening bilaterally. There is ethmoid air cell mucosal thickening bilaterally. The remaining paranasal sinuses and mastoid air cells are essentially clear. The patient is status post bilateral cataract surgery. Other: None. CT CERVICAL SPINE FINDINGS Alignment: Normal. Skull base and vertebrae: No acute fracture. No primary bone lesion or focal pathologic process. Soft tissues and spinal canal: No prevertebral fluid or swelling. No visible canal hematoma. Disc levels: There is mild multilevel disc  height loss noted throughout the cervical spine greatest at the C6-C7 and C7-T1 levels. Upper chest: Negative. Other: None IMPRESSION: 1. No acute intracranial abnormality detected. 2. No cervical spine fracture. 3. Moderate volume loss, greater than expected for the patient's age. There are chronic microvascular ischemic changes. Electronically Signed   By: Constance Holster M.D.   On: 02/26/2019 01:33   Dg Knee Complete 4 Views Left  Result Date: 02/26/2019 CLINICAL DATA:  Pain status post fall EXAM: LEFT KNEE - COMPLETE 4+ VIEW COMPARISON:  None. FINDINGS: The patient is status post remote total knee arthroplasty on the left. The hardware appears intact. The alignment appears normal. There is no evidence of a periprosthetic fracture. There appears to be a moderate to large joint effusion. IMPRESSION: 1. No acute displaced fracture or dislocation. 2. Postsurgical changes related to recent left total knee arthroplasty without evidence of a periprosthetic fracture. A large joint effusion is noted which is likely related to the recent surgical intervention. Electronically Signed   By: Constance Holster M.D.   On: 02/26/2019 01:43   Dg Hip Unilat W Or Wo Pelvis 2-3 Views Right  Result Date: 02/26/2019 CLINICAL DATA:  Pain EXAM:  DG HIP (WITH OR WITHOUT PELVIS) 2-3V RIGHT COMPARISON:  None. FINDINGS: There is an acute displaced intratrochanteric fracture of the proximal right fume ir. There is a large medially displaced fracture fragment. There is no dislocation. There is osteopenia. There are mild degenerative changes of both femoroacetabular joints. IMPRESSION: Acute comminuted displaced intratrochanteric fracture of the proximal right femur. Electronically Signed   By: Constance Holster M.D.   On: 02/26/2019 01:40   Dg Femur Min 2 Views Right  Result Date: 02/26/2019 CLINICAL DATA:  Fracture EXAM: RIGHT FEMUR 2 VIEWS COMPARISON:  None. FINDINGS: There is an acute comminuted displaced intratrochanteric  fracture of the proximal right femur. There is no dislocation. There are degenerative changes of the right knee. There is no distal femur fracture. IMPRESSION: Acute displaced intratrochanteric fracture of the proximal right femur. Electronically Signed   By: Constance Holster M.D.   On: 02/26/2019 01:44        Scheduled Meds:  amLODipine  10 mg Oral Daily   buPROPion  150 mg Oral BID   carvedilol  3.125 mg Oral BID WC   cycloSPORINE  1 drop Both Eyes BID   dutasteride  0.5 mg Oral Daily   mirtazapine  15 mg Oral QHS   multivitamin with minerals  1 tablet Oral Daily   pantoprazole  40 mg Oral Daily   timolol  1 drop Both Eyes BID   Continuous Infusions:   LOS: 1 day    Time spent: 35 mins.More than 50% of that time was spent in counseling and/or coordination of care.      Shelly Coss, MD [facility] Pager [phone]  If 7PM-7AM, please contact night-coverage [email or web address] Password [facility] 02/27/2019, 12:39 PM

## 2019-02-27 NOTE — Plan of Care (Signed)

## 2019-02-27 NOTE — Anesthesia Postprocedure Evaluation (Signed)
Anesthesia Post Note  Patient: Jared [name]  Procedure(s) Performed: INTRAMEDULLARY (IM) NAIL FEMORAL (Right )     Patient location during evaluation: PACU Anesthesia Type: General Level of consciousness: awake and alert Pain management: pain level controlled Vital Signs Assessment: post-procedure vital signs reviewed and stable Respiratory status: spontaneous breathing, nonlabored ventilation, respiratory function stable and patient connected to nasal cannula oxygen Cardiovascular status: blood pressure returned to baseline and stable Postop Assessment: no apparent nausea or vomiting Anesthetic complications: no    Last Vitals:  Vitals:   02/27/19 1730 02/27/19 1745  BP: (!) 135/115 (!) 158/92  Pulse: 87 (!) 58  Resp: 20 (!) 22  Temp:    SpO2: 97% 93%    Last Pain:  Vitals:   02/27/19 1745  TempSrc:   PainSc: 0-No pain    LLE Motor Response: Purposeful movement (02/27/19 1745) LLE Sensation: Full sensation (02/27/19 1745) RLE Motor Response: Purposeful movement (02/27/19 1745) RLE Sensation: Full sensation (02/27/19 1745)      Leinaala Catanese S

## 2019-02-27 NOTE — Anesthesia Preprocedure Evaluation (Signed)
Anesthesia Evaluation    Reviewed: Allergy & Precautions, Patient's Chart, lab work & pertinent test results  History of Anesthesia Complications Negative for: history of anesthetic complications  Airway Mallampati: II  TM Distance: >3 FB Neck ROM: Full    Dental no notable dental hx. (+) Teeth Intact   Pulmonary sleep apnea ,    Pulmonary exam normal        Cardiovascular hypertension, +CHF  + dysrhythmias Atrial Fibrillation  Rhythm:Irregular Rate:Normal     Neuro/Psych PSYCHIATRIC DISORDERS Anxiety Depression CVA, No Residual Symptoms    GI/Hepatic Neg liver ROS, GERD  ,  Endo/Other  negative endocrine ROS  Renal/GU negative Renal ROS  negative genitourinary   Musculoskeletal  (+) Arthritis ,   Abdominal   Peds  Hematology negative hematology ROS (+)   Anesthesia Other Findings Last dose Xarelto on 6/8 @ 1734 No interval change in health since TKA on 6/5. Had mechanical fall- no preceding palpitations, dizziness or LOC   Reproductive/Obstetrics                            Anesthesia Physical Anesthesia Plan  ASA: III  Anesthesia Plan: General   Post-op Pain Management:    Induction: Intravenous  PONV Risk Score and Plan: 2 and Ondansetron, Dexamethasone and Treatment may vary due to age or medical condition  Airway Management Planned: Oral ETT  Additional Equipment: None  Intra-op Plan:   Post-operative Plan: Extubation in OR  Informed Consent: I have reviewed the patients History and Physical, chart, labs and discussed the procedure including the risks, benefits and alternatives for the proposed anesthesia with the patient or authorized representative who has indicated his/her understanding and acceptance.     Dental advisory given  Plan Discussed with:   Anesthesia Plan Comments:         Anesthesia Quick Evaluation

## 2019-02-27 NOTE — Op Note (Signed)
OPERATIVE REPORT  SURGEON: Rod Can, MD   ASSISTANT: Staff.  PREOPERATIVE DIAGNOSIS: Right intertrochanteric femur fracture.   POSTOPERATIVE DIAGNOSIS: Right intertrochanteric femur fracture.   PROCEDURE: Intramedullary fixation, Right femur.   IMPLANTS: Biomet Affixus Hip Fracture Nail, 11 by 180 mm, 125 degrees. 10.5 x 105 mm Hip Fracture Nail Lag Screw. 5 x 38 mm distal interlocking screw 1.  ANESTHESIA:  General  ESTIMATED BLOOD LOSS:-50 mL    ANTIBIOTICS: 2 g Ancef.  DRAINS: None.  COMPLICATIONS: None.   CONDITION: PACU - hemodynamically stable.Marland Kitchen   BRIEF CLINICAL NOTE: Jared [name] is a 78 y.o. male who presented with an intertrochanteric femur fracture. He underwent left total knee arthroplasty by Dr. Veverly Fells about a week ago. The patient was admitted to the hospitalist service and underwent perioperative risk stratification and medical optimization. The risks, benefits, and alternatives to the procedure were explained, and the patient elected to proceed.  PROCEDURE IN DETAIL: Surgical site was marked by myself. The patient was taken to the operating room and anesthesia was induced on the bed. The patient was then transferred to the [facility] table and the nonoperative lower extremity was scissored underneath the operative side. The fracture was reduced with traction, internal rotation, and adduction. The hip was prepped and draped in the normal sterile surgical fashion. Timeout was called verifying side and site of surgery. Preop antibiotics were given with 60 minutes of beginning the procedure.  Fluoroscopy was used to define the patient's anatomy. A 4 cm incision was made just proximal to the tip of the greater trochanter. The awl was used to obtain the standard starting point for a trochanteric entry nail under fluoroscopic control. The guidepin was placed. The entry reamer was used to open the proximal femur.  On the back table, the nail was assembled onto the jig. The  nail was placed into the femur without any difficulty. Through a separate stab incision, the cannula was placed down to the bone in preparation for the cephalomedullary device. A guidepin was placed into the femoral head using AP and lateral fluoroscopy views. The pin was measured, and then reaming was performed to the appropriate depth. The lag screw was inserted to the appropriate depth. The fracture was compressed through the jig. The setscrew was tightened and then loosened one quarter turn. A separate stab incision was created, and the distal interlocking screw was placed using standard AO technique. The jig was removed. Final AP and lateral fluoroscopy views were obtained to confirm fracture reduction and hardware placement. Tip apex distance was appropriate. There was no chondral penetration.  The wounds were copiously irrigated with saline. The wound was closed in layers with #1 Vicryl for the fascia, 2-0 Monocryl for the deep dermal layer, and 3-0 Monocryl subcuticular stitch. Glue was applied to the skin. Once the glue was fully hardened, sterile dressing was applied. The patient was then awakened from anesthesia and taken to the PACU in stable condition. Sponge needle and instrument counts were correct at the end of the case 2. There were no known complications.  We will readmit the patient to the hospitalist. Weightbearing status will be weightbearing as tolerated with a walker. We will begin Lovenox for DVT prophylaxis. The patient will work with physical therapy and undergo disposition planning.

## 2019-02-27 NOTE — Anesthesia Procedure Notes (Signed)
Procedure Name: Intubation Date/Time: 02/27/2019 3:34 PM Performed by: Mitzie Na, CRNA Pre-anesthesia Checklist: Patient identified, Emergency Drugs available, Suction available, Patient being monitored and Timeout performed Patient Re-evaluated:Patient Re-evaluated prior to induction Oxygen Delivery Method: Circle system utilized Preoxygenation: Pre-oxygenation with 100% oxygen Induction Type: IV induction and Rapid sequence Laryngoscope Size: Mac and 4 Grade View: Grade I Tube type: Oral Tube size: 7.5 mm Number of attempts: 1 Airway Equipment and Method: Stylet Placement Confirmation: ETT inserted through vocal cords under direct vision,  positive ETCO2 and breath sounds checked- equal and bilateral Secured at: 24 cm Tube secured with: Tape Dental Injury: Teeth and Oropharynx as per pre-operative assessment

## 2019-02-28 ENCOUNTER — Encounter (HOSPITAL_COMMUNITY): Payer: Self-pay | Admitting: Orthopedic Surgery

## 2019-02-28 DIAGNOSIS — S72141D Displaced intertrochanteric fracture of right femur, subsequent encounter for closed fracture with routine healing: Secondary | ICD-10-CM

## 2019-02-28 LAB — BASIC METABOLIC PANEL
Anion gap: 8 (ref 5–15)
BUN: 31 mg/dL — ABNORMAL HIGH (ref 8–23)
CO2: 24 mmol/L (ref 22–32)
Calcium: 8.7 mg/dL — ABNORMAL LOW (ref 8.9–10.3)
Chloride: 108 mmol/L (ref 98–111)
Creatinine, Ser: 0.86 mg/dL (ref 0.61–1.24)
GFR calc Af Amer: >60 mL/min (ref >60)
GFR calc non Af Amer: >60 mL/min (ref >60)
Glucose, Bld: 162 mg/dL — ABNORMAL HIGH (ref 70–99)
Potassium: 4.1 mmol/L (ref 3.5–5.1)
Sodium: 140 mmol/L (ref 135–145)

## 2019-02-28 LAB — CBC
HCT: 29.6 % — ABNORMAL LOW (ref 39.0–52.0)
Hemoglobin: 9.6 g/dL — ABNORMAL LOW (ref 13.0–17.0)
MCH: 30.6 pg (ref 26.0–34.0)
MCHC: 32.4 g/dL (ref 30.0–36.0)
MCV: 94.3 fL (ref 80.0–100.0)
Platelets: 202 10*3/uL (ref 150–400)
RBC: 3.14 MIL/uL — ABNORMAL LOW (ref 4.22–5.81)
RDW: 12.3 % (ref 11.5–15.5)
WBC: 10.2 10*3/uL (ref 4.0–10.5)
nRBC: 0 % (ref 0.0–0.2)

## 2019-02-28 LAB — ABO/RH: ABO/RH(D): O POS

## 2019-02-28 MED ORDER — LACTULOSE 10 GM/15ML PO SOLN
20.0000 g | Freq: Three times a day (TID) | ORAL | Status: DC
  Administered 2019-02-28 (×2): 20 g via ORAL
  Administered 2019-02-28: 10 g via ORAL
  Administered 2019-03-01 – 2019-03-05 (×3): 20 g via ORAL
  Filled 2019-02-28 (×8): qty 30

## 2019-02-28 MED ORDER — OXYCODONE-ACETAMINOPHEN 5-325 MG PO TABS: 1.0000 | ORAL_TABLET | ORAL | 0 refills | Status: DC | PRN

## 2019-02-28 MED ORDER — BISACODYL 10 MG RE SUPP
10.0000 mg | Freq: Every day | RECTAL | Status: DC | PRN
  Administered 2019-03-01: 10 mg via RECTAL
  Filled 2019-02-28: qty 1

## 2019-02-28 MED ORDER — RIVAROXABAN 10 MG PO TABS
10.0000 mg | ORAL_TABLET | Freq: Every day | ORAL | Status: DC
  Administered 2019-03-01: 10 mg via ORAL
  Filled 2019-02-28: qty 1

## 2019-02-28 NOTE — Progress Notes (Addendum)
    Subjective:  Patient reports pain as mild to moderate.  Denies N/V/CP/SOB. C/o R hip and L knee pain.  Objective:   VITALS:   Vitals:   02/27/19 2135 02/27/19 2230 02/28/19 0636 02/28/19 0954  BP: 131/74 (!) 144/75 (!) 169/76 (!) 148/63  Pulse: 84 86 66 65  Resp: 20 (!) 22 18 18   Temp: 99 F (37.2 C) 98.5 F (36.9 C) 98.4 F (36.9 C) 98.3 F (36.8 C)  TempSrc: Oral Oral  Oral  SpO2: 97% 97% 98% 97%  Weight:      Height:        NAD ABD soft Sensation intact distally Intact pulses distally Dorsiflexion/Plantar flexion intact Incision: dressing C/D/I Compartment soft   Lab Results  Component Value Date   WBC 10.2 02/28/2019   HGB 9.6 (L) 02/28/2019   HCT 29.6 (L) 02/28/2019   MCV 94.3 02/28/2019   PLT 202 02/28/2019   BMET    Component Value Date/Time   NA 140 02/28/2019 0507   K 4.1 02/28/2019 0507   CL 108 02/28/2019 0507   CO2 24 02/28/2019 0507   GLUCOSE 162 (H) 02/28/2019 0507   BUN 31 (H) 02/28/2019 0507   CREATININE 0.86 02/28/2019 0507   CALCIUM 8.7 (L) 02/28/2019 0507   GFRNONAA >60 02/28/2019 0507   GFRAA >60 02/28/2019 0507     Assessment/Plan: 1 Day Post-Op   Principal Problem:   Closed displaced intertrochanteric fracture of right femur (HCC) Active Problems:   Essential hypertension   GERD   BPH (benign prostatic hyperplasia)   Atrial fibrillation (HCC)   MDD (major depressive disorder), recurrent, in partial remission (HCC)   Benzodiazepine dependence (HCC)   Status post total knee replacement, left   Fall   Stroke [facility])   Femur fracture, right ([place])   Closed comminuted intertrochanteric fracture of proximal end of right femur ([place])   WBAT with walker DVT ppx: Xarelto - can resume home dose tomorrow, SCDs, TEDS PO pain control PT/OT Dispo: consult [facility] IP rehab   Jared [name] 02/28/2019, 10:33 AM   Rod Can, MD Cell: [phone] [facility]  [place] [address], [address], [place], [place] [address] Phone: [phone] [email or web address] Facebook  [employer]

## 2019-02-28 NOTE — Progress Notes (Signed)
Orthopedics Progress Note  Subjective: Patient reports feeling some better this PM but still no BM.  Objective:  Vitals:   02/28/19 0954 02/28/19 1308  BP: (!) 148/63 136/69  Pulse: 65 75  Resp: 18 16  Temp: 98.3 F (36.8 C) 98.8 F (37.1 C)  SpO2: 97% 92%    General: Awake and alert  Musculoskeletal: Left total knee incision CDI, Aquacel in place, no cords. Pain with flexion past 90 degrees and some guarding.  Neurovascularly intact  Lab Results  Component Value Date   WBC 10.2 02/28/2019   HGB 9.6 (L) 02/28/2019   HCT 29.6 (L) 02/28/2019   MCV 94.3 02/28/2019   PLT 202 02/28/2019       Component Value Date/Time   NA 140 02/28/2019 0507   K 4.1 02/28/2019 0507   CL 108 02/28/2019 0507   CO2 24 02/28/2019 0507   GLUCOSE 162 (H) 02/28/2019 0507   BUN 31 (H) 02/28/2019 0507   CREATININE 0.86 02/28/2019 0507   CALCIUM 8.7 (L) 02/28/2019 0507   GFRNONAA >60 02/28/2019 0507   GFRAA >60 02/28/2019 0507    Lab Results  Component Value Date   INR 2.0 (H) 02/26/2019   INR 1.03 07/26/2017    Assessment/Plan: POD #6 s/p Procedure(s): Left total knee replacement, POD #2 S/P INTRAMEDULLARY (IM) NAIL FEMORAL RIGHT Continue PT, OT, mobilization  CIR did not feel that the patient would be a good candidate for inpatient rehab  I am personally concerned about the patient and his fall risk returning to SNF.  No BM - suppository if no result with lactulose  Doran Heater. Veverly Fells, MD 02/28/2019 5:17 PM

## 2019-02-28 NOTE — Progress Notes (Signed)
Inpatient Rehabilitation-Admissions Coordinator   Spoke with pt via phone to discuss CIR program. Pt is familiar with program as he says he has been here before. Both pt and AC feel that he would not be able to reach a Mod I level after a short CIR stay and that he will most likely require a longer, slower-paced program to allow him to reach his full potential as he has no caregiver assist available at DC. Pt is in agreement for return to SNF when medically ready. AC will sign off.   Please call if questions.   Jhonnie Garner, OTR/L  Rehab Admissions Coordinator  [phone] 02/28/2019 2:50 PM

## 2019-02-28 NOTE — Progress Notes (Signed)
PROGRESS NOTE  Jared [name]:[id number] DOB: September 19, 1941 DOA: 02/26/2019 PCP: [name], MD  Brief History    Pt was recently hospitalized from 6/5-6/8 for left knee replacement on 6/5. He was just discharged to rehab facility yesterday. He states that he fell accidentally when he tried to turn the light on in facility, injured his right hip, causing severe pain.  The pain is constant, 10 out of 10 severity, sharp, nonradiating. He denies loss of consciousness.  No head or neck injury.  Patient denies chest pain, shortness breath, cough, fever or chills.  No nausea vomiting, diarrhea, abdominal pain, symptoms of UTI or unilateral weakness. He states that he started taking Xarelto yesterday, but does not remember at what time he took it.  ED Course: pt was found to have WBC 10.6, negative COVID-19 test, electrolytes renal function okay, bradycardia with heart rate 41-45, no tachypnea, oxygen saturation 93% on room air, temperature 99.7.  Chest x-ray negative.  X-ray of left knee showed postsurgical change without acute issues.  CT head is negative for acute intracranial abnormalities.  CT of C-spine is negative for acute bony fracture. Patient is admitted to telemetry bed as inpatient. Dr. Veverly Fells of ortho was consulted.   # X-ray of her right femur showed: Acute displaced intratrochanteric fracture of the proximal right femur.  Orthopedic surgery was consulted. The patient went for IM Nailing of right proximal femur on 02/27/2019.  Consultants  . Orthopedic surgery  Procedures  . IM Nailing of right proximal femur on 02/27/2019.  Antibiotics  . Peri-operative Ancef  Subjective  Today the patient is resting in bed. He is complaining of spasms in his right thigh. He is anxious to begin PT/rehab. No new complaints.  Objective   Vitals:  Vitals:   02/28/19 0954 02/28/19 1308  BP: (!) 148/63 136/69  Pulse: 65 75  Resp: 18 16  Temp: 98.3 F (36.8 C) 98.8 F (37.1 C)  SpO2: 97%  92%    Exam:  Constitutional:  . Appears somewhat uncomfortable with sporadic spasming of right thigh. Mild distress. He is awake, alert, and oriented x 3. Respiratory:  . No increased work of breathing.  . No wheezes, rales, or rhonchi . No tactile fremitus Cardiovascular:  . Regular rate and rhythm. . No murmurs, ectopy, or gallups . No LE extremity edema   . Normal pedal pulses Abdomen:  . Abdomen is soft, non-tender, non-distended . No hernias, masses, or organomegaly is appreciated . Normoactive bowel sounds.  Musculoskeletal:  . Digits/nails BUE: no clubbing, cyanosis, petechiae, infection Skin:  . No rashes, lesions, ulcers . palpation of skin: no induration or nodules Neurologic:  . CN 2-12 intact . Sensation all 4 extremities intact Psychiatric:  . Mental status o Mood, affect appropriate o Orientation to person, place, time  . judgment and insight appear intact   I have personally reviewed the following:   Today's Data  . CBC, BMP, Glucose, Vitals  Scheduled Meds: . amLODipine  10 mg Oral Daily  . buPROPion  150 mg Oral BID  . carvedilol  3.125 mg Oral BID WC  . cycloSPORINE  1 drop Both Eyes BID  . docusate sodium  100 mg Oral BID  . dutasteride  0.5 mg Oral Daily  . lactulose  20 g Oral TID  . mirtazapine  15 mg Oral QHS  . multivitamin with minerals  1 tablet Oral Daily  . pantoprazole  40 mg Oral Daily  . [START ON 03/01/2019] rivaroxaban  10 mg Oral Daily  . timolol  1 drop Both Eyes BID   Continuous Infusions:  Principal Problem:   Closed displaced intertrochanteric fracture of right femur (HCC) Active Problems:   Essential hypertension   GERD   BPH (benign prostatic hyperplasia)   Atrial fibrillation (HCC)   MDD (major depressive disorder), recurrent, in partial remission ([place])   Benzodiazepine dependence ([place])   Status post total knee replacement, left   Fall   Stroke [facility])   Femur fracture, right ([place])   Closed comminuted  intertrochanteric fracture of proximal end of right femur ([place])   LOS: 2 days    A & P   Closed displaced intertrochanteric fracture right femur: Following a mechanical fall .Othopedics following.  Pt underwent IM nailing of Right proximal femur on 02/27/2019. He has done well, except for spasms in the right thigh.  PT/OTfollowing. He will likely  go back to the same skilled facility where he came from when he is stable for discharge. Continue pain management. Consider the addition of flexeril as a prn for spasms.  Status post total knee replacement of the left: Orthopedics following. Continue pain management. Has a brace on place. Ancipate need for rehab on discharge.  Paroxysmal A. Fib: CHA2DS2-VASc Scoreis 5.  On Xarelto at home.  Heart rate is well controlled.  Continue Coreg.  Xarelto held for surgery, resume after surgery as per orthopedic surgery.  Hypertension: Continue current medications.  Currently blood pressure stable, but was a little high early in the am. Will add pm lisinopril.  GERD: Continue Protonix  BPH: On dutasteride  Depressive disorder: On remeron, bupropion, Xanax.  I have seen and examined this patient myself. I have spent 34 minutes in his evaluation and care.  DVT prophylaxis: Xarelto restarted Code Status: Full Family Communication: None present at the bedside Disposition Plan: Skilled nursing facility after surgery, PT evaluation.  Lise Pincus, DO [facility] Direct contact: see [email or web address]  7PM-7AM contact night coverage as above 02/28/2019, 1:09 PM  LOS: 2 days

## 2019-02-28 NOTE — Plan of Care (Signed)
Pt extremely unsteady when transferring, max assist x 3-4.  Supp not successful with BM

## 2019-02-28 NOTE — Progress Notes (Signed)
Inpatient Rehabilitation-Admissions Coordinator   CIR consult received. AC will follow for therapy evaluations since right femur fixation.   Jhonnie Garner, OTR/L  Rehab Admissions Coordinator  [phone] 02/28/2019 12:22 PM

## 2019-02-28 NOTE — Evaluation (Signed)
Physical Therapy Evaluation Patient Details Name: Jared [name] MRN: [id number] DOB: 1941/09/05 Today's Date: 02/28/2019   History of Present Illness  Pt is a 78 y.o. male with recent L TKA (02/22/19) with d/c to SNF, pt with fall at SNF with R intertrochateric femur fracture. Pt s/p R IM nail of femur on 02/27/19. PMH includes HTN, PAF, stroke, OA, anxiety.  Clinical Impression   Pt presents with R hip and L knee pain/stiffness, LE weakness, difficulty performing all mobility tasks, and decreased activity tolerance due to pain. Pt to benefit from acute PT to address deficits. Pt ambulated short room distance, required sitting in recliner due to pallor, diaphoresis, and dizzness. VSS. Pt requires mod-max assist +2 at this time, prior to L TKR and fall, pt was living indepedently. PT recommending CIR for return to PLOF. SNF listed as alternative. PT to progress mobility as tolerated, and will continue to follow acutely.      Follow Up Recommendations SNF;CIR    Equipment Recommendations  Other (comment)(defer)    Recommendations for Other Services       Precautions / Restrictions Precautions Precautions: Fall Restrictions Weight Bearing Restrictions: No RLE Weight Bearing: Weight bearing as tolerated Other Position/Activity Restrictions: LLE WBAT      Mobility  Bed Mobility Overal bed mobility: Needs Assistance Bed Mobility: Supine to Sit     Supine to sit: Mod assist;HOB elevated     General bed mobility comments: Mod assist for trunk elevation, LE lifting and translation to EOB, scooting pt forward with use of bed pad. Increased time, use of bedrails to assist pt to come to sitting.  Transfers Overall transfer level: Needs assistance Equipment used: Rolling walker (2 wheeled) Transfers: Sit to/from Stand Sit to Stand: Max assist;+2 physical assistance;+2 safety/equipment;From elevated surface         General transfer comment: Max assist +2 for power up, steadying,  bringing pt feet within BOS, hand placement when rising, and facilitation of hip extension to come to standing. Pt with 2 failed trials to stand due to lack of anterior trunk movement and insufficient power up. Last attempt +2 successful.  Ambulation/Gait Ambulation/Gait assistance: Mod assist;+2 physical assistance;+2 safety/equipment Gait Distance (Feet): 4 Feet Assistive device: Rolling walker (2 wheeled) Gait Pattern/deviations: Step-to pattern;Decreased step length - left;Decreased step length - right;Trunk flexed;Antalgic Gait velocity: very decr   General Gait Details: Mod assist for steadying, forward progression of LEs especially RLE. Pt reporting dizziness, and pt with pallor and diaphoresis. BP and HR 149/66 and 73 bpm, pt assisted to lower into recliner and reclined. Pt with improved symptoms with rest.  Stairs            Wheelchair Mobility    Modified Rankin (Stroke Patients Only)       Balance Overall balance assessment: Needs assistance;History of Falls Sitting-balance support: Bilateral upper extremity supported;Feet supported Sitting balance-Leahy Scale: Poor     Standing balance support: Bilateral upper extremity supported;During functional activity Standing balance-Leahy Scale: Poor Standing balance comment: reliant on PT and RW for steadying in standing, both statically and dynamically                             Pertinent Vitals/Pain Pain Assessment: Faces Faces Pain Scale: Hurts whole lot Pain Location: R hip, L knee; with mobility Pain Descriptors / Indicators: Sore;Guarding;Grimacing Pain Intervention(s): Limited activity within patient's tolerance;Monitored during session;Repositioned;Patient requesting pain meds-RN notified    Home Living Family/patient expects  to be discharged to:: Private residence Living Arrangements: Alone Available Help at Discharge: Other (Comment)(Pt states no one could help him upon d/c home) Type of Home:  House Home Access: Stairs to enter Entrance Stairs-Rails: None Entrance Stairs-Number of Steps: 3 Home Layout: One level Home Equipment: Cane - single point;Cane - quad      Prior Function Level of Independence: Independent         Comments: Prior to L TKA, pt indep with mobility. Was d/c to [facility] and fell before starting with PT/OT     Hand Dominance   Dominant Hand: Right    Extremity/Trunk Assessment   Upper Extremity Assessment Upper Extremity Assessment: Defer to OT evaluation    Lower Extremity Assessment Lower Extremity Assessment: Generalized weakness;RLE deficits/detail;LLE deficits/detail RLE Deficits / Details: able to perform weak quad set, heel slide to 40*, requires lift assist for SLR RLE: Unable to fully assess due to pain LLE Deficits / Details: able to perform weak quad set, heel slide to 60* (took multiple reps to decreased stiffness and pt guarding), SLR with significant lift assist during bed mobility    Cervical / Trunk Assessment Cervical / Trunk Assessment: Normal  Communication   Communication: No difficulties  Cognition Arousal/Alertness: Awake/alert Behavior During Therapy: WFL for tasks assessed/performed Overall Cognitive Status: No family/caregiver present to determine baseline cognitive functioning Area of Impairment: Problem solving;Memory;Following commands;Safety/judgement                   Current Attention Level: Sustained Memory: Decreased short-term memory Following Commands: Follows one step commands consistently;Follows one step commands with increased time Safety/Judgement: Decreased awareness of safety   Problem Solving: Requires verbal cues;Slow processing;Decreased initiation;Difficulty sequencing;Requires tactile cues        General Comments      Exercises Total Joint Exercises Ankle Circles/Pumps: AROM;10 reps;Both;Seated Quad Sets: AROM;Both;10 reps;Seated Heel Slides: AAROM;15 reps;Left;Seated    Assessment/Plan    PT Assessment Patient needs continued PT services  PT Problem List Decreased strength;Decreased mobility;Decreased safety awareness;Decreased range of motion;Decreased activity tolerance;Decreased balance;Decreased knowledge of use of DME;Pain;Decreased cognition       PT Treatment Interventions DME instruction;Therapeutic activities;Gait training;Therapeutic exercise;Balance training;Functional mobility training;Neuromuscular re-education;Patient/family education    PT Goals (Current goals can be found in the Care Plan section)  Acute Rehab PT Goals Patient Stated Goal: recover PT Goal Formulation: With patient Time For Goal Achievement: 03/14/19 Potential to Achieve Goals: Good    Frequency Min 4X/week   Barriers to discharge        Co-evaluation               AM-PAC PT "6 Clicks" Mobility  Outcome Measure Help needed turning from your back to your side while in a flat bed without using bedrails?: A Lot Help needed moving from lying on your back to sitting on the side of a flat bed without using bedrails?: A Lot Help needed moving to and from a bed to a chair (including a wheelchair)?: Total Help needed standing up from a chair using your arms (e.g., wheelchair or bedside chair)?: Total Help needed to walk in hospital room?: A Lot Help needed climbing 3-5 steps with a railing? : Total 6 Click Score: 9    End of Session Equipment Utilized During Treatment: Gait belt Activity Tolerance: Patient limited by fatigue;Patient limited by pain Patient left: in chair;with call bell/phone within reach(chair pad placed, chair alarm on wall with no batteries. RN notified.) Nurse Communication: Mobility status;Patient requests pain  meds PT Visit Diagnosis: Other abnormalities of gait and mobility (R26.89);History of falling (Z91.81);Unsteadiness on feet (R26.81)    Time: 2395-3202 PT Time Calculation (min) (ACUTE ONLY): 33 min   Charges:   PT  Evaluation $PT Eval Low Complexity: 1 Low PT Treatments $Gait Training: 8-22 mins       Julien Girt, PT Acute Rehabilitation Services Pager [phone]  Office [phone]  Gardenia Witter D Garvis Downum 02/28/2019, 1:30 PM

## 2019-03-01 DIAGNOSIS — F3341 Major depressive disorder, recurrent, in partial remission: Secondary | ICD-10-CM

## 2019-03-01 LAB — BASIC METABOLIC PANEL
Anion gap: 10 (ref 5–15)
BUN: 30 mg/dL — ABNORMAL HIGH (ref 8–23)
CO2: 24 mmol/L (ref 22–32)
Calcium: 8.3 mg/dL — ABNORMAL LOW (ref 8.9–10.3)
Chloride: 106 mmol/L (ref 98–111)
Creatinine, Ser: 0.82 mg/dL (ref 0.61–1.24)
GFR calc Af Amer: >60 mL/min (ref >60)
GFR calc non Af Amer: >60 mL/min (ref >60)
Glucose, Bld: 123 mg/dL — ABNORMAL HIGH (ref 70–99)
Potassium: 3.5 mmol/L (ref 3.5–5.1)
Sodium: 140 mmol/L (ref 135–145)

## 2019-03-01 LAB — CBC
HCT: 28.9 % — ABNORMAL LOW (ref 39.0–52.0)
Hemoglobin: 9.6 g/dL — ABNORMAL LOW (ref 13.0–17.0)
MCH: 31.4 pg (ref 26.0–34.0)
MCHC: 33.2 g/dL (ref 30.0–36.0)
MCV: 94.4 fL (ref 80.0–100.0)
Platelets: 238 10*3/uL (ref 150–400)
RBC: 3.06 MIL/uL — ABNORMAL LOW (ref 4.22–5.81)
RDW: 12.9 % (ref 11.5–15.5)
WBC: 11.6 10*3/uL — ABNORMAL HIGH (ref 4.0–10.5)
nRBC: 0 % (ref 0.0–0.2)

## 2019-03-01 MED ORDER — TRAMADOL HCL 50 MG PO TABS
50.0000 mg | ORAL_TABLET | Freq: Three times a day (TID) | ORAL | Status: DC | PRN
  Administered 2019-03-01 (×2): 100 mg via ORAL
  Administered 2019-03-03 (×2): 50 mg via ORAL
  Administered 2019-03-04 – 2019-03-05 (×3): 100 mg via ORAL
  Filled 2019-03-01: qty 2
  Filled 2019-03-01: qty 1
  Filled 2019-03-01 (×3): qty 2
  Filled 2019-03-01: qty 1
  Filled 2019-03-01: qty 2

## 2019-03-01 MED ORDER — RIVAROXABAN 10 MG PO TABS
20.0000 mg | ORAL_TABLET | Freq: Every day | ORAL | Status: DC
  Administered 2019-03-02 – 2019-03-05 (×4): 20 mg via ORAL
  Filled 2019-03-01 (×4): qty 2

## 2019-03-01 MED ORDER — RIVAROXABAN 10 MG PO TABS: 20.0000 mg | ORAL_TABLET | Freq: Every day | ORAL | Status: DC

## 2019-03-01 MED ORDER — RIVAROXABAN 10 MG PO TABS
10.0000 mg | ORAL_TABLET | Freq: Once | ORAL | Status: AC
  Administered 2019-03-01: 14:00:00 10 mg via ORAL
  Filled 2019-03-01: qty 1

## 2019-03-01 NOTE — Progress Notes (Signed)
Pt was restarted on his Xarelto this AM but it was at the ortho prophylaxis dose. He was previously on Xarelto 20mg  qday. D/w Dr Benny Lennert today and we will change xarelto to 20mg  qday.   Onnie Boer, PharmD, BCIDP, AAHIVP, CPP Infectious Disease Pharmacist 03/01/2019 10:04 AM

## 2019-03-01 NOTE — Progress Notes (Signed)
Physical Therapy Treatment Patient Details Name: Jared [name] MRN: [id number] DOB: [birth year] Today's Date: 03/01/2019    History of Present Illness Pt is a 78 y.o. male with recent L TKA (02/22/19) with d/c to SNF, pt with fall at SNF with R intertrochateric femur fracture. Pt s/p R IM nail of femur on 02/27/19. PMH includes HTN, PAF, stroke, OA, anxiety.    PT Comments    POD # 2 am session Assisted OOB to recliner was very difficult.  General bed mobility comments: demonstarted and instructed how to use a belt to self assist B LE.  Also assisted with upper body.  Increased time due to pain and "stiffness". General transfer comment: pt required Max/Total Assist from elevated bed + 2 side by side with great difficulty getting to upright position.  Used B Designer, television/film set.  Pt limited by L knee pain and R hip "tightness".  Stand to sit was even more difficult.  Had to tilt walker backward and downward as pt sat as well as controll desend.General Gait Details: very limited ability to weight shift and great difficulty advancing either LE.   Follow Up Recommendations  SNF  CIR did not approve pt   Equipment Recommendations       Recommendations for Other Services       Precautions / Restrictions Precautions Precautions: Fall Restrictions Weight Bearing Restrictions: No RLE Weight Bearing: Weight bearing as tolerated    Mobility  Bed Mobility Overal bed mobility: Needs Assistance Bed Mobility: Supine to Sit     Supine to sit: Max assist;Mod assist     General bed mobility comments: demonstarted and instructed how to use a belt to self assist B LE.  Also assisted with upper body.  Increased time due to pain and "stiffness".  Transfers Overall transfer level: Needs assistance Equipment used: Bilateral platform walker(EVA walker) Transfers: Sit to/from Stand Sit to Stand: Max assist;+2 physical assistance;+2 safety/equipment;From elevated surface;Total assist          General transfer comment: pt required Max/Total Assist from elevated bed + 2 side by side with great difficulty getting to upright position.  Used B Designer, television/film set.  Pt limited by L knee pain and R hip "tightness".  Stand to sit was even more difficult.  Had to tilt walker backward and downward as pt sat as well as controll desend.  Ambulation/Gait   Gait Distance (Feet): 1 Feet(a few short steps) Assistive device: Bilateral platform walker(EVA walker)       General Gait Details: very limited ability to weight shift and great difficulty advancing either LE.   Stairs             Wheelchair Mobility    Modified Rankin (Stroke Patients Only)       Balance                                            Cognition Arousal/Alertness: Awake/alert Behavior During Therapy: WFL for tasks assessed/performed Overall Cognitive Status: Within Functional Limits for tasks assessed                                        Exercises      General Comments        Pertinent Vitals/Pain Pain Assessment: 0-10 Pain Score:  6  Pain Location: R hip pain 4/10 and L knee pain 6/10 Pain Descriptors / Indicators: Grimacing;Operative site guarding Pain Intervention(s): Monitored during session;Repositioned;Ice applied    Home Living                      Prior Function            PT Goals (current goals can now be found in the care plan section) Progress towards PT goals: Progressing toward goals    Frequency    Min 4X/week      PT Plan Current plan remains appropriate    Co-evaluation              AM-PAC PT "6 Clicks" Mobility   Outcome Measure  Help needed turning from your back to your side while in a flat bed without using bedrails?: Total Help needed moving from lying on your back to sitting on the side of a flat bed without using bedrails?: Total Help needed moving to and from a bed to a chair (including a  wheelchair)?: Total Help needed standing up from a chair using your arms (e.g., wheelchair or bedside chair)?: Total Help needed to walk in hospital room?: Total Help needed climbing 3-5 steps with a railing? : Total 6 Click Score: 6    End of Session Equipment Utilized During Treatment: Gait belt Activity Tolerance: Patient limited by pain Patient left: in chair Nurse Communication: Mobility status PT Visit Diagnosis: Other abnormalities of gait and mobility (R26.89);History of falling (Z91.81);Unsteadiness on feet (R26.81)     Time: 1245-8099 PT Time Calculation (min) (ACUTE ONLY): 26 min  Charges:  $Gait Training: 8-22 mins $Therapeutic Activity: 8-22 mins                     {Jak Haggar  PTA Acute  Rehabilitation Services Pager      [phone] Office      [phone]

## 2019-03-01 NOTE — Progress Notes (Signed)
Physical Therapy Treatment Patient Details Name: Jared [name] MRN: [id number] DOB: 1940-11-25 Today's Date: 03/01/2019    History of Present Illness Pt is a 78 y.o. male with recent L TKA (02/22/19) with d/c to SNF, pt with fall at SNF with R intertrochateric femur fracture. Pt s/p R IM nail of femur on 02/27/19. PMH includes HTN, PAF, stroke, OA, anxiety.    PT Comments    POD # 2 pm session CoTx with OT eval Assisted pt out of recliner to take a few side steps to bed.  Assisted back to bed then performed 10 reps HS B LE using belt to self assist. Pt will need ST Rehab at SNF.   Follow Up Recommendations  SNF     Equipment Recommendations       Recommendations for Other Services       Precautions / Restrictions Precautions Precautions: Fall Restrictions Weight Bearing Restrictions: No RLE Weight Bearing: Weight bearing as tolerated Other Position/Activity Restrictions: LLE WBAT    Mobility  Bed Mobility Overal bed mobility: Needs Assistance Bed Mobility: Sit to Supine     Supine to sit: Max assist;Mod assist Sit to supine: +2 for physical assistance;Max assist;Total assist   General bed mobility comments: assisted back to bed and positioned to comfort  Transfers Overall transfer level: Needs assistance Equipment used: Bilateral platform walker Transfers: Sit to/from Stand Sit to Stand: Max assist;Total assist;+2 physical assistance         General transfer comment: + 2 side by side assist to rise from recliner with much effort.  Great difficulty self performing B LE weakness/pain/recent surgery on both.  Once upright, pt able to tolerate stance but with much WBing thru B UE's on EVA walker.  Ambulation/Gait   Gait Distance (Feet): 1 Feet(a few short steps) Assistive device: Bilateral platform walker(EVA walker)    + 2 Max/Total Assist   General Gait Details: pt able to take a few very small pivot steps from recliner to bed   Stairs              Wheelchair Mobility    Modified Rankin (Stroke Patients Only)       Balance             Standing balance-Leahy Scale: Poor                              Cognition Arousal/Alertness: Awake/alert Behavior During Therapy: WFL for tasks assessed/performed Overall Cognitive Status: Within Functional Limits for tasks assessed                                        Exercises      General Comments        Pertinent Vitals/Pain Pain Assessment: 0-10 Pain Score: 8  Faces Pain Scale: Hurts whole lot Pain Location: R hip moreso than L knee Pain Descriptors / Indicators: Grimacing;Operative site guarding Pain Intervention(s): Limited activity within patient's tolerance;Monitored during session;Repositioned    Home Living Family/patient expects to be discharged to:: Skilled nursing facility               Additional Comments: was at snf for a couple of hours    Prior Function Level of Independence: Needs assistance      Comments: assistance for ADLs since TKA   PT Goals (current goals can now be found  in the care plan section) Acute Rehab PT Goals Patient Stated Goal: recover Progress towards PT goals: Progressing toward goals    Frequency    Min 4X/week      PT Plan Current plan remains appropriate    Co-evaluation   Reason for Co-Treatment: For patient/therapist safety PT goals addressed during session: Mobility/safety with mobility OT goals addressed during session: ADL's and self-care      AM-PAC PT "6 Clicks" Mobility   Outcome Measure  Help needed turning from your back to your side while in a flat bed without using bedrails?: Total Help needed moving from lying on your back to sitting on the side of a flat bed without using bedrails?: Total Help needed moving to and from a bed to a chair (including a wheelchair)?: Total Help needed standing up from a chair using your arms (e.g., wheelchair or bedside chair)?:  Total Help needed to walk in hospital room?: Total Help needed climbing 3-5 steps with a railing? : Total 6 Click Score: 6    End of Session Equipment Utilized During Treatment: Gait belt Activity Tolerance: Patient limited by pain Patient left: in bed Nurse Communication: Mobility status PT Visit Diagnosis: Other abnormalities of gait and mobility (R26.89);History of falling (Z91.81);Unsteadiness on feet (R26.81)     Time: 0349-1791 PT Time Calculation (min) (ACUTE ONLY): 23 min  Charges:  $Gait Training: 8-22 mins $Therapeutic Activity: 8-22 mins                     Rica Koyanagi  PTA Acute  Rehabilitation Services Pager      [phone] Office      [phone]

## 2019-03-01 NOTE — NC FL2 (Signed)
[place] LEVEL OF CARE SCREENING TOOL     IDENTIFICATION  Patient Name: Jared [name] Birthdate: Jul 27, 1941 Sex: male Admission Date (Current Location): 02/26/2019  [facility] and [place] Number:  Herbalist and Address:  [facility],  [place], [place]      Provider Number: [id number]  Attending Physician Name and Address:  Karie Kirks, DO  Relative Name and Phone Number:    Oris Drone, [phone]    Current Level of Care: SNF Recommended Level of Care: [place] Prior Approval Number:    Date Approved/Denied:   PASRR Number:   [id number]  Discharge Plan: SNF    Current Diagnoses: Patient Active Problem List   Diagnosis Date Noted  . Closed comminuted intertrochanteric fracture of proximal end of right femur ([place]) 02/27/2019  . Fall 02/26/2019  . Closed displaced intertrochanteric fracture of right femur ([place]) 02/26/2019  . Closed right hip fracture ([place]) 02/26/2019  . Stroke ([place]) 02/26/2019  . Femur fracture, right ([place]) 02/26/2019  . Status post total knee replacement, left 02/22/2019  . Swelling of right foot 01/04/2019  . Preoperative evaluation to rule out surgical contraindication 11/14/2018  . Hematuria 10/28/2018  . Osteoarthritis of right knee 06/20/2018  . Atrial fibrillation ([place])   . MDD (major depressive disorder), recurrent, in partial remission ([place])   . Benzodiazepine dependence ([place])   . Ejection fraction < 50% 11/07/2017  . Hyperlipidemia   . Dizziness 07/27/2017  . Frequent PVCs 07/27/2017  . Cryptogenic stroke ([place]) 07/27/2017  . Diverticulosis of colon without hemorrhage 02/06/2013  . Right inguinal hernia 10/12/2012  . BPH (benign prostatic hyperplasia) 04/08/2009  . OSA (obstructive sleep apnea) 10/02/2008  . Essential hypertension 10/05/2007  . Allergic rhinitis 10/05/2007  . GERD 10/05/2007  . History of colonic polyps 10/05/2007  . NEPHROLITHIASIS, HX OF  10/05/2007    Orientation RESPIRATION BLADDER Height & Weight     Self, Time, Situation, Place  Normal Continent Weight: 204 lb 2.3 oz (92.6 kg) Height:  5\' 10"  (177.8 cm)  BEHAVIORAL SYMPTOMS/MOOD NEUROLOGICAL BOWEL NUTRITION STATUS      Continent Diet(Low Sodium Heart Healthy)  AMBULATORY STATUS COMMUNICATION OF NEEDS Skin   Extensive Assist Verbally Surgical Wounds                      Personal Care Assistance Level of Assistance    Bathing Assistance: Limited assistance Feeding assistance: Independent Dressing Assistance: Limited assistance     Functional Limitations Info  Sight, Hearing, Speech Sight Info: Adequate Hearing Info: Adequate Speech Info: Adequate    SPECIAL CARE FACTORS FREQUENCY  PT (By licensed PT), OT (By licensed OT)     PT Frequency: 5x/week OT Frequency: 5x/week            Contractures Contractures Info: Not present    Additional Factors Info  Code Status, Allergies, Psychotropic Code Status Info: Fullcode Allergies Info: Allergies: Antihistamines, Diphenhydramine-type, Aspirin, Codeine, Pollen Extract Psychotropic Info: Remeron, Wellbutrin         Current Medications (03/01/2019):  This is the current hospital active medication list Current Facility-Administered Medications  Medication Dose Route Frequency Provider Last Rate Last Dose  . amLODipine (NORVASC) tablet 10 mg  10 mg Oral Daily Rod Can, MD   10 mg at 03/01/19 0941  . bisacodyl (DULCOLAX) suppository 10 mg  10 mg Rectal Daily PRN [name], MD      . buPROPion [facility] SR) 12 hr tablet  150 mg  150 mg Oral BID Rod Can, MD   150 mg at 03/01/19 0940  . carvedilol (COREG) tablet 3.125 mg  3.125 mg Oral BID WC Rod Can, MD   3.125 mg at 03/01/19 0731  . cycloSPORINE (RESTASIS) 0.05 % ophthalmic emulsion 1 drop  1 drop Both Eyes BID Rod Can, MD   1 drop at 03/01/19 0940  . docusate sodium (COLACE) capsule 100 mg  100 mg Oral BID Rod Can, MD   100 mg at 03/01/19 0940  . dutasteride (AVODART) capsule 0.5 mg  0.5 mg Oral Daily Swinteck, Aaron Edelman, MD   0.5 mg at 03/01/19 0940  . hydrALAZINE (APRESOLINE) injection 5 mg  5 mg Intravenous Q2H PRN Swinteck, Aaron Edelman, MD      . lactulose (CHRONULAC) 10 GM/15ML solution 20 g  20 g Oral TID Swayze, Ava, DO   20 g at 03/01/19 0941  . menthol-cetylpyridinium (CEPACOL) lozenge 3 mg  1 lozenge Oral PRN Swinteck, Aaron Edelman, MD       Or  . phenol (CHLORASEPTIC) mouth spray 1 spray  1 spray Mouth/Throat PRN Swinteck, Aaron Edelman, MD      . methocarbamol (ROBAXIN) tablet 500 mg  500 mg Oral Q8H PRN Rod Can, MD   500 mg at 03/01/19 1052  . metoCLOPramide (REGLAN) tablet 5-10 mg  5-10 mg Oral Q8H PRN Swinteck, Aaron Edelman, MD       Or  . metoCLOPramide (REGLAN) injection 5-10 mg  5-10 mg Intravenous Q8H PRN Swinteck, Aaron Edelman, MD      . mirtazapine (REMERON) tablet 15 mg  15 mg Oral QHS Rod Can, MD   15 mg at 02/28/19 2210  . morphine 2 MG/ML injection 2 mg  2 mg Intravenous Q4H PRN Rod Can, MD   2 mg at 02/26/19 2326  . multivitamin with minerals tablet 1 tablet  1 tablet Oral Daily Rod Can, MD   1 tablet at 03/01/19 0941  . ondansetron (ZOFRAN) tablet 4 mg  4 mg Oral Q6H PRN Swinteck, Aaron Edelman, MD       Or  . ondansetron (ZOFRAN) injection 4 mg  4 mg Intravenous Q6H PRN Swinteck, Aaron Edelman, MD      . oxyCODONE-acetaminophen (PERCOCET/ROXICET) 5-325 MG per tablet 1 tablet  1 tablet Oral Q4H PRN Rod Can, MD   1 tablet at 03/01/19 1052  . pantoprazole (PROTONIX) EC tablet 40 mg  40 mg Oral Daily Rod Can, MD   40 mg at 03/01/19 0940  . polyvinyl alcohol (LIQUIFILM TEARS) 1.4 % ophthalmic solution 1 drop  1 drop Both Eyes Daily PRN Swinteck, Aaron Edelman, MD      . rivaroxaban (XARELTO) tablet 10 mg  10 mg Oral Once Swayze, Ava, DO      . [START ON 03/02/2019] rivaroxaban (XARELTO) tablet 20 mg  20 mg Oral Q breakfast Swayze, Ava, DO      . senna-docusate (Senokot-S) tablet 1 tablet  1  tablet Oral QHS PRN Swinteck, Aaron Edelman, MD      . timolol (TIMOPTIC) 0.5 % ophthalmic solution 1 drop  1 drop Both Eyes BID Rod Can, MD   1 drop at 03/01/19 0945     Discharge Medications: Please see discharge summary for a list of discharge medications.  Relevant Imaging Results:  Relevant Lab Results:   Additional Information WYO:[id number]  Lia Hopping, LCSW

## 2019-03-01 NOTE — Progress Notes (Addendum)
Orthopedics Progress Note  Subjective: Left knee still hurts but he reports doing well with therapy  Objective:  Vitals:   02/28/19 2139 03/01/19 0452  BP: (!) 149/65 (!) 148/61  Pulse: (!) 55 76  Resp: 20 20  Temp: 98.7 F (37.1 C) 99.1 F (37.3 C)  SpO2: 96% 94%    General: Awake and alert  Musculoskeletal: left knee incision CDI, Aquacel in place, minimal swelling. Right hip dressing CDI Neurovascularly intact  Lab Results  Component Value Date   WBC 11.6 (H) 03/01/2019   HGB 9.6 (L) 03/01/2019   HCT 28.9 (L) 03/01/2019   MCV 94.4 03/01/2019   PLT 238 03/01/2019       Component Value Date/Time   NA 140 03/01/2019 0756   K 3.5 03/01/2019 0756   CL 106 03/01/2019 0756   CO2 24 03/01/2019 0756   GLUCOSE 123 (H) 03/01/2019 0756   BUN 30 (H) 03/01/2019 0756   CREATININE 0.82 03/01/2019 0756   CALCIUM 8.3 (L) 03/01/2019 0756   GFRNONAA >60 03/01/2019 0756   GFRAA >60 03/01/2019 0756    Lab Results  Component Value Date   INR 2.0 (H) 02/26/2019   INR 1.03 07/26/2017    Assessment/Plan: POD #7 s/p Procedure(s): Left knee TKR S/p Right hip INTRAMEDULLARY (IM) NAIL FEMORAL COntinue PT, OT  Mobilization  DVT prophylaxis mechanical and Xarelto Patient is still a little confused.  Will D/C Morphine and Percocet Start Tramadol as needed for pain I do not think he will be ready for SNF until next Monday at the earliest  [place]. Veverly Fells, MD 03/01/2019 12:29 PM

## 2019-03-01 NOTE — Evaluation (Signed)
Occupational Therapy Evaluation Patient Details Name: Jared [name] MRN: [id number] DOB: 1941-08-15 Today's Date: 03/01/2019    History of Present Illness Pt is a 78 y.o. male with recent L TKA (02/22/19) with d/c to SNF, pt with fall at SNF with R intertrochateric femur fracture. Pt s/p R IM nail of femur on 02/27/19. PMH includes HTN, PAF, stroke, OA, anxiety.   Clinical Impression   Pt was admitted for the above. He was at snf for rehab following TKA and needed assist for adls.  Will follow in acute setting focusing on mobility to support ADLs.  Once he can stand with mod +2, will add goals focusing on use of AE to improve ADLs    Follow Up Recommendations  SNF    Equipment Recommendations  3 in 1 bedside commode    Recommendations for Other Services       Precautions / Restrictions Precautions Precautions: Fall Restrictions Weight Bearing Restrictions: No RLE Weight Bearing: Weight bearing as tolerated Other Position/Activity Restrictions: LLE WBAT      Mobility Bed Mobility Overal bed mobility: Needs Assistance Bed Mobility: Supine to Sit     Supine to sit: Max assist;Mod assist Sit to supine: Min assist;Mod assist   General bed mobility comments: mostly assist for legs, guided trunk  Transfers Overall transfer level: Needs assistance Equipment used: Bilateral platform walker Transfers: Sit to/from Stand Sit to Stand: Max assist;Total assist;+2 physical assistance         General transfer comment: pt required Max/Total Assist from recliner + 2 side by side with great difficulty getting to upright position.  Used B Designer, television/film set.      Balance             Standing balance-Leahy Scale: Poor                             ADL either performed or assessed with clinical judgement   ADL Overall ADL's : Needs assistance/impaired Eating/Feeding: Independent   Grooming: Set up   Upper Body Bathing: Set up   Lower Body Bathing: Maximal  assistance;Total assistance;+2 for physical assistance;Sit to/from stand(for sit to stand)   Upper Body Dressing : Minimal assistance   Lower Body Dressing: Total assistance;+2 for physical assistance;Sit to/from stand   Toilet Transfer: Maximal assistance;Total assistance;+2 for physical assistance;Stand-pivot;RW(to bed)             General ADL Comments: pt about 20% for sit to stand during transfers/LB adls     Vision         Perception     Praxis      Pertinent Vitals/Pain Pain Assessment: Faces Pain Score: 6  Faces Pain Scale: Hurts whole lot Pain Location: R hip moreso than L knee Pain Descriptors / Indicators: Grimacing;Operative site guarding Pain Intervention(s): Limited activity within patient's tolerance;Monitored during session;Repositioned     Hand Dominance     Extremity/Trunk Assessment Upper Extremity Assessment Upper Extremity Assessment: Generalized weakness(grossly 4/5)           Communication Communication Communication: No difficulties   Cognition Arousal/Alertness: Awake/alert Behavior During Therapy: WFL for tasks assessed/performed Overall Cognitive Status: Within Functional Limits for tasks assessed                                     General Comments       Exercises  Shoulder Instructions      Home Living Family/patient expects to be discharged to:: Skilled nursing facility                                 Additional Comments: was at snf for a couple of hours      Prior Functioning/Environment Level of Independence: Needs assistance        Comments: assistance for ADLs since TKA        OT Problem List: Decreased strength;Decreased activity tolerance;Pain;Decreased knowledge of use of DME or AE;Impaired balance (sitting and/or standing)      OT Treatment/Interventions: Self-care/ADL training;Energy conservation;DME and/or AE instruction;Patient/family education;Balance  training;Therapeutic activities    OT Goals(Current goals can be found in the care plan section) Acute Rehab OT Goals Patient Stated Goal: recover OT Goal Formulation: With patient Time For Goal Achievement: 03/08/19 Potential to Achieve Goals: Good ADL Goals Pt Will Transfer to Toilet: with +2 assist;stand pivot transfer;bedside commode;with mod assist Additional ADL Goal #1: pt will go from sit to stand for ADLs with mod +2, maintain for 2 minutes with min A for adls  OT Frequency: Min 2X/week   Barriers to D/C:            Co-evaluation PT/OT/SLP Co-Evaluation/Treatment: Yes Reason for Co-Treatment: For patient/therapist safety PT goals addressed during session: Mobility/safety with mobility OT goals addressed during session: ADL's and self-care      AM-PAC OT "6 Clicks" Daily Activity     Outcome Measure Help from another person eating meals?: None Help from another person taking care of personal grooming?: A Little Help from another person toileting, which includes using toliet, bedpan, or urinal?: Total Help from another person bathing (including washing, rinsing, drying)?: A Lot Help from another person to put on and taking off regular upper body clothing?: A Little Help from another person to put on and taking off regular lower body clothing?: Total 6 Click Score: 14   End of Session    Activity Tolerance: Patient limited by fatigue;Patient limited by pain Patient left: in bed;with call bell/phone within reach;with bed alarm set  OT Visit Diagnosis: Pain Pain - Right/Left: Right Pain - part of body: Hip                Time: 2440-1027 OT Time Calculation (min): 18 min Charges:  OT General Charges $OT Visit: 1 Visit OT Evaluation $OT Eval Low Complexity: 1 Low  Lesle Chris, OTR/L Acute Rehabilitation Services [phone] [facility] pager [phone] office 03/01/2019  [place] 03/01/2019, 2:49 PM

## 2019-03-01 NOTE — Progress Notes (Signed)
PROGRESS NOTE  Jared [name] ZWC:[id number] DOB: 1941-08-04 DOA: 02/26/2019 PCP: [name], MD  Brief History    Pt was recently hospitalized from 6/5-6/8 for left knee replacement on 6/5. He was just discharged to rehab facility yesterday. He states that he fell accidentally when he tried to turn the light on in facility, injured his right hip, causing severe pain.  The pain is constant, 10 out of 10 severity, sharp, nonradiating. He denies loss of consciousness.  No head or neck injury.  Patient denies chest pain, shortness breath, cough, fever or chills.  No nausea vomiting, diarrhea, abdominal pain, symptoms of UTI or unilateral weakness. He states that he started taking Xarelto yesterday, but does not remember at what time he took it.  ED Course: pt was found to have WBC 10.6, negative COVID-19 test, electrolytes renal function okay, bradycardia with heart rate 41-45, no tachypnea, oxygen saturation 93% on room air, temperature 99.7.  Chest x-ray negative.  X-ray of left knee showed postsurgical change without acute issues.  CT head is negative for acute intracranial abnormalities.  CT of C-spine is negative for acute bony fracture. Patient is admitted to telemetry bed as inpatient. Dr. Veverly Fells of ortho was consulted.   # X-ray of her right femur showed: Acute displaced intratrochanteric fracture of the proximal right femur.  Orthopedic surgery was consulted. The patient went for IM Nailing of right proximal femur on 02/27/2019.  Consultants  . Orthopedic surgery  Procedures  . IM Nailing of right proximal femur on 02/27/2019.  Antibiotics  . Peri-operative Ancef  Subjective  Today the patient is sitting up at bedside. He continues to complain of spasms. He has not yet had a BM.  Objective   Vitals:  Vitals:   02/28/19 2139 03/01/19 0452  BP: (!) 149/65 (!) 148/61  Pulse: (!) 55 76  Resp: 20 20  Temp: 98.7 F (37.1 C) 99.1 F (37.3 C)  SpO2: 96% 94%    Exam:   Constitutional:  . He is awake, alert, and oriented x 3. No acute distress. Respiratory:  . No increased work of breathing.  . No wheezes, rales, or rhonchi . No tactile fremitus Cardiovascular:  . Regular rate and rhythm. . No murmurs, ectopy, or gallups . No LE extremity edema   . Normal pedal pulses Abdomen:  . Abdomen is soft, non-tender, non-distended . No hernias, masses, or organomegaly is appreciated . Normoactive bowel sounds.  Musculoskeletal:  . Digits/nails BUE: no clubbing, cyanosis, petechiae, infection Skin:  . No rashes, lesions, ulcers . palpation of skin: no induration or nodules Neurologic:  . CN 2-12 intact . Sensation all 4 extremities intact Psychiatric:  . Mental status o Mood, affect appropriate o Orientation to person, place, time  . judgment and insight appear intact   I have personally reviewed the following:   Today's Data  . CBC, BMP, Glucose, Vitals  Scheduled Meds: . amLODipine  10 mg Oral Daily  . buPROPion  150 mg Oral BID  . carvedilol  3.125 mg Oral BID WC  . cycloSPORINE  1 drop Both Eyes BID  . docusate sodium  100 mg Oral BID  . dutasteride  0.5 mg Oral Daily  . lactulose  20 g Oral TID  . mirtazapine  15 mg Oral QHS  . multivitamin with minerals  1 tablet Oral Daily  . pantoprazole  40 mg Oral Daily  . [START ON 03/02/2019] rivaroxaban  20 mg Oral Q breakfast  . timolol  1  drop Both Eyes BID   Continuous Infusions:  Principal Problem:   Closed displaced intertrochanteric fracture of right femur (HCC) Active Problems:   Essential hypertension   GERD   BPH (benign prostatic hyperplasia)   Atrial fibrillation (HCC)   MDD (major depressive disorder), recurrent, in partial remission ([place])   Benzodiazepine dependence ([place])   Status post total knee replacement, left   Fall   Stroke [facility])   Femur fracture, right ([place])   Closed comminuted intertrochanteric fracture of proximal end of right femur ([place])   LOS: 3 days    A  & P   Closed displaced intertrochanteric fracture right femur: Following a mechanical fall .Othopedics following.  Pt underwent IM nailing of Right proximal femur on 02/27/2019. He has done well, except for spasms in the right thigh.  PT/OTfollowing. Continue pain management. Orthopedic surgery feels that he will not be ready for SNF until Monday.  Status post total knee replacement of the left: Orthopedics following. Continue pain management. Has a brace on place. Ancipate need for rehab on discharge.  Paroxysmal A. Fib: CHA2DS2-VASc Scoreis 5.  On Xarelto at home.  Heart rate is well controlled.  Continue Coreg.  Xarelto held for surgery, resume after surgery as per orthopedic surgery.  Hypertension: Continue current medications.  AM blood pressures improved with addition of lisinopril  GERD: Continue Protonix  BPH: On dutasteride  Depressive disorder: On remeron, bupropion, Xanax.  I have seen and examined this patient myself. I have spent 30 minutes in his evaluation and care.  DVT prophylaxis: Xarelto restarted Code Status: Full Family Communication: None present at the bedside Disposition Plan: Skilled nursing facility after surgery, PT evaluation.  Kenna Kirn, DO [facility] Direct contact: see [email or web address]  7PM-7AM contact night coverage as above

## 2019-03-01 NOTE — TOC Progression Note (Signed)
Transition of Care [facility]) - Progression Note    Patient Details  Name: Jared [name] MRN: [id number] Date of Birth: Jun 28, 1941  Transition [facility] CM/SW [place], [place] Phone Number: 03/01/2019, 12:21 PM  Clinical Narrative:    A TOC assessment was complete on 6/12. Patient admitted from [facility] after having a fall. Patient is agreeable to another SNF facility.  FL2 complete. CSW will follow up with bed offers.    Expected Discharge Plan: Skilled Nursing Facility Barriers to Discharge: Insurance Authorization   Expected Discharge Plan and Services Expected Discharge Plan: [place] In-house Referral: Clinical Social Work     Living arrangements for the past 2 months: Single Family Home Expected Discharge Date: 02/26/19                                    Social Determinants of Health (SDOH) Interventions    Readmission Risk Interventions No flowsheet data found.

## 2019-03-01 NOTE — Plan of Care (Signed)
progressing 

## 2019-03-01 NOTE — Care Management Important Message (Signed)
Important Message  Patient Details IM Letter given to Kathrin Greathouse SW to present to the Patient Name: ROMAR WOODRICK MRN: [id number] Date of Birth: May 23, 1941   Medicare Important Message Given:  Yes    Kerin Salen 03/01/2019, 11:17 AM

## 2019-03-02 LAB — CBC
HCT: 26.8 % — ABNORMAL LOW (ref 39.0–52.0)
Hemoglobin: 8.8 g/dL — ABNORMAL LOW (ref 13.0–17.0)
MCH: 31.5 pg (ref 26.0–34.0)
MCHC: 32.8 g/dL (ref 30.0–36.0)
MCV: 96.1 fL (ref 80.0–100.0)
Platelets: 243 10*3/uL (ref 150–400)
RBC: 2.79 MIL/uL — ABNORMAL LOW (ref 4.22–5.81)
RDW: 12.9 % (ref 11.5–15.5)
WBC: 10 10*3/uL (ref 4.0–10.5)
nRBC: 0 % (ref 0.0–0.2)

## 2019-03-02 NOTE — Progress Notes (Signed)
PROGRESS NOTE  Jared [name] [id number] DOB: 1940/12/05 DOA: 02/26/2019 PCP: [name], MD  Brief History    Pt was recently hospitalized from 6/5-6/8 for left knee replacement on 6/5. He was just discharged to rehab facility yesterday. He states that he fell accidentally when he tried to turn the light on in facility, injured his right hip, causing severe pain.  The pain is constant, 10 out of 10 severity, sharp, nonradiating. He denies loss of consciousness.  No head or neck injury.  Patient denies chest pain, shortness breath, cough, fever or chills.  No nausea vomiting, diarrhea, abdominal pain, symptoms of UTI or unilateral weakness. He states that he started taking Xarelto yesterday, but does not remember at what time he took it.  ED Course: pt was found to have WBC 10.6, negative COVID-19 test, electrolytes renal function okay, bradycardia with heart rate 41-45, no tachypnea, oxygen saturation 93% on room air, temperature 99.7.  Chest x-ray negative.  X-ray of left knee showed postsurgical change without acute issues.  CT head is negative for acute intracranial abnormalities.  CT of C-spine is negative for acute bony fracture. Patient is admitted to telemetry bed as inpatient. Dr. Veverly Fells of ortho was consulted.   # X-ray of her right femur showed: Acute displaced intratrochanteric fracture of the proximal right femur.  Orthopedic surgery was consulted. The patient went for IM Nailing of right proximal femur on 02/27/2019.  Consultants  . Orthopedic surgery  Procedures  . IM Nailing of right proximal femur on 02/27/2019.  Antibiotics  . Peri-operative Ancef  Subjective  Today the patient is sitting up on the edge of the bed preparing to work with PT. No new complaints.  Objective   Vitals:  Vitals:   03/02/19 0604 03/02/19 1308  BP: (!) 152/77 (!) 147/61  Pulse: 70 (!) 44  Resp: 16 15  Temp: 98.4 F (36.9 C) 98.4 F (36.9 C)  SpO2: 95% 99%    Exam:   Constitutional:  . He is awake, alert, and oriented x 3. No acute distress. Respiratory:  . No increased work of breathing.  . No wheezes, rales, or rhonchi . No tactile fremitus Cardiovascular:  . Regular rate and rhythm. . No murmurs, ectopy, or gallups . No LE extremity edema   . Normal pedal pulses Abdomen:  . Abdomen is soft, non-tender, non-distended . No hernias, masses, or organomegaly is appreciated . Normoactive bowel sounds.  Musculoskeletal:  . Digits/nails BUE: no clubbing, cyanosis, petechiae, infection Skin:  . No rashes, lesions, ulcers . palpation of skin: no induration or nodules Neurologic:  . CN 2-12 intact . Sensation all 4 extremities intact Psychiatric:  . Mental status o Mood, affect appropriate o Orientation to person, place, time  . judgment and insight appear intact   I have personally reviewed the following:   Today's Data  . CBC, BMP, Glucose, Vitals  Scheduled Meds: . amLODipine  10 mg Oral Daily  . buPROPion  150 mg Oral BID  . carvedilol  3.125 mg Oral BID WC  . cycloSPORINE  1 drop Both Eyes BID  . docusate sodium  100 mg Oral BID  . dutasteride  0.5 mg Oral Daily  . lactulose  20 g Oral TID  . mirtazapine  15 mg Oral QHS  . multivitamin with minerals  1 tablet Oral Daily  . pantoprazole  40 mg Oral Daily  . rivaroxaban  20 mg Oral Q breakfast  . timolol  1 drop Both Eyes BID  Continuous Infusions:  Principal Problem:   Closed displaced intertrochanteric fracture of right femur (HCC) Active Problems:   Essential hypertension   GERD   BPH (benign prostatic hyperplasia)   Atrial fibrillation (HCC)   MDD (major depressive disorder), recurrent, in partial remission ([place])   Benzodiazepine dependence ([place])   Status post total knee replacement, left   Fall   Stroke [facility])   Femur fracture, right ([place])   Closed comminuted intertrochanteric fracture of proximal end of right femur ([place])   LOS: 4 days    A & P   Closed  displaced intertrochanteric fracture right femur: Following a mechanical fall .Othopedics following.  Pt underwent IM nailing of Right proximal femur on 02/27/2019. He has done well, except for spasms in the right thigh.  PT/OTfollowing. Continue pain management. Orthopedic surgery feels that he will not be ready for SNF until Monday.  Status post total knee replacement of the left: Orthopedics following. Continue pain management. Has a brace on place. Ancipate need for rehab on discharge.  Paroxysmal A. Fib: CHA2DS2-VASc Scoreis 5.  On Xarelto at home.  Heart rate is well controlled.  Continue Coreg.  Xarelto held for surgery, resume after surgery as per orthopedic surgery.  Constipation: Resolved. Continue daily miralax.  Hypertension: Continue current medications.  AM blood pressures improved with addition of lisinopril  GERD: Continue Protonix  BPH: On dutasteride  Depressive disorder: On remeron, bupropion, Xanax.  I have seen and examined this patient myself. I have spent 30 minutes in his evaluation and care.  DVT prophylaxis: Xarelto restarted Code Status: Full Family Communication: None present at the bedside Disposition Plan: Skilled nursing facility after surgery, PT evaluation.  Venola Castello, DO [facility] Direct contact: see [email or web address]  7PM-7AM contact night coverage as above

## 2019-03-02 NOTE — Progress Notes (Signed)
Jared [name]  MRN: [id number] DOB/Age: 01-27-1941 78 y.o. [facility] Procedure: Procedure(s) (LRB): INTRAMEDULLARY (IM) NAIL FEMORAL (Right)     Subjective: Awake and alert. Wants to get up and stretch  Vital Signs Temp:  [98.4 F (36.9 C)-98.8 F (37.1 C)] 98.4 F (36.9 C) (06/13 0604) Pulse Rate:  [70-83] 70 (06/13 0604) Resp:  [14-16] 16 (06/13 0604) BP: (141-152)/(69-77) 152/77 (06/13 0604) SpO2:  [95 %-96 %] 95 % (06/13 0604)  Lab Results Recent Labs    03/01/19 0756 03/02/19 0251  WBC 11.6* 10.0  HGB 9.6* 8.8*  HCT 28.9* 26.8*  PLT 238 243   BMET Recent Labs    02/28/19 0507 03/01/19 0756  NA 140 140  K 4.1 3.5  CL 108 106  CO2 24 24  GLUCOSE 162* 123*  BUN 31* 30*  CREATININE 0.86 0.82  CALCIUM 8.7* 8.3*   INR  Date Value Ref Range Status  02/26/2019 2.0 (H) 0.8 - 1.2 Final    Comment:    (NOTE) INR goal varies based on device and disease states. Performed at [facility], [place] [address] [place], [place] [address]      Exam Both incisions are clean and dry to R hip, and L knee        Plan Cont inpatient until [facility] Monday  [facility] PA-C  03/02/2019, 10:00 AM Contact # [phone]

## 2019-03-03 MED ORDER — TRAMADOL HCL 50 MG PO TABS
50.0000 mg | ORAL_TABLET | Freq: Once | ORAL | Status: AC
  Administered 2019-03-04: 50 mg via ORAL
  Filled 2019-03-03: qty 1

## 2019-03-03 MED ORDER — ACETAMINOPHEN 325 MG PO TABS
650.0000 mg | ORAL_TABLET | Freq: Four times a day (QID) | ORAL | Status: DC | PRN
  Administered 2019-03-04 (×2): 650 mg via ORAL
  Filled 2019-03-03 (×2): qty 2

## 2019-03-03 MED ORDER — HYDROCODONE-ACETAMINOPHEN 5-325 MG PO TABS
1.0000 | ORAL_TABLET | Freq: Four times a day (QID) | ORAL | Status: DC | PRN
  Filled 2019-03-03: qty 1

## 2019-03-03 NOTE — TOC Progression Note (Signed)
Transition of Care [facility]) - Progression Note    Patient Details  Name: FRANCISCO OSTROVSKY MRN: [id number] Date of Birth: 30-Aug-1941  Transition of Care [facility]) CM/SW [place], [place] Phone Number: 03/03/2019, 3:27 PM  Clinical Narrative:   LCSW completed call to [employer] to gain Authorization. [facility] on call nurse states that she would prefer a discharge date before they provided an authorization number. LCSW called patient's room and provided update. He was informed of the 3 facilities that have accepted him for SNF placement thus far. Patient unable to give decision on chosen SNF bed at this time and prefers to wait another day before making final decision.   Expected Discharge Plan: [place] Barriers to Discharge: No Barriers Identified  Expected Discharge Plan and Services Expected Discharge Plan: [place] In-house Referral: Clinical Social Work     Living arrangements for the past 2 months: Single Family Home Expected Discharge Date: 02/26/19                 Readmission Risk Interventions No flowsheet data found.

## 2019-03-03 NOTE — Plan of Care (Signed)
Patient very confused and demanding to get up and into the shower.  Refuses pain medicine.

## 2019-03-03 NOTE — Progress Notes (Signed)
PROGRESS NOTE  VAIDEN [name] [id number] DOB: Mar 03, 1941 DOA: 02/26/2019 PCP: [name], MD  Brief History    Pt was recently hospitalized from 6/5-6/8 for left knee replacement on 6/5. He was just discharged to rehab facility yesterday. He states that he fell accidentally when he tried to turn the light on in facility, injured his right hip, causing severe pain.  The pain is constant, 10 out of 10 severity, sharp, nonradiating. He denies loss of consciousness.  No head or neck injury.  Patient denies chest pain, shortness breath, cough, fever or chills.  No nausea vomiting, diarrhea, abdominal pain, symptoms of UTI or unilateral weakness. He states that he started taking Xarelto yesterday, but does not remember at what time he took it.  ED Course: pt was found to have WBC 10.6, negative COVID-19 test, electrolytes renal function okay, bradycardia with heart rate 41-45, no tachypnea, oxygen saturation 93% on room air, temperature 99.7.  Chest x-ray negative.  X-ray of left knee showed postsurgical change without acute issues.  CT head is negative for acute intracranial abnormalities.  CT of C-spine is negative for acute bony fracture. Patient is admitted to telemetry bed as inpatient. Dr. Veverly Fells of ortho was consulted.   # X-ray of her right femur showed: Acute displaced intratrochanteric fracture of the proximal right femur.  Orthopedic surgery was consulted. The patient went for IM Nailing of right proximal femur on 02/27/2019.  Consultants  . Orthopedic surgery  Procedures  . IM Nailing of right proximal femur on 02/27/2019.  Antibiotics  . Peri-operative Ancef  Subjective  Today the patient is resting in bed. He is frustrated with the pace of his therapy.  Objective   Vitals:  Vitals:   03/02/19 2122 03/03/19 1324  BP: (!) 157/70 113/67  Pulse: 72 81  Resp: 16 15  Temp: 99.8 F (37.7 C) 99.5 F (37.5 C)  SpO2: 98% (!) 87%    Exam:  Constitutional:  . He is  awake, alert, and oriented x 3. No acute distress. Respiratory:  . No increased work of breathing.  . No wheezes, rales, or rhonchi . No tactile fremitus Cardiovascular:  . Regular rate and rhythm. . No murmurs, ectopy, or gallups . No LE extremity edema   . Normal pedal pulses Abdomen:  . Abdomen is soft, non-tender, non-distended . No hernias, masses, or organomegaly is appreciated . Normoactive bowel sounds.  Musculoskeletal:  . Digits/nails BUE: no clubbing, cyanosis, petechiae, infection Skin:  . No rashes, lesions, ulcers . palpation of skin: no induration or nodules Neurologic:  . CN 2-12 intact . Sensation all 4 extremities intact Psychiatric:  . Mental status o Mood, affect appropriate o Orientation to person, place, time  . judgment and insight appear intact   I have personally reviewed the following:   Today's Data  . CBC, BMP, Glucose, Vitals  Scheduled Meds: . amLODipine  10 mg Oral Daily  . buPROPion  150 mg Oral BID  . carvedilol  3.125 mg Oral BID WC  . cycloSPORINE  1 drop Both Eyes BID  . docusate sodium  100 mg Oral BID  . dutasteride  0.5 mg Oral Daily  . lactulose  20 g Oral TID  . mirtazapine  15 mg Oral QHS  . multivitamin with minerals  1 tablet Oral Daily  . pantoprazole  40 mg Oral Daily  . rivaroxaban  20 mg Oral Q breakfast  . timolol  1 drop Both Eyes BID   Continuous Infusions:  Principal Problem:   Closed displaced intertrochanteric fracture of right femur ([place]) Active Problems:   Essential hypertension   GERD   BPH (benign prostatic hyperplasia)   Atrial fibrillation (HCC)   MDD (major depressive disorder), recurrent, in partial remission ([place])   Benzodiazepine dependence ([place])   Status post total knee replacement, left   Fall   Stroke [facility])   Femur fracture, right ([place])   Closed comminuted intertrochanteric fracture of proximal end of right femur ([place])   LOS: 5 days    A & P   Closed displaced intertrochanteric  fracture right femur: Following a mechanical fall .Othopedics following.  Pt underwent IM nailing of Right proximal femur on 02/27/2019. He has done well, except for spasms in the right thigh.  PT/OTfollowing. Continue pain management. Plan is for discharge to SNF tomorrow pending bed availability.  Status post total knee replacement of the left: Orthopedics following. Continue pain management. Has a brace on place. Ancipate need for rehab on discharge.  Paroxysmal A. Fib: CHA2DS2-VASc Scoreis 5.  On Xarelto at home.  Heart rate is well controlled.  Continue Coreg.  Xarelto held for surgery, now resumed at therapeuitic dose.  Constipation: BM on 03/01/2019. Continue daily miralax.  Hypertension: Continue current medications.  AM blood pressures improved with addition of lisinopril  GERD: Continue Protonix  BPH: On dutasteride  Depressive disorder: On remeron, bupropion, Xanax.  I have seen and examined this patient myself. I have spent 30 minutes in his evaluation and care.  DVT prophylaxis: Xarelto restarted Code Status: Full Family Communication: None present at the bedside Disposition Plan: Skilled nursing facility after surgery, PT evaluation.  Alexx Mcburney, DO [facility] Direct contact: see [email or web address]  7PM-7AM contact night coverage as above

## 2019-03-03 NOTE — Progress Notes (Signed)
Subjective: 4 Days Post-Op Procedure(s) (LRB): INTRAMEDULLARY (IM) NAIL FEMORAL (Right) Patient reports pain as mild in his left knee and right hip. No acute events overnight. Patient reports that he has felt confused at times, but is alert and oriented this morning. Voiding without difficulty, but endorses trouble using urinal in the bed. Positive bowel movement yesterday. Patient denies dizziness, CP, SHOB, calf pain. Patient reports frustration that he has not been as active as he wishes to be with therapy.   Objective: Vital signs in last 24 hours: Temp:  [98.4 F (36.9 C)-99.8 F (37.7 C)] 99.8 F (37.7 C) (06/13 2122) Pulse Rate:  [44-72] 72 (06/13 2122) Resp:  [15-16] 16 (06/13 2122) BP: (147-157)/(61-70) 157/70 (06/13 2122) SpO2:  [98 %-99 %] 98 % (06/13 2122)  Intake/Output from previous day: 06/13 0701 - 06/14 0700 In: 840 [P.O.:840] Out: 850 [Urine:850] Intake/Output this shift: Total I/O In: -  Out: 400 [Urine:400]  Recent Labs    03/01/19 0756 03/02/19 0251  HGB 9.6* 8.8*   Recent Labs    03/01/19 0756 03/02/19 0251  WBC 11.6* 10.0  RBC 3.06* 2.79*  HCT 28.9* 26.8*  PLT 238 243   Recent Labs    03/01/19 0756  NA 140  K 3.5  CL 106  CO2 24  BUN 30*  CREATININE 0.82  GLUCOSE 123*  CALCIUM 8.3*   No results for input(s): LABPT, INR in the last 72 hours.  Neurologically intact Sensation intact distally Intact pulses distally Dorsiflexion/Plantar flexion intact Dressings on the left knee and right hip C/D/I.  Assessment/Plan: 4 Days Post-Op Procedure(s) (LRB): INTRAMEDULLARY (IM) NAIL FEMORAL (Right)  Plan for discharge to SNF pending placement. Likely discharge tomorrow. Hemoglobin 8.8, not symptomatic. Continue working with PT. WBAT to bilateral LE.     Griffith Citron, PA-C Orthopedic Surgery [facility]

## 2019-03-04 DIAGNOSIS — I639 Cerebral infarction, unspecified: Secondary | ICD-10-CM

## 2019-03-04 LAB — GLUCOSE, CAPILLARY
Glucose-Capillary: 100 mg/dL — ABNORMAL HIGH (ref 70–99)
Glucose-Capillary: 120 mg/dL — ABNORMAL HIGH (ref 70–99)

## 2019-03-04 NOTE — TOC Progression Note (Addendum)
Transition of Care [facility]) - Progression Note    Patient Details  Name: Jared [name] MRN: [id number] Date of Birth: 17-Jun-1941  Transition of Care [facility]) CM/SW Contact  Leeroy Cha, RN Phone Number: 03/04/2019, 9:49 AM  Clinical Narrative:    [facility] [phone], will accept patient.  Once authorziation is complete and COVID test within the laST 3 DAys is done. [facility]/per Crystal/Medical Director has denied based on the fact that is unable to tell wheter or not patient will be able to participate in therapies.   tct-office-mesaage left for Dr. Veverly Fells or pa to return call/ tcf0 Oris Drone the daughter/informed of choice and availability and that we are awaiting authorization.  Message lsent to Dr. Angelina Sheriff about medical review for authorization to call. Dr. Stann Ore at [phone]. aT [phone]. 1537-Stephanie with [employer] patient has approved, spoke with Patient [facility] will not accept, would like to go to [facility]. Expected Discharge Plan: [place] Barriers to Discharge: No Barriers Identified  Expected Discharge Plan and Services Expected Discharge Plan: [place] In-house Referral: Clinical Social Work     Living arrangements for the past 2 months: Single Family Home Expected Discharge Date: 02/26/19                                     Social Determinants of Health (SDOH) Interventions    Readmission Risk Interventions No flowsheet data found.

## 2019-03-04 NOTE — Progress Notes (Signed)
PROGRESS NOTE    Jared [name]  [id number] DOB: 1941/04/12 DOA: 02/26/2019 PCP: [name], MD    Brief Narrative:  78 year old male who presented with right hip pain after a mechanical fall.  He does have a significant past medical history for hypertension, CVA, GERD, depression, anxiety, paroxysmal atrial fibrillation and obstructive sleep apnea.  Recently hospitalized June 5 to June 8 for a left knee replacement.  Discharged to skilled nursing facility for rehabilitation.  He sustained a mechanical fall, without head trauma loss of consciousness, injuring his right hip, and developing significant sharp pain.  Initial physical examination blood pressure 158/79, heart rate 82, respirate 18, temperature 97.6 oxygen saturation 95%, his lungs were clear to auscultation bilaterally, heart S1-S2 present and rhythmic, abdomen was soft nontender, no lower extremity edema, he had tenderness on his right hip, right leg was shortened and externally rotated.  Right femur x-ray showing acute displaced intratrochanteric fracture of the proximal right femur. EKG 89 bpm with left axis deviation and left anterior fascicular block, sinus rhythm with PAC with normal and aberrant conduction, no ST segment or T wave abnormalities.   Patient was admitted to the hospital with the working diagnosis of acute closed displaced intratrochanteric fracture of the right femur  Assessment & Plan:   Principal Problem:   Closed displaced intertrochanteric fracture of right femur ([place]) Active Problems:   Essential hypertension   GERD   BPH (benign prostatic hyperplasia)   Atrial fibrillation (HCC)   MDD (major depressive disorder), recurrent, in partial remission ([place])   Benzodiazepine dependence ([place])   Status post total knee replacement, left   Fall   Stroke [facility])   Femur fracture, right ([place])   Closed comminuted intertrochanteric fracture of proximal end of right femur ([place])   1. Acute closed displaced  intra-trochanteric right femur fracture. Patient continue to have pain, requiring analgesics, continue to have decreased mobility, will continue physical therapy recommendations and dvt prophylaxis. Plan for SNF pending authorization. I spoke with Dr. Amalia Hailey from insurance authorization, will need more information from PT.   2. HTN. Continue blood pressure monitoring, continue blood pressure control with amlodipine and carvedilol,   3. Paroxysmal atrial fibrillation. Rate controlled, continue anticoagulation with rivaroxaban.   4. Hx of CVA. No neurological focal deficit.   DVT prophylaxis: rivaroxaban   Code Status: full Family Communication: no family at the bedside  Disposition Plan/ discharge barriers: pending insurance authorization for snf.   Body mass index is 29.29 kg/m. Malnutrition Type:      Malnutrition Characteristics:      Nutrition Interventions:     RN Pressure Injury Documentation:     Consultants:   Orthopedics  Procedures:  Intramedullary fixation, Right femur  Antimicrobials:       Subjective: Pain is controlled with analgesics and has been ambulating with help of physical therapy, no nausea or vomiting, no chest pain or dyspnea.   Objective: Vitals:   03/02/19 2122 03/03/19 1324 03/03/19 2205 03/04/19 0529  BP: (!) 157/70 113/67 (!) 146/59 (!) 158/71  Pulse: 72 81 69 69  Resp: 16 15 17 17   Temp: 99.8 F (37.7 C) 99.5 F (37.5 C) 98.5 F (36.9 C) 98.7 F (37.1 C)  TempSrc: Oral  Oral Oral  SpO2: 98% (!) 87% 97% 96%  Weight:      Height:        Intake/Output Summary (Last 24 hours) at 03/04/2019 1025 Last data filed at 03/04/2019 0905 Gross per 24 hour  Intake 1220  ml  Output 1105 ml  Net 115 ml   Filed Weights   02/27/19 0700  Weight: 92.6 kg    Examination:   General: deconditioned  Neurology: Awake and alert, non focal  E ENT: mild pallor, no icterus, oral mucosa moist Cardiovascular: No JVD. S1-S2 present, rhythmic,  no gallops, rubs, or murmurs. No lower extremity edema. Pulmonary: positive breath sounds bilaterally, adequate air movement, no wheezing, rhonchi or rales. Gastrointestinal. Abdomen with no organomegaly, non tender, no rebound or guarding Skin. No rashes Musculoskeletal: no joint deformities     Data Reviewed: I have personally reviewed following labs and imaging studies  CBC: Recent Labs  Lab 02/26/19 0040 02/26/19 0441 02/28/19 0507 03/01/19 0756 03/02/19 0251  WBC 10.6* 11.2* 10.2 11.6* 10.0  NEUTROABS 7.6  --   --   --   --   HGB 12.3* 11.6* 9.6* 9.6* 8.8*  HCT 36.3* 33.4* 29.6* 28.9* 26.8*  MCV 91.2 89.8 94.3 94.4 96.1  PLT 201 191 202 238 127   Basic Metabolic Panel: Recent Labs  Lab 02/26/19 0040 02/26/19 0441 02/28/19 0507 03/01/19 0756  NA 137 139 140 140  K 3.8 4.2 4.1 3.5  CL 104 105 108 106  CO2 23 26 24 24   GLUCOSE 149* 143* 162* 123*  BUN 25* 24* 31* 30*  CREATININE 1.02 1.06 0.86 0.82  CALCIUM 8.8* 8.8* 8.7* 8.3*   GFR: Estimated Creatinine Clearance: 87.6 mL/min (by C-G formula based on SCr of 0.82 mg/dL). Liver Function Tests: No results for input(s): AST, ALT, ALKPHOS, BILITOT, PROT, ALBUMIN in the last 168 hours. No results for input(s): LIPASE, AMYLASE in the last 168 hours. No results for input(s): AMMONIA in the last 168 hours. Coagulation Profile: Recent Labs  Lab 02/26/19 0441  INR 2.0*   Cardiac Enzymes: No results for input(s): CKTOTAL, CKMB, CKMBINDEX, TROPONINI in the last 168 hours. BNP (last 3 results) No results for input(s): PROBNP in the last 8760 hours. HbA1C: No results for input(s): HGBA1C in the last 72 hours. CBG: Recent Labs  Lab 03/04/19 0719  GLUCAP 100*   Lipid Profile: No results for input(s): CHOL, HDL, LDLCALC, TRIG, CHOLHDL, LDLDIRECT in the last 72 hours. Thyroid Function Tests: No results for input(s): TSH, T4TOTAL, FREET4, T3FREE, THYROIDAB in the last 72 hours. Anemia Panel: No results for  input(s): VITAMINB12, FOLATE, FERRITIN, TIBC, IRON, RETICCTPCT in the last 72 hours.    Radiology Studies: I have reviewed all of the imaging during this hospital visit personally     Scheduled Meds: . amLODipine  10 mg Oral Daily  . buPROPion  150 mg Oral BID  . carvedilol  3.125 mg Oral BID WC  . cycloSPORINE  1 drop Both Eyes BID  . docusate sodium  100 mg Oral BID  . dutasteride  0.5 mg Oral Daily  . lactulose  20 g Oral TID  . mirtazapine  15 mg Oral QHS  . multivitamin with minerals  1 tablet Oral Daily  . pantoprazole  40 mg Oral Daily  . rivaroxaban  20 mg Oral Q breakfast  . timolol  1 drop Both Eyes BID   Continuous Infusions:   LOS: 6 days        Aryahi Denzler Gerome Apley, MD

## 2019-03-04 NOTE — Discharge Instructions (Signed)
Dr. Rod Can Adult Hip & Knee Specialist [facility] [address], [place], [place] [address] [phone]   POSTOPERATIVE DIRECTIONS    Hip Rehabilitation, Guidelines Following Surgery   WEIGHT BEARING Weight bearing as tolerated with assist device (walker, cane, etc) as directed, use it as long as suggested by your surgeon or therapist, typically at least 4-6 weeks.   HOME CARE INSTRUCTIONS  Remove items at home which could result in a fall. This includes throw rugs or furniture in walking pathways.  Continue medications as instructed at time of discharge.  You may have some home medications which will be placed on hold until you complete the course of blood thinner medication.  4 days after discharge, you may start showering. No tub baths or soaking your incisions. Do not put on socks or shoes without following the instructions of your caregivers.   Sit on chairs with arms. Use the chair arms to help push yourself up when arising.  Arrange for the use of a toilet seat elevator so you are not sitting low.   Walk with walker as instructed.  You may resume a sexual relationship in one month or when given the OK by your caregiver.  Use walker as long as suggested by your caregivers.  Avoid periods of inactivity such as sitting longer than an hour when not asleep. This helps prevent blood clots.  You may return to work once you are cleared by [employer], [employer].  Do not drive a car for 6 weeks or until released by your surgeon.  Do not drive while taking narcotics.  Wear elastic stockings for two weeks following surgery during the day but you may remove then at night.  Make sure you keep all of your appointments after your operation with all of your doctors and caregivers. You should call the office at the above phone number and make an appointment for approximately two weeks after the date of your surgery. Please pick up a stool softener and laxative  for home use as long as you are requiring pain medications.  ICE to the affected hip every three hours for 30 minutes at a time and then as needed for pain and swelling. Continue to use ice on the hip for pain and swelling from surgery. You may notice swelling that will progress down to the foot and ankle.  This is normal after surgery.  Elevate the leg when you are not up walking on it.   It is important for you to complete the blood thinner medication as prescribed by your doctor.  Continue to use the breathing machine which will help keep your temperature down.  It is common for your temperature to cycle up and down following surgery, especially at night when you are not up moving around and exerting yourself.  The breathing machine keeps your lungs expanded and your temperature down.  RANGE OF MOTION AND STRENGTHENING EXERCISES  These exercises are designed to help you keep full movement of your hip joint. Follow your caregiver's or physical therapist's instructions. Perform all exercises about fifteen times, three times per day or as directed. Exercise both hips, even if you have had only one joint replacement. These exercises can be done on a training (exercise) mat, on the floor, on a table or on a bed. Use whatever works the best and is most comfortable for you. Use music or television while you are exercising so that the exercises are a pleasant break in your day. This  will make your life better with the exercises acting as a break in routine you can look forward to.  Lying on your back, slowly slide your foot toward your buttocks, raising your knee up off the floor. Then slowly slide your foot back down until your leg is straight again.  Lying on your back spread your legs as far apart as you can without causing discomfort.  Lying on your side, raise your upper leg and foot straight up from the floor as far as is comfortable. Slowly lower the leg and repeat.  Lying on your back, tighten up the  muscle in the front of your thigh (quadriceps muscles). You can do this by keeping your leg straight and trying to raise your heel off the floor. This helps strengthen the largest muscle supporting your knee.  Lying on your back, tighten up the muscles of your buttocks both with the legs straight and with the knee bent at a comfortable angle while keeping your heel on the floor.   SKILLED REHAB INSTRUCTIONS: If the patient is transferred to a skilled rehab facility following release from the hospital, a list of the current medications will be sent to the facility for the patient to continue.  When discharged from the skilled rehab facility, please have the facility set up the patient's [place] prior to being released. Also, the skilled facility will be responsible for providing the patient with their medications at time of release from the facility to include their pain medication and their blood thinner medication. If the patient is still at the rehab facility at time of the two week follow up appointment, the skilled rehab facility will also need to assist the patient in arranging follow up appointment in our office and any transportation needs.  MAKE SURE YOU:  Understand these instructions.  Will watch your condition.  Will get help right away if you are not doing well or get worse.  Pick up stool softner and laxative for home use following surgery while on pain medications. Daily dry dressing changes as needed. In 4 days, you may remove your dressings and begin taking showers - no tub baths or soaking the incisions. Continue to use ice for pain and swelling after surgery. Do not use any lotions or creams on the incision until instructed by your surgeon.  Information on my medicine - XARELTO (Rivaroxaban)  This medication education was reviewed with me or my healthcare representative as part of my discharge preparation.  The pharmacist that spoke with me during my  hospital stay was:  Onnie Boer, RPH-CPP  Why was Xarelto prescribed for you? Xarelto was prescribed for you to reduce the risk of a blood clot forming that can cause a stroke if you have a medical condition called atrial fibrillation (a type of irregular heartbeat).  What do you need to know about xarelto ? Take your Xarelto ONCE DAILY at the same time every day with your evening meal. If you have difficulty swallowing the tablet whole, you may crush it and mix in applesauce just prior to taking your dose.  Take Xarelto exactly as prescribed by your doctor and DO NOT stop taking Xarelto without talking to the doctor who prescribed the medication.  Stopping without other stroke prevention medication to take the place of Xarelto may increase your risk of developing a clot that causes a stroke.  Refill your prescription before you run out.  After discharge, you should have regular check-up appointments with your healthcare  provider that is prescribing your Xarelto.  In the future your dose may need to be changed if your kidney function or weight changes by a significant amount.  What do you do if you miss a dose? If you are taking Xarelto ONCE DAILY and you miss a dose, take it as soon as you remember on the same day then continue your regularly scheduled once daily regimen the next day. Do not take two doses of Xarelto at the same time or on the same day.   Important Safety Information A possible side effect of Xarelto is bleeding. You should call your healthcare provider right away if you experience any of the following: ? Bleeding from an injury or your nose that does not stop. ? Unusual colored urine (red or dark brown) or unusual colored stools (red or black). ? Unusual bruising for unknown reasons. ? A serious fall or if you hit your head (even if there is no bleeding).  Some medicines may interact with Xarelto and might increase your risk of bleeding while on Xarelto. To help  avoid this, consult your healthcare provider or pharmacist prior to using any new prescription or non-prescription medications, including herbals, vitamins, non-steroidal anti-inflammatory drugs (NSAIDs) and supplements.  This website has more information on Xarelto: [email or web address]-benson.[email or web address]/.  Left knee weight bearing as tolerated.  DO NOT prop anything under the knee.  Do exercises every hour including bending and straightening exercises.

## 2019-03-04 NOTE — Plan of Care (Signed)
Patient still moving with assist of 3 staff and gait belt.  Pain does seem to be better under control.

## 2019-03-04 NOTE — Progress Notes (Signed)
Physical Therapy Treatment Patient Details Name: Jared [name] MRN: [id number] DOB: Jul 01, 1941 Today's Date: 03/04/2019    History of Present Illness Pt is a 78 y.o. male with recent L TKA (02/22/19) with d/c to SNF, pt with fall at SNF with R intertrochateric femur fracture. Pt s/p R IM nail of femur on 02/27/19. PMH includes HTN, PAF, stroke, OA, anxiety.    PT Comments    Pt showed progression of mobility this session. Pt with improved L knee AAROM 5-90*, and improved tolerance for mobility this session. Pt ambulated 25 ft with RW with mod assist for steadying, RW management. Pt very motivated to progress mobility, PT administered and reviewed pt exs handout for pt to perform LLE and RLE exercises on his own. PT to  Continue to follow acutely.    Follow Up Recommendations  SNF     Equipment Recommendations  Other (comment)(defer)    Recommendations for Other Services       Precautions / Restrictions Precautions Precautions: Fall Restrictions RLE Weight Bearing: Weight bearing as tolerated Other Position/Activity Restrictions: LLE,RLE WBAT    Mobility  Bed Mobility Overal bed mobility: Needs Assistance Bed Mobility: Supine to Sit     Supine to sit: Mod assist;HOB elevated     General bed mobility comments: Mod assist for LE management, pt elevated trunk with use of handrails.  Transfers Overall transfer level: Needs assistance Equipment used: Rolling walker (2 wheeled) Transfers: Sit to/from Stand Sit to Stand: Mod assist;+2 physical assistance;From elevated surface;+2 safety/equipment         General transfer comment: Mod assist +2 for power up, anterior translation of trunk, steadying, placement of bilateral LEs within BOS. Increased time to steady  Ambulation/Gait Ambulation/Gait assistance: Mod assist;+2 safety/equipment Gait Distance (Feet): 25 Feet Assistive device: Rolling walker (2 wheeled)(chair follow) Gait Pattern/deviations: Trunk  flexed;Antalgic;Step-through pattern;Decreased stride length Gait velocity: decr   General Gait Details: Mod assist for steadying, forward progression of RW, sequencing assist. VC for placement in RW. Pt reported feeling lightheaded, BP and HR 126/66 and 75 bpm post-ambulation.   Stairs             Wheelchair Mobility    Modified Rankin (Stroke Patients Only)       Balance Overall balance assessment: Needs assistance;History of Falls Sitting-balance support: Bilateral upper extremity supported;Feet supported Sitting balance-Leahy Scale: Fair     Standing balance support: Bilateral upper extremity supported;During functional activity Standing balance-Leahy Scale: Poor Standing balance comment: reliant on PT and RW for steadying in standing, both statically and dynamically                            Cognition Arousal/Alertness: Awake/alert Behavior During Therapy: WFL for tasks assessed/performed Overall Cognitive Status: Within Functional Limits for tasks assessed                                        Exercises Total Joint Exercises Quad Sets: AROM;10 reps;Seated;Left;Other (comment)(with towel under heel) Short Arc Quad: AAROM;Left;10 reps;Seated Heel Slides: AAROM;15 reps;Left;Seated;Other (comment)(with towel under ankle) Hip ABduction/ADduction: AROM;Both;10 reps;Seated Long Arc Quad: AAROM;Right;10 reps;Seated Goniometric ROM: knee L AAROM 5-90* limited by pain    General Comments        Pertinent Vitals/Pain Pain Assessment: Faces Faces Pain Scale: Hurts little more Pain Location: R hip and L knee soreness Pain Descriptors / Indicators:  Sore Pain Intervention(s): Limited activity within patient's tolerance;Monitored during session;Ice applied;Repositioned    Home Living                      Prior Function            PT Goals (current goals can now be found in the care plan section) Acute Rehab PT  Goals Patient Stated Goal: recover PT Goal Formulation: With patient Time For Goal Achievement: 03/14/19 Potential to Achieve Goals: Good Progress towards PT goals: Progressing toward goals    Frequency    Min 4X/week      PT Plan Current plan remains appropriate    Co-evaluation              AM-PAC PT "6 Clicks" Mobility   Outcome Measure  Help needed turning from your back to your side while in a flat bed without using bedrails?: A Lot Help needed moving from lying on your back to sitting on the side of a flat bed without using bedrails?: A Lot Help needed moving to and from a bed to a chair (including a wheelchair)?: A Lot Help needed standing up from a chair using your arms (e.g., wheelchair or bedside chair)?: A Lot Help needed to walk in hospital room?: A Lot Help needed climbing 3-5 steps with a railing? : A Lot 6 Click Score: 12    End of Session Equipment Utilized During Treatment: Gait belt Activity Tolerance: Patient limited by fatigue;Patient limited by pain Patient left: in chair;with SCD's reapplied Nurse Communication: Mobility status PT Visit Diagnosis: Other abnormalities of gait and mobility (R26.89);History of falling (Z91.81);Unsteadiness on feet (R26.81)     Time: 3159-4585 PT Time Calculation (min) (ACUTE ONLY): 26 min  Charges:  $Gait Training: 8-22 mins $Therapeutic Exercise: 8-22 mins                    Julien Girt, PT Acute Rehabilitation Services Pager [phone]  Office [phone]   Jared [name] 03/04/2019, 2:10 PM

## 2019-03-04 NOTE — Care Management Important Message (Signed)
Important Message  Patient Details IM letter given to Velva Harman RN to present to the Patient Name: Jared [name] MRN: [id number] Date of Birth: 1940-11-09   Medicare Important Message Given:  Yes    Kerin Salen 03/04/2019, 1:04 PM

## 2019-03-04 NOTE — Progress Notes (Signed)
Orthopedics Progress Note  Subjective: Patient feeling better today.  He said that his PT went well today.   Objective:  Vitals:   03/04/19 0529 03/04/19 1317  BP: (!) 158/71 116/72  Pulse: 69 68  Resp: 17 18  Temp: 98.7 F (37.1 C) 99.2 F (37.3 C)  SpO2: 96% 99%    General: Awake and alert  Musculoskeletal: Left knee and right hip wounds CDI, dressings in place Neurovascularly intact  Lab Results  Component Value Date   WBC 10.0 03/02/2019   HGB 8.8 (L) 03/02/2019   HCT 26.8 (L) 03/02/2019   MCV 96.1 03/02/2019   PLT 243 03/02/2019       Component Value Date/Time   NA 140 03/01/2019 0756   K 3.5 03/01/2019 0756   CL 106 03/01/2019 0756   CO2 24 03/01/2019 0756   GLUCOSE 123 (H) 03/01/2019 0756   BUN 30 (H) 03/01/2019 0756   CREATININE 0.82 03/01/2019 0756   CALCIUM 8.3 (L) 03/01/2019 0756   GFRNONAA >60 03/01/2019 0756   GFRAA >60 03/01/2019 0756    Lab Results  Component Value Date   INR 2.0 (H) 02/26/2019   INR 1.03 07/26/2017    Assessment/Plan: S/p Left knee TKR and right hip INTRAMEDULLARY (IM) NAIL FEMORAL Continue PT, OT, OOB, CDVT prophylaxis Discharge planning. He has not had recent sundowning or confusion over the last 36 hours, hopefully he will remain alert and oriented and be ready for SNF transfer and continued rehab  Remo Lipps R. Veverly Fells, MD 03/04/2019 5:22 PM

## 2019-03-05 DIAGNOSIS — Z96652 Presence of left artificial knee joint: Secondary | ICD-10-CM | POA: Diagnosis not present

## 2019-03-05 DIAGNOSIS — M25511 Pain in right shoulder: Secondary | ICD-10-CM | POA: Diagnosis not present

## 2019-03-05 DIAGNOSIS — I639 Cerebral infarction, unspecified: Secondary | ICD-10-CM | POA: Diagnosis not present

## 2019-03-05 DIAGNOSIS — F1320 Sedative, hypnotic or anxiolytic dependence, uncomplicated: Secondary | ICD-10-CM | POA: Diagnosis not present

## 2019-03-05 DIAGNOSIS — N390 Urinary tract infection, site not specified: Secondary | ICD-10-CM | POA: Diagnosis not present

## 2019-03-05 DIAGNOSIS — Z7401 Bed confinement status: Secondary | ICD-10-CM | POA: Diagnosis not present

## 2019-03-05 DIAGNOSIS — R278 Other lack of coordination: Secondary | ICD-10-CM | POA: Diagnosis not present

## 2019-03-05 DIAGNOSIS — M79601 Pain in right arm: Secondary | ICD-10-CM | POA: Diagnosis not present

## 2019-03-05 DIAGNOSIS — F329 Major depressive disorder, single episode, unspecified: Secondary | ICD-10-CM | POA: Diagnosis not present

## 2019-03-05 DIAGNOSIS — G4709 Other insomnia: Secondary | ICD-10-CM | POA: Diagnosis not present

## 2019-03-05 DIAGNOSIS — D649 Anemia, unspecified: Secondary | ICD-10-CM | POA: Diagnosis not present

## 2019-03-05 DIAGNOSIS — M2550 Pain in unspecified joint: Secondary | ICD-10-CM | POA: Diagnosis not present

## 2019-03-05 DIAGNOSIS — R2689 Other abnormalities of gait and mobility: Secondary | ICD-10-CM | POA: Diagnosis not present

## 2019-03-05 DIAGNOSIS — W19XXXA Unspecified fall, initial encounter: Secondary | ICD-10-CM | POA: Diagnosis not present

## 2019-03-05 DIAGNOSIS — K5909 Other constipation: Secondary | ICD-10-CM | POA: Diagnosis not present

## 2019-03-05 DIAGNOSIS — M6281 Muscle weakness (generalized): Secondary | ICD-10-CM | POA: Diagnosis not present

## 2019-03-05 DIAGNOSIS — R5381 Other malaise: Secondary | ICD-10-CM | POA: Diagnosis not present

## 2019-03-05 DIAGNOSIS — E039 Hypothyroidism, unspecified: Secondary | ICD-10-CM | POA: Diagnosis not present

## 2019-03-05 DIAGNOSIS — S72141D Displaced intertrochanteric fracture of right femur, subsequent encounter for closed fracture with routine healing: Secondary | ICD-10-CM | POA: Diagnosis not present

## 2019-03-05 DIAGNOSIS — N401 Enlarged prostate with lower urinary tract symptoms: Secondary | ICD-10-CM | POA: Diagnosis not present

## 2019-03-05 DIAGNOSIS — I480 Paroxysmal atrial fibrillation: Secondary | ICD-10-CM | POA: Diagnosis not present

## 2019-03-05 DIAGNOSIS — A0472 Enterocolitis due to Clostridium difficile, not specified as recurrent: Secondary | ICD-10-CM | POA: Diagnosis not present

## 2019-03-05 DIAGNOSIS — Z79899 Other long term (current) drug therapy: Secondary | ICD-10-CM | POA: Diagnosis not present

## 2019-03-05 DIAGNOSIS — F3341 Major depressive disorder, recurrent, in partial remission: Secondary | ICD-10-CM | POA: Diagnosis not present

## 2019-03-05 DIAGNOSIS — Z8673 Personal history of transient ischemic attack (TIA), and cerebral infarction without residual deficits: Secondary | ICD-10-CM | POA: Diagnosis not present

## 2019-03-05 DIAGNOSIS — S7290XA Unspecified fracture of unspecified femur, initial encounter for closed fracture: Secondary | ICD-10-CM | POA: Diagnosis not present

## 2019-03-05 DIAGNOSIS — Z471 Aftercare following joint replacement surgery: Secondary | ICD-10-CM | POA: Diagnosis not present

## 2019-03-05 DIAGNOSIS — M25662 Stiffness of left knee, not elsewhere classified: Secondary | ICD-10-CM | POA: Diagnosis not present

## 2019-03-05 DIAGNOSIS — M25551 Pain in right hip: Secondary | ICD-10-CM | POA: Diagnosis not present

## 2019-03-05 DIAGNOSIS — S72001D Fracture of unspecified part of neck of right femur, subsequent encounter for closed fracture with routine healing: Secondary | ICD-10-CM | POA: Diagnosis not present

## 2019-03-05 DIAGNOSIS — M1712 Unilateral primary osteoarthritis, left knee: Secondary | ICD-10-CM | POA: Diagnosis not present

## 2019-03-05 DIAGNOSIS — K219 Gastro-esophageal reflux disease without esophagitis: Secondary | ICD-10-CM | POA: Diagnosis not present

## 2019-03-05 DIAGNOSIS — R319 Hematuria, unspecified: Secondary | ICD-10-CM | POA: Diagnosis not present

## 2019-03-05 DIAGNOSIS — Z20828 Contact with and (suspected) exposure to other viral communicable diseases: Secondary | ICD-10-CM | POA: Diagnosis not present

## 2019-03-05 DIAGNOSIS — I4891 Unspecified atrial fibrillation: Secondary | ICD-10-CM | POA: Diagnosis not present

## 2019-03-05 DIAGNOSIS — E559 Vitamin D deficiency, unspecified: Secondary | ICD-10-CM | POA: Diagnosis not present

## 2019-03-05 DIAGNOSIS — E785 Hyperlipidemia, unspecified: Secondary | ICD-10-CM | POA: Diagnosis not present

## 2019-03-05 DIAGNOSIS — G4730 Sleep apnea, unspecified: Secondary | ICD-10-CM | POA: Diagnosis not present

## 2019-03-05 DIAGNOSIS — M79651 Pain in right thigh: Secondary | ICD-10-CM | POA: Diagnosis not present

## 2019-03-05 DIAGNOSIS — I10 Essential (primary) hypertension: Secondary | ICD-10-CM | POA: Diagnosis not present

## 2019-03-05 DIAGNOSIS — F419 Anxiety disorder, unspecified: Secondary | ICD-10-CM | POA: Diagnosis not present

## 2019-03-05 LAB — SARS CORONAVIRUS 2 BY RT PCR (HOSPITAL ORDER, PERFORMED IN ~~LOC~~ HOSPITAL LAB): SARS Coronavirus 2: NEGATIVE

## 2019-03-05 MED ORDER — POLYETHYLENE GLYCOL 3350 17 G PO PACK
17.0000 g | PACK | Freq: Every day | ORAL | Status: DC
  Administered 2019-03-05: 12:00:00 17 g via ORAL
  Filled 2019-03-05: qty 1

## 2019-03-05 MED ORDER — ALPRAZOLAM 1 MG PO TABS: 0.5000 mg | ORAL_TABLET | Freq: Three times a day (TID) | ORAL | 0 refills | Status: DC | PRN

## 2019-03-05 MED ORDER — POLYETHYLENE GLYCOL 3350 17 G PO PACK: 17.0000 g | PACK | Freq: Every day | ORAL | 0 refills | Status: DC

## 2019-03-05 MED ORDER — BISACODYL 10 MG RE SUPP: 10.0000 mg | Freq: Every day | RECTAL | 0 refills | Status: DC | PRN

## 2019-03-05 NOTE — TOC Progression Note (Addendum)
Transition of Care [facility]) - Progression Note    Patient Details  Name: OSCEOLA DEPAZ MRN: [id number] Date of Birth: March 07, 1941  Transition of Care [facility]) CM/SW Contact  Leeroy Cha, RN Phone Number: 03/05/2019, 8:51 AM  Clinical Narrative:    0851/TCF-Jamie at [facility] call after 10 am about possible bed availability. 1000/tcf-Jamie has a bed at [facility] tct-Beth leonard the dtg-will bkle at [place] at 2pm to sign papers. tct-[facility] Jamie-pt has been dcd/ tct-[facility] for auth number message left to call back with auth number. auth number from [facility] is [id number] called to [facility] [place]. ptar called at 1200 for transport at 3pm, packet with script left at nursing station with sec. Expected Discharge Plan: Skilled Nursing Facility Barriers to Discharge: Insurance Authorization  Expected Discharge Plan and Services Expected Discharge Plan: [facility] In-house Referral: Clinical Social Work     Living arrangements for the past 2 months: Single Family Home Expected Discharge Date: 02/26/19                                     Social Determinants of Health (SDOH) Interventions    Readmission Risk Interventions No flowsheet data found.

## 2019-03-05 NOTE — Progress Notes (Signed)
Report was given to Chical, the receiving nurse at [facility]. PTAR is schedule to arrive around 1500.

## 2019-03-05 NOTE — Discharge Summary (Signed)
Physician Discharge Summary  ARIEZ NEILAN FWY:[id number] DOB: [birth year] DOA: 02/26/2019  PCP: [name], MD  Admit date: 02/26/2019 Discharge date: 03/05/2019  Admitted From: SNF Disposition:  SNF  Recommendations for Outpatient Follow-up and new medication changes:  1. Follow up with Dr. Silvio Pate in 7 days.  2. Continue pain control with oxycodone.  3. Added Myralax and bisacodyl for bowel regimen.   Home Health: na   Equipment/Devices: na    Discharge Condition: stable  CODE STATUS: full  Diet recommendation: heart healthy   Brief/Interim Summary: 78 year old male who presented with right hip pain after a mechanical fall.  He does have a significant past medical history for hypertension, CVA, GERD, depression, anxiety, paroxysmal atrial fibrillation and obstructive sleep apnea.  Recently hospitalized June 5 to June 8 for a left knee replacement.  Discharged to skilled nursing facility for rehabilitation, there he sustained a mechanical fall, without head trauma or loss of consciousness, injuring his right hip, and developing significant local sharp pain. On his initial physical examination his blood pressure was 158/79, heart rate 82, respiratory rate 18, temperature 97.6 oxygen saturation 95%, his lungs were clear to auscultation bilaterally, heart S1-S2 present and rhythmic, abdomen was soft nontender, no lower extremity edema, he had tenderness on his right hip, right leg was shortened and externally rotated.  Sodium 137, potassium 3.8, chloride 104, bicarb 23, glucose 149, BUN 25, creatinine 1.0, white count 10.6, hemoglobin 12.3, hematocrit 36.3, platelets 201.  SARS COVID-19 negative.  Head and neck CT with no acute changes.   Right femur x-ray showing acute displaced intratrochanteric fracture of the proximal right femur.  Chest radiograph with no infiltrates, chronic increased lung markings bilaterally.  EKG 89 bpm with left axis deviation and left anterior fascicular block, sinus  rhythm with PAC with normal and aberrant conduction, no ST segment or T wave abnormalities.   Patient was admitted to the hospital with the working diagnosis of acute closed displaced intratrochanteric fracture of the right femur.  1.  Acute closed displaced intratrochanteric right femur fracture.  Patient was admitted to the medical ward, he received analgesics and DVT prophylaxis.  He underwent intramedullary fixation of his right femur, with no major complications.  He had significant ambulatory dysfunction, and he was deemed a good candidate to continue physical therapy at a skilled nursing facility.  Continue pain control with oxycodone, acetaminophen and DVT prophylaxis with rivaroxaban.  2.  Hypertension.  Blood pressure control with carvedilol and amlodipine. Systolic blood pressure 277 to 150 mmHg.  3.  Paroxysmal atrial fibrillation.  To continue rate control with carvedilol and anticoagulation with rivaroxaban.  4.  History of CVA.  No residual neurologic deficit.  5. Depression. Continue bupropion and mirtazapine.   Discharge Diagnoses:  Principal Problem:   Closed displaced intertrochanteric fracture of right femur ([place]) Active Problems:   Essential hypertension   GERD   BPH (benign prostatic hyperplasia)   Atrial fibrillation ([facility])   MDD (major depressive disorder), recurrent, in partial remission ([place])   Benzodiazepine dependence ([place])   Status post total knee replacement, left   Fall   Stroke [facility])   Femur fracture, right ([place])   Closed comminuted intertrochanteric fracture of proximal end of right femur [facility])    Discharge Instructions   Allergies as of 03/05/2019      Reactions   Antihistamines, Diphenhydramine-type    Blood in urine    Aspirin    Blood in urine    Codeine Nausea And Vomiting  Must take with food   Pollen Extract Cough      Medication List    STOP taking these medications   valsartan 80 MG tablet Commonly known as: Diovan      TAKE these medications   acetaminophen 325 MG tablet Commonly known as: TYLENOL Take 325 mg by mouth every 6 (six) hours as needed for moderate pain or headache.   ALPRAZolam 1 MG tablet Commonly known as: XANAX Take 0.5 tablets (0.5 mg total) by mouth 3 (three) times daily as needed for anxiety.   amLODipine 10 MG tablet Commonly known as: NORVASC Take 1 tablet (10 mg total) by mouth daily.   bisacodyl 10 MG suppository Commonly known as: DULCOLAX Place 1 suppository (10 mg total) rectally daily as needed for moderate constipation.   buPROPion 150 MG 12 hr tablet Commonly known as: WELLBUTRIN SR TAKE 1 TABLET BY MOUTH TWICE A DAY   carvedilol 6.25 MG tablet Commonly known as: COREG Take 1 tablet (6.25 mg total) by mouth 2 (two) times daily with a meal.   dutasteride 0.5 MG capsule Commonly known as: Avodart Take 1 capsule (0.5 mg total) by mouth daily.   ICY HOT EX Apply 1 application topically daily as needed (pain).   methocarbamol 500 MG tablet Commonly known as: Robaxin Take 1 tablet (500 mg total) by mouth 3 (three) times daily as needed. What changed: reasons to take this   mirtazapine 15 MG tablet Commonly known as: REMERON TAKE 1 TABLET BY MOUTH EVERYDAY AT BEDTIME What changed: See the new instructions.   multivitamin with minerals Tabs tablet Take 1 tablet by mouth daily.   omeprazole 10 MG capsule Commonly known as: PRILOSEC Take 1 capsule (10 mg total) by mouth daily.   oxyCODONE-acetaminophen 5-325 MG tablet Commonly known as: Percocet Take 1 tablet by mouth every 4 (four) hours as needed for moderate pain or severe pain. What changed: when to take this   polyethylene glycol 17 g packet Commonly known as: MIRALAX / GLYCOLAX Take 17 g by mouth daily.   Restasis 0.05 % ophthalmic emulsion Generic drug: cycloSPORINE Place 1 drop 2 (two) times daily into both eyes.   rivaroxaban 20 MG Tabs tablet Commonly known as: Xarelto Take 1 tablet (20  mg total) by mouth daily with supper.   Systane Balance 0.6 % Soln Generic drug: Propylene Glycol Place 1 drop into both eyes daily as needed (dry eyes).   timolol 0.5 % ophthalmic solution Commonly known as: TIMOPTIC Place 1 drop into both eyes 2 (two) times daily.      Follow-up Information    Swinteck, Aaron Edelman, MD. Schedule an appointment as soon as possible for a visit in 2 weeks.   Specialty: Orthopedic Surgery Why: For wound re-check Contact information: [address] [address] [place] [address] [phone]        [name], MD In 2 weeks.   Specialty: Orthopedic Surgery Why: [phone] Contact information: [address] [place] [address] [phone]          Allergies  Allergen Reactions  . Antihistamines, Diphenhydramine-Type     Blood in urine   . Aspirin     Blood in urine   . Codeine Nausea And Vomiting    Must take with food  . Pollen Extract Cough    Consultations:  Orthopedics   Procedures/Studies: Dg Chest 1 View  Result Date: 02/26/2019 CLINICAL DATA:  Pain status post fall. EXAM: CHEST  1 VIEW COMPARISON:  None. FINDINGS: The heart size is enlarged. The patient is status post placement of a leadless pacemaker. There is no pneumothorax. No large pleural effusion. The lungs are essentially clear. There is no evidence of an acute osseous abnormality. Aortic calcifications are noted. Pleuroparenchymal scarring is noted at the lung apices. IMPRESSION: No active disease. Electronically Signed   By: Constance Holster M.D.   On: 02/26/2019 01:39   Ct Head Wo Contrast  Result Date: 02/26/2019 CLINICAL DATA:  Head trauma. EXAM: CT HEAD WITHOUT CONTRAST CT CERVICAL SPINE WITHOUT CONTRAST TECHNIQUE: Multidetector CT imaging of the head and cervical spine was performed following the standard protocol without intravenous contrast. Multiplanar CT image reconstructions of the cervical spine were also generated. COMPARISON:   None. FINDINGS: CT HEAD FINDINGS Brain: No evidence of acute infarction, hemorrhage, hydrocephalus, extra-axial collection or mass lesion/mass effect. There is symmetric volume loss which is greater than expected for the patient's age. There are chronic microvascular ischemic changes bilaterally. Vascular: No hyperdense vessel or unexpected calcification. Skull: Normal. Negative for fracture or focal lesion. Sinuses/Orbits: There is maxillary mucosal thickening bilaterally. There is ethmoid air cell mucosal thickening bilaterally. The remaining paranasal sinuses and mastoid air cells are essentially clear. The patient is status post bilateral cataract surgery. Other: None. CT CERVICAL SPINE FINDINGS Alignment: Normal. Skull base and vertebrae: No acute fracture. No primary bone lesion or focal pathologic process. Soft tissues and spinal canal: No prevertebral fluid or swelling. No visible canal hematoma. Disc levels: There is mild multilevel disc height loss noted throughout the cervical spine greatest at the C6-C7 and C7-T1 levels. Upper chest: Negative. Other: None IMPRESSION: 1. No acute intracranial abnormality detected. 2. No cervical spine fracture. 3. Moderate volume loss, greater than expected for the patient's age. There are chronic microvascular ischemic changes. Electronically Signed   By: Constance Holster M.D.   On: 02/26/2019 01:33   Ct Cervical Spine Wo Contrast  Result Date: 02/26/2019 CLINICAL DATA:  Head trauma. EXAM: CT HEAD WITHOUT CONTRAST CT CERVICAL SPINE WITHOUT CONTRAST TECHNIQUE: Multidetector CT imaging of the head and cervical spine was performed following the standard protocol without intravenous contrast. Multiplanar CT image reconstructions of the cervical spine were also generated. COMPARISON:  None. FINDINGS: CT HEAD FINDINGS Brain: No evidence of acute infarction, hemorrhage, hydrocephalus, extra-axial collection or mass lesion/mass effect. There is symmetric volume loss which  is greater than expected for the patient's age. There are chronic microvascular ischemic changes bilaterally. Vascular: No hyperdense vessel or unexpected calcification. Skull: Normal. Negative for fracture or focal lesion. Sinuses/Orbits: There is maxillary mucosal thickening bilaterally. There is ethmoid air cell mucosal thickening bilaterally. The remaining paranasal sinuses and mastoid air cells are essentially clear. The patient is status post bilateral cataract surgery. Other: None. CT CERVICAL SPINE FINDINGS Alignment: Normal. Skull base and vertebrae: No acute fracture. No primary bone lesion or focal pathologic process. Soft tissues and spinal canal: No prevertebral fluid or swelling. No visible canal hematoma. Disc levels: There is mild multilevel disc height loss noted throughout the cervical spine greatest at the C6-C7 and C7-T1 levels. Upper chest: Negative. Other: None IMPRESSION: 1. No acute intracranial abnormality detected. 2. No cervical spine fracture. 3. Moderate volume loss, greater than expected for the patient's age. There are chronic microvascular ischemic changes. Electronically Signed   By: Constance Holster M.D.   On: 02/26/2019 01:33   Pelvis Portable  Result Date: 02/27/2019 CLINICAL DATA:  Status post right hip repair. EXAM: PORTABLE PELVIS 1-2 VIEWS COMPARISON:  February 27, 2019 FINDINGS: A gamma nail and rod have been placed across the comminuted displaced intertrochanteric fracture. The inferior pubic ramus remains displaced. Postoperative air seen in the soft tissues. IMPRESSION: Right hip fracture repair as above. Electronically Signed   By: Dorise Bullion III M.D   On: 02/27/2019 19:11   Dg Knee Complete 4 Views Left  Result Date: 02/26/2019 CLINICAL DATA:  Pain status post fall EXAM: LEFT KNEE - COMPLETE 4+ VIEW COMPARISON:  None. FINDINGS: The patient is status post remote total knee arthroplasty on the left. The hardware appears intact. The alignment appears normal. There  is no evidence of a periprosthetic fracture. There appears to be a moderate to large joint effusion. IMPRESSION: 1. No acute displaced fracture or dislocation. 2. Postsurgical changes related to recent left total knee arthroplasty without evidence of a periprosthetic fracture. A large joint effusion is noted which is likely related to the recent surgical intervention. Electronically Signed   By: Constance Holster M.D.   On: 02/26/2019 01:43   Dg C-arm 1-60 Min-no Report  Result Date: 02/27/2019 CLINICAL DATA:  ORIF proximal right femur fracture EXAM: RIGHT FEMUR 2 VIEWS; DG C-ARM 1-60 MIN-NO REPORT COMPARISON:  02/26/2019 right femur radiographs FINDINGS: Fluoroscopy time 0 minutes 53 seconds. Multiple spot fluoroscopic intraoperative right hip radiographs demonstrate transfixation comminuted intertrochanteric right proximal femur fracture by intramedullary rod and interlocking right femoral neck pin and interlocking distal screw. IMPRESSION: Intraoperative fluoroscopic guidance for ORIF proximal right femur fracture. Electronically Signed   By: Ilona Sorrel M.D.   On: 02/27/2019 17:03   Dg Hip Unilat W Or Wo Pelvis 2-3 Views Right  Result Date: 02/26/2019 CLINICAL DATA:  Pain EXAM: DG HIP (WITH OR WITHOUT PELVIS) 2-3V RIGHT COMPARISON:  None. FINDINGS: There is an acute displaced intratrochanteric fracture of the proximal right fume ir. There is a large medially displaced fracture fragment. There is no dislocation. There is osteopenia. There are mild degenerative changes of both femoroacetabular joints. IMPRESSION: Acute comminuted displaced intratrochanteric fracture of the proximal right femur. Electronically Signed   By: Constance Holster M.D.   On: 02/26/2019 01:40   Dg Femur, Min 2 Views Right  Result Date: 02/27/2019 CLINICAL DATA:  ORIF proximal right femur fracture EXAM: RIGHT FEMUR 2 VIEWS; DG C-ARM 1-60 MIN-NO REPORT COMPARISON:  02/26/2019 right femur radiographs FINDINGS: Fluoroscopy time 0  minutes 53 seconds. Multiple spot fluoroscopic intraoperative right hip radiographs demonstrate transfixation comminuted intertrochanteric right proximal femur fracture by intramedullary rod and interlocking right femoral neck pin and interlocking distal screw. IMPRESSION: Intraoperative fluoroscopic guidance for ORIF proximal right femur fracture. Electronically Signed   By: Ilona Sorrel M.D.   On: 02/27/2019 17:03   Dg Femur Min 2 Views Right  Result Date: 02/26/2019 CLINICAL DATA:  Fracture EXAM: RIGHT FEMUR 2 VIEWS COMPARISON:  None. FINDINGS: There is an acute comminuted displaced intratrochanteric fracture of the proximal right femur. There is no dislocation. There are degenerative changes of the right knee. There is no distal femur fracture. IMPRESSION: Acute displaced intratrochanteric fracture of the proximal right femur. Electronically Signed   By: Constance Holster M.D.   On: 02/26/2019 01:44      Procedures: intramedullary fixation of his right femur  Subjective: Patient is feeling better, right hip pain is controlled, continue to be very weak and deconditioned, working well with physical therapy.   Discharge Exam: Vitals:   03/05/19 0508 03/05/19 0842  BP:  132/69  Pulse: 72 72  Resp:    Temp:  SpO2: 97%    Vitals:   03/04/19 2121 03/05/19 0507 03/05/19 0508 03/05/19 0842  BP: (!) 152/75 (!) 151/86  132/69  Pulse: 74 (!) 42 72 72  Resp: 18 18    Temp: 97.9 F (36.6 C) 98.2 F (36.8 C)    TempSrc:      SpO2: 100% 97% 97%   Weight:      Height:        General: Not in pain or dyspnea  Neurology: Awake and alert, non focal  E ENT: no pallor, no icterus, oral mucosa moist Cardiovascular: No JVD. S1-S2 present, rhythmic, no gallops, rubs, or murmurs. No lower extremity edema. Pulmonary: positive breath sounds bilaterally, adequate air movement, no wheezing, rhonchi or rales. Gastrointestinal. Abdomen with no organomegaly, non tender, no rebound or guarding Skin. No  rashes Musculoskeletal: no joint deformities   The results of significant diagnostics from this hospitalization (including imaging, microbiology, ancillary and laboratory) are listed below for reference.     Microbiology: Recent Results (from the past 240 hour(s))  SARS Coronavirus 2 (CEPHEID - Performed in [place] hospital lab), Hosp Order     Status: None   Collection Time: 02/26/19  1:58 AM   Specimen: Nasopharyngeal Swab  Result Value Ref Range Status   SARS Coronavirus 2 NEGATIVE NEGATIVE Final    Comment: (NOTE) If result is NEGATIVE SARS-CoV-2 target nucleic acids are NOT DETECTED. The SARS-CoV-2 RNA is generally detectable in upper and lower  respiratory specimens during the acute phase of infection. The lowest  concentration of SARS-CoV-2 viral copies this assay can detect is 250  copies / mL. A negative result does not preclude SARS-CoV-2 infection  and should not be used as the sole basis for treatment or other  patient management decisions.  A negative result may occur with  improper specimen collection / handling, submission of specimen other  than nasopharyngeal swab, presence of viral mutation(s) within the  areas targeted by this assay, and inadequate number of viral copies  (<250 copies / mL). A negative result must be combined with clinical  observations, patient history, and epidemiological information. If result is POSITIVE SARS-CoV-2 target nucleic acids are DETECTED. The SARS-CoV-2 RNA is generally detectable in upper and lower  respiratory specimens dur ing the acute phase of infection.  Positive  results are indicative of active infection with SARS-CoV-2.  Clinical  correlation with patient history and other diagnostic information is  necessary to determine patient infection status.  Positive results do  not rule out bacterial infection or co-infection with other viruses. If result is PRESUMPTIVE POSTIVE SARS-CoV-2 nucleic acids MAY BE PRESENT.   A  presumptive positive result was obtained on the submitted specimen  and confirmed on repeat testing.  While 2019 novel coronavirus  (SARS-CoV-2) nucleic acids may be present in the submitted sample  additional confirmatory testing may be necessary for epidemiological  and / or clinical management purposes  to differentiate between  SARS-CoV-2 and other Sarbecovirus currently known to infect humans.  If clinically indicated additional testing with an alternate test  methodology [phone]) is advised. The SARS-CoV-2 RNA is generally  detectable in upper and lower respiratory sp ecimens during the acute  phase of infection. The expected result is Negative. Fact Sheet for Patients:  StrictlyIdeas.no Fact Sheet for Healthcare Providers: BankingDealers.co.za This test is not yet approved or cleared by the [place] FDA and has been authorized for detection and/or diagnosis of SARS-CoV-2 by FDA under an Emergency Use Authorization (EUA).  This EUA will remain in effect (meaning this test can be used) for the duration of the COVID-19 declaration under Section 564(b)(1) of the Act, 21 U.S.C. section 360bbb-3(b)(1), unless the authorization is terminated or revoked sooner. Performed at [facility], [place] [address] [place], [place] [address]   Surgical pcr screen     Status: None   Collection Time: 02/27/19 10:44 AM   Specimen: Nasal Mucosa; Nasal Swab  Result Value Ref Range Status   MRSA, PCR NEGATIVE NEGATIVE Final   Staphylococcus aureus NEGATIVE NEGATIVE Final    Comment: (NOTE) The Xpert SA Assay (FDA approved for NASAL specimens in patients 110 years of age and older), is one component of a comprehensive surveillance program. It is not intended to diagnose infection nor to guide or monitor treatment. Performed at [facility], [place] [address] [place], [place] [address]   SARS Coronavirus 2 (CEPHEID -  Performed in [place] hospital lab), Hosp Order     Status: None   Collection Time: 03/05/19  9:49 AM   Specimen: Nasopharyngeal Swab  Result Value Ref Range Status   SARS Coronavirus 2 NEGATIVE NEGATIVE Final    Comment: (NOTE) If result is NEGATIVE SARS-CoV-2 target nucleic acids are NOT DETECTED. The SARS-CoV-2 RNA is generally detectable in upper and lower  respiratory specimens during the acute phase of infection. The lowest  concentration of SARS-CoV-2 viral copies this assay can detect is 250  copies / mL. A negative result does not preclude SARS-CoV-2 infection  and should not be used as the sole basis for treatment or other  patient management decisions.  A negative result may occur with  improper specimen collection / handling, submission of specimen other  than nasopharyngeal swab, presence of viral mutation(s) within the  areas targeted by this assay, and inadequate number of viral copies  (<250 copies / mL). A negative result must be combined with clinical  observations, patient history, and epidemiological information. If result is POSITIVE SARS-CoV-2 target nucleic acids are DETECTED. The SARS-CoV-2 RNA is generally detectable in upper and lower  respiratory specimens dur ing the acute phase of infection.  Positive  results are indicative of active infection with SARS-CoV-2.  Clinical  correlation with patient history and other diagnostic information is  necessary to determine patient infection status.  Positive results do  not rule out bacterial infection or co-infection with other viruses. If result is PRESUMPTIVE POSTIVE SARS-CoV-2 nucleic acids MAY BE PRESENT.   A presumptive positive result was obtained on the submitted specimen  and confirmed on repeat testing.  While 2019 novel coronavirus  (SARS-CoV-2) nucleic acids may be present in the submitted sample  additional confirmatory testing may be necessary for epidemiological  and / or clinical management  purposes  to differentiate between  SARS-CoV-2 and other Sarbecovirus currently known to infect humans.  If clinically indicated additional testing with an alternate test  methodology [phone]) is advised. The SARS-CoV-2 RNA is generally  detectable in upper and lower respiratory sp ecimens during the acute  phase of infection. The expected result is Negative. Fact Sheet for Patients:  StrictlyIdeas.no Fact Sheet for Healthcare Providers: BankingDealers.co.za This test is not yet approved or cleared by the [place] FDA and has been authorized for detection and/or diagnosis of SARS-CoV-2 by FDA under an Emergency Use Authorization (EUA).  This EUA will remain in effect (meaning this test can be used) for the duration of the COVID-19 declaration under Section 564(b)(1) of the Act, 21  U.S.C. section 360bbb-3(b)(1), unless the authorization is terminated or revoked sooner. Performed at [facility], [place] [address] [place], [place] [address]      Labs: BNP (last 3 results) No results for input(s): BNP in the last 8760 hours. Basic Metabolic Panel: Recent Labs  Lab 02/28/19 0507 03/01/19 0756  NA 140 140  K 4.1 3.5  CL 108 106  CO2 24 24  GLUCOSE 162* 123*  BUN 31* 30*  CREATININE 0.86 0.82  CALCIUM 8.7* 8.3*   Liver Function Tests: No results for input(s): AST, ALT, ALKPHOS, BILITOT, PROT, ALBUMIN in the last 168 hours. No results for input(s): LIPASE, AMYLASE in the last 168 hours. No results for input(s): AMMONIA in the last 168 hours. CBC: Recent Labs  Lab 02/28/19 0507 03/01/19 0756 03/02/19 0251  WBC 10.2 11.6* 10.0  HGB 9.6* 9.6* 8.8*  HCT 29.6* 28.9* 26.8*  MCV 94.3 94.4 96.1  PLT 202 238 243   Cardiac Enzymes: No results for input(s): CKTOTAL, CKMB, CKMBINDEX, TROPONINI in the last 168 hours. BNP: Invalid input(s): POCBNP CBG: Recent Labs  Lab 03/04/19 0719 03/04/19 1119  GLUCAP  100* 120*   D-Dimer No results for input(s): DDIMER in the last 72 hours. Hgb A1c No results for input(s): HGBA1C in the last 72 hours. Lipid Profile No results for input(s): CHOL, HDL, LDLCALC, TRIG, CHOLHDL, LDLDIRECT in the last 72 hours. Thyroid function studies No results for input(s): TSH, T4TOTAL, T3FREE, THYROIDAB in the last 72 hours.  Invalid input(s): FREET3 Anemia work up No results for input(s): VITAMINB12, FOLATE, FERRITIN, TIBC, IRON, RETICCTPCT in the last 72 hours. Urinalysis    Component Value Date/Time   COLORURINE YELLOW 10/29/2018 0932   APPEARANCEUR CLEAR 10/29/2018 0932   LABSPEC 1.015 10/29/2018 0932   PHURINE 7.0 10/29/2018 0932   GLUCOSEU NEGATIVE 10/29/2018 0932   HGBUR SMALL (A) 10/29/2018 0932   BILIRUBINUR NEGATIVE 10/29/2018 0932   BILIRUBINUR 1+ 09/01/2018 0902   KETONESUR NEGATIVE 10/29/2018 0932   PROTEINUR Positive (A) 09/01/2018 0902   PROTEINUR NEGATIVE 02/28/2018 1103   UROBILINOGEN 0.2 10/29/2018 0932   NITRITE NEGATIVE 10/29/2018 0932   LEUKOCYTESUR NEGATIVE 10/29/2018 0932   Sepsis Labs Invalid input(s): PROCALCITONIN,  WBC,  LACTICIDVEN Microbiology Recent Results (from the past 240 hour(s))  SARS Coronavirus 2 (CEPHEID - Performed in [place] hospital lab), Hosp Order     Status: None   Collection Time: 02/26/19  1:58 AM   Specimen: Nasopharyngeal Swab  Result Value Ref Range Status   SARS Coronavirus 2 NEGATIVE NEGATIVE Final    Comment: (NOTE) If result is NEGATIVE SARS-CoV-2 target nucleic acids are NOT DETECTED. The SARS-CoV-2 RNA is generally detectable in upper and lower  respiratory specimens during the acute phase of infection. The lowest  concentration of SARS-CoV-2 viral copies this assay can detect is 250  copies / mL. A negative result does not preclude SARS-CoV-2 infection  and should not be used as the sole basis for treatment or other  patient management decisions.  A negative result may occur with   improper specimen collection / handling, submission of specimen other  than nasopharyngeal swab, presence of viral mutation(s) within the  areas targeted by this assay, and inadequate number of viral copies  (<250 copies / mL). A negative result must be combined with clinical  observations, patient history, and epidemiological information. If result is POSITIVE SARS-CoV-2 target nucleic acids are DETECTED. The SARS-CoV-2 RNA is generally detectable in upper and lower  respiratory specimens  dur ing the acute phase of infection.  Positive  results are indicative of active infection with SARS-CoV-2.  Clinical  correlation with patient history and other diagnostic information is  necessary to determine patient infection status.  Positive results do  not rule out bacterial infection or co-infection with other viruses. If result is PRESUMPTIVE POSTIVE SARS-CoV-2 nucleic acids MAY BE PRESENT.   A presumptive positive result was obtained on the submitted specimen  and confirmed on repeat testing.  While 2019 novel coronavirus  (SARS-CoV-2) nucleic acids may be present in the submitted sample  additional confirmatory testing may be necessary for epidemiological  and / or clinical management purposes  to differentiate between  SARS-CoV-2 and other Sarbecovirus currently known to infect humans.  If clinically indicated additional testing with an alternate test  methodology [phone]) is advised. The SARS-CoV-2 RNA is generally  detectable in upper and lower respiratory sp ecimens during the acute  phase of infection. The expected result is Negative. Fact Sheet for Patients:  StrictlyIdeas.no Fact Sheet for Healthcare Providers: BankingDealers.co.za This test is not yet approved or cleared by the [place] FDA and has been authorized for detection and/or diagnosis of SARS-CoV-2 by FDA under an Emergency Use Authorization (EUA).  This EUA will  remain in effect (meaning this test can be used) for the duration of the COVID-19 declaration under Section 564(b)(1) of the Act, 21 U.S.C. section 360bbb-3(b)(1), unless the authorization is terminated or revoked sooner. Performed at [facility], [place] [address], [place], [place] [address]   Surgical pcr screen     Status: None   Collection Time: 02/27/19 10:44 AM   Specimen: Nasal Mucosa; Nasal Swab  Result Value Ref Range Status   MRSA, PCR NEGATIVE NEGATIVE Final   Staphylococcus aureus NEGATIVE NEGATIVE Final    Comment: (NOTE) The Xpert SA Assay (FDA approved for NASAL specimens in patients 13 years of age and older), is one component of a comprehensive surveillance program. It is not intended to diagnose infection nor to guide or monitor treatment. Performed at [facility], [place] [address], [place], [place] [address]   SARS Coronavirus 2 (CEPHEID - Performed in [place] hospital lab), Hosp Order     Status: None   Collection Time: 03/05/19  9:49 AM   Specimen: Nasopharyngeal Swab  Result Value Ref Range Status   SARS Coronavirus 2 NEGATIVE NEGATIVE Final    Comment: (NOTE) If result is NEGATIVE SARS-CoV-2 target nucleic acids are NOT DETECTED. The SARS-CoV-2 RNA is generally detectable in upper and lower  respiratory specimens during the acute phase of infection. The lowest  concentration of SARS-CoV-2 viral copies this assay can detect is 250  copies / mL. A negative result does not preclude SARS-CoV-2 infection  and should not be used as the sole basis for treatment or other  patient management decisions.  A negative result may occur with  improper specimen collection / handling, submission of specimen other  than nasopharyngeal swab, presence of viral mutation(s) within the  areas targeted by this assay, and inadequate number of viral copies  (<250 copies / mL). A negative result must be combined with clinical  observations, patient  history, and epidemiological information. If result is POSITIVE SARS-CoV-2 target nucleic acids are DETECTED. The SARS-CoV-2 RNA is generally detectable in upper and lower  respiratory specimens dur ing the acute phase of infection.  Positive  results are indicative of active infection with SARS-CoV-2.  Clinical  correlation with patient history and  other diagnostic information is  necessary to determine patient infection status.  Positive results do  not rule out bacterial infection or co-infection with other viruses. If result is PRESUMPTIVE POSTIVE SARS-CoV-2 nucleic acids MAY BE PRESENT.   A presumptive positive result was obtained on the submitted specimen  and confirmed on repeat testing.  While 2019 novel coronavirus  (SARS-CoV-2) nucleic acids may be present in the submitted sample  additional confirmatory testing may be necessary for epidemiological  and / or clinical management purposes  to differentiate between  SARS-CoV-2 and other Sarbecovirus currently known to infect humans.  If clinically indicated additional testing with an alternate test  methodology [phone]) is advised. The SARS-CoV-2 RNA is generally  detectable in upper and lower respiratory sp ecimens during the acute  phase of infection. The expected result is Negative. Fact Sheet for Patients:  StrictlyIdeas.no Fact Sheet for Healthcare Providers: BankingDealers.co.za This test is not yet approved or cleared by the [place] FDA and has been authorized for detection and/or diagnosis of SARS-CoV-2 by FDA under an Emergency Use Authorization (EUA).  This EUA will remain in effect (meaning this test can be used) for the duration of the COVID-19 declaration under Section 564(b)(1) of the Act, 21 U.S.C. section 360bbb-3(b)(1), unless the authorization is terminated or revoked sooner. Performed at [facility], [place] [address] [place],  [place] [address]      Time coordinating discharge: 45 minutes  SIGNED:   Tawni Millers, MD  [facility] 03/05/2019, 11:00 AM

## 2019-03-05 NOTE — Progress Notes (Signed)
   Subjective: 6 Days Post-Op Procedure(s) (LRB): INTRAMEDULLARY (IM) NAIL FEMORAL (Right)  S/p left total knee Pt has steadily improved with rom and mentation Sundowning has improved Denies any new symptoms or issues Patient reports pain as mild.  Objective:   VITALS:   Vitals:   03/05/19 0508 03/05/19 0842  BP:  132/69  Pulse: 72 72  Resp:    Temp:    SpO2: 97%     Left knee incision healing well Good early rom with flexion and extension Healing wounds to right hip s/p IM nail No rashes edema or signs of drainage  LABS No results for input(s): HGB, HCT, WBC, PLT in the last 72 hours.  No results for input(s): NA, K, BUN, CREATININE, GLUCOSE in the last 72 hours.   Assessment/Plan: 6 Days Post-Op Procedure(s) (LRB): INTRAMEDULLARY (IM) NAIL FEMORAL (Right) Plan for d/c to SNF once medically stable Currently stable from orthopedic standpoint for d/c to SNF Will continue to monitor him while in the hospital  F/u in 2 weeks in the office once discharged    [place] PA-C, [place] [facility] now [facility]  [facility] [address], [place], [place], [place] [address] Phone: [phone] [email or web address] Facebook  [employer]

## 2019-03-05 NOTE — Progress Notes (Signed)
Physical Therapy Treatment Patient Details Name: Jared [name] MRN: [id number] DOB: 03-21-1941 Today's Date: 03/05/2019    History of Present Illness Pt is a 78 y.o. male with recent L TKA (02/22/19) with d/c to SNF, pt with fall at SNF with R intertrochateric femur fracture. Pt s/p R IM nail of femur on 02/27/19. PMH includes HTN, PAF, stroke, OA, anxiety.    PT Comments    Pt required mod verbal encouragement to participate in PT this session, stating "I am dreading this". Pt required mod-max assist +1-2 for bed mobility and transfer today, ambulation not attempted due to pt reports of lightheadedness and diaphoresis, BP WNL and HR bradycardic at 48 bpm. Pt tolerated LE exercises well, but continues to present with significant bilateral LE weakness and L knee stiffness. Pt to d/c to SNF today.    Follow Up Recommendations  SNF     Equipment Recommendations  Other (comment)(defer)    Recommendations for Other Services       Precautions / Restrictions Precautions Precautions: Fall Restrictions Weight Bearing Restrictions: No RLE Weight Bearing: Weight bearing as tolerated Other Position/Activity Restrictions: LLE,RLE WBAT    Mobility  Bed Mobility Overal bed mobility: Needs Assistance Bed Mobility: Supine to Sit;Sit to Supine     Supine to sit: Mod assist;HOB elevated Sit to supine: Mod assist;+2 for safety/equipment   General bed mobility comments: mod assist for supine<>sit for trunk and LE management, scoot assist with use of bed pad.  Transfers Overall transfer level: Needs assistance Equipment used: Rolling walker (2 wheeled) Transfers: Sit to/from Stand Sit to Stand: +2 physical assistance;From elevated surface;+2 safety/equipment;Max assist         General transfer comment: Max assist +2 for power up, steadying, anterior translation of trunk, and VC for hand placement when rising. Pt stood ~5 seconds and states "I need to sit back down", due to feeling hot and  lightheaded. BP and HR 124/75 and 48 bpm respectively. Pt returned to supine.  Ambulation/Gait Ambulation/Gait assistance: (NT - pt limited by lightheadedness and diaphoresis)               Stairs             Wheelchair Mobility    Modified Rankin (Stroke Patients Only)       Balance Overall balance assessment: Needs assistance;History of Falls Sitting-balance support: Bilateral upper extremity supported;Feet supported Sitting balance-Leahy Scale: Fair     Standing balance support: Bilateral upper extremity supported;During functional activity Standing balance-Leahy Scale: Poor Standing balance comment: reliant on PT and RW for steadying in standing, both statically and dynamically                            Cognition Arousal/Alertness: Awake/alert Behavior During Therapy: WFL for tasks assessed/performed Overall Cognitive Status: Within Functional Limits for tasks assessed                                        Exercises Total Joint Exercises Ankle Circles/Pumps: AROM;10 reps;Both;Supine Quad Sets: AROM;Seated;Both;15 reps Heel Slides: AAROM;15 reps;Left;Supine Hip ABduction/ADduction: Both;10 reps;Seated;AAROM Goniometric ROM: L knee AAROM 10-80*, limited by pain and stiffness    General Comments        Pertinent Vitals/Pain Pain Assessment: Faces Faces Pain Scale: Hurts even more Pain Location: R hip and L knee Pain Descriptors / Indicators: Sore;Sharp Pain Intervention(s):  Limited activity within patient's tolerance;Monitored during session;Repositioned;Ice applied    Home Living                      Prior Function            PT Goals (current goals can now be found in the care plan section) Acute Rehab PT Goals Patient Stated Goal: recover PT Goal Formulation: With patient Time For Goal Achievement: 03/14/19 Potential to Achieve Goals: Good Progress towards PT goals: Progressing toward goals     Frequency    Min 4X/week      PT Plan Current plan remains appropriate    Co-evaluation              AM-PAC PT "6 Clicks" Mobility   Outcome Measure  Help needed turning from your back to your side while in a flat bed without using bedrails?: A Lot Help needed moving from lying on your back to sitting on the side of a flat bed without using bedrails?: A Lot Help needed moving to and from a bed to a chair (including a wheelchair)?: A Lot Help needed standing up from a chair using your arms (e.g., wheelchair or bedside chair)?: A Lot Help needed to walk in hospital room?: Total Help needed climbing 3-5 steps with a railing? : Total 6 Click Score: 10    End of Session Equipment Utilized During Treatment: Gait belt Activity Tolerance: Patient limited by fatigue;Patient limited by pain Patient left: in bed;with bed alarm set;with call bell/phone within reach Nurse Communication: Mobility status PT Visit Diagnosis: Other abnormalities of gait and mobility (R26.89);History of falling (Z91.81);Unsteadiness on feet (R26.81)     Time: 1093-2355 PT Time Calculation (min) (ACUTE ONLY): 27 min  Charges:  $Therapeutic Exercise: 8-22 mins $Therapeutic Activity: 8-22 mins                     Quantarius Genrich Conception Chancy, PT Acute Rehabilitation Services Pager [phone]  Office [phone]   Cortina Vultaggio D Elonda Husky 03/05/2019, 12:05 PM

## 2019-03-06 DIAGNOSIS — S72001D Fracture of unspecified part of neck of right femur, subsequent encounter for closed fracture with routine healing: Secondary | ICD-10-CM | POA: Diagnosis not present

## 2019-03-06 DIAGNOSIS — I480 Paroxysmal atrial fibrillation: Secondary | ICD-10-CM | POA: Diagnosis not present

## 2019-03-06 DIAGNOSIS — I10 Essential (primary) hypertension: Secondary | ICD-10-CM | POA: Diagnosis not present

## 2019-03-06 DIAGNOSIS — M1712 Unilateral primary osteoarthritis, left knee: Secondary | ICD-10-CM | POA: Diagnosis not present

## 2019-03-12 DIAGNOSIS — S72141D Displaced intertrochanteric fracture of right femur, subsequent encounter for closed fracture with routine healing: Secondary | ICD-10-CM | POA: Diagnosis not present

## 2019-03-12 DIAGNOSIS — Z471 Aftercare following joint replacement surgery: Secondary | ICD-10-CM | POA: Diagnosis not present

## 2019-03-12 DIAGNOSIS — Z96652 Presence of left artificial knee joint: Secondary | ICD-10-CM | POA: Diagnosis not present

## 2019-03-13 DIAGNOSIS — S72001D Fracture of unspecified part of neck of right femur, subsequent encounter for closed fracture with routine healing: Secondary | ICD-10-CM | POA: Diagnosis not present

## 2019-03-13 DIAGNOSIS — I10 Essential (primary) hypertension: Secondary | ICD-10-CM | POA: Diagnosis not present

## 2019-03-13 DIAGNOSIS — M1712 Unilateral primary osteoarthritis, left knee: Secondary | ICD-10-CM | POA: Diagnosis not present

## 2019-03-13 DIAGNOSIS — I480 Paroxysmal atrial fibrillation: Secondary | ICD-10-CM | POA: Diagnosis not present

## 2019-03-19 DIAGNOSIS — S7290XA Unspecified fracture of unspecified femur, initial encounter for closed fracture: Secondary | ICD-10-CM | POA: Diagnosis not present

## 2019-03-19 DIAGNOSIS — I4891 Unspecified atrial fibrillation: Secondary | ICD-10-CM | POA: Diagnosis not present

## 2019-03-19 DIAGNOSIS — I10 Essential (primary) hypertension: Secondary | ICD-10-CM | POA: Diagnosis not present

## 2019-03-19 DIAGNOSIS — G4730 Sleep apnea, unspecified: Secondary | ICD-10-CM | POA: Diagnosis not present

## 2019-03-19 DIAGNOSIS — I639 Cerebral infarction, unspecified: Secondary | ICD-10-CM | POA: Diagnosis not present

## 2019-03-20 DIAGNOSIS — I480 Paroxysmal atrial fibrillation: Secondary | ICD-10-CM | POA: Diagnosis not present

## 2019-03-20 DIAGNOSIS — M1712 Unilateral primary osteoarthritis, left knee: Secondary | ICD-10-CM | POA: Diagnosis not present

## 2019-03-20 DIAGNOSIS — I10 Essential (primary) hypertension: Secondary | ICD-10-CM | POA: Diagnosis not present

## 2019-03-20 DIAGNOSIS — S72001D Fracture of unspecified part of neck of right femur, subsequent encounter for closed fracture with routine healing: Secondary | ICD-10-CM | POA: Diagnosis not present

## 2019-03-23 DIAGNOSIS — F419 Anxiety disorder, unspecified: Secondary | ICD-10-CM | POA: Diagnosis not present

## 2019-03-23 DIAGNOSIS — I480 Paroxysmal atrial fibrillation: Secondary | ICD-10-CM | POA: Diagnosis not present

## 2019-03-23 DIAGNOSIS — F329 Major depressive disorder, single episode, unspecified: Secondary | ICD-10-CM | POA: Diagnosis not present

## 2019-03-23 DIAGNOSIS — K219 Gastro-esophageal reflux disease without esophagitis: Secondary | ICD-10-CM | POA: Diagnosis not present

## 2019-03-23 DIAGNOSIS — G4709 Other insomnia: Secondary | ICD-10-CM | POA: Diagnosis not present

## 2019-03-23 DIAGNOSIS — S72001D Fracture of unspecified part of neck of right femur, subsequent encounter for closed fracture with routine healing: Secondary | ICD-10-CM | POA: Diagnosis not present

## 2019-03-23 DIAGNOSIS — Z8673 Personal history of transient ischemic attack (TIA), and cerebral infarction without residual deficits: Secondary | ICD-10-CM | POA: Diagnosis not present

## 2019-03-23 DIAGNOSIS — I10 Essential (primary) hypertension: Secondary | ICD-10-CM | POA: Diagnosis not present

## 2019-03-25 DIAGNOSIS — M1712 Unilateral primary osteoarthritis, left knee: Secondary | ICD-10-CM | POA: Diagnosis not present

## 2019-03-25 DIAGNOSIS — M25511 Pain in right shoulder: Secondary | ICD-10-CM | POA: Diagnosis not present

## 2019-03-25 DIAGNOSIS — S72001D Fracture of unspecified part of neck of right femur, subsequent encounter for closed fracture with routine healing: Secondary | ICD-10-CM | POA: Diagnosis not present

## 2019-03-25 DIAGNOSIS — I10 Essential (primary) hypertension: Secondary | ICD-10-CM | POA: Diagnosis not present

## 2019-03-26 DIAGNOSIS — M25511 Pain in right shoulder: Secondary | ICD-10-CM | POA: Diagnosis not present

## 2019-03-26 DIAGNOSIS — K5909 Other constipation: Secondary | ICD-10-CM | POA: Diagnosis not present

## 2019-03-26 DIAGNOSIS — S72001D Fracture of unspecified part of neck of right femur, subsequent encounter for closed fracture with routine healing: Secondary | ICD-10-CM | POA: Diagnosis not present

## 2019-03-26 DIAGNOSIS — M1712 Unilateral primary osteoarthritis, left knee: Secondary | ICD-10-CM | POA: Diagnosis not present

## 2019-04-01 DIAGNOSIS — M1712 Unilateral primary osteoarthritis, left knee: Secondary | ICD-10-CM | POA: Diagnosis not present

## 2019-04-01 DIAGNOSIS — S72001D Fracture of unspecified part of neck of right femur, subsequent encounter for closed fracture with routine healing: Secondary | ICD-10-CM | POA: Diagnosis not present

## 2019-04-01 DIAGNOSIS — I480 Paroxysmal atrial fibrillation: Secondary | ICD-10-CM | POA: Diagnosis not present

## 2019-04-01 DIAGNOSIS — I10 Essential (primary) hypertension: Secondary | ICD-10-CM | POA: Diagnosis not present

## 2019-04-02 DIAGNOSIS — S72001D Fracture of unspecified part of neck of right femur, subsequent encounter for closed fracture with routine healing: Secondary | ICD-10-CM | POA: Diagnosis not present

## 2019-04-02 DIAGNOSIS — R319 Hematuria, unspecified: Secondary | ICD-10-CM | POA: Diagnosis not present

## 2019-04-02 DIAGNOSIS — I480 Paroxysmal atrial fibrillation: Secondary | ICD-10-CM | POA: Diagnosis not present

## 2019-04-02 DIAGNOSIS — M1712 Unilateral primary osteoarthritis, left knee: Secondary | ICD-10-CM | POA: Diagnosis not present

## 2019-04-05 DIAGNOSIS — R319 Hematuria, unspecified: Secondary | ICD-10-CM | POA: Diagnosis not present

## 2019-04-05 DIAGNOSIS — M1712 Unilateral primary osteoarthritis, left knee: Secondary | ICD-10-CM | POA: Diagnosis not present

## 2019-04-05 DIAGNOSIS — S72001D Fracture of unspecified part of neck of right femur, subsequent encounter for closed fracture with routine healing: Secondary | ICD-10-CM | POA: Diagnosis not present

## 2019-04-05 DIAGNOSIS — I480 Paroxysmal atrial fibrillation: Secondary | ICD-10-CM | POA: Diagnosis not present

## 2019-04-09 DIAGNOSIS — M25511 Pain in right shoulder: Secondary | ICD-10-CM | POA: Diagnosis not present

## 2019-04-09 DIAGNOSIS — S72001D Fracture of unspecified part of neck of right femur, subsequent encounter for closed fracture with routine healing: Secondary | ICD-10-CM | POA: Diagnosis not present

## 2019-04-09 DIAGNOSIS — M25662 Stiffness of left knee, not elsewhere classified: Secondary | ICD-10-CM | POA: Diagnosis not present

## 2019-04-09 DIAGNOSIS — M1712 Unilateral primary osteoarthritis, left knee: Secondary | ICD-10-CM | POA: Diagnosis not present

## 2019-04-09 DIAGNOSIS — I480 Paroxysmal atrial fibrillation: Secondary | ICD-10-CM | POA: Diagnosis not present

## 2019-04-09 DIAGNOSIS — S72141D Displaced intertrochanteric fracture of right femur, subsequent encounter for closed fracture with routine healing: Secondary | ICD-10-CM | POA: Diagnosis not present

## 2019-04-12 DIAGNOSIS — R2689 Other abnormalities of gait and mobility: Secondary | ICD-10-CM | POA: Diagnosis not present

## 2019-04-12 DIAGNOSIS — M1712 Unilateral primary osteoarthritis, left knee: Secondary | ICD-10-CM | POA: Diagnosis not present

## 2019-04-12 DIAGNOSIS — I480 Paroxysmal atrial fibrillation: Secondary | ICD-10-CM | POA: Diagnosis not present

## 2019-04-12 DIAGNOSIS — R278 Other lack of coordination: Secondary | ICD-10-CM | POA: Diagnosis not present

## 2019-04-12 DIAGNOSIS — S72141D Displaced intertrochanteric fracture of right femur, subsequent encounter for closed fracture with routine healing: Secondary | ICD-10-CM | POA: Diagnosis not present

## 2019-04-12 DIAGNOSIS — Z8673 Personal history of transient ischemic attack (TIA), and cerebral infarction without residual deficits: Secondary | ICD-10-CM | POA: Diagnosis not present

## 2019-04-26 DIAGNOSIS — M25662 Stiffness of left knee, not elsewhere classified: Secondary | ICD-10-CM | POA: Diagnosis not present

## 2019-04-30 ENCOUNTER — Emergency Department (HOSPITAL_COMMUNITY): Payer: PPO

## 2019-04-30 ENCOUNTER — Other Ambulatory Visit: Payer: Self-pay | Admitting: Internal Medicine

## 2019-04-30 ENCOUNTER — Other Ambulatory Visit: Payer: Self-pay

## 2019-04-30 ENCOUNTER — Emergency Department (HOSPITAL_COMMUNITY)
Admission: EM | Admit: 2019-04-30 | Discharge: 2019-04-30 | Disposition: A | Discharge status: Home / Self Care | Payer: PPO | Attending: Emergency Medicine | Admitting: Emergency Medicine

## 2019-04-30 ENCOUNTER — Encounter (HOSPITAL_COMMUNITY): Payer: Self-pay | Admitting: Emergency Medicine

## 2019-04-30 DIAGNOSIS — M545 Low back pain, unspecified: Secondary | ICD-10-CM

## 2019-04-30 DIAGNOSIS — R0689 Other abnormalities of breathing: Secondary | ICD-10-CM | POA: Diagnosis not present

## 2019-04-30 DIAGNOSIS — Z79899 Other long term (current) drug therapy: Secondary | ICD-10-CM | POA: Insufficient documentation

## 2019-04-30 DIAGNOSIS — I10 Essential (primary) hypertension: Secondary | ICD-10-CM | POA: Insufficient documentation

## 2019-04-30 DIAGNOSIS — R110 Nausea: Secondary | ICD-10-CM | POA: Diagnosis not present

## 2019-04-30 NOTE — ED Notes (Signed)
Patient transported to X-ray 

## 2019-04-30 NOTE — ED Provider Notes (Signed)
Medical screening examination/treatment/procedure(s) were conducted as a shared visit with non-physician practitioner(s) and myself.  I personally evaluated the patient during the encounter.    78 year old male here with back pain occurred after twisting.  He was treated by EMS now feels better.  X-rays are negative.  Stable for discharge   Lacretia Leigh, MD 04/30/19 2302

## 2019-04-30 NOTE — Telephone Encounter (Signed)
Last filled 02-15-19 #45 Last OV 01-04-19 Next OV 06-17-19 [facility]

## 2019-04-30 NOTE — ED Notes (Signed)
Pt back from radiology 

## 2019-04-30 NOTE — ED Notes (Signed)
Pt was verbalized discharge instructions. Pt had no further questions at this time. NAD. 

## 2019-04-30 NOTE — ED Triage Notes (Signed)
Per EMS: Pt arriving from home complaining of pain in lower right side of back radiating to lower right abdomen after bending over to plug something in the wall. Pt had an episode of nausea and vomiting due to pain on scene.  4 mg zofran 150 fentanyl   20 g L AC

## 2019-05-02 ENCOUNTER — Telehealth: Payer: Self-pay

## 2019-05-02 NOTE — Telephone Encounter (Signed)
Spoke to pt. He said he is doing a little better today.

## 2019-05-07 ENCOUNTER — Other Ambulatory Visit: Payer: Self-pay | Admitting: Internal Medicine

## 2019-05-08 NOTE — ED Provider Notes (Signed)
[facility] DEPT Provider Note   CSN: [id number] Arrival date & time: 04/30/19  2101     History   Chief Complaint Chief Complaint  Patient presents with  . Back Pain  . Nausea    HPI Jared [name] is a 78 y.o. male who presents with back pain.  He states that tonight he was sitting in his chair and he had some pizza on the table next to him.  When he twisted to reach the pizza he felt an acute onset of pain in his lower back.  Pain was severe in nature and constant.  Is never felt pain like that before.  Pain did not radiate to his legs but it radiated around to his abdomen. He had nausea but no vomiting. No bowel/bladder incontinence. No numbness or weakness. He was not able to move and therefore he decided to call EMS.  EMS gave him fentanyl and zofran prior to arrival.  On my initial history he states that his symptoms have resolved and he has no pain.  He has been ambulate to ambulate since he is been in the emergency department.     HPI  Past Medical History:  Diagnosis Date  . Allergic rhinitis   . Anticoagulant long-term use    xarelto  . Anxiety   . Benign localized prostatic hyperplasia with lower urinary tract symptoms (LUTS)   . Benzodiazepine dependence ([place])   . Edema of right lower extremity   . GERD (gastroesophageal reflux disease)   . Heart murmur   . Hematuria   . Hepatitis    History of  . History of colonic polyps   . History of CVA (cerebrovascular accident) without residual deficits 07/26/2017   crytogenic stroke -- acute left caudate body infarct   . History of kidney stones   . Hypertension   . Ischemic cardiomyopathy 07/26/2017   ef 35-40%;    TEE 07-31-2017 ef 60-65%  . MDD (major depressive disorder), recurrent, in partial remission ([place])   . OA (osteoarthritis)    knees,   . OSA (obstructive sleep apnea)    intolerant cpap  . PAF (paroxysmal atrial fibrillation) [facility])    cardiologist-  dr g. taylor  . Skin  cancer    Childhood, right foot on toe  . Status post placement of implantable loop recorder 07/31/2017  . Stroke ([place])   . Swelling of right foot 12/2018  . Throat mass   . Wears glasses     Patient Active Problem List   Diagnosis Date Noted  . Closed comminuted intertrochanteric fracture of proximal end of right femur ([place]) 02/27/2019  . Fall 02/26/2019  . Closed displaced intertrochanteric fracture of right femur ([place]) 02/26/2019  . Closed right hip fracture ([place]) 02/26/2019  . Stroke ([place]) 02/26/2019  . Femur fracture, right ([place]) 02/26/2019  . Status post total knee replacement, left 02/22/2019  . Swelling of right foot 01/04/2019  . Preoperative evaluation to rule out surgical contraindication 11/14/2018  . Hematuria 10/28/2018  . Osteoarthritis of right knee 06/20/2018  . Atrial fibrillation ([place])   . MDD (major depressive disorder), recurrent, in partial remission ([place])   . Benzodiazepine dependence ([place])   . Ejection fraction < 50% 11/07/2017  . Hyperlipidemia   . Dizziness 07/27/2017  . Frequent PVCs 07/27/2017  . Cryptogenic stroke ([place]) 07/27/2017  . Diverticulosis of colon without hemorrhage 02/06/2013  . Right inguinal hernia 10/12/2012  . BPH (benign prostatic hyperplasia) 04/08/2009  . OSA (obstructive sleep  apnea) 10/02/2008  . Essential hypertension 10/05/2007  . Allergic rhinitis 10/05/2007  . GERD 10/05/2007  . History of colonic polyps 10/05/2007  . NEPHROLITHIASIS, HX OF 10/05/2007    Past Surgical History:  Procedure Laterality Date  . APPENDECTOMY  1970s  . CATARACT EXTRACTION W/ INTRAOCULAR LENS  IMPLANT, BILATERAL Bilateral 2014  . CHOLECYSTECTOMY  1970s  . COLONOSCOPY    . CYSTOSCOPY    . FEMUR IM NAIL Right 02/27/2019   Procedure: INTRAMEDULLARY (IM) NAIL FEMORAL;  Surgeon: Rod Can, MD;  Location: [facility];  Service: Orthopedics;  Laterality: Right;  . FOOT SURGERY Left yrs ago  . HERNIA REPAIR  2014  . LOOP RECORDER INSERTION N/A  07/31/2017   Procedure: LOOP RECORDER INSERTION;  Surgeon: Evans Lance, MD;  Location: [place] CV LAB;  Service: Cardiovascular;  Laterality: N/A;  . TEE WITHOUT CARDIOVERSION N/A 07/31/2017   Procedure: TRANSESOPHAGEAL ECHOCARDIOGRAM (TEE) WITH LOOP;  Surgeon: Dorothy Spark, MD;  Location: [facility];  Service: Cardiovascular;  Laterality: N/A;  . TOTAL KNEE ARTHROPLASTY Left 02/22/2019   Procedure: TOTAL KNEE ARTHROPLASTY;  Surgeon: [name], MD;  Location: [facility];  Service: Orthopedics;  Laterality: Left;  . WRIST FRACTURE SURGERY  1970s   right x2  unsure if any hardware remain   ;    left x1 with pinning then removed        Home Medications    Prior to Admission medications   Medication Sig Start Date End Date Taking? Authorizing Provider  acetaminophen (TYLENOL) 325 MG tablet Take 325 mg by mouth every 6 (six) hours as needed for moderate pain or headache.    [provider]  ALPRAZolam (XANAX) 1 MG tablet TAKE 0.5 TABLETS (0.5 MG TOTAL) BY MOUTH 3 (THREE) TIMES DAILY AS NEEDED FOR ANXIETY. 04/30/19   [name], MD  amLODipine (NORVASC) 10 MG tablet TAKE 1 TABLET BY MOUTH EVERY DAY 05/08/19   [name], MD  bisacodyl (DULCOLAX) 10 MG suppository Place 1 suppository (10 mg total) rectally daily as needed for moderate constipation. 03/05/19   Arrien, Jimmy Picket, MD  buPROPion (WELLBUTRIN SR) 150 MG 12 hr tablet TAKE 1 TABLET BY MOUTH TWICE A DAY Patient taking differently: Take 150 mg by mouth 2 (two) times daily.  11/16/18   [name], MD  carvedilol (COREG) 6.25 MG tablet Take 1 tablet (6.25 mg total) by mouth 2 (two) times daily with a meal. 05/07/18   Marletta Lor, MD  dutasteride (AVODART) 0.5 MG capsule Take 1 capsule (0.5 mg total) by mouth daily. 09/17/18   [name], MD  Menthol, Topical Analgesic, (ICY HOT EX) Apply 1 application topically daily as needed (pain).    [provider]  methocarbamol  (ROBAXIN) 500 MG tablet Take 1 tablet (500 mg total) by mouth 3 (three) times daily as needed. Patient taking differently: Take 500 mg by mouth 3 (three) times daily as needed for muscle spasms.  02/25/19   [name], MD  mirtazapine (REMERON) 15 MG tablet TAKE 1 TABLET BY MOUTH EVERYDAY AT BEDTIME Patient taking differently: Take 15 mg by mouth at bedtime.  01/14/19   [name], MD  Multiple Vitamin (MULTIVITAMIN WITH MINERALS) TABS tablet Take 1 tablet by mouth daily.    [provider]  omeprazole (PRILOSEC) 10 MG capsule Take 1 capsule (10 mg total) by mouth daily. 02/21/19   [name], MD  oxyCODONE-acetaminophen (PERCOCET) 5-325 MG tablet Take 1 tablet by  mouth every 4 (four) hours as needed for moderate pain or severe pain. 02/28/19   Swinteck, Aaron Edelman, MD  polyethylene glycol (MIRALAX / GLYCOLAX) 17 g packet Take 17 g by mouth daily. 03/05/19   Arrien, Jimmy Picket, MD  Propylene Glycol (SYSTANE BALANCE) 0.6 % SOLN Place 1 drop into both eyes daily as needed (dry eyes).    [provider]  RESTASIS 0.05 % ophthalmic emulsion Place 1 drop 2 (two) times daily into both eyes. 08/03/17   [provider]  rivaroxaban (XARELTO) 20 MG TABS tablet Take 1 tablet (20 mg total) by mouth daily with supper. 02/25/19   [name], MD  timolol (TIMOPTIC) 0.5 % ophthalmic solution Place 1 drop into both eyes 2 (two) times daily.  06/29/17   [provider]    Family History Family History  Problem Relation Age of Onset  . Heart disease Mother   . Heart disease Father   . Colon cancer Maternal Aunt 70    Social History Social History   Tobacco Use  . Smoking status: Never Smoker  . Smokeless tobacco: Never Used  Substance Use Topics  . Alcohol use: No  . Drug use: Never     Allergies   Antihistamines, diphenhydramine-type; Aspirin; Codeine; and Pollen extract   Review of Systems Review of Systems  Gastrointestinal: Positive for  abdominal pain and nausea. Negative for vomiting.  Musculoskeletal: Positive for back pain.  Neurological: Negative for weakness and numbness.  All other systems reviewed and are negative.    Physical Exam Updated Vital Signs BP 140/85 (BP Location: Left Arm)   Pulse 80   Temp 98.1 F (36.7 C) (Oral)   Resp (!) 21   Ht 5\' 10"  (1.778 m)   Wt 86.6 kg   SpO2 99%   BMI 27.41 kg/m   Physical Exam Vitals signs and nursing note reviewed.  Constitutional:      General: He is not in acute distress.    Appearance: Normal appearance. He is well-developed. He is not ill-appearing.     Comments: Calm, cooperative. Pleasant  HENT:     Head: Normocephalic and atraumatic.  Eyes:     General: No scleral icterus.       Right eye: No discharge.        Left eye: No discharge.     Conjunctiva/sclera: Conjunctivae normal.     Pupils: Pupils are equal, round, and reactive to light.  Neck:     Musculoskeletal: Normal range of motion.  Cardiovascular:     Rate and Rhythm: Normal rate and regular rhythm.  Pulmonary:     Effort: Pulmonary effort is normal. No respiratory distress.     Breath sounds: Normal breath sounds.  Abdominal:     General: There is no distension.     Palpations: Abdomen is soft.     Tenderness: There is no abdominal tenderness.  Musculoskeletal:     Comments: No back tenderness. No pain with ROM. Ambulatory  Skin:    General: Skin is warm and dry.  Neurological:     Mental Status: He is alert and oriented to person, place, and time.  Psychiatric:        Behavior: Behavior normal.      ED Treatments / Results  Labs (all labs ordered are listed, but only abnormal results are displayed) Labs Reviewed - No data to display  EKG None  Radiology No results found.  Procedures Procedures (including critical care time)  Medications Ordered in ED  Medications - No data to display   Initial Impression / Assessment and Plan / ED Course  I have reviewed the  triage vital signs and the nursing notes.  Pertinent labs & imaging results that were available during my care of the patient were reviewed by me and considered in my medical decision making (see chart for details).  78 year old male presents with acute back pain after a twisting motion earlier tonight.  Symptoms have resolved after treatment with fentanyl and Zofran by EMS.  His x-ray here is negative.  He is completely asymptomatic and ambulatory.  He is comfortable with discharge.  Shared visit with Dr. Zenia Resides.  He was advised to return if worsening.  Final Clinical Impressions(s) / ED Diagnoses   Final diagnoses:  Acute right-sided low back pain without sciatica    ED Discharge Orders    None       Iris Pert 05/08/19 2042    Lacretia Leigh, MD 05/11/19 [phone]

## 2019-05-13 DIAGNOSIS — M1712 Unilateral primary osteoarthritis, left knee: Secondary | ICD-10-CM | POA: Diagnosis not present

## 2019-05-13 DIAGNOSIS — I480 Paroxysmal atrial fibrillation: Secondary | ICD-10-CM | POA: Diagnosis not present

## 2019-05-13 DIAGNOSIS — S72141D Displaced intertrochanteric fracture of right femur, subsequent encounter for closed fracture with routine healing: Secondary | ICD-10-CM | POA: Diagnosis not present

## 2019-05-13 DIAGNOSIS — M25669 Stiffness of unspecified knee, not elsewhere classified: Secondary | ICD-10-CM | POA: Diagnosis not present

## 2019-05-13 DIAGNOSIS — R278 Other lack of coordination: Secondary | ICD-10-CM | POA: Diagnosis not present

## 2019-05-13 DIAGNOSIS — R2689 Other abnormalities of gait and mobility: Secondary | ICD-10-CM | POA: Diagnosis not present

## 2019-05-13 DIAGNOSIS — Z8673 Personal history of transient ischemic attack (TIA), and cerebral infarction without residual deficits: Secondary | ICD-10-CM | POA: Diagnosis not present

## 2019-05-15 DIAGNOSIS — M25669 Stiffness of unspecified knee, not elsewhere classified: Secondary | ICD-10-CM | POA: Diagnosis not present

## 2019-05-20 DIAGNOSIS — M25669 Stiffness of unspecified knee, not elsewhere classified: Secondary | ICD-10-CM | POA: Diagnosis not present

## 2019-05-21 DIAGNOSIS — S72141D Displaced intertrochanteric fracture of right femur, subsequent encounter for closed fracture with routine healing: Secondary | ICD-10-CM | POA: Diagnosis not present

## 2019-05-28 DIAGNOSIS — M25669 Stiffness of unspecified knee, not elsewhere classified: Secondary | ICD-10-CM | POA: Diagnosis not present

## 2019-05-30 DIAGNOSIS — M25669 Stiffness of unspecified knee, not elsewhere classified: Secondary | ICD-10-CM | POA: Diagnosis not present

## 2019-06-03 DIAGNOSIS — M25562 Pain in left knee: Secondary | ICD-10-CM | POA: Diagnosis not present

## 2019-06-05 DIAGNOSIS — M25551 Pain in right hip: Secondary | ICD-10-CM | POA: Diagnosis not present

## 2019-06-07 ENCOUNTER — Other Ambulatory Visit: Payer: Self-pay | Admitting: Internal Medicine

## 2019-06-07 DIAGNOSIS — M25551 Pain in right hip: Secondary | ICD-10-CM | POA: Diagnosis not present

## 2019-06-07 MED ORDER — ALPRAZOLAM 1 MG PO TABS: 0.5000 mg | ORAL_TABLET | Freq: Three times a day (TID) | ORAL | 0 refills | Status: DC | PRN

## 2019-06-07 NOTE — Telephone Encounter (Signed)
Patient called.  Patient said he had knee on 02/24/09 and  hip surgery on 02/27/19.  Patient is requesting his Alprazolam be refilled.  Patient said he takes 1 pill and 1/3 of another pill per day for pain.  Patient has 1 pill left.  [facility] uses [facility].  Patient has a cpx scheduled on 06/17/19.

## 2019-06-07 NOTE — Telephone Encounter (Signed)
Last filled 04-30-19

## 2019-06-11 DIAGNOSIS — M25511 Pain in right shoulder: Secondary | ICD-10-CM | POA: Diagnosis not present

## 2019-06-12 DIAGNOSIS — M25551 Pain in right hip: Secondary | ICD-10-CM | POA: Diagnosis not present

## 2019-06-13 DIAGNOSIS — R278 Other lack of coordination: Secondary | ICD-10-CM | POA: Diagnosis not present

## 2019-06-13 DIAGNOSIS — I480 Paroxysmal atrial fibrillation: Secondary | ICD-10-CM | POA: Diagnosis not present

## 2019-06-13 DIAGNOSIS — S72141D Displaced intertrochanteric fracture of right femur, subsequent encounter for closed fracture with routine healing: Secondary | ICD-10-CM | POA: Diagnosis not present

## 2019-06-13 DIAGNOSIS — M1712 Unilateral primary osteoarthritis, left knee: Secondary | ICD-10-CM | POA: Diagnosis not present

## 2019-06-13 DIAGNOSIS — R2689 Other abnormalities of gait and mobility: Secondary | ICD-10-CM | POA: Diagnosis not present

## 2019-06-13 DIAGNOSIS — Z8673 Personal history of transient ischemic attack (TIA), and cerebral infarction without residual deficits: Secondary | ICD-10-CM | POA: Diagnosis not present

## 2019-06-14 DIAGNOSIS — M25551 Pain in right hip: Secondary | ICD-10-CM | POA: Diagnosis not present

## 2019-06-17 ENCOUNTER — Ambulatory Visit (INDEPENDENT_AMBULATORY_CARE_PROVIDER_SITE_OTHER): Payer: PPO | Admitting: Internal Medicine

## 2019-06-17 ENCOUNTER — Encounter: Payer: Self-pay | Admitting: Internal Medicine

## 2019-06-17 ENCOUNTER — Other Ambulatory Visit: Payer: Self-pay

## 2019-06-17 VITALS — BP 118/70 | HR 87 | Temp 98.5°F | Ht 70.0 in | Wt 195.0 lb

## 2019-06-17 DIAGNOSIS — F3341 Major depressive disorder, recurrent, in partial remission: Secondary | ICD-10-CM | POA: Diagnosis not present

## 2019-06-17 DIAGNOSIS — Z0000 Encounter for general adult medical examination without abnormal findings: Secondary | ICD-10-CM | POA: Diagnosis not present

## 2019-06-17 DIAGNOSIS — I10 Essential (primary) hypertension: Secondary | ICD-10-CM | POA: Diagnosis not present

## 2019-06-17 DIAGNOSIS — I480 Paroxysmal atrial fibrillation: Secondary | ICD-10-CM | POA: Diagnosis not present

## 2019-06-17 DIAGNOSIS — Z7189 Other specified counseling: Secondary | ICD-10-CM | POA: Diagnosis not present

## 2019-06-17 DIAGNOSIS — F1320 Sedative, hypnotic or anxiolytic dependence, uncomplicated: Secondary | ICD-10-CM

## 2019-06-17 NOTE — Assessment & Plan Note (Signed)
BP Readings from Last 3 Encounters:  06/17/19 118/70  04/30/19 140/85  03/05/19 133/82   Good control

## 2019-06-17 NOTE — Assessment & Plan Note (Signed)
See social history 

## 2019-06-17 NOTE — Assessment & Plan Note (Signed)
Uses the alprazolam mostly at bedtime

## 2019-06-17 NOTE — Progress Notes (Signed)
Subjective:    Patient ID: Jared [name], male    DOB: Jan 09, 1941, 78 y.o.   MRN: [id number]  HPI Here for Medicare wellness visit and follow up of chronic health conditions Reviewed form and advanced directives  Reviewed other doctors No alcohol or tobacco No help at home--but able to do instrumental ADLs himself--as much as he can Vision is fine Hearing is good Did have fall with injury Chronic depression Memory is "pretty good"--does tend to misplace things at times  Had left TKR in June Fell at rehab and broke right hip Finished rehab and got home ~1 month ago Still using walker  Depression is worse In house along [place] go to church or visit friends, etc Will "get into a funk and have an anxiety attack" Has to find something to "take my mind off it" No suicidal thoughts Does have a friend he talks to at night [place]es [place] to pick up small amount of food--tries to avoid grocery store Using the alprazolam just at bedtime lately--helps him sleep better  Nocturia is improved--sleeping better with increased alprazolam Flow is okay during the day  No chest pain No palpitations No dizziness or syncope Some ankle and leg edema--noticed it first in rehab  Current Outpatient Medications on File Prior to Visit  Medication Sig Dispense Refill  . acetaminophen (TYLENOL) 325 MG tablet Take 325 mg by mouth every 6 (six) hours as needed for moderate pain or headache.    . ALPRAZolam (XANAX) 1 MG tablet Take 0.5 tablets (0.5 mg total) by mouth 3 (three) times daily as needed for anxiety. 45 tablet 0  . amLODipine (NORVASC) 10 MG tablet TAKE 1 TABLET BY MOUTH EVERY DAY 90 tablet 3  . bisacodyl (DULCOLAX) 10 MG suppository Place 1 suppository (10 mg total) rectally daily as needed for moderate constipation. 12 suppository 0  . buPROPion (WELLBUTRIN SR) 150 MG 12 hr tablet TAKE 1 TABLET BY MOUTH TWICE A DAY (Patient taking differently: Take 150 mg by mouth 2 (two) times daily. )  180 tablet 3  . dutasteride (AVODART) 0.5 MG capsule Take 1 capsule (0.5 mg total) by mouth daily. 90 capsule 3  . Menthol, Topical Analgesic, (ICY HOT EX) Apply 1 application topically daily as needed (pain).    . mirtazapine (REMERON) 15 MG tablet TAKE 1 TABLET BY MOUTH EVERYDAY AT BEDTIME (Patient taking differently: Take 15 mg by mouth at bedtime. ) 30 tablet 5  . Multiple Vitamin (MULTIVITAMIN WITH MINERALS) TABS tablet Take 1 tablet by mouth daily.    Marland Kitchen Propylene Glycol (SYSTANE BALANCE) 0.6 % SOLN Place 1 drop into both eyes daily as needed (dry eyes).    . RESTASIS 0.05 % ophthalmic emulsion Place 1 drop 2 (two) times daily into both eyes.  4  . rivaroxaban (XARELTO) 20 MG TABS tablet Take 1 tablet (20 mg total) by mouth daily with supper. 30 tablet 0  . timolol (TIMOPTIC) 0.5 % ophthalmic solution Place 1 drop into both eyes 2 (two) times daily.   0  . carvedilol (COREG) 6.25 MG tablet Take 1 tablet (6.25 mg total) by mouth 2 (two) times daily with a meal. (Patient not taking: Reported on 06/17/2019) 180 tablet 2  . methocarbamol (ROBAXIN) 500 MG tablet Take 1 tablet (500 mg total) by mouth 3 (three) times daily as needed. (Patient not taking: Reported on 06/17/2019) 40 tablet 1   No current facility-administered medications on file prior to visit.     Allergies  Allergen Reactions  . Antihistamines, Diphenhydramine-Type     Blood in urine   . Aspirin     Blood in urine   . Codeine Nausea And Vomiting    Must take with food  . Pollen Extract Cough    Past Medical History:  Diagnosis Date  . Allergic rhinitis   . Anticoagulant long-term use    xarelto  . Anxiety   . Benign localized prostatic hyperplasia with lower urinary tract symptoms (LUTS)   . Benzodiazepine dependence ([place])   . Edema of right lower extremity   . GERD (gastroesophageal reflux disease)   . Heart murmur   . Hematuria   . Hepatitis    History of  . History of colonic polyps   . History of CVA  (cerebrovascular accident) without residual deficits 07/26/2017   crytogenic stroke -- acute left caudate body infarct   . History of kidney stones   . Hypertension   . Ischemic cardiomyopathy 07/26/2017   ef 35-40%;    TEE 07-31-2017 ef 60-65%  . MDD (major depressive disorder), recurrent, in partial remission ([place])   . OA (osteoarthritis)    knees,   . OSA (obstructive sleep apnea)    intolerant cpap  . PAF (paroxysmal atrial fibrillation) [facility])    cardiologist-  dr g. taylor  . Skin cancer    Childhood, right foot on toe  . Status post placement of implantable loop recorder 07/31/2017  . Stroke ([place])   . Swelling of right foot 12/2018  . Throat mass   . Wears glasses     Past Surgical History:  Procedure Laterality Date  . APPENDECTOMY  1970s  . CATARACT EXTRACTION W/ INTRAOCULAR LENS  IMPLANT, BILATERAL Bilateral 2014  . CHOLECYSTECTOMY  1970s  . COLONOSCOPY    . CYSTOSCOPY    . FEMUR IM NAIL Right 02/27/2019   Procedure: INTRAMEDULLARY (IM) NAIL FEMORAL;  Surgeon: Rod Can, MD;  [facility];  Service: Orthopedics;  Laterality: Right;  . FOOT SURGERY Left yrs ago  . HERNIA REPAIR  2014  . LOOP RECORDER INSERTION N/A 07/31/2017   Procedure: LOOP RECORDER INSERTION;  Surgeon: Evans Lance, MD;  Location: [place] CV LAB;  Service: Cardiovascular;  Laterality: N/A;  . TEE WITHOUT CARDIOVERSION N/A 07/31/2017   Procedure: TRANSESOPHAGEAL ECHOCARDIOGRAM (TEE) WITH LOOP;  Surgeon: Dorothy Spark, MD;  Location: [facility];  Service: Cardiovascular;  Laterality: N/A;  . TOTAL KNEE ARTHROPLASTY Left 02/22/2019   Procedure: TOTAL KNEE ARTHROPLASTY;  Surgeon: [name], MD;  [facility];  Service: Orthopedics;  Laterality: Left;  . WRIST FRACTURE SURGERY  1970s   right x2  unsure if any hardware remain   ;    left x1 with pinning then removed    Family History  Problem Relation Age of Onset  . Heart disease Mother   . Heart disease Father   .  Colon cancer Maternal Aunt 70    Social History   Socioeconomic History  . Marital status: Widowed    Spouse name: Not on file  . Number of children: 2  . Years of education: Not on file  . Highest education level: Not on file  Occupational History  . 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 Financial resource strain: Not on file  . Food insecurity    Worry: Not on file    Inability: Not on file  . Transportation needs    Medical: Not on file  Non-medical: Not on file  Tobacco Use  . Smoking status: Never Smoker  . Smokeless tobacco: Never Used  Substance and Sexual Activity  . Alcohol use: No  . Drug use: Never  . Sexual activity: Not on file  Lifestyle  . Physical activity    Days per week: Not on file    Minutes per session: Not on file  . Stress: Not on file  Relationships  . Social Herbalist on phone: Not on file    Gets together: Not on file    Attends religious service: Not on file    Active member of club or organization: Not on file    Attends meetings of clubs or organizations: Not on file    Relationship status: Not on file  . Intimate partner violence    Fear of current or ex partner: Not on file    Emotionally abused: Not on file    Physically abused: Not on file    Forced sexual activity: Not on file  Other Topics Concern  . Not on file  Social History Narrative   Divorced twice---first wife died   2 children---daughter local, son in [place]      Has living will   [place] is daughter   Would accept resuscitation   No prolonged tube feeds if cognitively unaware   Review of Systems No recurrence of hematuria--did see urologist Appetite not great in past week--doesn't have food he likes in the house Weight is rebounded and at his normal Sleeping better now Intermittent constipation--tries to avoid laxatives. Stool softeners eventually help No blood in stool Wears seat belt No residual  deficits from past stroke---just poor handwriting. No tremor Teeth are okay. Overdue for dentist No suspicious skin lesions Ongoing joint pains---stiff upon first arising and painful at first to walk    Objective:   Physical Exam  Constitutional: He is oriented to person, place, and time. He appears well-developed. No distress.  HENT:  Mouth/Throat: Oropharynx is clear and moist. No oropharyngeal exudate.  Neck: No thyromegaly present.  Cardiovascular: Normal rate, regular rhythm and intact distal pulses. Exam reveals no gallop.  No murmur heard. Bigeminy mostly  Respiratory: Effort normal and breath sounds normal. No respiratory distress. He has no wheezes. He has no rales.  GI: Soft. There is no abdominal tenderness.  Musculoskeletal:     Comments: Slow but able to get up on the table 1+ ankle edema  Lymphadenopathy:    He has no cervical adenopathy.  Neurological: He is alert and oriented to person, place, and time.  President--- "Dwaine Deter, [place]" [phone] something [id number] Recall 2/3  Skin: No rash noted. No erythema.  Psychiatric: He has a normal mood and affect. His behavior is normal.           Assessment & Plan:

## 2019-06-17 NOTE — Assessment & Plan Note (Signed)
Worse because of COVID and isolation Discussed options---will not make any changes now

## 2019-06-17 NOTE — Assessment & Plan Note (Signed)
I have personally reviewed the Medicare Annual Wellness questionnaire and have noted 1. The patient's medical and social history 2. Their use of alcohol, tobacco or illicit drugs 3. Their current medications and supplements 4. The patient's functional ability including ADL's, fall risks, home safety risks and hearing or visual             impairment. 5. Diet and physical activities 6. Evidence for depression or mood disorders  The patients weight, height, BMI and visual acuity have been recorded in the chart I have made referrals, counseling and provided education to the patient based review of the above and I have provided the pt with a written personalized care plan for preventive services.  I have provided you with a copy of your personalized plan for preventive services. Please take the time to review along with your updated medication list.  Working back from 2 recent surgeries Big problem with isolation and getting adequate food, etc----discussed strategies Flu vaccine last week Done with cancer screening

## 2019-06-17 NOTE — Assessment & Plan Note (Signed)
Sounds regular now--bigeminy? On the xarelto

## 2019-06-18 LAB — COMPREHENSIVE METABOLIC PANEL
ALT: 19 U/L (ref 0–53)
AST: 19 U/L (ref 0–37)
Albumin: 4 g/dL (ref 3.5–5.2)
Alkaline Phosphatase: 126 U/L — ABNORMAL HIGH (ref 39–117)
BUN: 19 mg/dL (ref 6–23)
CO2: 28 mEq/L (ref 19–32)
Calcium: 9.7 mg/dL (ref 8.4–10.5)
Chloride: 104 mEq/L (ref 96–112)
Creatinine, Ser: 1.08 mg/dL (ref 0.40–1.50)
GFR: 66.36 mL/min (ref >60.00)
Glucose, Bld: 95 mg/dL (ref 70–99)
Potassium: 3.9 mEq/L (ref 3.5–5.1)
Sodium: 140 mEq/L (ref 135–145)
Total Bilirubin: 0.6 mg/dL (ref 0.2–1.2)
Total Protein: 7.3 g/dL (ref 6.0–8.3)

## 2019-06-18 LAB — CBC
HCT: 40 % (ref 39.0–52.0)
Hemoglobin: 13.5 g/dL (ref 13.0–17.0)
MCHC: 33.8 g/dL (ref 30.0–36.0)
MCV: 88.7 fl (ref 78.0–100.0)
Platelets: 206 10*3/uL (ref 150.0–400.0)
RBC: 4.51 Mil/uL (ref 4.22–5.81)
RDW: 13.1 % (ref 11.5–15.5)
WBC: 5.6 10*3/uL (ref 4.0–10.5)

## 2019-06-18 LAB — T4, FREE: Free T4: 0.82 ng/dL (ref 0.60–1.60)

## 2019-06-24 DIAGNOSIS — M25551 Pain in right hip: Secondary | ICD-10-CM | POA: Diagnosis not present

## 2019-06-27 DIAGNOSIS — M25551 Pain in right hip: Secondary | ICD-10-CM | POA: Diagnosis not present

## 2019-06-30 ENCOUNTER — Other Ambulatory Visit: Payer: Self-pay | Admitting: Internal Medicine

## 2019-07-01 DIAGNOSIS — M25551 Pain in right hip: Secondary | ICD-10-CM | POA: Diagnosis not present

## 2019-07-01 NOTE — Telephone Encounter (Signed)
Pt last saw Dr Lovena Le 06/19/18, overdue for follow-up 1 year f/u.  Pt's last labs 06/17/19 Creat 1.08, age 78, weight 88.5kg, CrCl 72.84.  Based on CrCl pt is on appropriate dosage of Xarelto 20mg  QD.  Will refill x 1 with note to schedule follow-up for future refills.

## 2019-07-05 DIAGNOSIS — M25551 Pain in right hip: Secondary | ICD-10-CM | POA: Diagnosis not present

## 2019-07-09 DIAGNOSIS — M25551 Pain in right hip: Secondary | ICD-10-CM | POA: Diagnosis not present

## 2019-07-12 ENCOUNTER — Other Ambulatory Visit: Payer: Self-pay | Admitting: Internal Medicine

## 2019-07-12 NOTE — Telephone Encounter (Signed)
Last filled 06-07-19 #45 Last OV 06-17-19 Next OV 08-19-19 [facility]

## 2019-07-13 DIAGNOSIS — R278 Other lack of coordination: Secondary | ICD-10-CM | POA: Diagnosis not present

## 2019-07-13 DIAGNOSIS — M1712 Unilateral primary osteoarthritis, left knee: Secondary | ICD-10-CM | POA: Diagnosis not present

## 2019-07-13 DIAGNOSIS — Z8673 Personal history of transient ischemic attack (TIA), and cerebral infarction without residual deficits: Secondary | ICD-10-CM | POA: Diagnosis not present

## 2019-07-13 DIAGNOSIS — R2689 Other abnormalities of gait and mobility: Secondary | ICD-10-CM | POA: Diagnosis not present

## 2019-07-13 DIAGNOSIS — S72141D Displaced intertrochanteric fracture of right femur, subsequent encounter for closed fracture with routine healing: Secondary | ICD-10-CM | POA: Diagnosis not present

## 2019-07-13 DIAGNOSIS — I480 Paroxysmal atrial fibrillation: Secondary | ICD-10-CM | POA: Diagnosis not present

## 2019-07-16 DIAGNOSIS — M25551 Pain in right hip: Secondary | ICD-10-CM | POA: Diagnosis not present

## 2019-07-18 DIAGNOSIS — M25551 Pain in right hip: Secondary | ICD-10-CM | POA: Diagnosis not present

## 2019-07-25 DIAGNOSIS — M25551 Pain in right hip: Secondary | ICD-10-CM | POA: Diagnosis not present

## 2019-07-26 ENCOUNTER — Telehealth: Payer: Self-pay

## 2019-07-26 ENCOUNTER — Other Ambulatory Visit: Payer: Self-pay | Admitting: Internal Medicine

## 2019-07-26 NOTE — Telephone Encounter (Addendum)
Xarelto 20mg  refill request received. Pt is 78 years old, weight- 88.5kg, Crea-1.08 on 06/17/2019, last seen by Dr. Lovena Le on 06/16/2018-needs an appt, Diagnosis-Afib, CrCl-72.87ml/min; Dose is appropriate based on dosing criteria. Will send in refill to requested pharmacy.   Called pt because he is overdue for an appt with Cardiologist. He is aware that I am sending in the refill and I transferred him to schedule an appt with Dr. Lovena Le.

## 2019-07-26 NOTE — Telephone Encounter (Signed)
Got message from patient rep that Pt calling because he got a call about returning a monitor.  Will call Pt.   Call placed to Pt after reading all notes.  Per Pt he states he got a call to return his monitor.  Loop was implanted in 2018.  No note anywhere that anyone called him.  He did stop submitting monthly reports in April, unable to determine why.  Advised this nurse does not see where he was advised to return his box.  Pt has f/u with Andy-can discuss at that time.  Will forward to device to clarify why no longer monthly monitoring.

## 2019-07-26 NOTE — Telephone Encounter (Signed)
I spoke with the pt to let him know we do want him to keep his monitor plugged in so it can send [place] automatic transmissions. I told him the only time we will need a manual transmission is when we call to ask for one. The pt verbalized understanding.

## 2019-07-30 DIAGNOSIS — M25551 Pain in right hip: Secondary | ICD-10-CM | POA: Diagnosis not present

## 2019-08-01 DIAGNOSIS — M25551 Pain in right hip: Secondary | ICD-10-CM | POA: Diagnosis not present

## 2019-08-05 ENCOUNTER — Other Ambulatory Visit: Payer: Self-pay | Admitting: Internal Medicine

## 2019-08-11 NOTE — Progress Notes (Signed)
Electrophysiology Office Note Date: 08/12/2019  ID:  Jared [name], DOB 08/23/1941, MRN [id number]  PCP: [name], MD Primary Cardiologist: No primary care provider on file. Electrophysiologist: Dr. Lovena Le  CC: Pacemaker follow-up  Jared [name] is a 78 y.o. male seen today for Dr. Lovena Le. he presents today for routine electrophysiology followup.  Since last being seen in our clinic, the patient reports well overall. He fell this am, but this was the first in a very long time.  He had a recent knee surgery, and while recovering, fell and broke his hip, so has had a lot of problems with his arthritis.   he denies chest pain, palpitations, dyspnea, PND, orthopnea, nausea, vomiting, dizziness, syncope, edema, weight gain, or early satiety.  Device History: Medtronic Loop recorder PPM implanted 07/2017 for crytpogenic stroke  Past Medical History:  Diagnosis Date  . Allergic rhinitis   . Anticoagulant long-term use    xarelto  . Anxiety   . Benign localized prostatic hyperplasia with lower urinary tract symptoms (LUTS)   . Benzodiazepine dependence ([place])   . Edema of right lower extremity   . GERD (gastroesophageal reflux disease)   . Heart murmur   . Hematuria   . Hepatitis    History of  . History of colonic polyps   . History of CVA (cerebrovascular accident) without residual deficits 07/26/2017   crytogenic stroke -- acute left caudate body infarct   . History of kidney stones   . Hypertension   . Ischemic cardiomyopathy 07/26/2017   ef 35-40%;    TEE 07-31-2017 ef 60-65%  . MDD (major depressive disorder), recurrent, in partial remission ([place])   . OA (osteoarthritis)    knees,   . OSA (obstructive sleep apnea)    intolerant cpap  . PAF (paroxysmal atrial fibrillation) [facility])    cardiologist-  dr g. taylor  . Skin cancer    Childhood, right foot on toe  . Status post placement of implantable loop recorder 07/31/2017  . Stroke ([place])   . Swelling of right foot  12/2018  . Throat mass   . Wears glasses    Past Surgical History:  Procedure Laterality Date  . APPENDECTOMY  1970s  . CATARACT EXTRACTION W/ INTRAOCULAR LENS  IMPLANT, BILATERAL Bilateral 2014  . CHOLECYSTECTOMY  1970s  . COLONOSCOPY    . CYSTOSCOPY    . FEMUR IM NAIL Right 02/27/2019   Procedure: INTRAMEDULLARY (IM) NAIL FEMORAL;  Surgeon: Rod Can, MD;  Location: [facility];  Service: Orthopedics;  Laterality: Right;  . FOOT SURGERY Left yrs ago  . HERNIA REPAIR  2014  . LOOP RECORDER INSERTION N/A 07/31/2017   Procedure: LOOP RECORDER INSERTION;  Surgeon: Evans Lance, MD;  Location: [place] [facility];  Service: Cardiovascular;  Laterality: N/A;  . TEE WITHOUT CARDIOVERSION N/A 07/31/2017   Procedure: TRANSESOPHAGEAL ECHOCARDIOGRAM (TEE) WITH LOOP;  Surgeon: Dorothy Spark, MD;  Location: [place];  Service: Cardiovascular;  Laterality: N/A;  . TOTAL KNEE ARTHROPLASTY Left 02/22/2019   Procedure: TOTAL KNEE ARTHROPLASTY;  Surgeon: [name], MD;  Location: [facility];  Service: Orthopedics;  Laterality: Left;  . WRIST FRACTURE SURGERY  1970s   right x2  unsure if any hardware remain   ;    left x1 with pinning then removed    Current Outpatient Medications  Medication Sig Dispense Refill  . acetaminophen (TYLENOL) 325 MG tablet Take 325 mg by mouth every 6 (six) hours as needed for moderate  pain or headache.    . ALPRAZolam (XANAX) 1 MG tablet TAKE 0.5 TABLETS (0.5 MG TOTAL) BY MOUTH 3 (THREE) TIMES DAILY AS NEEDED FOR ANXIETY. 45 tablet 0  . amLODipine (NORVASC) 10 MG tablet TAKE 1 TABLET BY MOUTH EVERY DAY 90 tablet 3  . bisacodyl (DULCOLAX) 10 MG suppository Place 1 suppository (10 mg total) rectally daily as needed for moderate constipation. 12 suppository 0  . buPROPion (WELLBUTRIN SR) 150 MG 12 hr tablet TAKE 1 TABLET BY MOUTH TWICE A DAY (Patient taking differently: Take 150 mg by mouth 2 (two) times daily. ) 180 tablet 3  . carvedilol (COREG) 6.25 MG tablet  Take 1 tablet (6.25 mg total) by mouth 2 (two) times daily with a meal. 180 tablet 2  . dutasteride (AVODART) 0.5 MG capsule Take 1 capsule (0.5 mg total) by mouth daily. 90 capsule 3  . influenza vaccine adjuvanted (FLUAD QUADRIVALENT) 0.5 ML injection Fluad Quad 2020-2021(5yr up)(PF) 60 mcg (15 mcg x 4)/0.58mL IM syringe  PHARMACY ADMINISTERED    . Menthol, Topical Analgesic, (ICY HOT EX) Apply 1 application topically daily as needed (pain).    . mirtazapine (REMERON) 15 MG tablet TAKE 1 TABLET BY MOUTH EVERYDAY AT BEDTIME 30 tablet 11  . Multiple Vitamin (MULTIVITAMIN WITH MINERALS) TABS tablet Take 1 tablet by mouth daily.    Marland Kitchen Propylene Glycol (SYSTANE BALANCE) 0.6 % SOLN Place 1 drop into both eyes daily as needed (dry eyes).    . RESTASIS 0.05 % ophthalmic emulsion Place 1 drop 2 (two) times daily into both eyes.  4  . timolol (TIMOPTIC) 0.5 % ophthalmic solution Place 1 drop into both eyes 2 (two) times daily.   0  . valsartan (DIOVAN) 80 MG tablet Take 80 mg by mouth daily.    Alveda Reasons 20 MG TABS tablet TAKE 1 TAB BY MOUTH DAILY WITH SUPPER. OVERDUE FOR FOLLOW-UP, PT MUST MAKE APPT FOR FUTURE REFILLS. 30 tablet 1   No current facility-administered medications for this visit.     Allergies:   Antihistamines, diphenhydramine-type; Aspirin; Codeine; and Pollen extract   Social History: Social History   Socioeconomic History  . Marital status: Widowed    Spouse name: Not on file  . Number of children: 2  . Years of education: Not on file  . Highest education level: Not on file  Occupational History  . 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 Financial resource strain: Not on file  . Food insecurity    Worry: Not on file    Inability: Not on file  . Transportation needs    Medical: Not on file    Non-medical: Not on file  Tobacco Use  . Smoking status: Never Smoker  . Smokeless tobacco: Never Used  Substance and Sexual Activity  . Alcohol use:  No  . Drug use: Never  . Sexual activity: Not on file  Lifestyle  . Physical activity    Days per week: Not on file    Minutes per session: Not on file  . Stress: Not on file  Relationships  . Social Herbalist on phone: Not on file    Gets together: Not on file    Attends religious service: Not on file    Active member of club or organization: Not on file    Attends meetings of clubs or organizations: Not on file    Relationship status: Not on file  . Intimate partner violence  Fear of current or ex partner: Not on file    Emotionally abused: Not on file    Physically abused: Not on file    Forced sexual activity: Not on file  Other Topics Concern  . Not on file  Social History Narrative   Divorced twice---first wife died   2 children---daughter local, son in [place]      Has living will   [place] is daughter   Would accept resuscitation   No prolonged tube feeds if cognitively unaware    Family History: Family History  Problem Relation Age of Onset  . Heart disease Mother   . Heart disease Father   . Colon cancer Maternal Aunt 70     Review of Systems: All other systems reviewed and are otherwise negative except as noted above.  Physical Exam: Vitals:   08/12/19 0934  BP: (!) 150/68  Pulse: (!) 44  Weight: 198 lb (89.8 kg)  Height: 5\' 10"  (1.778 m)     GEN- The patient is well appearing, alert and oriented x 3 today.   HEENT: normocephalic, atraumatic; sclera clear, conjunctiva pink; hearing intact; oropharynx clear; neck supple  Lungs- Clear to ausculation bilaterally, normal work of breathing.  No wheezes, rales, rhonchi Heart- Regular rate and rhythm, no murmurs, rubs or gallops  GI- soft, non-tender, non-distended, bowel sounds present  Extremities- no clubbing, cyanosis, or edema  MS- no significant deformity or atrophy Skin- warm and dry, no rash or lesion; PPM pocket well healed Psych- euthymic mood, full affect Neuro-  strength and sensation are intact  PPM Interrogation- reviewed in detail today,  See PACEART report  EKG:  EKG is not ordered today. Device interrogation shows apparent NSR with PVCs today, AF burden 0.4%  Recent Labs: 06/17/2019: ALT 19; BUN 19; Creatinine, Ser 1.08; Hemoglobin 13.5; Platelets 206.0; Potassium 3.9; Sodium 140   Wt Readings from Last 3 Encounters:  08/12/19 198 lb (89.8 kg)  06/17/19 195 lb (88.5 kg)  04/30/19 191 lb (86.6 kg)     Other studies Reviewed: Additional studies/ records that were reviewed today include: TEE 07/2017 shows LVEF 60-65%, Previous EP office notes, Previous remote checks, Most recent labwork.   Assessment and Plan:  1. Atrial fibrillation with H/o cryptogenic stroke Normal ILR function See Pace Art report No changes today Continue Xarelto for CHA2DS2VASC of 5. He is worried about the cost, but not interested in switching to coumadin at this time.   2. Sleep apnea Intolerant to CPAP  3. HTN Continue current regimen with recent fall. He is to monitor at home and let us know if running > 140.   Current medicines are reviewed at length with the patient today.   The patient does not have concerns regarding his medicines.  The following changes were made today:  none  Labs/ tests ordered today include:  Orders Placed This Encounter  Procedures  . Basic metabolic panel  . CBC   Disposition:   Follow up with Dr. Lovena Le in 6 Months, sooner with symptoms.    Jacalyn Lefevre, PA-C  08/12/2019 10:21 AM  [facility] [address] [place] [address] [phone] (office) [phone] (fax)

## 2019-08-12 ENCOUNTER — Other Ambulatory Visit: Payer: Self-pay | Admitting: *Deleted

## 2019-08-12 ENCOUNTER — Ambulatory Visit (INDEPENDENT_AMBULATORY_CARE_PROVIDER_SITE_OTHER): Payer: PPO | Admitting: Student

## 2019-08-12 ENCOUNTER — Other Ambulatory Visit: Payer: Self-pay

## 2019-08-12 VITALS — BP 150/68 | HR 44 | Ht 70.0 in | Wt 198.0 lb

## 2019-08-12 DIAGNOSIS — I480 Paroxysmal atrial fibrillation: Secondary | ICD-10-CM | POA: Diagnosis not present

## 2019-08-12 DIAGNOSIS — I493 Ventricular premature depolarization: Secondary | ICD-10-CM | POA: Diagnosis not present

## 2019-08-12 DIAGNOSIS — G4733 Obstructive sleep apnea (adult) (pediatric): Secondary | ICD-10-CM

## 2019-08-12 LAB — BASIC METABOLIC PANEL
BUN/Creatinine Ratio: 16 (ref 10–24)
BUN: 20 mg/dL (ref 8–27)
CO2: 26 mmol/L (ref 20–29)
Calcium: 9.7 mg/dL (ref 8.6–10.2)
Chloride: 104 mmol/L (ref 96–106)
Creatinine, Ser: 1.25 mg/dL (ref 0.76–1.27)
GFR calc Af Amer: 64 mL/min/{1.73_m2} (ref >59)
GFR calc non Af Amer: 56 mL/min/{1.73_m2} — ABNORMAL LOW (ref >59)
Glucose: 85 mg/dL (ref 65–99)
Potassium: 4.2 mmol/L (ref 3.5–5.2)
Sodium: 141 mmol/L (ref 134–144)

## 2019-08-12 LAB — CBC
Hematocrit: 41.4 % (ref 37.5–51.0)
Hemoglobin: 13.9 g/dL (ref 13.0–17.7)
MCH: 30 pg (ref 26.6–33.0)
MCHC: 33.6 g/dL (ref 31.5–35.7)
MCV: 89 fL (ref 79–97)
Platelets: 205 10*3/uL (ref 150–450)
RBC: 4.64 x10E6/uL (ref 4.14–5.80)
RDW: 12.7 % (ref 11.6–15.4)
WBC: 7.1 10*3/uL (ref 3.4–10.8)

## 2019-08-12 LAB — CUP PACEART INCLINIC DEVICE CHECK
Date Time Interrogation Session: 20201123102038
Implantable Pulse Generator Implant Date: 20181112

## 2019-08-12 NOTE — Patient Instructions (Addendum)
Medication Instructions:  Your physician recommends that you continue on your current medications as directed. Please refer to the Current Medication list given to you today.   *If you need a refill on your cardiac medications before your next appointment, please call your pharmacy*  Lab Work:   [place]    If you have labs (blood work) drawn today and your tests are completely normal, you will receive your results only by: Marland Kitchen MyChart Message (if you have MyChart) OR . A paper copy in the mail If you have any lab test that is abnormal or we need to change your treatment, we will call you to review the results.  Testing/Procedures: NONE ORDERED  TODAY    Follow-Up: At [facility], you and your health needs are our priority.  As part of our continuing mission to provide you with exceptional heart care, we have created designated Provider Care Teams.  These Care Teams include your primary Cardiologist (physician) and Advanced Practice Providers (APPs -  Physician Assistants and Nurse Practitioners) who all work together to provide you with the care you need, when you need it.  Your next appointment:   6 month(s)  The format for your next appointment:   In Person  Provider:   You may see Dr. Lovena Le  or one of the following Advanced Practice Providers on your designated Care Team:    Chanetta Marshall, NP  Tommye Standard, PA-C  Legrand Como "Oda Kilts, [place]   Other Instructions

## 2019-08-13 DIAGNOSIS — Z8673 Personal history of transient ischemic attack (TIA), and cerebral infarction without residual deficits: Secondary | ICD-10-CM | POA: Diagnosis not present

## 2019-08-13 DIAGNOSIS — I480 Paroxysmal atrial fibrillation: Secondary | ICD-10-CM | POA: Diagnosis not present

## 2019-08-13 DIAGNOSIS — M1712 Unilateral primary osteoarthritis, left knee: Secondary | ICD-10-CM | POA: Diagnosis not present

## 2019-08-13 DIAGNOSIS — S72141D Displaced intertrochanteric fracture of right femur, subsequent encounter for closed fracture with routine healing: Secondary | ICD-10-CM | POA: Diagnosis not present

## 2019-08-13 DIAGNOSIS — R278 Other lack of coordination: Secondary | ICD-10-CM | POA: Diagnosis not present

## 2019-08-13 DIAGNOSIS — R2689 Other abnormalities of gait and mobility: Secondary | ICD-10-CM | POA: Diagnosis not present

## 2019-08-19 ENCOUNTER — Other Ambulatory Visit: Payer: Self-pay

## 2019-08-19 ENCOUNTER — Encounter: Payer: Self-pay | Admitting: Internal Medicine

## 2019-08-19 ENCOUNTER — Ambulatory Visit (INDEPENDENT_AMBULATORY_CARE_PROVIDER_SITE_OTHER): Payer: PPO | Admitting: Internal Medicine

## 2019-08-19 DIAGNOSIS — R1032 Left lower quadrant pain: Secondary | ICD-10-CM | POA: Diagnosis not present

## 2019-08-19 DIAGNOSIS — F3341 Major depressive disorder, recurrent, in partial remission: Secondary | ICD-10-CM | POA: Diagnosis not present

## 2019-08-19 MED ORDER — ALPRAZOLAM 1 MG PO TABS: 0.5000 mg | ORAL_TABLET | Freq: Three times a day (TID) | ORAL | 0 refills | Status: DC | PRN

## 2019-08-19 NOTE — Assessment & Plan Note (Signed)
Sounds like it is from constipation Discussed trying miralax daily

## 2019-08-19 NOTE — Progress Notes (Signed)
Subjective:    Patient ID: Jared [name], male    DOB: July 12, 1941, 78 y.o.   MRN: [id number]  HPI Here for follow up of mood issues  Still limited due to [place] Not able to go to church---"this is pretty bad" Gets anxious regularly---able to reach out to one of the widows from his church  Hasn't been able to get out much TKR and then hip fracture Is able to mow his yard though  Some degree of anhedonia (physical limitations and COVID have taken away all that he enjoys) Still grieving good friend who passed last year (had spent considerable time together)  Uses the alprazolam 1/2 bid usually No suicidal ideations No feelings of worthlessness, etc  Current Outpatient Medications on File Prior to Visit  Medication Sig Dispense Refill  . acetaminophen (TYLENOL) 325 MG tablet Take 325 mg by mouth every 6 (six) hours as needed for moderate pain or headache.    . ALPRAZolam (XANAX) 1 MG tablet TAKE 0.5 TABLETS (0.5 MG TOTAL) BY MOUTH 3 (THREE) TIMES DAILY AS NEEDED FOR ANXIETY. 45 tablet 0  . amLODipine (NORVASC) 10 MG tablet TAKE 1 TABLET BY MOUTH EVERY DAY 90 tablet 3  . buPROPion (WELLBUTRIN SR) 150 MG 12 hr tablet TAKE 1 TABLET BY MOUTH TWICE A DAY (Patient taking differently: Take 150 mg by mouth 2 (two) times daily. ) 180 tablet 3  . carvedilol (COREG) 6.25 MG tablet Take 1 tablet (6.25 mg total) by mouth 2 (two) times daily with a meal. 180 tablet 2  . mirtazapine (REMERON) 15 MG tablet TAKE 1 TABLET BY MOUTH EVERYDAY AT BEDTIME 30 tablet 11  . Propylene Glycol (SYSTANE BALANCE) 0.6 % SOLN Place 1 drop into both eyes daily as needed (dry eyes).    . RESTASIS 0.05 % ophthalmic emulsion Place 1 drop 2 (two) times daily into both eyes.  4  . timolol (TIMOPTIC) 0.5 % ophthalmic solution Place 1 drop into both eyes 2 (two) times daily.   0  . valsartan (DIOVAN) 80 MG tablet Take 80 mg by mouth daily.    Alveda Reasons 20 MG TABS tablet TAKE 1 TAB BY MOUTH DAILY WITH SUPPER. OVERDUE FOR  FOLLOW-UP, PT MUST MAKE APPT FOR FUTURE REFILLS. 30 tablet 1  . bisacodyl (DULCOLAX) 10 MG suppository Place 1 suppository (10 mg total) rectally daily as needed for moderate constipation. (Patient not taking: Reported on 08/19/2019) 12 suppository 0  . dutasteride (AVODART) 0.5 MG capsule Take 1 capsule (0.5 mg total) by mouth daily. 90 capsule 3   No current facility-administered medications on file prior to visit.     Allergies  Allergen Reactions  . Antihistamines, Diphenhydramine-Type     Blood in urine   . Aspirin     Blood in urine   . Codeine Nausea And Vomiting    Must take with food  . Pollen Extract Cough    Past Medical History:  Diagnosis Date  . Allergic rhinitis   . Anticoagulant long-term use    xarelto  . Anxiety   . Benign localized prostatic hyperplasia with lower urinary tract symptoms (LUTS)   . Benzodiazepine dependence ([place])   . Edema of right lower extremity   . GERD (gastroesophageal reflux disease)   . Heart murmur   . Hematuria   . Hepatitis    History of  . History of colonic polyps   . History of CVA (cerebrovascular accident) without residual deficits 07/26/2017   crytogenic stroke -- acute left  caudate body infarct   . History of kidney stones   . Hypertension   . Ischemic cardiomyopathy 07/26/2017   ef 35-40%;    TEE 07-31-2017 ef 60-65%  . MDD (major depressive disorder), recurrent, in partial remission ([place])   . OA (osteoarthritis)    knees,   . OSA (obstructive sleep apnea)    intolerant cpap  . PAF (paroxysmal atrial fibrillation) [facility])    cardiologist-  dr g. taylor  . Skin cancer    Childhood, right foot on toe  . Status post placement of implantable loop recorder 07/31/2017  . Stroke ([place])   . Swelling of right foot 12/2018  . Throat mass   . Wears glasses     Past Surgical History:  Procedure Laterality Date  . APPENDECTOMY  1970s  . CATARACT EXTRACTION W/ INTRAOCULAR LENS  IMPLANT, BILATERAL Bilateral 2014  .  CHOLECYSTECTOMY  1970s  . COLONOSCOPY    . CYSTOSCOPY    . FEMUR IM NAIL Right 02/27/2019   Procedure: INTRAMEDULLARY (IM) NAIL FEMORAL;  Surgeon: Rod Can, MD;  [facility];  Service: Orthopedics;  Laterality: Right;  . FOOT SURGERY Left yrs ago  . HERNIA REPAIR  2014  . LOOP RECORDER INSERTION N/A 07/31/2017   Procedure: LOOP RECORDER INSERTION;  Surgeon: Evans Lance, MD;  Location: [place] [facility];  Service: Cardiovascular;  Laterality: N/A;  . TEE WITHOUT CARDIOVERSION N/A 07/31/2017   Procedure: TRANSESOPHAGEAL ECHOCARDIOGRAM (TEE) WITH LOOP;  Surgeon: Dorothy Spark, MD;  Location: [place];  Service: Cardiovascular;  Laterality: N/A;  . TOTAL KNEE ARTHROPLASTY Left 02/22/2019   Procedure: TOTAL KNEE ARTHROPLASTY;  Surgeon: [name], MD;  [facility];  Service: Orthopedics;  Laterality: Left;  . WRIST FRACTURE SURGERY  1970s   right x2  unsure if any hardware remain   ;    left x1 with pinning then removed    Family History  Problem Relation Age of Onset  . Heart disease Mother   . Heart disease Father   . Colon cancer Maternal Aunt 70    Social History   Socioeconomic History  . Marital status: Widowed    Spouse name: Not on file  . Number of children: 2  . Years of education: Not on file  . Highest education level: Not on file  Occupational History  . 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 Financial resource strain: Not on file  . Food insecurity    Worry: Not on file    Inability: Not on file  . Transportation needs    Medical: Not on file    Non-medical: Not on file  Tobacco Use  . Smoking status: Never Smoker  . Smokeless tobacco: Never Used  Substance and Sexual Activity  . Alcohol use: No  . Drug use: Never  . Sexual activity: Not on file  Lifestyle  . Physical activity    Days per week: Not on file    Minutes per session: Not on file  . Stress: Not on file  Relationships  . Social  Herbalist on phone: Not on file    Gets together: Not on file    Attends religious service: Not on file    Active member of club or organization: Not on file    Attends meetings of clubs or organizations: Not on file    Relationship status: Not on file  . Intimate partner violence  Fear of current or ex partner: Not on file    Emotionally abused: Not on file    Physically abused: Not on file    Forced sexual activity: Not on file  Other Topics Concern  . Not on file  Social History Narrative   Divorced twice---first wife died   2 children---daughter local, son in [place]      Has living will   [place] is daughter   Would accept resuscitation   No prolonged tube feeds if cognitively unaware   Review of Systems Sleeps okay Appetite is variable---will "pig out" when he gets outside food Uses ibuprofen bid  Has some LLQ discomfort---wonders if this is related to constipation. Added raisin bran (?too much". Can feel "pocket of air" at times.  Bowels move every 3-4 days currently. Rarely takes stool softener and pill laxative     Objective:   Physical Exam  Constitutional: He appears well-developed. No distress.  GI: Soft. Bowel sounds are normal. He exhibits no distension. There is no abdominal tenderness. There is no rebound and no guarding.  Psychiatric:  Normal conversation No apparent depression Appearance is normal           Assessment & Plan:

## 2019-08-19 NOTE — Patient Instructions (Signed)
Please try miralax 1 capful in a full glass of water daily---and adjust as needed to go more regularly

## 2019-08-19 NOTE — Assessment & Plan Note (Signed)
Still with some anhedonia and mild depression--but no clear MDD worsening Will continue current medications

## 2019-08-20 DIAGNOSIS — S72141D Displaced intertrochanteric fracture of right femur, subsequent encounter for closed fracture with routine healing: Secondary | ICD-10-CM | POA: Diagnosis not present

## 2019-08-21 ENCOUNTER — Other Ambulatory Visit: Payer: Self-pay | Admitting: Internal Medicine

## 2019-08-21 NOTE — Telephone Encounter (Signed)
Pt last saw Barrington Ellison, [place] on 08/12/19, last labs Creat 1.25, age 78, weight 88.9kg, CrCl 63.22, based on CrCl pt is on appropriate dosage of Xarelto 20mg  QD.  Will refill rx.

## 2019-08-23 ENCOUNTER — Ambulatory Visit (INDEPENDENT_AMBULATORY_CARE_PROVIDER_SITE_OTHER): Payer: PPO | Admitting: *Deleted

## 2019-08-23 ENCOUNTER — Telehealth: Payer: Self-pay | Admitting: Internal Medicine

## 2019-08-23 DIAGNOSIS — I480 Paroxysmal atrial fibrillation: Secondary | ICD-10-CM | POA: Diagnosis not present

## 2019-08-23 NOTE — Telephone Encounter (Signed)
The ptv states that he is in the donut hole. I have given him Wynetta Emery and [facility] pt asst phone number to call with questions concerning asst and to request that they mail him an application. Also, He is aware that I am leaving him 1 bottle of Xarelto samples in the downstairs lobby to pick up.

## 2019-08-23 NOTE — Telephone Encounter (Signed)
Wyonia Hough, LPN, pt calling requesting samples of Xarelto. Pt states that he is in the donut hole and that his medication will cost $300. Could you please advise on this matter? Thanks

## 2019-08-24 LAB — CUP PACEART REMOTE DEVICE CHECK
Date Time Interrogation Session: 20201204102544
Implantable Pulse Generator Implant Date: 20181112

## 2019-09-03 ENCOUNTER — Other Ambulatory Visit: Payer: Self-pay | Admitting: Internal Medicine

## 2019-09-04 ENCOUNTER — Telehealth: Payer: Self-pay | Admitting: Internal Medicine

## 2019-09-04 ENCOUNTER — Other Ambulatory Visit: Payer: Self-pay | Admitting: Internal Medicine

## 2019-09-04 NOTE — Telephone Encounter (Signed)
Pt dropped off patient assistance form to be filled out. Placed in RX tower. °

## 2019-09-07 NOTE — Telephone Encounter (Signed)
Form filled out and placed in Dr Alla German inbox on his desk to sign.

## 2019-09-09 NOTE — Telephone Encounter (Signed)
Patient called back about the form he dropped off last week He stated that he is needing to come today to pick this up

## 2019-09-09 NOTE — Telephone Encounter (Signed)
Paperwork is up front and ready for pickup Patient is aware

## 2019-09-12 DIAGNOSIS — Z8673 Personal history of transient ischemic attack (TIA), and cerebral infarction without residual deficits: Secondary | ICD-10-CM | POA: Diagnosis not present

## 2019-09-12 DIAGNOSIS — R2689 Other abnormalities of gait and mobility: Secondary | ICD-10-CM | POA: Diagnosis not present

## 2019-09-12 DIAGNOSIS — S72141D Displaced intertrochanteric fracture of right femur, subsequent encounter for closed fracture with routine healing: Secondary | ICD-10-CM | POA: Diagnosis not present

## 2019-09-12 DIAGNOSIS — I480 Paroxysmal atrial fibrillation: Secondary | ICD-10-CM | POA: Diagnosis not present

## 2019-09-12 DIAGNOSIS — M1712 Unilateral primary osteoarthritis, left knee: Secondary | ICD-10-CM | POA: Diagnosis not present

## 2019-09-12 DIAGNOSIS — R278 Other lack of coordination: Secondary | ICD-10-CM | POA: Diagnosis not present

## 2019-09-16 NOTE — Telephone Encounter (Signed)
Pt brought Pt Asst application by our office and left it. It has already been completed and signed by his PCP Viviana Simpler, MD) I will not make any changes to the RX since the PCP has already signed it.  Application has been faxed to [facility] for determination.

## 2019-09-17 ENCOUNTER — Other Ambulatory Visit: Payer: Self-pay | Admitting: Internal Medicine

## 2019-09-17 NOTE — Telephone Encounter (Signed)
Last filled 08-19-19 #45 Last OV 08-19-19 Next OV 06-24-20 [facility]

## 2019-09-17 NOTE — Telephone Encounter (Signed)
He is calling in early every month. Find out how he is taking the medication

## 2019-09-23 NOTE — Telephone Encounter (Signed)
I left message for patient to return phone call.   

## 2019-09-25 ENCOUNTER — Ambulatory Visit (INDEPENDENT_AMBULATORY_CARE_PROVIDER_SITE_OTHER): Payer: Medicare HMO | Admitting: *Deleted

## 2019-09-25 DIAGNOSIS — I480 Paroxysmal atrial fibrillation: Secondary | ICD-10-CM

## 2019-09-26 LAB — CUP PACEART REMOTE DEVICE CHECK
Date Time Interrogation Session: 20210106102207
Implantable Pulse Generator Implant Date: 20181112

## 2019-09-30 ENCOUNTER — Ambulatory Visit (INDEPENDENT_AMBULATORY_CARE_PROVIDER_SITE_OTHER): Payer: Medicare HMO | Admitting: Internal Medicine

## 2019-09-30 ENCOUNTER — Other Ambulatory Visit: Payer: Self-pay

## 2019-09-30 ENCOUNTER — Encounter: Payer: Self-pay | Admitting: Internal Medicine

## 2019-09-30 DIAGNOSIS — L03313 Cellulitis of chest wall: Secondary | ICD-10-CM

## 2019-09-30 DIAGNOSIS — F1320 Sedative, hypnotic or anxiolytic dependence, uncomplicated: Secondary | ICD-10-CM | POA: Diagnosis not present

## 2019-09-30 MED ORDER — DOXYCYCLINE HYCLATE 100 MG PO TABS: 100.0000 mg | ORAL_TABLET | Freq: Two times a day (BID) | ORAL | 0 refills | Status: DC

## 2019-09-30 NOTE — Assessment & Plan Note (Signed)
Reviewed PDMP Has had some early refills----he does cut in half and take only 2-3 per day Last month was okay discussed

## 2019-09-30 NOTE — Progress Notes (Signed)
Subjective:    Patient ID: Jared [name], male    DOB: [birth year], 79 y.o.   MRN: [id number]  HPI Here due to chest tenderness  This visit occurred during the SARS-CoV-2 public health emergency.  Safety protocols were in place, including screening questions prior to the visit, additional usage of staff PPE, and extensive cleaning of exam room while observing appropriate contact time as indicated for disinfecting solutions.   Has had a "mole" on his chest for years  Seen by derm and no action needed Now red and painful for 4 days Very sore and swelled some No Rx  Current Outpatient Medications on File Prior to Visit  Medication Sig Dispense Refill  . acetaminophen (TYLENOL) 325 MG tablet Take 325 mg by mouth every 6 (six) hours as needed for moderate pain or headache.    . ALPRAZolam (XANAX) 1 MG tablet TAKE 0.5 TABLETS (0.5 MG TOTAL) BY MOUTH 3 (THREE) TIMES DAILY AS NEEDED FOR ANXIETY. 45 tablet 0  . amLODipine (NORVASC) 10 MG tablet TAKE 1 TABLET BY MOUTH EVERY DAY 90 tablet 3  . bisacodyl (DULCOLAX) 10 MG suppository Place 1 suppository (10 mg total) rectally daily as needed for moderate constipation. 12 suppository 0  . buPROPion (WELLBUTRIN SR) 150 MG 12 hr tablet TAKE 1 TABLET BY MOUTH TWICE A DAY (Patient taking differently: Take 150 mg by mouth 2 (two) times daily. ) 180 tablet 3  . carvedilol (COREG) 6.25 MG tablet TAKE 1 TABLET (6.25 MG TOTAL) BY MOUTH 2 (TWO) TIMES DAILY WITH A MEAL. 180 tablet 3  . dutasteride (AVODART) 0.5 MG capsule TAKE 1 CAPSULE BY MOUTH EVERY DAY 90 capsule 3  . mirtazapine (REMERON) 15 MG tablet TAKE 1 TABLET BY MOUTH EVERYDAY AT BEDTIME 30 tablet 11  . Propylene Glycol (SYSTANE BALANCE) 0.6 % SOLN Place 1 drop into both eyes daily as needed (dry eyes).    . RESTASIS 0.05 % ophthalmic emulsion Place 1 drop 2 (two) times daily into both eyes.  4  . timolol (TIMOPTIC) 0.5 % ophthalmic solution Place 1 drop into both eyes 2 (two) times daily.   0  .  valsartan (DIOVAN) 80 MG tablet TAKE 1 TABLET BY MOUTH EVERY DAY 90 tablet 3  . XARELTO 20 MG TABS tablet TAKE 1 TAB BY MOUTH DAILY WITH SUPPER. OVERDUE FOR FOLLOW-UP, PT MUST MAKE APPT FOR FUTURE REFILLS. 30 tablet 6   No current facility-administered medications on file prior to visit.    Allergies  Allergen Reactions  . Antihistamines, Diphenhydramine-Type     Blood in urine   . Aspirin     Blood in urine   . Codeine Nausea And Vomiting    Must take with food  . Pollen Extract Cough    Past Medical History:  Diagnosis Date  . Allergic rhinitis   . Anticoagulant long-term use    xarelto  . Anxiety   . Benign localized prostatic hyperplasia with lower urinary tract symptoms (LUTS)   . Benzodiazepine dependence ([place])   . Edema of right lower extremity   . GERD (gastroesophageal reflux disease)   . Heart murmur   . Hematuria   . Hepatitis    History of  . History of colonic polyps   . History of CVA (cerebrovascular accident) without residual deficits 07/26/2017   crytogenic stroke -- acute left caudate body infarct   . History of kidney stones   . Hypertension   . Ischemic cardiomyopathy 07/26/2017   ef 35-40%;  TEE 07-31-2017 ef 60-65%  . MDD (major depressive disorder), recurrent, in partial remission ([place])   . OA (osteoarthritis)    knees,   . OSA (obstructive sleep apnea)    intolerant cpap  . PAF (paroxysmal atrial fibrillation) [facility])    cardiologist-  dr g. taylor  . Skin cancer    Childhood, right foot on toe  . Status post placement of implantable loop recorder 07/31/2017  . Stroke ([place])   . Swelling of right foot 12/2018  . Throat mass   . Wears glasses     Past Surgical History:  Procedure Laterality Date  . APPENDECTOMY  1970s  . CATARACT EXTRACTION W/ INTRAOCULAR LENS  IMPLANT, BILATERAL Bilateral 2014  . CHOLECYSTECTOMY  1970s  . COLONOSCOPY    . CYSTOSCOPY    . FEMUR IM NAIL Right 02/27/2019   Procedure: INTRAMEDULLARY (IM) NAIL FEMORAL;   Surgeon: Rod Can, MD;  Location: [facility];  Service: Orthopedics;  Laterality: Right;  . FOOT SURGERY Left yrs ago  . HERNIA REPAIR  2014  . LOOP RECORDER INSERTION N/A 07/31/2017   Procedure: LOOP RECORDER INSERTION;  Surgeon: Evans Lance, MD;  Location: [place] [facility];  Service: Cardiovascular;  Laterality: N/A;  . TEE WITHOUT CARDIOVERSION N/A 07/31/2017   Procedure: TRANSESOPHAGEAL ECHOCARDIOGRAM (TEE) WITH LOOP;  Surgeon: Dorothy Spark, MD;  Location: [place];  Service: Cardiovascular;  Laterality: N/A;  . TOTAL KNEE ARTHROPLASTY Left 02/22/2019   Procedure: TOTAL KNEE ARTHROPLASTY;  Surgeon: [name], MD;  Location: [facility];  Service: Orthopedics;  Laterality: Left;  . WRIST FRACTURE SURGERY  1970s   right x2  unsure if any hardware remain   ;    left x1 with pinning then removed    Family History  Problem Relation Age of Onset  . Heart disease Mother   . Heart disease Father   . Colon cancer Maternal Aunt 70    Social History   Socioeconomic History  . Marital status: Widowed    Spouse name: Not on file  . Number of children: 2  . Years of education: Not on file  . Highest education level: Not on file  Occupational History  . Occupation: Nurse, learning disability    Comment: retired  Tobacco Use  . Smoking status: Never Smoker  . Smokeless tobacco: Never Used  Substance and Sexual Activity  . Alcohol use: No  . Drug use: Never  . Sexual activity: Not on file  Other Topics Concern  . Not on file  Social History Narrative   Divorced twice---first wife died   2 children---daughter local, son in [place]      Has living will   [name] is daughter   Would accept resuscitation   No prolonged tube feeds if cognitively unaware   Social Determinants of Health   Financial Resource Strain:   . Difficulty of Paying Living Expenses: Not on file  Food Insecurity:   . Worried About Charity fundraiser in the Last Year: Not on file  . Ran Out  of Food in the Last Year: Not on file  Transportation Needs:   . Lack of Transportation (Medical): Not on file  . Lack of Transportation (Non-Medical): Not on file  Physical Activity:   . Days of Exercise per Week: Not on file  . Minutes of Exercise per Session: Not on file  Stress:   . Feeling of Stress : Not on file  Social Connections:   . Frequency  of Communication with Friends and Family: Not on file  . Frequency of Social Gatherings with Friends and Family: Not on file  . Attends Religious Services: Not on file  . Active Member of Clubs or Organizations: Not on file  . Attends Archivist Meetings: Not on file  . Marital Status: Not on file  Intimate Partner Violence:   . Fear of Current or Ex-Partner: Not on file  . Emotionally Abused: Not on file  . Physically Abused: Not on file  . Sexually Abused: Not on file   Review of Systems No fever    Objective:   Physical Exam  Constitutional: He appears well-developed. No distress.  Skin:  37mm pustule on left chest--just lateral to lower sternum Redness, warmth and some tenderness noted           Assessment & Plan:

## 2019-09-30 NOTE — Telephone Encounter (Signed)
Pt being seen today. Dr Silvio Pate to address.

## 2019-09-30 NOTE — Assessment & Plan Note (Signed)
Appearance suspicious for possible MRSA--so will try doxy x 7 days Would change to keflex or augmentin if not improving in a couple of days

## 2019-10-07 ENCOUNTER — Other Ambulatory Visit: Payer: Self-pay

## 2019-10-07 ENCOUNTER — Ambulatory Visit (INDEPENDENT_AMBULATORY_CARE_PROVIDER_SITE_OTHER): Payer: Medicare HMO | Admitting: Internal Medicine

## 2019-10-07 ENCOUNTER — Telehealth: Payer: Self-pay

## 2019-10-07 ENCOUNTER — Encounter: Payer: Self-pay | Admitting: Internal Medicine

## 2019-10-07 DIAGNOSIS — L03313 Cellulitis of chest wall: Secondary | ICD-10-CM

## 2019-10-07 MED ORDER — CEFTRIAXONE SODIUM 1 G IJ SOLR
1.0000 g | Freq: Once | INTRAMUSCULAR | Status: AC
  Administered 2019-10-07: 17:00:00 1 g via INTRAMUSCULAR

## 2019-10-07 NOTE — Telephone Encounter (Signed)
[place] Night - Client TELEPHONE ADVICE RECORD [facility] Patient Name: Jared [name] Gender: Male DOB: 08-24-1941 Age: 79 Y 11 M 11 D Return Phone Number: [phone] (Primary) Address: City/State/Zip: [place] [address] Client [facility] Night - Client Client Site [place] Physician Viviana Simpler - MD Contact Type Call Who Is Calling Patient / Member / Family / Caregiver Call Type Triage / Clinical Relationship To Patient Self Return Phone Number [phone] (Primary) Chief Complaint Wound Infection Reason for Call Symptomatic / Request for [place] states that he had a cyst on his on his left side of chest by his heart that ruptured about a week ago. States that he is on his last dose of an antibiotic doxycycline hyclate. States that it is still draining some pus and blood. States it is now a wound, a little red and a little warm. States that he is also having slight hematuria. No fever. Translation No Nurse Assessment Nurse: Melina Modena, RN, Estill Bamberg Date/Time Eilene Ghazi Time): 10/07/2019 7:38:41 AM Confirm and document reason for call. If symptomatic, describe symptoms. ---Caller states he has been on ABX for ten days for a cyst. Caller states the drainage continues. Has the patient had close contact with a person known or suspected to have the novel coronavirus illness OR traveled / lives in area with major community spread (including international travel) in the last 14 days from the onset of symptoms? * If Asymptomatic, screen for exposure and travel within the last 14 days. ---No Does the patient have any new or worsening symptoms? ---Yes Will a triage be completed? ---Yes Related visit to physician within the last 2 weeks? ---Yes Does the PT have any chronic conditions? (i.e. diabetes, asthma, this includes High risk factors for pregnancy, etc.) ---No Is  this a behavioral health or substance abuse call? ---No Guidelines Guideline Title Affirmed Question Affirmed Notes Nurse Date/Time (Eastern Time) Wound Infection [1] Pus or cloudy fluid draining from wound AND [2] no fever [place], RN, Estill Bamberg 10/07/2019 7:40:40 AM Disp. Time Eilene Ghazi Time) Disposition Final User PLEASE NOTE: All timestamps contained within this report are represented as [place] Standard Time. CONFIDENTIALTY NOTICE: This fax transmission is intended only for the addressee. It contains information that is legally privileged, confidential or otherwise protected from use or disclosure. If you are not the intended recipient, you are strictly prohibited from reviewing, disclosing, copying using or disseminating any of this information or taking any action in reliance on or regarding this information. If you have received this fax in error, please notify us immediately by telephone so that we can arrange for its return to [place]. Phone: [phone], Toll-Free: [phone], Fax: [phone] Page: 2 of 2 Call Id: [id number] 10/07/2019 7:43:52 AM See PCP within 24 Hours Yes Melina Modena, RN, Estill Bamberg Caller Disagree/Comply Comply Caller Understands Yes PreDisposition Call Doctor Care Advice Given Per Guideline SEE PCP WITHIN 24 HOURS: WARM SOAKS OR LOCAL HEAT: ANTIBIOTIC OINTMENT: CALL BACK IF: * Fever occurs * You become worse. CARE ADVICE per Wound Infection (Adult) guideline. Comments User: Tristan Schroeder, RN Date/Time Eilene Ghazi Time): 10/07/2019 7:45:07 AM Caller needs an appointment to reevaluate his cyst Referrals REFERRED TO PCP OFFICE

## 2019-10-07 NOTE — Addendum Note (Signed)
Addended by: Jacqualin Combes on: 10/07/2019 04:58 PM   Modules accepted: Orders

## 2019-10-07 NOTE — Telephone Encounter (Signed)
Okay May need a different antibiotic

## 2019-10-07 NOTE — Progress Notes (Signed)
Subjective:    Patient ID: Jared [name], male    DOB: 24-Mar-1941, 79 y.o.   MRN: [id number]  HPI Here due to ongoing issues with infection on chest  This visit occurred during the SARS-CoV-2 public health emergency.  Safety protocols were in place, including screening questions prior to the visit, additional usage of staff PPE, and extensive cleaning of exam room while observing appropriate contact time as indicated for disinfecting solutions.   Since last week Taking the doxy--couldn't tell much difference in the cyst But was noticing some stinging at end of penis with stream and going more  Has woken with bleeding from the wound at night--last 2 morning Draining some--can detect an odor  Current Outpatient Medications on File Prior to Visit  Medication Sig Dispense Refill  . ALPRAZolam (XANAX) 1 MG tablet TAKE 0.5 TABLETS (0.5 MG TOTAL) BY MOUTH 3 (THREE) TIMES DAILY AS NEEDED FOR ANXIETY. 45 tablet 0  . amLODipine (NORVASC) 10 MG tablet TAKE 1 TABLET BY MOUTH EVERY DAY 90 tablet 3  . bisacodyl (DULCOLAX) 10 MG suppository Place 1 suppository (10 mg total) rectally daily as needed for moderate constipation. 12 suppository 0  . buPROPion (WELLBUTRIN SR) 150 MG 12 hr tablet TAKE 1 TABLET BY MOUTH TWICE A DAY (Patient taking differently: Take 150 mg by mouth 2 (two) times daily. ) 180 tablet 3  . carvedilol (COREG) 6.25 MG tablet TAKE 1 TABLET (6.25 MG TOTAL) BY MOUTH 2 (TWO) TIMES DAILY WITH A MEAL. 180 tablet 3  . doxycycline (VIBRA-TABS) 100 MG tablet Take 1 tablet (100 mg total) by mouth 2 (two) times daily. 14 tablet 0  . dutasteride (AVODART) 0.5 MG capsule TAKE 1 CAPSULE BY MOUTH EVERY DAY 90 capsule 3  . mirtazapine (REMERON) 15 MG tablet TAKE 1 TABLET BY MOUTH EVERYDAY AT BEDTIME 30 tablet 11  . Propylene Glycol (SYSTANE BALANCE) 0.6 % SOLN Place 1 drop into both eyes daily as needed (dry eyes).    . RESTASIS 0.05 % ophthalmic emulsion Place 1 drop 2 (two) times daily into both  eyes.  4  . timolol (TIMOPTIC) 0.5 % ophthalmic solution Place 1 drop into both eyes 2 (two) times daily.   0  . valsartan (DIOVAN) 80 MG tablet TAKE 1 TABLET BY MOUTH EVERY DAY 90 tablet 3  . XARELTO 20 MG TABS tablet TAKE 1 TAB BY MOUTH DAILY WITH SUPPER. OVERDUE FOR FOLLOW-UP, PT MUST MAKE APPT FOR FUTURE REFILLS. 30 tablet 6   No current facility-administered medications on file prior to visit.    Allergies  Allergen Reactions  . Antihistamines, Diphenhydramine-Type     Blood in urine   . Aspirin     Blood in urine   . Codeine Nausea And Vomiting    Must take with food  . Pollen Extract Cough    Past Medical History:  Diagnosis Date  . Allergic rhinitis   . Anticoagulant long-term use    xarelto  . Anxiety   . Benign localized prostatic hyperplasia with lower urinary tract symptoms (LUTS)   . Benzodiazepine dependence ([place])   . Edema of right lower extremity   . GERD (gastroesophageal reflux disease)   . Heart murmur   . Hematuria   . Hepatitis    History of  . History of colonic polyps   . History of CVA (cerebrovascular accident) without residual deficits 07/26/2017   crytogenic stroke -- acute left caudate body infarct   . History of kidney stones   .  Hypertension   . Ischemic cardiomyopathy 07/26/2017   ef 35-40%;    TEE 07-31-2017 ef 60-65%  . MDD (major depressive disorder), recurrent, in partial remission ([place])   . OA (osteoarthritis)    knees,   . OSA (obstructive sleep apnea)    intolerant cpap  . PAF (paroxysmal atrial fibrillation) [facility])    cardiologist-  dr g. taylor  . Skin cancer    Childhood, right foot on toe  . Status post placement of implantable loop recorder 07/31/2017  . Stroke ([place])   . Swelling of right foot 12/2018  . Throat mass   . Wears glasses     Past Surgical History:  Procedure Laterality Date  . APPENDECTOMY  1970s  . CATARACT EXTRACTION W/ INTRAOCULAR LENS  IMPLANT, BILATERAL Bilateral 2014  . CHOLECYSTECTOMY  1970s  .  COLONOSCOPY    . CYSTOSCOPY    . FEMUR IM NAIL Right 02/27/2019   Procedure: INTRAMEDULLARY (IM) NAIL FEMORAL;  Surgeon: Rod Can, MD;  [facility];  Service: Orthopedics;  Laterality: Right;  . FOOT SURGERY Left yrs ago  . HERNIA REPAIR  2014  . LOOP RECORDER INSERTION N/A 07/31/2017   Procedure: LOOP RECORDER INSERTION;  Surgeon: Evans Lance, MD;  Location: [place] [facility];  Service: Cardiovascular;  Laterality: N/A;  . TEE WITHOUT CARDIOVERSION N/A 07/31/2017   Procedure: TRANSESOPHAGEAL ECHOCARDIOGRAM (TEE) WITH LOOP;  Surgeon: Dorothy Spark, MD;  Location: [place];  Service: Cardiovascular;  Laterality: N/A;  . TOTAL KNEE ARTHROPLASTY Left 02/22/2019   Procedure: TOTAL KNEE ARTHROPLASTY;  Surgeon: [name], MD;  [facility];  Service: Orthopedics;  Laterality: Left;  . WRIST FRACTURE SURGERY  1970s   right x2  unsure if any hardware remain   ;    left x1 with pinning then removed    Family History  Problem Relation Age of Onset  . Heart disease Mother   . Heart disease Father   . Colon cancer Maternal Aunt 70    Social History   Socioeconomic History  . Marital status: Widowed    Spouse name: Not on file  . Number of children: 2  . Years of education: Not on file  . Highest education level: Not on file  Occupational History  . Occupation: Nurse, learning disability    Comment: retired  Tobacco Use  . Smoking status: Never Smoker  . Smokeless tobacco: Never Used  Substance and Sexual Activity  . Alcohol use: No  . Drug use: Never  . Sexual activity: Not on file  Other Topics Concern  . Not on file  Social History Narrative   Divorced twice---first wife died   2 children---daughter local, son in [place]      Has living will   [place] is daughter   Would accept resuscitation   No prolonged tube feeds if cognitively unaware   Social Determinants of Health   Financial Resource Strain:   . Difficulty of Paying Living  Expenses: Not on file  Food Insecurity:   . Worried About Charity fundraiser in the Last Year: Not on file  . Ran Out of Food in the Last Year: Not on file  Transportation Needs:   . Lack of Transportation (Medical): Not on file  . Lack of Transportation (Non-Medical): Not on file  Physical Activity:   . Days of Exercise per Week: Not on file  . Minutes of Exercise per Session: Not on file  Stress:   .  Feeling of Stress : Not on file  Social Connections:   . Frequency of Communication with Friends and Family: Not on file  . Frequency of Social Gatherings with Friends and Family: Not on file  . Attends Religious Services: Not on file  . Active Member of Clubs or Organizations: Not on file  . Attends Archivist Meetings: Not on file  . Marital Status: Not on file  Intimate Partner Violence:   . Fear of Current or Ex-Partner: Not on file  . Emotionally Abused: Not on file  . Physically Abused: Not on file  . Sexually Abused: Not on file   Review of Systems No fever No N/V--eating okay    Objective:   Physical Exam  Skin:  Markedly worse cyst on chest--inflamed Surrounding redness now ~6 x 2 cm in oval area below and medial to left nipple           Assessment & Plan:

## 2019-10-07 NOTE — Telephone Encounter (Signed)
Pt already has in office appt today at 3:45 with Dr Silvio Pate.

## 2019-10-07 NOTE — Assessment & Plan Note (Signed)
Worse despite the doxy Likely not MRSA then Will give rocephin today and plan to recheck in the morning

## 2019-10-08 ENCOUNTER — Encounter: Payer: Self-pay | Admitting: Internal Medicine

## 2019-10-08 ENCOUNTER — Ambulatory Visit (INDEPENDENT_AMBULATORY_CARE_PROVIDER_SITE_OTHER): Payer: Medicare HMO | Admitting: Internal Medicine

## 2019-10-08 DIAGNOSIS — L03313 Cellulitis of chest wall: Secondary | ICD-10-CM | POA: Diagnosis not present

## 2019-10-08 MED ORDER — CEFTRIAXONE SODIUM 1 G IJ SOLR
1.0000 g | Freq: Once | INTRAMUSCULAR | Status: AC
  Administered 2019-10-08: 1 g via INTRAMUSCULAR

## 2019-10-08 NOTE — Progress Notes (Signed)
Subjective:    Patient ID: Jared [name], male    DOB: Nov 08, 1940, 79 y.o.   MRN: [id number]  HPI Here for follow up of cellulitis of his chest wall  This visit occurred during the SARS-CoV-2 public health emergency.  Safety protocols were in place, including screening questions prior to the visit, additional usage of staff PPE, and extensive cleaning of exam room while observing appropriate contact time as indicated for disinfecting solutions.   No apparent problems with the rocephin shot Has slight discharge on shirt this morning He hasn't checked it today Still some pain at the infected site--but is able to bend down without it hurting so much  Current Outpatient Medications on File Prior to Visit  Medication Sig Dispense Refill  . ALPRAZolam (XANAX) 1 MG tablet TAKE 0.5 TABLETS (0.5 MG TOTAL) BY MOUTH 3 (THREE) TIMES DAILY AS NEEDED FOR ANXIETY. 45 tablet 0  . amLODipine (NORVASC) 10 MG tablet TAKE 1 TABLET BY MOUTH EVERY DAY 90 tablet 3  . bisacodyl (DULCOLAX) 10 MG suppository Place 1 suppository (10 mg total) rectally daily as needed for moderate constipation. 12 suppository 0  . buPROPion (WELLBUTRIN SR) 150 MG 12 hr tablet TAKE 1 TABLET BY MOUTH TWICE A DAY (Patient taking differently: Take 150 mg by mouth 2 (two) times daily. ) 180 tablet 3  . carvedilol (COREG) 6.25 MG tablet TAKE 1 TABLET (6.25 MG TOTAL) BY MOUTH 2 (TWO) TIMES DAILY WITH A MEAL. 180 tablet 3  . doxycycline (VIBRA-TABS) 100 MG tablet Take 1 tablet (100 mg total) by mouth 2 (two) times daily. 14 tablet 0  . dutasteride (AVODART) 0.5 MG capsule TAKE 1 CAPSULE BY MOUTH EVERY DAY 90 capsule 3  . mirtazapine (REMERON) 15 MG tablet TAKE 1 TABLET BY MOUTH EVERYDAY AT BEDTIME 30 tablet 11  . Propylene Glycol (SYSTANE BALANCE) 0.6 % SOLN Place 1 drop into both eyes daily as needed (dry eyes).    . RESTASIS 0.05 % ophthalmic emulsion Place 1 drop 2 (two) times daily into both eyes.  4  . timolol (TIMOPTIC) 0.5 % ophthalmic  solution Place 1 drop into both eyes 2 (two) times daily.   0  . valsartan (DIOVAN) 80 MG tablet TAKE 1 TABLET BY MOUTH EVERY DAY 90 tablet 3  . XARELTO 20 MG TABS tablet TAKE 1 TAB BY MOUTH DAILY WITH SUPPER. OVERDUE FOR FOLLOW-UP, PT MUST MAKE APPT FOR FUTURE REFILLS. 30 tablet 6   No current facility-administered medications on file prior to visit.    Allergies  Allergen Reactions  . Antihistamines, Diphenhydramine-Type     Blood in urine   . Aspirin     Blood in urine   . Codeine Nausea And Vomiting    Must take with food  . Pollen Extract Cough    Past Medical History:  Diagnosis Date  . Allergic rhinitis   . Anticoagulant long-term use    xarelto  . Anxiety   . Benign localized prostatic hyperplasia with lower urinary tract symptoms (LUTS)   . Benzodiazepine dependence ([place])   . Edema of right lower extremity   . GERD (gastroesophageal reflux disease)   . Heart murmur   . Hematuria   . Hepatitis    History of  . History of colonic polyps   . History of CVA (cerebrovascular accident) without residual deficits 07/26/2017   crytogenic stroke -- acute left caudate body infarct   . History of kidney stones   . Hypertension   . Ischemic  cardiomyopathy 07/26/2017   ef 35-40%;    TEE 07-31-2017 ef 60-65%  . MDD (major depressive disorder), recurrent, in partial remission ([place])   . OA (osteoarthritis)    knees,   . OSA (obstructive sleep apnea)    intolerant cpap  . PAF (paroxysmal atrial fibrillation) [facility])    cardiologist-  dr g. taylor  . Skin cancer    Childhood, right foot on toe  . Status post placement of implantable loop recorder 07/31/2017  . Stroke ([place])   . Swelling of right foot 12/2018  . Throat mass   . Wears glasses     Past Surgical History:  Procedure Laterality Date  . APPENDECTOMY  1970s  . CATARACT EXTRACTION W/ INTRAOCULAR LENS  IMPLANT, BILATERAL Bilateral 2014  . CHOLECYSTECTOMY  1970s  . COLONOSCOPY    . CYSTOSCOPY    . FEMUR IM NAIL  Right 02/27/2019   Procedure: INTRAMEDULLARY (IM) NAIL FEMORAL;  Surgeon: Rod Can, MD;  [facility]: [facility];  Service: Orthopedics;  Laterality: Right;  . FOOT SURGERY Left yrs ago  . HERNIA REPAIR  2014  . LOOP RECORDER INSERTION N/A 07/31/2017   Procedure: LOOP RECORDER INSERTION;  Surgeon: Evans Lance, MD;  Location: [place] [facility];  Service: Cardiovascular;  Laterality: N/A;  . TEE WITHOUT CARDIOVERSION N/A 07/31/2017   Procedure: TRANSESOPHAGEAL ECHOCARDIOGRAM (TEE) WITH LOOP;  Surgeon: Dorothy Spark, MD;  Location: [place];  Service: Cardiovascular;  Laterality: N/A;  . TOTAL KNEE ARTHROPLASTY Left 02/22/2019   Procedure: TOTAL KNEE ARTHROPLASTY;  Surgeon: [name], MD;  [facility];  Service: Orthopedics;  Laterality: Left;  . WRIST FRACTURE SURGERY  1970s   right x2  unsure if any hardware remain   ;    left x1 with pinning then removed    Family History  Problem Relation Age of Onset  . Heart disease Mother   . Heart disease Father   . Colon cancer Maternal Aunt 70    Social History   Socioeconomic History  . Marital status: Widowed    Spouse name: Not on file  . Number of children: 2  . Years of education: Not on file  . Highest education level: Not on file  Occupational History  . Occupation: Nurse, learning disability    Comment: retired  Tobacco Use  . Smoking status: Never Smoker  . Smokeless tobacco: Never Used  Substance and Sexual Activity  . Alcohol use: No  . Drug use: Never  . Sexual activity: Not on file  Other Topics Concern  . Not on file  Social History Narrative   Divorced twice---first wife died   2 children---daughter local, son in [place]      Has living will   [place] is daughter   Would accept resuscitation   No prolonged tube feeds if cognitively unaware   Social Determinants of Health   Financial Resource Strain:   . Difficulty of Paying Living Expenses: Not on file  Food Insecurity:   . Worried  About Charity fundraiser in the Last Year: Not on file  . Ran Out of Food in the Last Year: Not on file  Transportation Needs:   . Lack of Transportation (Medical): Not on file  . Lack of Transportation (Non-Medical): Not on file  Physical Activity:   . Days of Exercise per Week: Not on file  . Minutes of Exercise per Session: Not on file  Stress:   . Feeling of Stress : Not  on file  Social Connections:   . Frequency of Communication with Friends and Family: Not on file  . Frequency of Social Gatherings with Friends and Family: Not on file  . Attends Religious Services: Not on file  . Active Member of Clubs or Organizations: Not on file  . Attends Archivist Meetings: Not on file  . Marital Status: Not on file  Intimate Partner Violence:   . Fear of Current or Ex-Partner: Not on file  . Emotionally Abused: Not on file  . Physically Abused: Not on file  . Sexually Abused: Not on file    Review of Systems No fever Still eating okay    Objective:   Physical Exam  Skin:  Mild discharge on dressing Still indurated mass at open area--marked inflammation at center but area of redness is less           Assessment & Plan:

## 2019-10-08 NOTE — Assessment & Plan Note (Signed)
Improved but still marked inflammation and induration Will repeat ceftriaxone 1gm today---and see again tomorrow Clean large bandage put on--with neosporin topically

## 2019-10-08 NOTE — Addendum Note (Signed)
Addended by: Brenton Grills on: [id number] PM   Modules accepted: Orders

## 2019-10-09 ENCOUNTER — Encounter: Payer: Self-pay | Admitting: Internal Medicine

## 2019-10-09 ENCOUNTER — Other Ambulatory Visit: Payer: Self-pay

## 2019-10-09 ENCOUNTER — Ambulatory Visit (INDEPENDENT_AMBULATORY_CARE_PROVIDER_SITE_OTHER): Payer: Medicare HMO | Admitting: Internal Medicine

## 2019-10-09 DIAGNOSIS — L03313 Cellulitis of chest wall: Secondary | ICD-10-CM | POA: Diagnosis not present

## 2019-10-09 MED ORDER — CEFTRIAXONE SODIUM 1 G IJ SOLR
1.0000 g | Freq: Once | INTRAMUSCULAR | Status: AC
  Administered 2019-10-09: 12:00:00 1 g via INTRAMUSCULAR

## 2019-10-09 MED ORDER — AMOXICILLIN-POT CLAVULANATE 875-125 MG PO TABS: 1.0000 | ORAL_TABLET | Freq: Two times a day (BID) | ORAL | 0 refills | Status: DC

## 2019-10-09 NOTE — Progress Notes (Signed)
Subjective:    Patient ID: Jared [name], male    DOB: 08-22-1941, 79 y.o.   MRN: [id number]  HPI Here for follow up of cellulitis of chest wall--in apparent cyst  This visit occurred during the SARS-CoV-2 public health emergency.  Safety protocols were in place, including screening questions prior to the visit, additional usage of staff PPE, and extensive cleaning of exam room while observing appropriate contact time as indicated for disinfecting solutions.   No problems with the rocephin shot Changed the dressing today due to bleeding Still has a sharp "sting" over the site that will move over to the right side Will actually "take my breath away"  Current Outpatient Medications on File Prior to Visit  Medication Sig Dispense Refill  . ALPRAZolam (XANAX) 1 MG tablet TAKE 0.5 TABLETS (0.5 MG TOTAL) BY MOUTH 3 (THREE) TIMES DAILY AS NEEDED FOR ANXIETY. 45 tablet 0  . amLODipine (NORVASC) 10 MG tablet TAKE 1 TABLET BY MOUTH EVERY DAY 90 tablet 3  . bisacodyl (DULCOLAX) 10 MG suppository Place 1 suppository (10 mg total) rectally daily as needed for moderate constipation. 12 suppository 0  . buPROPion (WELLBUTRIN SR) 150 MG 12 hr tablet TAKE 1 TABLET BY MOUTH TWICE A DAY (Patient taking differently: Take 150 mg by mouth 2 (two) times daily. ) 180 tablet 3  . carvedilol (COREG) 6.25 MG tablet TAKE 1 TABLET (6.25 MG TOTAL) BY MOUTH 2 (TWO) TIMES DAILY WITH A MEAL. 180 tablet 3  . dutasteride (AVODART) 0.5 MG capsule TAKE 1 CAPSULE BY MOUTH EVERY DAY 90 capsule 3  . mirtazapine (REMERON) 15 MG tablet TAKE 1 TABLET BY MOUTH EVERYDAY AT BEDTIME 30 tablet 11  . Propylene Glycol (SYSTANE BALANCE) 0.6 % SOLN Place 1 drop into both eyes daily as needed (dry eyes).    . RESTASIS 0.05 % ophthalmic emulsion Place 1 drop 2 (two) times daily into both eyes.  4  . timolol (TIMOPTIC) 0.5 % ophthalmic solution Place 1 drop into both eyes 2 (two) times daily.   0  . valsartan (DIOVAN) 80 MG tablet TAKE 1 TABLET  BY MOUTH EVERY DAY 90 tablet 3  . XARELTO 20 MG TABS tablet TAKE 1 TAB BY MOUTH DAILY WITH SUPPER. OVERDUE FOR FOLLOW-UP, PT MUST MAKE APPT FOR FUTURE REFILLS. 30 tablet 6   No current facility-administered medications on file prior to visit.    Allergies  Allergen Reactions  . Antihistamines, Diphenhydramine-Type     Blood in urine   . Aspirin     Blood in urine   . Codeine Nausea And Vomiting    Must take with food  . Pollen Extract Cough    Past Medical History:  Diagnosis Date  . Allergic rhinitis   . Anticoagulant long-term use    xarelto  . Anxiety   . Benign localized prostatic hyperplasia with lower urinary tract symptoms (LUTS)   . Benzodiazepine dependence ([place])   . Edema of right lower extremity   . GERD (gastroesophageal reflux disease)   . Heart murmur   . Hematuria   . Hepatitis    History of  . History of colonic polyps   . History of CVA (cerebrovascular accident) without residual deficits 07/26/2017   crytogenic stroke -- acute left caudate body infarct   . History of kidney stones   . Hypertension   . Ischemic cardiomyopathy 07/26/2017   ef 35-40%;    TEE 07-31-2017 ef 60-65%  . MDD (major depressive disorder), recurrent, in partial  remission ([place])   . OA (osteoarthritis)    knees,   . OSA (obstructive sleep apnea)    intolerant cpap  . PAF (paroxysmal atrial fibrillation) [facility])    cardiologist-  dr g. taylor  . Skin cancer    Childhood, right foot on toe  . Status post placement of implantable loop recorder 07/31/2017  . Stroke ([place])   . Swelling of right foot 12/2018  . Throat mass   . Wears glasses     Past Surgical History:  Procedure Laterality Date  . APPENDECTOMY  1970s  . CATARACT EXTRACTION W/ INTRAOCULAR LENS  IMPLANT, BILATERAL Bilateral 2014  . CHOLECYSTECTOMY  1970s  . COLONOSCOPY    . CYSTOSCOPY    . FEMUR IM NAIL Right 02/27/2019   Procedure: INTRAMEDULLARY (IM) NAIL FEMORAL;  Surgeon: Rod Can, MD;  [facility];  Service: Orthopedics;  Laterality: Right;  . FOOT SURGERY Left yrs ago  . HERNIA REPAIR  2014  . LOOP RECORDER INSERTION N/A 07/31/2017   Procedure: LOOP RECORDER INSERTION;  Surgeon: Evans Lance, MD;  Location: [place] [facility];  Service: Cardiovascular;  Laterality: N/A;  . TEE WITHOUT CARDIOVERSION N/A 07/31/2017   Procedure: TRANSESOPHAGEAL ECHOCARDIOGRAM (TEE) WITH LOOP;  Surgeon: Dorothy Spark, MD;  Location: [place];  Service: Cardiovascular;  Laterality: N/A;  . TOTAL KNEE ARTHROPLASTY Left 02/22/2019   Procedure: TOTAL KNEE ARTHROPLASTY;  Surgeon: [name], MD;  [facility];  Service: Orthopedics;  Laterality: Left;  . WRIST FRACTURE SURGERY  1970s   right x2  unsure if any hardware remain   ;    left x1 with pinning then removed    Family History  Problem Relation Age of Onset  . Heart disease Mother   . Heart disease Father   . Colon cancer Maternal Aunt 70    Social History   Socioeconomic History  . Marital status: Widowed    Spouse name: Not on file  . Number of children: 2  . Years of education: Not on file  . Highest education level: Not on file  Occupational History  . Occupation: Nurse, learning disability    Comment: retired  Tobacco Use  . Smoking status: Never Smoker  . Smokeless tobacco: Never Used  Substance and Sexual Activity  . Alcohol use: No  . Drug use: Never  . Sexual activity: Not on file  Other Topics Concern  . Not on file  Social History Narrative   Divorced twice---first wife died   2 children---daughter local, son in [place]      Has living will   [place] is daughter   Would accept resuscitation   No prolonged tube feeds if cognitively unaware   Social Determinants of Health   Financial Resource Strain:   . Difficulty of Paying Living Expenses: Not on file  Food Insecurity:   . Worried About Charity fundraiser in the Last Year: Not on file  . Ran Out of Food in the Last Year: Not on file   Transportation Needs:   . Lack of Transportation (Medical): Not on file  . Lack of Transportation (Non-Medical): Not on file  Physical Activity:   . Days of Exercise per Week: Not on file  . Minutes of Exercise per Session: Not on file  Stress:   . Feeling of Stress : Not on file  Social Connections:   . Frequency of Communication with Friends and Family: Not on file  . Frequency of  Social Gatherings with Friends and Family: Not on file  . Attends Religious Services: Not on file  . Active Member of Clubs or Organizations: Not on file  . Attends Archivist Meetings: Not on file  . Marital Status: Not on file  Intimate Partner Violence:   . Fear of Current or Ex-Partner: Not on file  . Emotionally Abused: Not on file  . Physically Abused: Not on file  . Sexually Abused: Not on file   Review of Systems No fever No vomiting or diarrhea    Objective:   Physical Exam  Constitutional: He appears well-developed. No distress.  Skin:  Cyst opened as I took the bandage off and about 15cc of bloody pus came out Surrounding redness is slightly less Chest area is now flat (cyst empty)           Assessment & Plan:

## 2019-10-09 NOTE — Addendum Note (Signed)
Addended by: Brenton Grills on: [id number] AM   Modules accepted: Orders

## 2019-10-09 NOTE — Assessment & Plan Note (Signed)
The induration is gone now after drainage--it wasn't clear there was pus because it was so hard----but told him this means he is closing in on healing Will give rocephin again Oral augmentin now Follow up prn only

## 2019-10-13 DIAGNOSIS — Z8673 Personal history of transient ischemic attack (TIA), and cerebral infarction without residual deficits: Secondary | ICD-10-CM | POA: Diagnosis not present

## 2019-10-13 DIAGNOSIS — R2689 Other abnormalities of gait and mobility: Secondary | ICD-10-CM | POA: Diagnosis not present

## 2019-10-13 DIAGNOSIS — S72141D Displaced intertrochanteric fracture of right femur, subsequent encounter for closed fracture with routine healing: Secondary | ICD-10-CM | POA: Diagnosis not present

## 2019-10-13 DIAGNOSIS — R278 Other lack of coordination: Secondary | ICD-10-CM | POA: Diagnosis not present

## 2019-10-13 DIAGNOSIS — M1712 Unilateral primary osteoarthritis, left knee: Secondary | ICD-10-CM | POA: Diagnosis not present

## 2019-10-13 DIAGNOSIS — I480 Paroxysmal atrial fibrillation: Secondary | ICD-10-CM | POA: Diagnosis not present

## 2019-10-28 ENCOUNTER — Ambulatory Visit (INDEPENDENT_AMBULATORY_CARE_PROVIDER_SITE_OTHER)
Admission: RE | Admit: 2019-10-28 | Discharge: 2019-10-28 | Disposition: A | Discharge status: Home / Self Care | Payer: Medicare HMO | Source: Ambulatory Visit | Attending: Family Medicine | Admitting: Family Medicine

## 2019-10-28 ENCOUNTER — Encounter: Payer: Self-pay | Admitting: Family Medicine

## 2019-10-28 ENCOUNTER — Other Ambulatory Visit: Payer: Self-pay

## 2019-10-28 ENCOUNTER — Ambulatory Visit (INDEPENDENT_AMBULATORY_CARE_PROVIDER_SITE_OTHER): Payer: Medicare HMO | Admitting: *Deleted

## 2019-10-28 ENCOUNTER — Ambulatory Visit (INDEPENDENT_AMBULATORY_CARE_PROVIDER_SITE_OTHER): Payer: Medicare HMO | Admitting: Family Medicine

## 2019-10-28 VITALS — BP 138/66 | HR 47 | Temp 96.6°F | Ht 70.0 in | Wt 198.1 lb

## 2019-10-28 DIAGNOSIS — R109 Unspecified abdominal pain: Secondary | ICD-10-CM | POA: Diagnosis not present

## 2019-10-28 DIAGNOSIS — R8290 Unspecified abnormal findings in urine: Secondary | ICD-10-CM | POA: Diagnosis not present

## 2019-10-28 DIAGNOSIS — I480 Paroxysmal atrial fibrillation: Secondary | ICD-10-CM

## 2019-10-28 LAB — CUP PACEART REMOTE DEVICE CHECK
Date Time Interrogation Session: 20210208000928
Implantable Pulse Generator Implant Date: 20181112

## 2019-10-28 LAB — POC URINALSYSI DIPSTICK (AUTOMATED)
Bilirubin, UA: NEGATIVE
Glucose, UA: NEGATIVE
Nitrite, UA: NEGATIVE
Protein, UA: POSITIVE — AB
Spec Grav, UA: 1.025 (ref 1.010–1.025)
Urobilinogen, UA: 0.2 E.U./dL
pH, UA: 6 (ref 5.0–8.0)

## 2019-10-28 NOTE — Progress Notes (Signed)
ILR Remote 

## 2019-10-28 NOTE — Patient Instructions (Signed)
Go to the lab on the way out.   If you have mychart we'll likely use that to update you about your xray.  If you can't give a urine sample then take a sterile container home.  We'll go from there.  Take care.  Glad to see you.

## 2019-10-28 NOTE — Progress Notes (Signed)
This visit occurred during the SARS-CoV-2 public health emergency.  Safety protocols were in place, including screening questions prior to the visit, additional usage of staff PPE, and extensive cleaning of exam room while observing appropriate contact time as indicated for disinfecting solutions.  He had baseline bradycardia.  He isn't lightheaded.    Prev cellulitis resolved.   On xarelto and finasteride at baseline.  10/25/2019 with rusty colored urine. Persisted AM 10/26/2019. Intermittent since then, usually more in the AMs.    H/o renal stones with some L lower abd soreness.  No recent passed stone.  No pain with urination.  He has urinary urgency at baseline, for years. No dysuria o/w.  No FCNAVD.  No other bleeding elsewhere.    Some recent constipation.  No blood in stools.  No pain now.   Meds, vitals, and allergies reviewed.   ROS: Per HPI unless specifically indicated in ROS section   GEN: nad, alert and oriented HEENT: ncat NECK: supple w/o LA CV: rrr.  PULM: ctab, no inc wob ABD: soft, +bs EXT: no edema SKIN: well perfused.

## 2019-10-29 ENCOUNTER — Other Ambulatory Visit: Payer: Self-pay | Admitting: Internal Medicine

## 2019-10-29 ENCOUNTER — Other Ambulatory Visit: Payer: Self-pay

## 2019-10-29 LAB — URINE CULTURE
MICRO NUMBER:: 10127354
SPECIMEN QUALITY:: ADEQUATE

## 2019-10-29 MED ORDER — VALSARTAN 80 MG PO TABS: 80.0000 mg | ORAL_TABLET | Freq: Every day | ORAL | 3 refills | Status: DC

## 2019-10-29 MED ORDER — AMLODIPINE BESYLATE 10 MG PO TABS: 10.0000 mg | ORAL_TABLET | Freq: Every day | ORAL | 3 refills | Status: AC

## 2019-10-29 NOTE — Telephone Encounter (Signed)
Last filled 09-17-19 #45 Last OV 09-30-19 Next OV 06-18-20 [facility]

## 2019-10-30 DIAGNOSIS — R109 Unspecified abdominal pain: Secondary | ICD-10-CM | POA: Insufficient documentation

## 2019-10-30 DIAGNOSIS — N200 Calculus of kidney: Secondary | ICD-10-CM | POA: Diagnosis not present

## 2019-10-30 DIAGNOSIS — R310 Gross hematuria: Secondary | ICD-10-CM | POA: Diagnosis not present

## 2019-10-30 NOTE — Assessment & Plan Note (Signed)
The concern would be for recurrent kidney stones causing symptoms for the patient.  This point still okay for outpatient follow-up.  See notes on urinalysis, urine culture, and KUB.

## 2019-10-31 IMAGING — CR DG HIP (WITH OR WITHOUT PELVIS) 2-3V RIGHT
3 series · 3 of 3 positions shown · non-contrast
Comparison: None.

CLINICAL DATA: Pain

EXAM:
DG HIP (WITH OR WITHOUT PELVIS) 2-3V RIGHT

[pelvis ap]
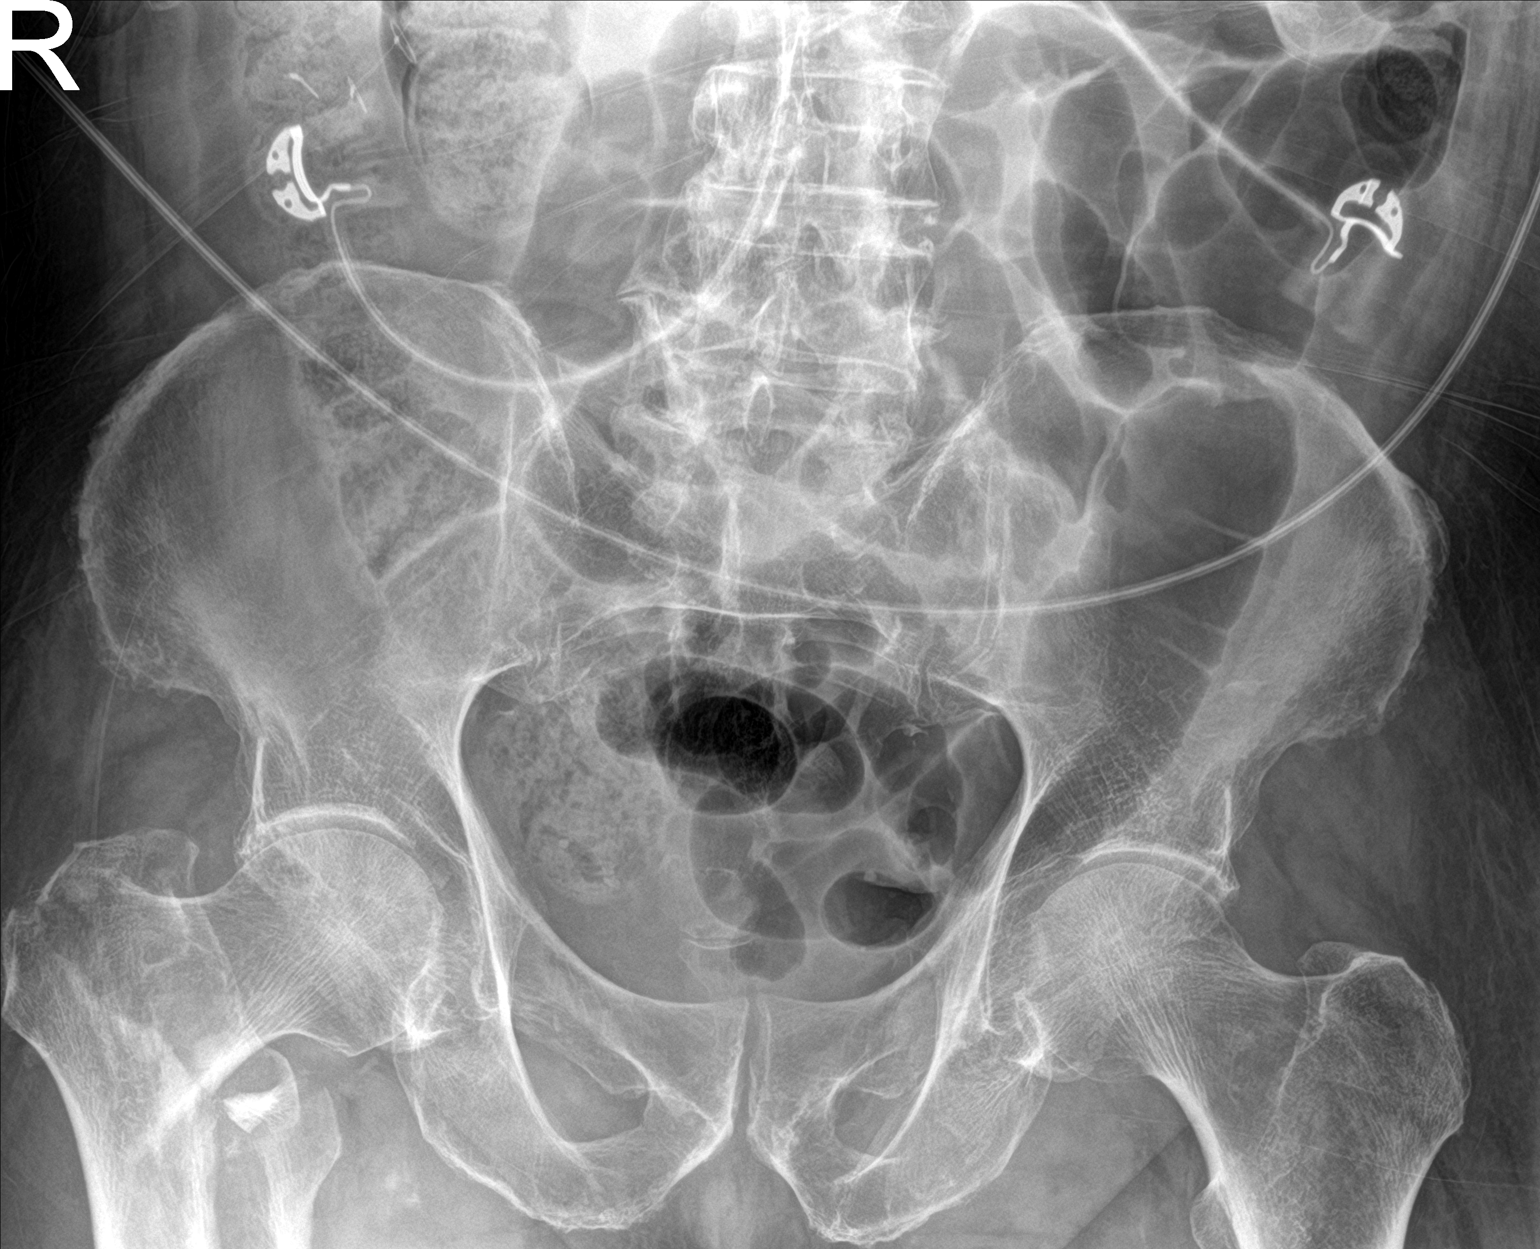

[hip ap]
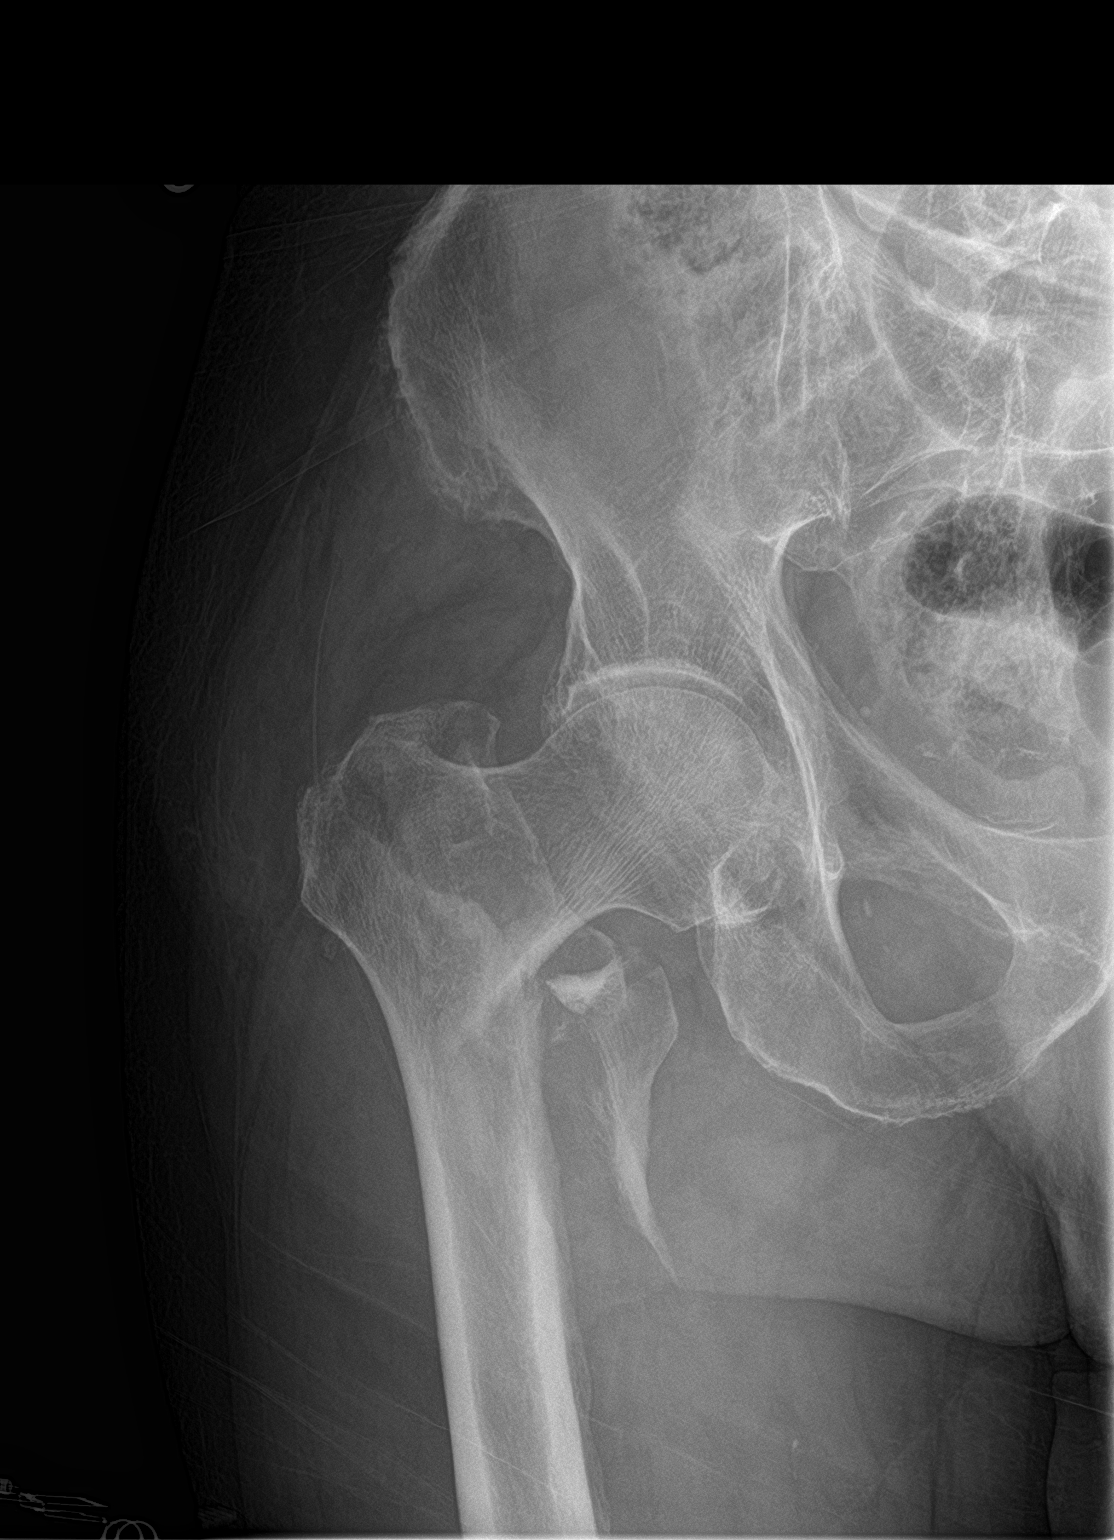

[hip lat]
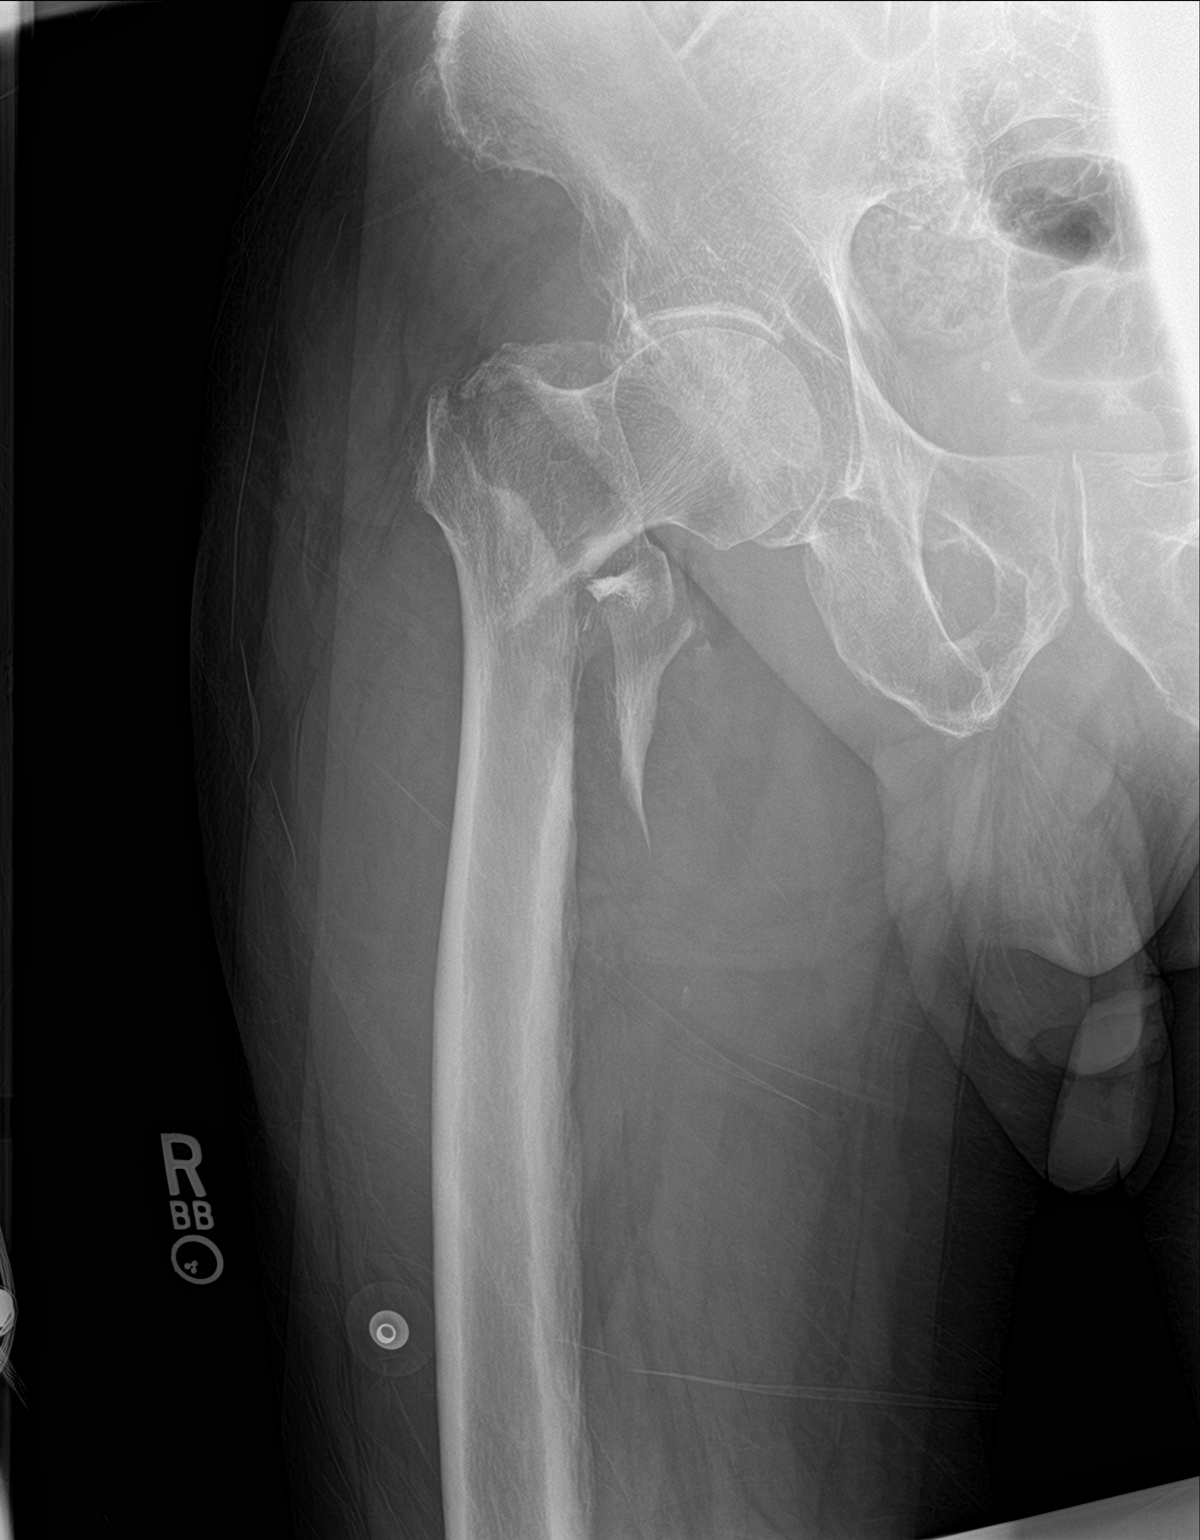

[3 of 3 positions shown; findings below may reference images not displayed]

FINDINGS: There is an acute displaced intratrochanteric fracture of the
proximal right fume ir. There is a large medially displaced fracture
fragment. There is no dislocation. There is osteopenia. There are
mild degenerative changes of both femoroacetabular joints.
IMPRESSION: Acute comminuted displaced intratrochanteric fracture of the
proximal right femur.

## 2019-10-31 IMAGING — CT CT CERVICAL SPINE WITHOUT CONTRAST
5 of 8 series · 11 of 33 positions shown, 12 images · non-contrast
Comparison: None.

CLINICAL DATA: Head trauma.

EXAM:
CT HEAD WITHOUT CONTRAST
CT CERVICAL SPINE WITHOUT CONTRAST
TECHNIQUE: Multidetector CT imaging of the head and cervical spine was
performed following the standard protocol without intravenous
contrast. Multiplanar CT image reconstructions of the cervical spine
were also generated.

[Series 5: head bone · axial · 0.43mm/px · z∈[-132,-72]mm · 2 of 92 slices shown]
[im 31/92  bone]
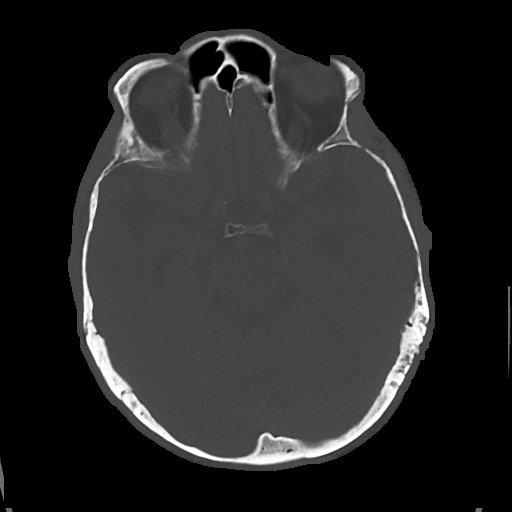
[im 61/92  bone]
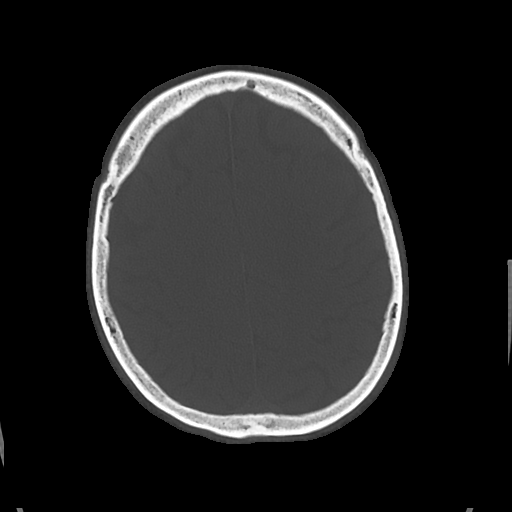

[Series 9: c spine soft · axial · 0.33mm/px · z∈[-264,-204]mm · 2 of 90 slices shown]
[im 30/90  soft-tissue]
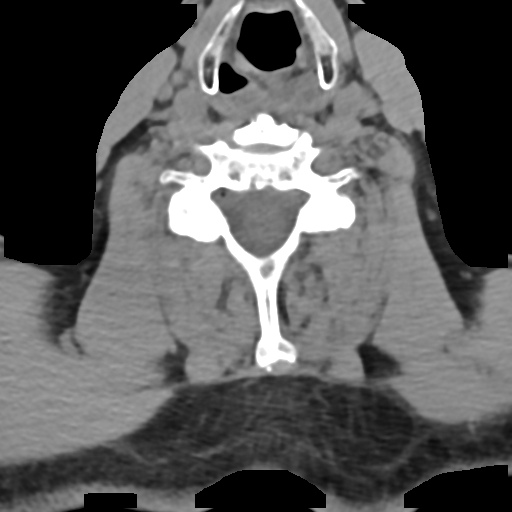
[im 60/90  soft-tissue]
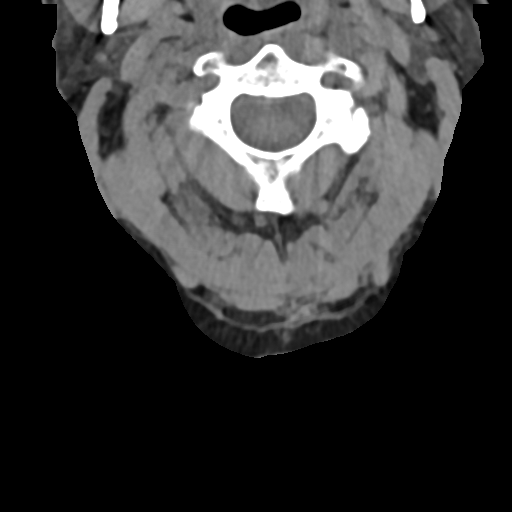

[Series 10: sag bone · sagittal · 0.26mm/px · 4 of 56 slices shown]
[im 12/56  bone]
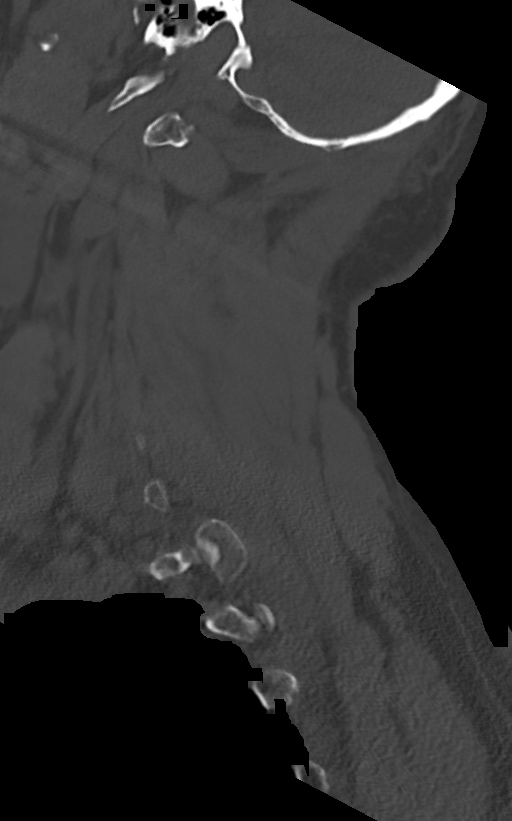
[im 23/56  bone]
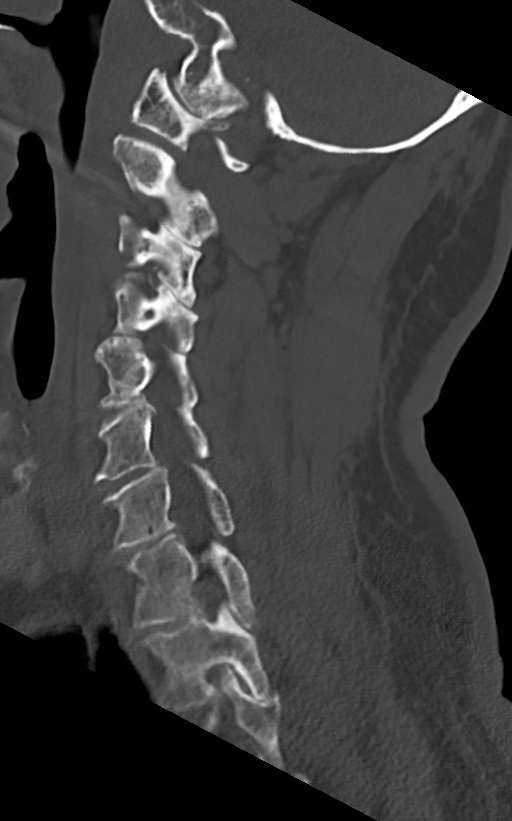
[im 34/56  bone]
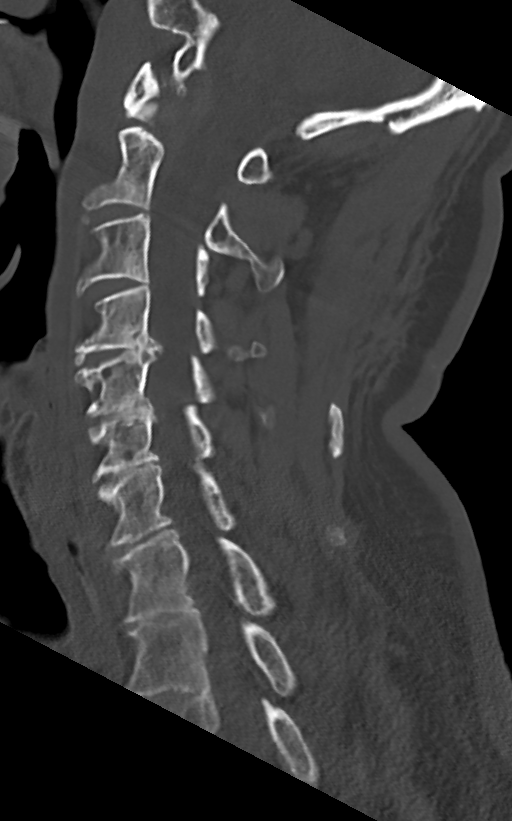
[im 45/56  bone]
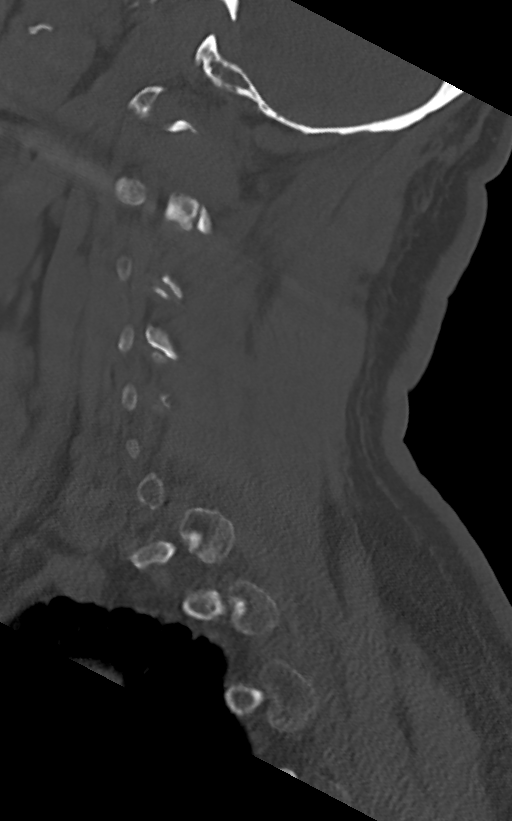

[Series 11: cor bone · coronal · 0.25mm/px · 1 of 61 slices shown]
[im 31/61  bone]
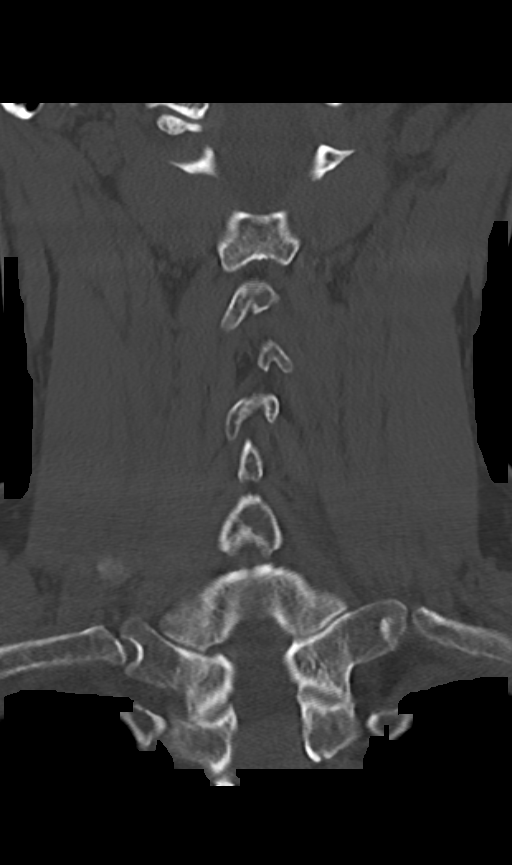

[Series 13: orthogonal axials · axial · 0.21mm/px · z∈[-294,-235]mm · 2 of 92 slices shown, 3 images]
[im 31/92  soft-tissue]
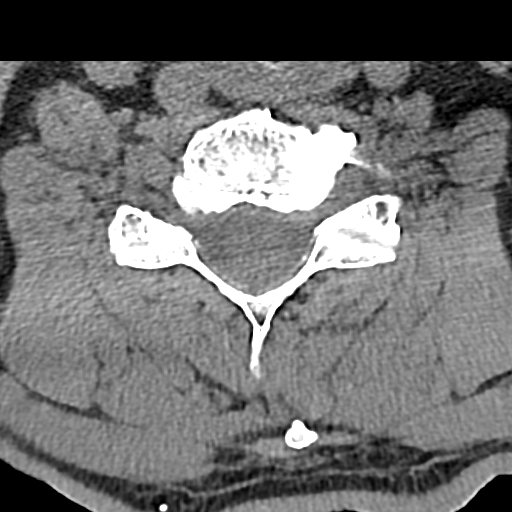
[im 31/92  bone]
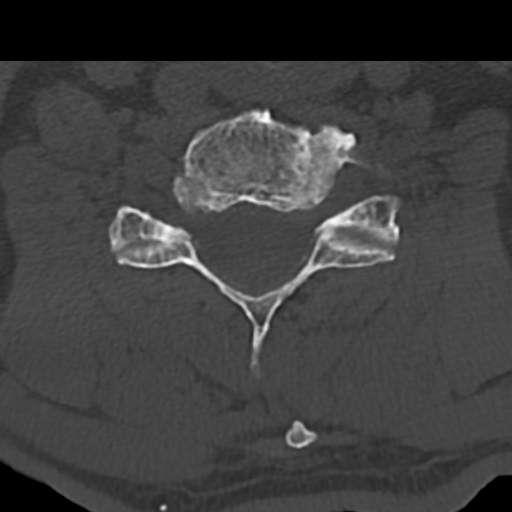
[im 61/92  bone]
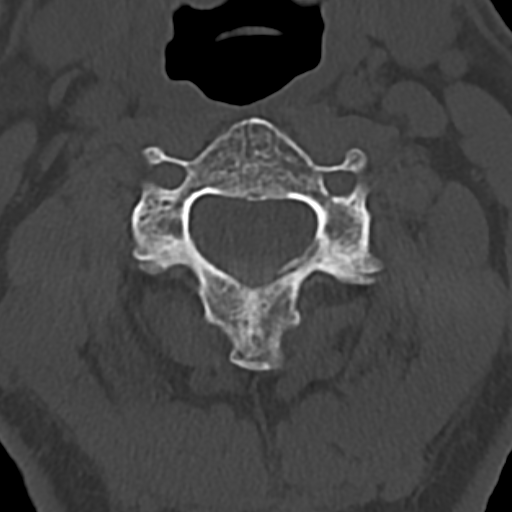

[11 of 33 positions shown; findings below may reference images not displayed]

FINDINGS: CT HEAD FINDINGS

Brain: No evidence of acute infarction, hemorrhage, hydrocephalus,
extra-axial collection or mass lesion/mass effect. There is
symmetric volume loss which is greater than expected for the
patient's age. There are chronic microvascular ischemic changes
bilaterally.

Vascular: No hyperdense vessel or unexpected calcification.

Skull: Normal. Negative for fracture or focal lesion.

Sinuses/Orbits: There is maxillary mucosal thickening bilaterally.
There is ethmoid air cell mucosal thickening bilaterally. The
remaining paranasal sinuses and mastoid air cells are essentially
clear. The patient is status post bilateral cataract surgery.

Other: None.

CT CERVICAL SPINE FINDINGS

Alignment: Normal.

Skull base and vertebrae: No acute fracture. No primary bone lesion
or focal pathologic process.

Soft tissues and spinal canal: No prevertebral fluid or swelling. No
visible canal hematoma.

Disc levels: There is mild multilevel disc height loss noted
throughout the cervical spine greatest at the C6-C7 and C7-T1
levels.

Upper chest: Negative.

Other: None
IMPRESSION: 1. No acute intracranial abnormality detected.
2. No cervical spine fracture.
3. Moderate volume loss, greater than expected for the patient's
age. There are chronic microvascular ischemic changes.

## 2019-11-05 ENCOUNTER — Other Ambulatory Visit: Payer: Self-pay

## 2019-11-05 MED ORDER — CARVEDILOL 6.25 MG PO TABS: 6.2500 mg | ORAL_TABLET | Freq: Two times a day (BID) | ORAL | 3 refills | Status: DC

## 2019-11-05 MED ORDER — ALPRAZOLAM 1 MG PO TABS: 0.5000 mg | ORAL_TABLET | Freq: Three times a day (TID) | ORAL | 2 refills | Status: DC | PRN

## 2019-11-05 MED ORDER — BUPROPION HCL ER (SR) 150 MG PO TB12: 150.0000 mg | ORAL_TABLET | Freq: Two times a day (BID) | ORAL | 3 refills | Status: DC

## 2019-11-05 NOTE — Telephone Encounter (Signed)
[facility] mail order pharmacy left v/m requesting status of faxed med refills request on 10/28/19 for carvedilol,bupropion and alprazolam.Please advise.

## 2019-11-05 NOTE — Telephone Encounter (Signed)
I never received any faxes. I sent the bupropion and carvedilol. Will have to send to Dr Silvio Pate to approve alprazolam. Mail order is only accepting 30 day supplies of controlled substances. It will have to be written for 1 month and 2 refills.

## 2019-11-11 DIAGNOSIS — R1032 Left lower quadrant pain: Secondary | ICD-10-CM | POA: Diagnosis not present

## 2019-11-11 DIAGNOSIS — R310 Gross hematuria: Secondary | ICD-10-CM | POA: Diagnosis not present

## 2019-11-11 DIAGNOSIS — R1084 Generalized abdominal pain: Secondary | ICD-10-CM | POA: Diagnosis not present

## 2019-11-11 DIAGNOSIS — N200 Calculus of kidney: Secondary | ICD-10-CM | POA: Diagnosis not present

## 2019-11-13 DIAGNOSIS — R2689 Other abnormalities of gait and mobility: Secondary | ICD-10-CM | POA: Diagnosis not present

## 2019-11-13 DIAGNOSIS — Z8673 Personal history of transient ischemic attack (TIA), and cerebral infarction without residual deficits: Secondary | ICD-10-CM | POA: Diagnosis not present

## 2019-11-13 DIAGNOSIS — M1712 Unilateral primary osteoarthritis, left knee: Secondary | ICD-10-CM | POA: Diagnosis not present

## 2019-11-13 DIAGNOSIS — R278 Other lack of coordination: Secondary | ICD-10-CM | POA: Diagnosis not present

## 2019-11-13 DIAGNOSIS — S72141D Displaced intertrochanteric fracture of right femur, subsequent encounter for closed fracture with routine healing: Secondary | ICD-10-CM | POA: Diagnosis not present

## 2019-11-13 DIAGNOSIS — I480 Paroxysmal atrial fibrillation: Secondary | ICD-10-CM | POA: Diagnosis not present

## 2019-11-15 ENCOUNTER — Ambulatory Visit: Payer: Medicare HMO

## 2019-11-17 ENCOUNTER — Ambulatory Visit: Payer: Medicare HMO | Attending: Internal Medicine

## 2019-11-17 DIAGNOSIS — Z23 Encounter for immunization: Secondary | ICD-10-CM | POA: Insufficient documentation

## 2019-11-17 NOTE — Progress Notes (Signed)
   Covid-19 Vaccination Clinic  Name:  Jared [name]    MRN: [id number] DOB: October 11, 1940  11/17/2019  [name] was observed post Covid-19 immunization for 15 minutes without incidence. He was provided with Vaccine Information Sheet and instruction to access the V-Safe system.   [name] was instructed to call 911 with any severe reactions post vaccine: Marland Kitchen Difficulty breathing  . Swelling of your face and throat  . A fast heartbeat  . A bad rash all over your body  . Dizziness and weakness    Immunizations Administered    Name Date Dose VIS Date Route   Pfizer COVID-19 Vaccine 11/17/2019  8:58 AM 0.3 mL 08/30/2019 Intramuscular   Manufacturer: [place]   Lot: [id number]   [place]: [id number]

## 2019-11-20 ENCOUNTER — Telehealth: Payer: Self-pay | Admitting: Internal Medicine

## 2019-11-20 DIAGNOSIS — R310 Gross hematuria: Secondary | ICD-10-CM | POA: Diagnosis not present

## 2019-11-20 NOTE — Telephone Encounter (Signed)
Clinical pharmacist to review Xarelto. H/o PAF and cryptogenic stroke

## 2019-11-20 NOTE — Telephone Encounter (Signed)
Patient with diagnosis of atrial fibrillation on Xarelto for anticoagulation.    Procedure: Left percutaneous nephrolithotomy Date of procedure: TBD  CHADS2-VASc score of 6 (CHF, HTN, AGEx2, stroke/tia x 2)  CrCl 62.8 ml/min Platelet count 205  Patient is at elevated risk of stroke off of anticoagulation given history of stroke. Request is to hold Xarelto for 2-3 days. Will route to MD for input.

## 2019-11-20 NOTE — Telephone Encounter (Signed)
New message      [facility] Pre-operative Risk Assessment    Request for surgical clearance:  1. What type of surgery is being performed? Left Percutaneous nephrolithotomy  2. When is this surgery scheduled? TBD  3. What type of clearance is required (medical clearance vs. Pharmacy clearance to hold med vs. Both)? Both  4. Are there any medications that need to be held prior to surgery and how long? xarelto 2-3 days prior to procedure  5. Practice name and name of physician performing surgery? [facility], Dr. Kathie Rhodes  6. What is your office phone number [phone] ext [phone]   7.   What is your office fax number? [phone]   8.   Anesthesia type (None, local, MAC, general) ? general   Jared [name] 11/20/2019, 1:58 PM  _________________________________________________________________   (provider comments below)

## 2019-11-22 DIAGNOSIS — R300 Dysuria: Secondary | ICD-10-CM | POA: Diagnosis not present

## 2019-11-22 DIAGNOSIS — R309 Painful micturition, unspecified: Secondary | ICD-10-CM | POA: Diagnosis not present

## 2019-11-22 DIAGNOSIS — R310 Gross hematuria: Secondary | ICD-10-CM | POA: Diagnosis not present

## 2019-11-22 NOTE — Telephone Encounter (Signed)
Ok to hold Xarelto for 2-3 days. Restart when ok with surgeon who has performed the surgery. GT

## 2019-11-22 NOTE — Telephone Encounter (Addendum)
   Primary Cardiologist: [name], MD  Chart reviewed as part of pre-operative protocol coverage. Patient was contacted 11/22/2019 in reference to pre-operative risk assessment for pending surgery as outlined below.  Jared [name] was last seen on 08/12/2019 by Oda Kilts, PA-C.  Since that day, Jared [name] has done well from a cardiac standpoint. He has been limited in mobility after sustaining a hip fracture last year. That being said, he can still complete 4 METs without anginal complaints. He denies recent palpitations.  Therefore, based on ACC/AHA guidelines, the patient would be at acceptable risk for the planned procedure without further cardiovascular testing.   Per Dr. Lovena Le, patient can hold xarelto 2-3 days prior to his upcoming procedure and should restart as soon as he is cleared to do so by Dr. Karsten Ro.  I will route this recommendation to the requesting party via Epic fax function and remove from pre-op pool.  Please call with questions.  Abigail Butts, PA-C 11/22/2019, 10:06 AM

## 2019-11-26 ENCOUNTER — Other Ambulatory Visit (HOSPITAL_COMMUNITY): Payer: Self-pay | Admitting: Urology

## 2019-11-26 ENCOUNTER — Other Ambulatory Visit: Payer: Self-pay | Admitting: Urology

## 2019-11-26 DIAGNOSIS — N200 Calculus of kidney: Secondary | ICD-10-CM

## 2019-11-28 ENCOUNTER — Ambulatory Visit (INDEPENDENT_AMBULATORY_CARE_PROVIDER_SITE_OTHER): Payer: Medicare HMO | Admitting: *Deleted

## 2019-11-28 DIAGNOSIS — I480 Paroxysmal atrial fibrillation: Secondary | ICD-10-CM | POA: Diagnosis not present

## 2019-11-28 LAB — CUP PACEART REMOTE DEVICE CHECK
Date Time Interrogation Session: 20210311005226
Implantable Pulse Generator Implant Date: 20181112

## 2019-11-28 NOTE — Progress Notes (Signed)
ILR Remote 

## 2019-12-10 ENCOUNTER — Encounter (HOSPITAL_COMMUNITY): Payer: Self-pay

## 2019-12-10 NOTE — Patient Instructions (Addendum)
DUE TO COVID-19 ONLY TWO VISITORS ARE ALLOWED TO COME WITH YOU AND STAY IN THE WAITING ROOM ONLY DURING PRE OP AND PROCEDURE. THE TWO VISITORS MAY VISIT WITH YOU IN YOUR PRIVATE ROOM DURING VISITING HOURS ONLY!!   COVID SWAB TESTING MUST BE COMPLETED ON: Friday, December 13, 2019 at 11:05 AM [address], [place] -Former [facility] enter pre surgical testing line (Must self quarantine after testing. Follow instructions on handout.)             Your procedure is scheduled on: Tuesday, December 17, 2019   Report to [facility] Main  Entrance    Report to admitting at 7:45 AM   Call this number if you have problems the morning of surgery [phone]   Do not eat food or drink liquids :After Midnight.  Oral Hygiene is also important to reduce your risk of infection.                                    Remember - BRUSH YOUR TEETH THE MORNING OF SURGERY WITH YOUR REGULAR TOOTHPASTE   Do NOT smoke after Midnight   Take these medicines the morning of surgery with A SIP OF WATER: Amlodipine, Bupropion, Dutasteride, Alprazalom if needed   May use eyedrops day of surgery                               You may not have any metal on your body including jewelry, and body piercings             Do not wear lotions, powders, perfumes/cologne, or deodorant                          Men may shave face and neck.   Do not bring valuables to the hospital. [facility].   Contacts, dentures or bridgework may not be worn into surgery.   Bring small overnight bag day of surgery.    Patients discharged the day of surgery will not be allowed to drive home.   Special Instructions: Bring a copy of your healthcare power of attorney and living will documents         the day of surgery if you haven't scanned them in before.              Please read over the following fact sheets you were given:  [facility] - Preparing for Surgery Before  surgery, you can play an important role.  Because skin is not sterile, your skin needs to be as free of germs as possible.  You can reduce the number of germs on your skin by washing with CHG (chlorahexidine gluconate) soap before surgery.  CHG is an antiseptic cleaner which kills germs and bonds with the skin to continue killing germs even after washing. Please DO NOT use if you have an allergy to CHG or antibacterial soaps.  If your skin becomes reddened/irritated stop using the CHG and inform your nurse when you arrive at Short Stay. Do not shave (including legs and underarms) for at least 48 hours prior to the first CHG shower.  You may shave your face/neck.  Please follow these instructions carefully:  1.  Shower with CHG Soap the night before surgery and the  morning of surgery.  2.  If you choose to wash your hair, wash your hair first as usual with your normal  shampoo.  3.  After you shampoo, rinse your hair and body thoroughly to remove the shampoo.                             4.  Use CHG as you would any other liquid soap.  You can apply chg directly to the skin and wash.  Gently with a scrungie or clean washcloth.  5.  Apply the CHG Soap to your body ONLY FROM THE NECK DOWN.   Do   not use on face/ open                           Wound or open sores. Avoid contact with eyes, ears mouth and   genitals (private parts).                       Wash face,  Genitals (private parts) with your normal soap.             6.  Wash thoroughly, paying special attention to the area where your    surgery  will be performed.  7.  Thoroughly rinse your body with warm water from the neck down.  8.  DO NOT shower/wash with your normal soap after using and rinsing off the CHG Soap.                9.  Pat yourself dry with a clean towel.            10.  Wear clean pajamas.            11.  Place clean sheets on your bed the night of your first shower and do not  sleep with pets. Day of Surgery : Do not apply  any lotions/deodorants the morning of surgery.  Please wear clean clothes to the hospital/surgery center.  FAILURE TO FOLLOW THESE INSTRUCTIONS MAY RESULT IN THE CANCELLATION OF YOUR SURGERY  PATIENT SIGNATURE_________________________________  NURSE SIGNATURE__________________________________  ________________________________________________________________________

## 2019-12-11 ENCOUNTER — Ambulatory Visit: Payer: Medicare HMO | Attending: Internal Medicine

## 2019-12-11 ENCOUNTER — Encounter (HOSPITAL_COMMUNITY): Payer: Medicare HMO

## 2019-12-11 ENCOUNTER — Encounter (HOSPITAL_COMMUNITY): Payer: Self-pay

## 2019-12-11 ENCOUNTER — Other Ambulatory Visit: Payer: Self-pay

## 2019-12-11 ENCOUNTER — Encounter (HOSPITAL_COMMUNITY)
Admission: RE | Admit: 2019-12-11 | Discharge: 2019-12-11 | Disposition: A | Discharge status: Home / Self Care | Payer: Medicare HMO | Source: Ambulatory Visit | Attending: Urology | Admitting: Urology

## 2019-12-11 DIAGNOSIS — I480 Paroxysmal atrial fibrillation: Secondary | ICD-10-CM | POA: Diagnosis not present

## 2019-12-11 DIAGNOSIS — R2689 Other abnormalities of gait and mobility: Secondary | ICD-10-CM | POA: Diagnosis not present

## 2019-12-11 DIAGNOSIS — M1712 Unilateral primary osteoarthritis, left knee: Secondary | ICD-10-CM | POA: Diagnosis not present

## 2019-12-11 DIAGNOSIS — Z8673 Personal history of transient ischemic attack (TIA), and cerebral infarction without residual deficits: Secondary | ICD-10-CM | POA: Diagnosis not present

## 2019-12-11 DIAGNOSIS — R278 Other lack of coordination: Secondary | ICD-10-CM | POA: Diagnosis not present

## 2019-12-11 DIAGNOSIS — Z23 Encounter for immunization: Secondary | ICD-10-CM

## 2019-12-11 DIAGNOSIS — S72141D Displaced intertrochanteric fracture of right femur, subsequent encounter for closed fracture with routine healing: Secondary | ICD-10-CM | POA: Diagnosis not present

## 2019-12-11 HISTORY — DX: Diverticulosis of intestine, part unspecified, without perforation or abscess without bleeding: K57.90

## 2019-12-11 HISTORY — DX: Constipation, unspecified: K59.00

## 2019-12-11 NOTE — Progress Notes (Addendum)
Completed COVID 19 vaccine series on 12/11/19  PCP - Dr. Wilhemena Durie Cardiologist - Dr. Beckie Salts, clearance by Teodoro Kil telephone encounter in [place] 11/20/19  Loop recorder check 11/28/19 in epic Chest x-ray - 02/23/19 in epic EKG - 02/26/2019 in epic Stress Test -  Greater than 2 years ECHO -  Greater than 2 years Cardiac Cath - N/A  Sleep Study - 01/31/18 in epic CPAP -  Does not wear CPAP  Fasting Blood Sugar - N/A Checks Blood Sugar __N/A___ times a day  Blood Thinner Instructions: Xarelto last dose 12/13/19 Aspirin Instructions: N/A Last Dose: N/A  Anesthesia review: PAF, Ischemic cardiomyopathy, Stroke, Loop recorder, HTN, OSA  Patient denies shortness of breath, fever, cough and chest pain at PAT appointment   Patient verbalized understanding of instructions that were given to them at the PAT appointment. Patient was also instructed that they will need to review over the PAT instructions again at home before surgery.

## 2019-12-11 NOTE — Progress Notes (Signed)
   Covid-19 Vaccination Clinic  Name:  Jared [name]    MRN: [id number] DOB: Feb 03, 1941  12/11/2019  [name] was observed post Covid-19 immunization for 15 minutes without incident. He was provided with Vaccine Information Sheet and instruction to access the V-Safe system.   [name] was instructed to call 911 with any severe reactions post vaccine: Marland Kitchen Difficulty breathing  . Swelling of face and throat  . A fast heartbeat  . A bad rash all over body  . Dizziness and weakness   Immunizations Administered    Name Date Dose VIS Date Route   Pfizer COVID-19 Vaccine 12/11/2019 10:17 AM 0.3 mL 08/30/2019 Intramuscular   Manufacturer: [place]   Lot: [id number]   [place]: [id number]

## 2019-12-13 ENCOUNTER — Other Ambulatory Visit: Payer: Self-pay

## 2019-12-13 ENCOUNTER — Encounter (HOSPITAL_COMMUNITY)
Admission: RE | Admit: 2019-12-13 | Discharge: 2019-12-13 | Disposition: A | Discharge status: Home / Self Care | Payer: Medicare HMO | Source: Ambulatory Visit | Attending: Urology | Admitting: Urology

## 2019-12-13 ENCOUNTER — Other Ambulatory Visit (HOSPITAL_COMMUNITY)
Admission: RE | Admit: 2019-12-13 | Discharge: 2019-12-13 | Disposition: A | Discharge status: Home / Self Care | Payer: Medicare HMO | Source: Ambulatory Visit | Attending: Urology | Admitting: Urology

## 2019-12-13 DIAGNOSIS — Z01812 Encounter for preprocedural laboratory examination: Secondary | ICD-10-CM | POA: Diagnosis not present

## 2019-12-13 DIAGNOSIS — F329 Major depressive disorder, single episode, unspecified: Secondary | ICD-10-CM | POA: Insufficient documentation

## 2019-12-13 DIAGNOSIS — M170 Bilateral primary osteoarthritis of knee: Secondary | ICD-10-CM | POA: Diagnosis not present

## 2019-12-13 DIAGNOSIS — N200 Calculus of kidney: Secondary | ICD-10-CM | POA: Diagnosis not present

## 2019-12-13 DIAGNOSIS — Z79899 Other long term (current) drug therapy: Secondary | ICD-10-CM | POA: Insufficient documentation

## 2019-12-13 DIAGNOSIS — Z8673 Personal history of transient ischemic attack (TIA), and cerebral infarction without residual deficits: Secondary | ICD-10-CM | POA: Insufficient documentation

## 2019-12-13 DIAGNOSIS — I480 Paroxysmal atrial fibrillation: Secondary | ICD-10-CM | POA: Diagnosis not present

## 2019-12-13 DIAGNOSIS — I10 Essential (primary) hypertension: Secondary | ICD-10-CM | POA: Diagnosis not present

## 2019-12-13 DIAGNOSIS — R011 Cardiac murmur, unspecified: Secondary | ICD-10-CM | POA: Diagnosis not present

## 2019-12-13 DIAGNOSIS — F419 Anxiety disorder, unspecified: Secondary | ICD-10-CM | POA: Diagnosis not present

## 2019-12-13 DIAGNOSIS — Z20822 Contact with and (suspected) exposure to covid-19: Secondary | ICD-10-CM | POA: Insufficient documentation

## 2019-12-13 DIAGNOSIS — Z7901 Long term (current) use of anticoagulants: Secondary | ICD-10-CM | POA: Insufficient documentation

## 2019-12-13 LAB — COMPREHENSIVE METABOLIC PANEL
ALT: 30 U/L (ref 0–44)
AST: 25 U/L (ref 15–41)
Albumin: 3.9 g/dL (ref 3.5–5.0)
Alkaline Phosphatase: 99 U/L (ref 38–126)
Anion gap: 8 (ref 5–15)
BUN: 19 mg/dL (ref 8–23)
CO2: 27 mmol/L (ref 22–32)
Calcium: 9.6 mg/dL (ref 8.9–10.3)
Chloride: 109 mmol/L (ref 98–111)
Creatinine, Ser: 1.09 mg/dL (ref 0.61–1.24)
GFR calc Af Amer: >60 mL/min (ref >60)
GFR calc non Af Amer: >60 mL/min (ref >60)
Glucose, Bld: 118 mg/dL — ABNORMAL HIGH (ref 70–99)
Potassium: 4.7 mmol/L (ref 3.5–5.1)
Sodium: 144 mmol/L (ref 135–145)
Total Bilirubin: 0.6 mg/dL (ref 0.3–1.2)
Total Protein: 7.5 g/dL (ref 6.5–8.1)

## 2019-12-13 LAB — CBC
HCT: 41.5 % (ref 39.0–52.0)
Hemoglobin: 13.8 g/dL (ref 13.0–17.0)
MCH: 30.7 pg (ref 26.0–34.0)
MCHC: 33.3 g/dL (ref 30.0–36.0)
MCV: 92.2 fL (ref 80.0–100.0)
Platelets: 175 10*3/uL (ref 150–400)
RBC: 4.5 MIL/uL (ref 4.22–5.81)
RDW: 12.5 % (ref 11.5–15.5)
WBC: 5.8 10*3/uL (ref 4.0–10.5)
nRBC: 0 % (ref 0.0–0.2)

## 2019-12-13 LAB — SARS CORONAVIRUS 2 (TAT 6-24 HRS): SARS Coronavirus 2: NEGATIVE

## 2019-12-15 ENCOUNTER — Other Ambulatory Visit: Payer: Self-pay | Admitting: Radiology

## 2019-12-15 NOTE — H&P (Signed)
HPI: Jared [name] is a 79 year-old male with a left renal calculus.  He first noticed the symptoms 10/23/2018. He did see the blood in his urine. He has not seen blood clots.   He does have a burning sensation when he urinates. He is not currently having trouble urinating.   He has had kidney stones. He is having pain. He has not recently had unwanted weight loss.   His last U/S or CT Scan was 02/06/2019. This condition would be considered of mild to moderate severity with no modifying factors or associated signs or symptoms other than as noted above.   11/15/18: The patient reported on 10/28/18 that he had experienced a single episode of hematuria on finasteride and Xarelto. This had occurred approximately 5 days prior to his appointment and had not recurred. He has a history of calculus disease but had no pain to suggest a passage of a recent stone and no change in his voiding pattern which consisted of urinary urgency that had been present for several years. His urinalysis at that time had 7-10 RBC/HPF with no sign of infection.  He denies dysuria or a history of urinary tract infections. He said as far as his stones go he has had 3 one which has required a right ureterolithotomy many years ago and then to others which he passed spontaneously. He has never smoked nor worked around Scientist, physiological. He does have some voiding symptoms consisting of some urgency, frequency and urge incontinence with nocturia 1-2 times.   02/14/19: He returns today having undergone upper tract evaluation with a CT scan. He reports that he has not seen any further hematuria and remains on Xarelto. No new voiding symptoms are noted but he continues to have some frequency and urgency and occasional episodes of urge incontinence that we discussed further in it seems that this may be in part related to his caffeine intake.   10/30/19: On 10/28/19 the patient reported experiencing rust colored urine and experiencing left  lower quadrant soreness. A urinalysis at that time was 3+ positive for blood. A KUB was therefore obtained which revealed a calcification in the area right renal pelvis as well as a 5 mm stone in the periphery of the left kidney. When compared to a lumbar spine series done in 8/20 the calcification on the right-hand side that was felt to be in the renal pelvis is unchanged in size and position. The left renal stone also is unchanged. When I saw him last in 5/20 he had a 9 mm left ureteral stone and my recommendation for follow-up of this was return in 2 weeks which the patient failed to do.  He reports to me today that he has never been a smoker. He is not having any flank pain but is having pain in his left hip. He also reports that he never saw the stone that was in his left ureter pass.   11/11/19: Jared [name] has the above PMHX. He presents today with constant left sided flank pain that is intermittently worse at times, associated with nausea, gross hematuria and pain into the left groin. Pain began last night. He denies dysuria, frequency, leakage and fevers. He denies passage of stone fragments. He does have a kidney stone history. He did take acetaminophen this AM for pain. He was last seen in our office on 10/30/19 for hematuria and a BMP was ordered as well as a scheduled CT w/wo contrast. He is currently on xarelto.  ALLERGIES: No Known Allergies    MEDICATIONS: Avodart 0.5 mg capsule  Omeprazole  Tamsulosin Hcl 0.4 mg capsule 1 capsule PO Q PM  Coreg 6.25 mg tablet  Diclofenac Sodium 3 % gel  Diovan 80 mg tablet  Multivitamins  Norvasc 10 mg tablet  Remeron  Restasis  Systane  Tylenol 325 mg capsule  Wellbutrin Sr  Xanax  Xarelto 20 mg tablet     GU PSH: Locm 300-399Mg /Ml Iodine,1Ml - 11/11/2019, 02/06/2019     NON-GU PSH: None   GU PMH: Flank Pain - 11/11/2019 Gross hematuria - 11/11/2019, He has experienced gross hematuria with no change in the stones within his kidneys. We  therefore discussed imaging of his kidneys with a CT scan and then completion of his hematuria workup with cystoscopy. , - 10/30/2019 (Stable), His hematuria is clearly secondary to a stone in his left ureter with no abnormality noted cystoscopically today., - 02/14/2019, We discussed the need to evaluate him for the source of his gross hematuria with renal function studies, upper tract evaluation with a CT scan and lower tract evaluation with cystoscopy., - 11/15/2018 Renal calculus, Right, The right renal calculi identified on his CT scan are nonobstructing. - 02/14/2019 Renal cyst, Bilateral, His CT scan also revealed bilateral simple renal cysts. There were more on the right-hand side - 02/14/2019 Ureteral calculus, Left, After discussing the options for management of his left ureteral stone he indicated that he has passed stones previously and would like to try to pass this stone although I told him I thought the probability was low he still wanted to give it a try especially since he has not been having any pain. I therefore will place him on an alpha-blocker and have him return in 2 weeks for a KUB. - 02/14/2019 BPH w/LUTS, He does have BPH with some irritative rather than obstructive voiding symptoms - 11/15/2018 Urinary Urgency, He did describe urgency, frequency and some urge incontinence and therefore if no irritative focus is found within the bladder I would consider trial of Myrbetriq. - 11/15/2018    NON-GU PMH: Pseudocyst of pancreas, Discussed the finding of pancreatic cysts noted in the need for possible follow-up. He has a history of having had a very severe episode of pancreatitis in the past. I told him I would supply Dr. Damita [name] with a copy of the CT scan report. - 02/14/2019 Anxiety Depression GERD Hypercholesterolemia Hypertension Sleep Apnea    FAMILY HISTORY: 1 Daughter - Runs in Family 1 son - Runs in Family Hypertension - Father   SOCIAL HISTORY: Marital Status:  Divorced Preferred Language: English; Ethnicity: Not Hispanic Or Latino; Race: White Current Smoking Status: Patient has never smoked.   Tobacco Use Assessment Completed: Used Tobacco in last 30 days? Has never drank.  Does not drink caffeine.    REVIEW OF SYSTEMS:    GU Review Male:   Patient denies frequent urination, hard to postpone urination, burning/ pain with urination, get up at night to urinate, leakage of urine, stream starts and stops, trouble starting your stream, have to strain to urinate , erection problems, and penile pain.  Gastrointestinal (Upper):   Patient denies nausea, vomiting, and indigestion/ heartburn.  Gastrointestinal (Lower):   Patient denies diarrhea and constipation.  Constitutional:   Patient denies fever, night sweats, weight loss, and fatigue.  Skin:   Patient denies skin rash/ lesion and itching.  Eyes:   Patient denies blurred vision and double vision.  Ears/ Nose/ Throat:   Patient denies sore  throat and sinus problems.  Hematologic/Lymphatic:   Patient denies swollen glands and easy bruising.  Cardiovascular:   Patient denies leg swelling and chest pains.  Respiratory:   Patient denies cough and shortness of breath.  Endocrine:   Patient denies excessive thirst.  Musculoskeletal:   Patient denies back pain and joint pain.  Neurological:   Patient denies headaches and dizziness.  Psychologic:   Patient denies depression and anxiety.   VITAL SIGNS:    Weight 197 lb / 89.36 kg  Height 70 in / 177.8 cm  BP 166/76 mmHg  Pulse 44 /min  Temperature 97.3 F / 36.2 C  BMI 28.3 kg/m   GU PHYSICAL EXAMINATION:    Anus and Perineum: No hemorrhoids. No anal stenosis. No rectal fissure, no anal fissure. No edema, no dimple, no perineal tenderness, no anal tenderness.  Scrotum: No lesions. No edema. No cysts. No warts.  Epididymides: Right: no spermatocele, no masses, no cysts, no tenderness, no induration, no enlargement. Left: no spermatocele, no masses, no  cysts, no tenderness, no induration, no enlargement.  Testes: No tenderness, no swelling, no enlargement left testes. No tenderness, no swelling, no enlargement right testes. Normal location left testes. Normal location right testes. No mass, no cyst, no varicocele, no hydrocele left testes. No mass, no cyst, no varicocele, no hydrocele right testes.  Urethral Meatus: Normal size. No lesion, no wart, no discharge, no polyp. Normal location.  Penis: Penis uncircumcised. No foreskin warts, no cracks. No dorsal peyronie's plaques, no left corporal peyronie's plaques, no right corporal peyronie's plaques, no scarring, no shaft warts. No balanitis, no meatal stenosis.   Prostate: Prostate 2 1/2+ size. Left lobe normal consistency, right lobe normal consistency. Symmetrical lobes. No prostate nodule. Left lobe no tenderness, right lobe no tenderness.   Seminal Vesicles: Nonpalpable.  Sphincter Tone: Normal sphincter. No rectal tenderness. No rectal mass.    MULTI-SYSTEM PHYSICAL EXAMINATION:    Constitutional: Well-nourished. No physical deformities. Normally developed. Good grooming.  Neck: Neck symmetrical, not swollen. Normal tracheal position.  Respiratory: No labored breathing, no use of accessory muscles.   Cardiovascular: Normal temperature, normal extremity pulses, no swelling, no varicosities.  Lymphatic: No enlargement of neck, axillae, groin.  Skin: No paleness, no jaundice, no cyanosis. No lesion, no ulcer, no rash.  Neurologic / Psychiatric: Oriented to time, oriented to place, oriented to person. No depression, no anxiety, no agitation.  Gastrointestinal: No mass, no tenderness, no rigidity, non obese abdomen.  Eyes: Normal conjunctivae. Normal eyelids.  Ears, Nose, Mouth, and Throat: Left ear no scars, no lesions, no masses. Right ear no scars, no lesions, no masses. Nose no scars, no lesions, no masses. Normal hearing. Normal lips.  Musculoskeletal: Normal gait and station of head and  neck.    PAST DATA REVIEWED:  Source Of History:  Patient  Lab Test Review:   BUN/Creatinine  Records Review:   Previous Patient Records  X-Ray Review: C.T. Hematuria: Reviewed Films. Reviewed Report. Discussed With Patient. EXAM: CT ABDOMEN AND PELVIS WITHOUT AND WITH CONTRAST TECHNIQUE: Multidetector CT imaging of the abdomen and pelvis was performed following the standard protocol before and following the bolus administration of intravenous contrast. CONTRAST: 125 mL Omnipaque 300 iodinated contrast IV COMPARISON: 02/06/2019 FINDINGS: Lower chest: No acute abnormality. Coronary artery calcifications. Hepatobiliary: No focal liver abnormality is seen. Status post cholecystectomy. Postoperative biliary dilatation. Pancreas: Unremarkable. No pancreatic ductal dilatation or surrounding inflammatory changes. Spleen: Normal in size without significant abnormality. Adrenals/Urinary Tract: Adrenal glands are unremarkable.  There is a 1.5 cm calculus in the left renal pelvis with extensive thickening of the renal pelvis and adjacent fat stranding. This may reflect a previously seen calculus present in the proximal left ureter, although is significantly increased in size and in a more proximal location when compared to prior. There are several additional very tiny bilateral punctuate renal calculi or medullary calcifications. Multiple bilateral, mostly exophytic renal cysts without suspicious contrast enhancement. Bladder is unremarkable. Stomach/Bowel: Stomach is within normal limits. Appendix appears normal. No evidence of bowel wall thickening, distention, or inflammatory changes. Large burden of stool and stool balls throughout the colon. Vascular/Lymphatic: Aortic atherosclerosis. 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 No enlarged abdominal or pelvic lymph nodes. Reproductive: Prostatomegaly. Other: No abdominal wall hernia or abnormality. No  abdominopelvic ascites. Musculoskeletal: No acute or significant osseous findings. IMPRESSION: 1. There is a 1.5 cm calculus in the left renal pelvis with extensive thickening of the renal pelvis and adjacent fat stranding. This may reflect a previously seen calculus present in the proximal left ureter, although is significantly increased in size and in a more proximal location when compared to prior. 2. There are several additional very tiny bilateral punctuate renal calculi or medullary calcifications. 3. No suspicious renal lesions. No urinary tract filling defect on delayed phase imaging. 4. Prostatomegaly. 5. Coronary artery disease. Aortic Atherosclerosis (ICD10-I70.0). 6. 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     11/15/18 09/30/16  PSA  Total PSA 0.54 ng/mL 0.99 ng/dl    10/30/19 02/06/19 11/15/18  General Chemistry  Creatinine 0.7 mg/dL 1.0 mg/dL 0.7 mg/dL   PROCEDURES:         Flexible Cystoscopy - 11/20/19  Risks, benefits, and some of the potential complications were discussed. Sterile technique and 2% Lidocaine intraurethral analgesia were used.  Meatus:  Normal size. Normal location. Normal condition.  Urethra:  No strictures.  External Sphincter:  Normal.  Verumontanum:  Normal.  Prostate:  Obstructing. Moderate hyperplasia.  Bladder Neck:  Non-obstructing.  Ureteral Orifices:  Normal location. Normal size. Normal shape. Effluxed clear urine.  Bladder:  Moderate trabeculation. No tumors. Normal mucosa. No stones.      The lower urinary tract was carefully examined. The procedure was well-tolerated and without complications. Instructions were given to call the office immediately for bloody urine, difficulty urinating, urinary retention, painful or frequent urination, fever or other illness. The patient stated that he understood these instructions and would comply with them.   ASSESSMENT/PLAN:      ICD-10 Details  1 GU:   Gross  hematuria - R31.0 Chronic, Stable - There was no worrisome findings noted cystoscopically. It appears his gross hematuria is secondary to Xarelto and his left renal pelvic stone.  2   Renal calculus - N20.0 Left, Chronic, Exacerbation - Has bilateral renal calculi that are punctate and peripherally located but also a 1.5 cm stone in the renal pelvis of the left kidney with Hounsfield units of approximately 1000.   3   Renal cyst - N28.1 Bilateral, Chronic, Stable - Bilateral renal cysts were identified on his CT scan. They appear simple and are no clinical significance.              Notes:   We discussed the management of urinary stones. These options include observation, ureteroscopy, shockwave lithotripsy, and PCNL. We discussed which options are relevant to these particular stones. We discussed the natural history of stones as  well as the complications of untreated stones and the impact on quality of life without treatment as well as with each of the above listed treatments. We also discussed the efficacy of each treatment in its ability to clear the stone burden. With any of these management options I discussed the signs and symptoms of infection and the need for emergent treatment should these be experienced. For each option we discussed the ability of each procedure to clear the patient of their stone burden.   For observation I described the risks which include but are not limited to silent renal damage, life-threatening infection, need for emergent surgery, failure to pass stone, and pain.   For ureteroscopy I described the risks which include heart attack, stroke, pulmonary embolus, death, bleeding, infection, damage to contiguous structures, positioning injury, ureteral stricture, ureteral avulsion, ureteral injury, need for ureteral stent, inability to perform ureteroscopy, need for an interval procedure, inability to clear stone burden, stent discomfort and pain.   For shockwave lithotripsy I  described the risks which include arrhythmia, kidney contusion, kidney hemorrhage, need for transfusion, long-term risk of diabetes or hypertension, back discomfort, flank ecchymosis, flank abrasion, inability to break up stone, inability to pass stone fragments, Steinstrasse, infection associated with obstructing stones, need for different surgical procedure, need for repeat shockwave lithotripsy, and death.   For PCNL I described the risks including heart attack, sure, pulmonary embolus, death, positioning injury, pneumothorax, hydrothorax, need for chest tube, inability to clear stone burden, renal laceration, arterial venous fistula or malformation, need for embolization of kidney, loss of kidney or renal function, need for repeat procedure, need for prolonged nephrostomy tube, ureteral avulsion, fistula.   After discussing all of the options for treatment of his left renal calculus he has elected proceed with PCNL. He has had a previous open nephrolithotomy in the 1960s. He is on Xarelto and will need to come off of that so I requested and received clearance from his cardiologist.

## 2019-12-15 NOTE — Discharge Instructions (Signed)

## 2019-12-16 NOTE — Progress Notes (Signed)
Anesthesia Chart Review   Case: [id number] Date/Time: 12/17/19 1045   Procedure: NEPHROLITHOTOMY PERCUTANEOUS (Left )   Anesthesia type: General   Pre-op diagnosis: LEFT RENAL CALCULI   Location: [place] [facility]   Surgeons: Kathie Rhodes, MD      DISCUSSION:79 y.o. never smoker with h/o anxiety, depression, GERD, Afib (on Xarelto), BPH, HTN, CVA 2018, OSA w/o device use, right knee OA scheduled for above procedure 02/22/2019 with Dr. [name].   Pt cleared by cardiology.  Per Cherlynn Polo, PA-C, "Chart reviewed as part of pre-operative protocol coverage. Patient was contacted 11/22/2019 in reference to pre-operative risk assessment for pending surgery as outlined below.  Jared Jared was last seen on 08/12/2019 by Oda Kilts, PA-C.  Since that day, Jared Jared has done well from a cardiac standpoint. He has been limited in mobility after sustaining a hip fracture last year. That being said, he can still complete 4 METs without anginal complaints. He denies recent palpitations.  Therefore, based on ACC/AHA guidelines, the patient would be at acceptable risk for the planned procedure without further cardiovascular testing.   Per Dr. Lovena Le, patient can hold xarelto 2-3 days prior to his upcoming procedure and should restart as soon as he is cleared to do so by Dr. Karsten Ro."  Anticipate pt can proceed with planned procedure barring acute status change.   VS: BP (!) 169/73   Pulse 61   Temp 36.8 C (Oral)   Resp 16   Ht 5\' 10"  (1.778 m)   Wt 91.7 kg   SpO2 96%   BMI 29.01 kg/m   PROVIDERS: [name], MD  [name], MD is Cardiologist  LABS: Labs reviewed: Acceptable for surgery. (all labs ordered are listed, but only abnormal results are displayed)  Labs Reviewed  COMPREHENSIVE METABOLIC PANEL - Abnormal; Notable for the following components:      Result Value   Glucose, Bld 118 (*)    All other components within normal limits  CBC      IMAGES:   EKG: 02/26/2019 Rate 89 bpm Sinus rhythm Atrial premature complexes Left anterior fascicular block Abnormal R-wave progression, late transition Left ventricular hypertrophy No significant change was found  CV: Echo 07/31/2017 Study Conclusions   - Left ventricle: Systolic function was normal. The estimated  ejection fraction was in the range of 60% to 65%. Wall motion was  normal; there were no regional wall motion abnormalities.  - Aorta: There was severe non-mobile atheroma.  - Ascending aorta: The ascending aorta was normal in size.  - Descending aorta: The descending aorta was normal in size.  - Mitral valve: There was mild regurgitation.  - Left atrium: No evidence of thrombus in the atrial cavity or  appendage. No evidence of thrombus in the atrial cavity or  appendage.  - Right ventricle: Systolic function was normal.  - Right atrium: The atrium was dilated. No evidence of thrombus in  the atrial cavity or appendage.  - Atrial septum: No defect or patent foramen ovale was identified.  - Pericardium, extracardiac: There was no pericardial effusion.   Impressions:   - No cardiac source of emboli was indentified.  Myocardial Perfusion 07/29/2017  There was no ST segment deviation noted during stress.  Findings consistent with prior moderate to large mid to distal anterior and apical myocardial infarction with moderate peri-infarct ischemia. Small basal septal infarct.  The left ventricular ejection fraction is moderately decreased (30-44%).  This is a high risk  study. High risk based on decreased LVEF and moderate ischemia.  Nuclear stress EF: 32%. Past Medical History:  Diagnosis Date  . Allergic rhinitis   . Anticoagulant long-term use    xarelto  . Anxiety   . Benign localized prostatic hyperplasia with lower urinary tract symptoms (LUTS)   . Benzodiazepine dependence ([place])   . Constipation   . Diverticulosis   . Edema of  right lower extremity   . GERD (gastroesophageal reflux disease)   . Heart murmur   . Hematuria   . Hepatitis    History of  . History of colonic polyps   . History of CVA (cerebrovascular accident) without residual deficits 07/26/2017   crytogenic stroke -- acute left caudate body infarct   . History of kidney stones   . Hypertension   . Ischemic cardiomyopathy 07/26/2017   ef 35-40%;    TEE 07-31-2017 ef 60-65%  . Left anterior fascicular block 02/26/2019   Noted on EKG  . LVH (left ventricular hypertrophy) 02/26/2019   Noted on EKG   . MDD (major depressive disorder), recurrent, in partial remission ([place])   . OA (osteoarthritis)    knees,   . OSA (obstructive sleep apnea)    intolerant cpap  . PAF (paroxysmal atrial fibrillation) [facility])    cardiologist-  dr g. taylor  . Skin cancer    Childhood, right foot on toe  . Status post placement of implantable loop recorder 07/31/2017  . Stroke ([place])   . Swelling of right foot 12/2018  . Throat mass   . Wears glasses     Past Surgical History:  Procedure Laterality Date  . APPENDECTOMY  1970s  . CATARACT EXTRACTION W/ INTRAOCULAR LENS  IMPLANT, BILATERAL Bilateral 2014  . CHOLECYSTECTOMY  1970s  . COLONOSCOPY  2014  . CYSTOSCOPY    . FEMUR IM NAIL Right 02/27/2019   Procedure: INTRAMEDULLARY (IM) NAIL FEMORAL;  Surgeon: Rod Can, MD;  Location: [facility];  Service: Orthopedics;  Laterality: Right;  . FOOT SURGERY Left yrs ago  . HERNIA REPAIR  2014  . LOOP RECORDER INSERTION N/A 07/31/2017   Procedure: LOOP RECORDER INSERTION;  Surgeon: Evans Lance, MD;  Location: [facility];  Service: Cardiovascular;  Laterality: N/A;  . TEE WITHOUT CARDIOVERSION N/A 07/31/2017   Procedure: TRANSESOPHAGEAL ECHOCARDIOGRAM (TEE) WITH LOOP;  Surgeon: Dorothy Spark, MD;  Location: [facility];  Service: Cardiovascular;  Laterality: N/A;  . TOTAL KNEE ARTHROPLASTY Left 02/22/2019   Procedure: TOTAL KNEE ARTHROPLASTY;   Surgeon: [name], MD;  Location: [facility];  Service: Orthopedics;  Laterality: Left;  . WRIST FRACTURE SURGERY  1970s   right x2  unsure if any hardware remain   ;    left x1 with pinning then removed    MEDICATIONS: . ALPRAZolam (XANAX) 1 MG tablet  . amLODipine (NORVASC) 10 MG tablet  . bisacodyl (DULCOLAX) 10 MG suppository  . buPROPion (WELLBUTRIN SR) 150 MG 12 hr tablet  . carvedilol (COREG) 6.25 MG tablet  . dutasteride (AVODART) 0.5 MG capsule  . mirtazapine (REMERON) 15 MG tablet  . Propylene Glycol (SYSTANE BALANCE) 0.6 % SOLN  . RESTASIS 0.05 % ophthalmic emulsion  . timolol (TIMOPTIC) 0.5 % ophthalmic solution  . valsartan (DIOVAN) 80 MG tablet  . XARELTO 20 MG TABS tablet   No current facility-administered medications for this encounter.

## 2019-12-17 ENCOUNTER — Ambulatory Visit (HOSPITAL_COMMUNITY)
Admission: RE | Admit: 2019-12-17 | Discharge: 2019-12-17 | Disposition: A | Discharge status: Home / Self Care | Payer: Medicare HMO | Source: Ambulatory Visit | Attending: Urology | Admitting: Urology

## 2019-12-17 ENCOUNTER — Other Ambulatory Visit: Payer: Self-pay

## 2019-12-17 ENCOUNTER — Ambulatory Visit (HOSPITAL_COMMUNITY): Payer: Medicare HMO

## 2019-12-17 ENCOUNTER — Ambulatory Visit (HOSPITAL_COMMUNITY): Payer: Medicare HMO | Admitting: Physician Assistant

## 2019-12-17 ENCOUNTER — Ambulatory Visit (HOSPITAL_COMMUNITY)
Admission: RE | Admit: 2019-12-17 | Discharge: 2019-12-18 | Disposition: A | Discharge status: Home / Self Care | Payer: Medicare HMO | Attending: Urology | Admitting: Urology

## 2019-12-17 ENCOUNTER — Encounter (HOSPITAL_COMMUNITY)
Admission: RE | Disposition: A | Discharge status: Home / Self Care | Payer: Self-pay | Source: Home / Self Care | Attending: Urology

## 2019-12-17 ENCOUNTER — Encounter (HOSPITAL_COMMUNITY): Payer: Self-pay | Admitting: Urology

## 2019-12-17 DIAGNOSIS — N200 Calculus of kidney: Secondary | ICD-10-CM

## 2019-12-17 DIAGNOSIS — Z87442 Personal history of urinary calculi: Secondary | ICD-10-CM | POA: Diagnosis not present

## 2019-12-17 DIAGNOSIS — I10 Essential (primary) hypertension: Secondary | ICD-10-CM | POA: Diagnosis not present

## 2019-12-17 DIAGNOSIS — N3941 Urge incontinence: Secondary | ICD-10-CM | POA: Insufficient documentation

## 2019-12-17 DIAGNOSIS — Z7901 Long term (current) use of anticoagulants: Secondary | ICD-10-CM | POA: Diagnosis not present

## 2019-12-17 DIAGNOSIS — R310 Gross hematuria: Secondary | ICD-10-CM | POA: Diagnosis not present

## 2019-12-17 DIAGNOSIS — K219 Gastro-esophageal reflux disease without esophagitis: Secondary | ICD-10-CM | POA: Insufficient documentation

## 2019-12-17 DIAGNOSIS — F419 Anxiety disorder, unspecified: Secondary | ICD-10-CM | POA: Diagnosis not present

## 2019-12-17 DIAGNOSIS — F329 Major depressive disorder, single episode, unspecified: Secondary | ICD-10-CM | POA: Insufficient documentation

## 2019-12-17 DIAGNOSIS — Z79899 Other long term (current) drug therapy: Secondary | ICD-10-CM | POA: Insufficient documentation

## 2019-12-17 DIAGNOSIS — N281 Cyst of kidney, acquired: Secondary | ICD-10-CM | POA: Diagnosis not present

## 2019-12-17 DIAGNOSIS — Z8249 Family history of ischemic heart disease and other diseases of the circulatory system: Secondary | ICD-10-CM | POA: Insufficient documentation

## 2019-12-17 DIAGNOSIS — G4730 Sleep apnea, unspecified: Secondary | ICD-10-CM | POA: Diagnosis not present

## 2019-12-17 DIAGNOSIS — G4733 Obstructive sleep apnea (adult) (pediatric): Secondary | ICD-10-CM | POA: Diagnosis not present

## 2019-12-17 DIAGNOSIS — E7800 Pure hypercholesterolemia, unspecified: Secondary | ICD-10-CM | POA: Insufficient documentation

## 2019-12-17 DIAGNOSIS — N132 Hydronephrosis with renal and ureteral calculous obstruction: Secondary | ICD-10-CM | POA: Diagnosis not present

## 2019-12-17 DIAGNOSIS — R351 Nocturia: Secondary | ICD-10-CM | POA: Diagnosis not present

## 2019-12-17 HISTORY — PX: NEPHROLITHOTOMY: SHX5134

## 2019-12-17 HISTORY — PX: IR URETERAL STENT LEFT NEW ACCESS W/O SEP NEPHROSTOMY CATH: IMG6075

## 2019-12-17 LAB — CBC WITH DIFFERENTIAL/PLATELET
Abs Immature Granulocytes: 0.02 10*3/uL (ref 0.00–0.07)
Basophils Absolute: 0.1 10*3/uL (ref 0.0–0.1)
Basophils Relative: 1 %
Eosinophils Absolute: 0.4 10*3/uL (ref 0.0–0.5)
Eosinophils Relative: 6 %
HCT: 40.7 % (ref 39.0–52.0)
Hemoglobin: 13.6 g/dL (ref 13.0–17.0)
Immature Granulocytes: 0 %
Lymphocytes Relative: 34 %
Lymphs Abs: 2.1 10*3/uL (ref 0.7–4.0)
MCH: 30.5 pg (ref 26.0–34.0)
MCHC: 33.4 g/dL (ref 30.0–36.0)
MCV: 91.3 fL (ref 80.0–100.0)
Monocytes Absolute: 0.8 10*3/uL (ref 0.1–1.0)
Monocytes Relative: 13 %
Neutro Abs: 2.9 10*3/uL (ref 1.7–7.7)
Neutrophils Relative %: 46 %
Platelets: 170 10*3/uL (ref 150–400)
RBC: 4.46 MIL/uL (ref 4.22–5.81)
RDW: 12.6 % (ref 11.5–15.5)
WBC: 6.2 10*3/uL (ref 4.0–10.5)
nRBC: 0 % (ref 0.0–0.2)

## 2019-12-17 LAB — PROTIME-INR
INR: 1.1 (ref 0.8–1.2)
Prothrombin Time: 13.8 seconds (ref 11.4–15.2)

## 2019-12-17 SURGERY — NEPHROLITHOTOMY PERCUTANEOUS
Anesthesia: General | Laterality: Left

## 2019-12-17 MED ORDER — ACETAMINOPHEN 10 MG/ML IV SOLN: 1000.0000 mg | Freq: Once | INTRAVENOUS | Status: DC | PRN

## 2019-12-17 MED ORDER — HYDRALAZINE HCL 20 MG/ML IJ SOLN: 5.0000 mg | Freq: Once | INTRAMUSCULAR | Status: DC

## 2019-12-17 MED ORDER — CEFAZOLIN SODIUM-DEXTROSE 2-4 GM/100ML-% IV SOLN
2.0000 g | Freq: Once | INTRAVENOUS | Status: AC
  Administered 2019-12-17: 2 g via INTRAVENOUS

## 2019-12-17 MED ORDER — IODIXANOL 320 MG/ML IV SOLN
50.0000 mL | Freq: Once | INTRAVENOUS | Status: AC | PRN
  Administered 2019-12-17: 50 mL via INTRAVENOUS

## 2019-12-17 MED ORDER — MIDAZOLAM HCL 2 MG/2ML IJ SOLN
INTRAMUSCULAR | Status: AC | PRN
  Administered 2019-12-17: 1 mg via INTRAVENOUS
  Administered 2019-12-17: 0.5 mg via INTRAVENOUS
  Administered 2019-12-17: 1 mg via INTRAVENOUS

## 2019-12-17 MED ORDER — CEFAZOLIN SODIUM-DEXTROSE 2-4 GM/100ML-% IV SOLN: 2.0000 g | INTRAVENOUS | Status: DC

## 2019-12-17 MED ORDER — OXYCODONE-ACETAMINOPHEN 5-325 MG PO TABS
1.0000 | ORAL_TABLET | ORAL | Status: DC | PRN
  Administered 2019-12-18: 2 via ORAL
  Filled 2019-12-17: qty 2

## 2019-12-17 MED ORDER — PROPOFOL 10 MG/ML IV BOLUS
INTRAVENOUS | Status: DC | PRN
  Administered 2019-12-17: 130 mg via INTRAVENOUS

## 2019-12-17 MED ORDER — ALPRAZOLAM 0.5 MG PO TABS: 0.5000 mg | ORAL_TABLET | Freq: Three times a day (TID) | ORAL | Status: DC | PRN

## 2019-12-17 MED ORDER — SODIUM CHLORIDE 0.9 % IV SOLN: INTRAVENOUS | Status: DC

## 2019-12-17 MED ORDER — BUPIVACAINE HCL 0.5 % IJ SOLN
INTRAMUSCULAR | Status: DC | PRN
  Administered 2019-12-17: 10 mL

## 2019-12-17 MED ORDER — LACTATED RINGERS IV SOLN: INTRAVENOUS | Status: DC

## 2019-12-17 MED ORDER — OXYBUTYNIN CHLORIDE 5 MG PO TABS: 5.0000 mg | ORAL_TABLET | Freq: Three times a day (TID) | ORAL | Status: DC | PRN

## 2019-12-17 MED ORDER — TIMOLOL MALEATE 0.5 % OP SOLN
1.0000 [drp] | Freq: Two times a day (BID) | OPHTHALMIC | Status: DC
  Administered 2019-12-17 – 2019-12-18 (×2): 1 [drp] via OPHTHALMIC
  Filled 2019-12-17: qty 5

## 2019-12-17 MED ORDER — ONDANSETRON HCL 4 MG/2ML IJ SOLN
INTRAMUSCULAR | Status: AC
  Filled 2019-12-17: qty 2

## 2019-12-17 MED ORDER — CEFAZOLIN SODIUM-DEXTROSE 1-4 GM/50ML-% IV SOLN
1.0000 g | Freq: Three times a day (TID) | INTRAVENOUS | Status: AC
  Administered 2019-12-17 – 2019-12-18 (×2): 1 g via INTRAVENOUS
  Filled 2019-12-17 (×2): qty 50

## 2019-12-17 MED ORDER — BUPROPION HCL ER (SR) 150 MG PO TB12
150.0000 mg | ORAL_TABLET | Freq: Two times a day (BID) | ORAL | Status: DC
  Administered 2019-12-17 – 2019-12-18 (×2): 150 mg via ORAL
  Filled 2019-12-17 (×2): qty 1

## 2019-12-17 MED ORDER — FENTANYL CITRATE (PF) 100 MCG/2ML IJ SOLN
INTRAMUSCULAR | Status: DC | PRN
  Administered 2019-12-17: 25 ug via INTRAVENOUS
  Administered 2019-12-17: 100 ug via INTRAVENOUS

## 2019-12-17 MED ORDER — FENTANYL CITRATE (PF) 100 MCG/2ML IJ SOLN
INTRAMUSCULAR | Status: AC | PRN
  Administered 2019-12-17 (×2): 25 ug via INTRAVENOUS
  Administered 2019-12-17: 50 ug via INTRAVENOUS

## 2019-12-17 MED ORDER — 0.9 % SODIUM CHLORIDE (POUR BTL) OPTIME
TOPICAL | Status: DC | PRN
  Administered 2019-12-17: 1000 mL

## 2019-12-17 MED ORDER — BUPIVACAINE HCL (PF) 0.5 % IJ SOLN
INTRAMUSCULAR | Status: AC
  Filled 2019-12-17: qty 30

## 2019-12-17 MED ORDER — ROCURONIUM BROMIDE 100 MG/10ML IV SOLN
INTRAVENOUS | Status: DC | PRN
  Administered 2019-12-17: 40 mg via INTRAVENOUS

## 2019-12-17 MED ORDER — HYDRALAZINE HCL 20 MG/ML IJ SOLN
10.0000 mg | Freq: Once | INTRAMUSCULAR | Status: AC
  Administered 2019-12-17: 10 mg via INTRAVENOUS

## 2019-12-17 MED ORDER — LIDOCAINE 2% (20 MG/ML) 5 ML SYRINGE
INTRAMUSCULAR | Status: AC
  Filled 2019-12-17: qty 5

## 2019-12-17 MED ORDER — EPHEDRINE SULFATE-NACL 50-0.9 MG/10ML-% IV SOSY
PREFILLED_SYRINGE | INTRAVENOUS | Status: DC | PRN
  Administered 2019-12-17: 5 mg via INTRAVENOUS

## 2019-12-17 MED ORDER — PHENYLEPHRINE HCL (PRESSORS) 10 MG/ML IV SOLN
INTRAVENOUS | Status: DC | PRN
  Administered 2019-12-17 (×2): 80 ug via INTRAVENOUS

## 2019-12-17 MED ORDER — FENTANYL CITRATE (PF) 100 MCG/2ML IJ SOLN
25.0000 ug | INTRAMUSCULAR | Status: DC | PRN
  Administered 2019-12-17: 50 ug via INTRAVENOUS

## 2019-12-17 MED ORDER — LIDOCAINE HCL (PF) 1 % IJ SOLN
INTRAMUSCULAR | Status: AC | PRN
  Administered 2019-12-17 (×2): 10 mL

## 2019-12-17 MED ORDER — CYCLOSPORINE 0.05 % OP EMUL
1.0000 [drp] | Freq: Two times a day (BID) | OPHTHALMIC | Status: DC
  Administered 2019-12-17 – 2019-12-18 (×2): 1 [drp] via OPHTHALMIC
  Filled 2019-12-17 (×3): qty 1

## 2019-12-17 MED ORDER — ONDANSETRON HCL 4 MG/2ML IJ SOLN
4.0000 mg | INTRAMUSCULAR | Status: DC | PRN
  Administered 2019-12-18: 4 mg via INTRAVENOUS
  Filled 2019-12-17: qty 2

## 2019-12-17 MED ORDER — IODIXANOL 320 MG/ML IV SOLN
50.0000 mL | Freq: Once | INTRAVENOUS | Status: AC | PRN
  Administered 2019-12-17: 20 mL via INTRAVENOUS

## 2019-12-17 MED ORDER — PROPOFOL 10 MG/ML IV BOLUS
INTRAVENOUS | Status: AC
  Filled 2019-12-17: qty 20

## 2019-12-17 MED ORDER — CARVEDILOL 6.25 MG PO TABS
6.2500 mg | ORAL_TABLET | Freq: Two times a day (BID) | ORAL | Status: DC
  Administered 2019-12-17 – 2019-12-18 (×2): 6.25 mg via ORAL
  Filled 2019-12-17 (×2): qty 1

## 2019-12-17 MED ORDER — ACETAMINOPHEN 500 MG PO TABS
1000.0000 mg | ORAL_TABLET | Freq: Four times a day (QID) | ORAL | Status: DC
  Administered 2019-12-17 – 2019-12-18 (×3): 1000 mg via ORAL
  Filled 2019-12-17 (×3): qty 2

## 2019-12-17 MED ORDER — CEFAZOLIN SODIUM-DEXTROSE 2-4 GM/100ML-% IV SOLN
INTRAVENOUS | Status: AC
  Filled 2019-12-17: qty 100

## 2019-12-17 MED ORDER — ONDANSETRON HCL 4 MG/2ML IJ SOLN
4.0000 mg | Freq: Once | INTRAMUSCULAR | Status: AC | PRN
  Administered 2019-12-17: 4 mg via INTRAVENOUS

## 2019-12-17 MED ORDER — AMLODIPINE BESYLATE 10 MG PO TABS
10.0000 mg | ORAL_TABLET | Freq: Every day | ORAL | Status: DC
  Administered 2019-12-18: 10 mg via ORAL
  Filled 2019-12-17: qty 1

## 2019-12-17 MED ORDER — FENTANYL CITRATE (PF) 100 MCG/2ML IJ SOLN
INTRAMUSCULAR | Status: AC
  Filled 2019-12-17: qty 2

## 2019-12-17 MED ORDER — MIDAZOLAM HCL 2 MG/2ML IJ SOLN
INTRAMUSCULAR | Status: AC
  Filled 2019-12-17: qty 2

## 2019-12-17 MED ORDER — ROCURONIUM BROMIDE 10 MG/ML (PF) SYRINGE
PREFILLED_SYRINGE | INTRAVENOUS | Status: AC
  Filled 2019-12-17: qty 10

## 2019-12-17 MED ORDER — HYDRALAZINE HCL 20 MG/ML IJ SOLN
INTRAMUSCULAR | Status: AC
  Filled 2019-12-17: qty 1

## 2019-12-17 MED ORDER — LIDOCAINE HCL 1 % IJ SOLN
INTRAMUSCULAR | Status: AC
  Filled 2019-12-17: qty 20

## 2019-12-17 MED ORDER — SODIUM CHLORIDE 0.9 % IR SOLN
Status: DC | PRN
  Administered 2019-12-17: 6000 mL

## 2019-12-17 MED ORDER — BACITRACIN-NEOMYCIN-POLYMYXIN 400-5-5000 EX OINT: 1.0000 "application " | TOPICAL_OINTMENT | Freq: Three times a day (TID) | CUTANEOUS | Status: DC | PRN

## 2019-12-17 MED ORDER — SUGAMMADEX SODIUM 200 MG/2ML IV SOLN
INTRAVENOUS | Status: DC | PRN
  Administered 2019-12-17: 200 mg via INTRAVENOUS

## 2019-12-17 MED ORDER — FENTANYL CITRATE (PF) 250 MCG/5ML IJ SOLN
INTRAMUSCULAR | Status: AC
  Filled 2019-12-17: qty 5

## 2019-12-17 MED ORDER — HYDROMORPHONE HCL 1 MG/ML IJ SOLN
0.5000 mg | INTRAMUSCULAR | Status: DC | PRN
  Administered 2019-12-17 – 2019-12-18 (×4): 1 mg via INTRAVENOUS
  Filled 2019-12-17 (×4): qty 1

## 2019-12-17 MED ORDER — IOHEXOL 300 MG/ML  SOLN
50.0000 mL | Freq: Once | INTRAMUSCULAR | Status: AC | PRN
  Administered 2019-12-17: 10 mL

## 2019-12-17 MED ORDER — DUTASTERIDE 0.5 MG PO CAPS
0.5000 mg | ORAL_CAPSULE | Freq: Every day | ORAL | Status: DC
  Administered 2019-12-18: 0.5 mg via ORAL
  Filled 2019-12-17: qty 1

## 2019-12-17 MED ORDER — LIDOCAINE HCL (CARDIAC) PF 100 MG/5ML IV SOSY
PREFILLED_SYRINGE | INTRAVENOUS | Status: DC | PRN
  Administered 2019-12-17: 100 mg via INTRAVENOUS

## 2019-12-17 MED ORDER — ONDANSETRON HCL 4 MG/2ML IJ SOLN
INTRAMUSCULAR | Status: DC | PRN
  Administered 2019-12-17: 4 mg via INTRAVENOUS

## 2019-12-17 MED ORDER — DEXAMETHASONE SODIUM PHOSPHATE 10 MG/ML IJ SOLN
INTRAMUSCULAR | Status: DC | PRN
  Administered 2019-12-17: 4 mg via INTRAVENOUS

## 2019-12-17 MED ORDER — DEXAMETHASONE SODIUM PHOSPHATE 10 MG/ML IJ SOLN
INTRAMUSCULAR | Status: AC
  Filled 2019-12-17: qty 1

## 2019-12-17 SURGICAL SUPPLY — 56 items
AGENT HMST KT MTR STRL THRMB (HEMOSTASIS) ×1
APL ESCP 34 STRL LF DISP (HEMOSTASIS) ×1
APL PRP STRL LF DISP 70% ISPRP (MISCELLANEOUS) ×1
APL SKNCLS STERI-STRIP NONHPOA (GAUZE/BANDAGES/DRESSINGS) ×1
APPLICATOR SURGIFLO ENDO (HEMOSTASIS) ×1 IMPLANT
BAG DRN RND TRDRP ANRFLXCHMBR (UROLOGICAL SUPPLIES)
BAG URINE DRAIN 2000ML AR STRL (UROLOGICAL SUPPLIES) IMPLANT
BASKET ZERO TIP NITINOL 2.4FR (BASKET) IMPLANT
BENZOIN TINCTURE PRP APPL 2/3 (GAUZE/BANDAGES/DRESSINGS) ×2 IMPLANT
BLADE SURG 15 STRL LF DISP TIS (BLADE) ×1 IMPLANT
BLADE SURG 15 STRL SS (BLADE) ×2
BSKT STON RTRVL ZERO TP 2.4FR (BASKET)
CATH FOLEY 2W COUNCIL 20FR 5CC (CATHETERS) IMPLANT
CATH FOLEY 2WAY SLVR  5CC 18FR (CATHETERS)
CATH FOLEY 2WAY SLVR 5CC 18FR (CATHETERS) IMPLANT
CATH IMAGER II 65CM (CATHETERS) IMPLANT
CATH X-FORCE N30 NEPHROSTOMY (TUBING) ×2 IMPLANT
CHLORAPREP W/TINT 26 (MISCELLANEOUS) ×2 IMPLANT
COVER SURGICAL LIGHT HANDLE (MISCELLANEOUS) ×2 IMPLANT
COVER WAND RF STERILE (DRAPES) IMPLANT
DRAPE C-ARM 42X120 X-RAY (DRAPES) ×2 IMPLANT
DRAPE LINGEMAN PERC (DRAPES) ×2 IMPLANT
DRAPE SURG IRRIG POUCH 19X23 (DRAPES) ×2 IMPLANT
DRSG PAD ABDOMINAL 8X10 ST (GAUZE/BANDAGES/DRESSINGS) ×4 IMPLANT
DRSG TEGADERM 8X12 (GAUZE/BANDAGES/DRESSINGS) IMPLANT
FIBER LASER TRAC TIP (UROLOGICAL SUPPLIES) IMPLANT
GAUZE SPONGE 4X4 12PLY STRL (GAUZE/BANDAGES/DRESSINGS) ×2 IMPLANT
GLOVE BIOGEL M 8.0 STRL (GLOVE) ×2 IMPLANT
GOWN STRL REUS W/TWL XL LVL3 (GOWN DISPOSABLE) ×2 IMPLANT
GUIDEWIRE AMPLAZ .035X145 (WIRE) ×4 IMPLANT
GUIDEWIRE STR DUAL SENSOR (WIRE) IMPLANT
HOLDER FOLEY CATH W/STRAP (MISCELLANEOUS) ×2 IMPLANT
KIT BASIN OR (CUSTOM PROCEDURE TRAY) ×2 IMPLANT
KIT PROBE 340X3.4XDISP GRN (MISCELLANEOUS) IMPLANT
KIT PROBE TRILOGY 3.4X340 (MISCELLANEOUS)
KIT PROBE TRILOGY 3.9X350 (MISCELLANEOUS) IMPLANT
KIT TURNOVER KIT A (KITS) IMPLANT
MANIFOLD NEPTUNE II (INSTRUMENTS) ×2 IMPLANT
NS IRRIG 1000ML POUR BTL (IV SOLUTION) ×2 IMPLANT
PACK CYSTO (CUSTOM PROCEDURE TRAY) ×2 IMPLANT
SHEATH PEELAWAY SET 9 (SHEATH) ×2 IMPLANT
SPONGE LAP 4X18 RFD (DISPOSABLE) ×2 IMPLANT
STENT URET 6FRX24 CONTOUR (STENTS) ×1 IMPLANT
STOPCOCK 4 WAY LG BORE MALE ST (IV SETS) ×2 IMPLANT
SURGIFLO W/THROMBIN 8M KIT (HEMOSTASIS) ×2 IMPLANT
SUT MNCRL AB 4-0 PS2 18 (SUTURE) ×1 IMPLANT
SUT SILK 2 0 30  PSL (SUTURE)
SUT SILK 2 0 30 PSL (SUTURE) IMPLANT
SYR 10ML LL (SYRINGE) ×2 IMPLANT
SYR 20ML LL LF (SYRINGE) ×4 IMPLANT
TOWEL OR 17X26 10 PK STRL BLUE (TOWEL DISPOSABLE) ×2 IMPLANT
TOWEL OR NON WOVEN STRL DISP B (DISPOSABLE) ×2 IMPLANT
TRAY FOLEY MTR SLVR 16FR STAT (SET/KITS/TRAYS/PACK) ×2 IMPLANT
TUBING CONNECTING 10 (TUBING) ×4 IMPLANT
TUBING STONE CATCHER TRILOGY (MISCELLANEOUS) IMPLANT
TUBING UROLOGY SET (TUBING) ×2 IMPLANT

## 2019-12-17 NOTE — Op Note (Addendum)
PATIENT:  Jared [name]  PRE-OPERATIVE DIAGNOSIS: Left renal calculus  POST-OPERATIVE DIAGNOSIS: Same  PROCEDURE: 1. Percutaneous nephrostomy sheath placement. 2.  Left percutaneous nephrolithotomy (1.5cm.) 3. Antegrade double-J stent placement, left. 4.  Fluoroscopy greater than 1 hour and less than 2 hours  SURGEON:  Claybon Jabs  INDICATION: Jared [name] is a 79 year old male with a history of calculus disease who developed left flank pain that was intermittent and associated with nausea as well as gross hematuria.  He is on Xarelto chronically.  A CT scan revealed a 1.5 cm stone in the left renal pelvis with some thickening of the renal pelvis consistent with inflammation/irritation and almost certainly the source of his intermittent gross hematuria.  We discussed the options for management and he is elected to proceed with PCNL.  ANESTHESIA:  General  EBL:  Minimal  DRAINS: 6 French, 24 cm double-J stent in the left ureter  LOCAL MEDICATIONS USED:  None  SPECIMEN: Stone given to patient  Description of procedure: After informed consent the patient was taken to the operating room and administered general endotracheal anesthesia. Once fully anesthetized the patient had an 52 French Foley catheter placed It was then moved from the stretcher onto the operating room table in a prone position with bony prominences padded and chest pads in place. The flank with exiting nephrostomy catheter was then sterilely prepped and draped in standard fashion. An official timeout was then performed.  Using the existing nephrostomy catheter access I passed a 0.038 inch floppy tipped guidewire down the ureter into the bladder under fluoroscopy.  This was left in place and the nephrostomy catheter was removed.  A transverse incision was made over the guidewire and a peel-away coaxial catheter was then passed over the guidewire and down the ureter under fluoroscopy.  The inner portion of the coaxial system  was then removed and a second guidewire was passed through this catheter and down the ureter into the bladder under fluoroscopy.  The coaxial catheter was then removed and one of the guidewires was secured to the drape as a safety guidewire and the second guidewire was used as a working guidewire.  The NephroMax nephrostomy dilating balloon was then passed over the working guidewire into the area of the renal pelvis under fluoroscopy.  It was then inflated using dilute contrast under fluoroscopy until the balloon was fully inflated.  I then passed the 28 French nephrostomy access sheath over the balloon into the area of the renal pelvis under fluoroscopy and then deflated the balloon and removed the dilating balloon.  The 78 French rigid nephroscope was then passed under direct vision through the nephrostomy access sheath.  Clotted blood was evacuated and the stone was identified.  I was able to grasp the stone with a 3 prong grasper.  This caused the stone to break into 2 pieces.  I pulled out the smaller piece and then was able to orient the second piece along the long axis of the nephrostomy sheath and was able to extract it in toto.  Reinspection revealed all of the stone had been removed.  There were no further fragments.  There was minimal bleeding throughout the case.  I therefore elected to proceed with stent placement.  The working guidewire was passed through the nephroscope and the stent passed over the guidewire and into the bladder.  With fluoroscopy as I remove the guidewire and noted good curl in the bladder.  There was good curl noted in the area  of the renal pelvis.  I then measured the distance to the renal parenchyma and marked this on the laparoscopic introducer.  The safety guidewire was then removed and I injected FloSeal through the laparoscopic introducer initially at the level measured previously to place the material at the level of the renal parenchyma and then backed this on out  through the nephrostomy tract with the nephrostomy sheath.  I injected half percent Marcaine in the nephrostomy incision and then closed the incision with a running, subcuticular 4-0 Monocryl suture.  Sterile 4 x 4's were applied as well as a Tegaderm and the patient was awakened and taken to the recovery room in stable and satisfactory condition.  He tolerated the procedure well with no intraoperative complications.    PLAN OF CARE: Discharge to home after an overnight stay.  PATIENT DISPOSITION:  PACU - hemodynamically stable.

## 2019-12-17 NOTE — Plan of Care (Signed)
Bed alarm set. Call bell and belongings are within reach. Pt will call for assistance when needing to get out of bed.

## 2019-12-17 NOTE — Transfer of Care (Signed)
Immediate Anesthesia Transfer of Care Note  Patient: Jared [name]  Procedure(s) Performed: NEPHROLITHOTOMY PERCUTANEOUS (Left )  Patient Location: PACU  Anesthesia Type:General  Level of Consciousness: drowsy, patient cooperative and responds to stimulation  Airway & Oxygen Therapy: Patient Spontanous Breathing and Patient connected to face mask oxygen  Post-op Assessment: Report given to RN and Post -op Vital signs reviewed and stable  Post vital signs: Reviewed and stable  Last Vitals:  Vitals Value Taken Time  BP 170/100 12/17/19 1228  Temp    Pulse 64 12/17/19 1229  Resp 23 12/17/19 1229  SpO2 99 % 12/17/19 1229  Vitals shown include unvalidated device data.  Last Pain:  Vitals:   12/17/19 1059  TempSrc:   PainSc: 4          Complications: No apparent anesthesia complications

## 2019-12-17 NOTE — Anesthesia Postprocedure Evaluation (Signed)
Anesthesia Post Note  Patient: Jared [name]  Procedure(s) Performed: NEPHROLITHOTOMY PERCUTANEOUS (Left )     Patient location during evaluation: PACU Anesthesia Type: General Level of consciousness: awake and alert Pain management: pain level controlled Vital Signs Assessment: post-procedure vital signs reviewed and stable Respiratory status: spontaneous breathing, nonlabored ventilation, respiratory function stable and patient connected to nasal cannula oxygen Cardiovascular status: blood pressure returned to baseline and stable Postop Assessment: no apparent nausea or vomiting Anesthetic complications: no    Last Vitals:  Vitals:   12/17/19 1059 12/17/19 1230  BP: (!) 175/92 (!) 166/104  Pulse: 66 64  Resp: 12 (!) 23  Temp:  (!) 36.4 C  SpO2: 98% 99%    Last Pain:  Vitals:   12/17/19 1230  TempSrc:   PainSc: [place] A Saraiya Kozma

## 2019-12-17 NOTE — Anesthesia Procedure Notes (Signed)
Procedure Name: Intubation Date/Time: 12/17/2019 11:16 AM Performed by: Glory Buff, CRNA Pre-anesthesia Checklist: Patient identified, Emergency Drugs available, Suction available and Patient being monitored Patient Re-evaluated:Patient Re-evaluated prior to induction Oxygen Delivery Method: Circle system utilized Preoxygenation: Pre-oxygenation with 100% oxygen Induction Type: IV induction Ventilation: Mask ventilation without difficulty Laryngoscope Size: Mac and 4 Grade View: Grade I Tube type: Oral Tube size: 7.5 mm Number of attempts: 1 Airway Equipment and Method: Stylet and Oral airway Placement Confirmation: ETT inserted through vocal cords under direct vision,  positive ETCO2 and breath sounds checked- equal and bilateral Secured at: 23 cm Tube secured with: Tape Dental Injury: Teeth and Oropharynx as per pre-operative assessment  Comments: Intubated by C. Devenger, SRNA

## 2019-12-17 NOTE — Consult Note (Signed)
Chief Complaint: Patient was seen in consultation today for left percutaneous nephrostomy/nephroureteral catheter placement  Referring Physician(s): Ottelin,M  Supervising Physician: Aletta Edouard  Patient Status: [facility] - Out-pt  TBA  History of Present Illness: Jared [name] is a 79 y.o. male with significant past medical history including BPH, diverticulosis, GERD, prior CVA, hypertension, CAD,ischemic cardiomyopathy, depression, osteoarthritis, obstructive sleep apnea, paroxysmal atrial fibrillation, and loop recorder insertion.  He also has a history of nephrolithiasis with some intermittent left flank pain and prior hematuria and recent CT in February of this year revealing a 1.5 cm calculus in the left renal pelvis with extensive thickening of the renal pelvis and adjacent fat stranding.  He presents today for left percutaneous nephrostomy/nephroureteral catheter placement prior to nephrolithotomy. Past Medical History:  Diagnosis Date  . Allergic rhinitis   . Anticoagulant long-term use    xarelto  . Anxiety   . Benign localized prostatic hyperplasia with lower urinary tract symptoms (LUTS)   . Benzodiazepine dependence ([place])   . Constipation   . Diverticulosis   . Edema of right lower extremity   . GERD (gastroesophageal reflux disease)   . Heart murmur   . Hematuria   . Hepatitis    History of  . History of colonic polyps   . History of CVA (cerebrovascular accident) without residual deficits 07/26/2017   crytogenic stroke -- acute left caudate body infarct   . History of kidney stones   . Hypertension   . Ischemic cardiomyopathy 07/26/2017   ef 35-40%;    TEE 07-31-2017 ef 60-65%  . Left anterior fascicular block 02/26/2019   Noted on EKG  . LVH (left ventricular hypertrophy) 02/26/2019   Noted on EKG   . MDD (major depressive disorder), recurrent, in partial remission ([place])   . OA (osteoarthritis)    knees,   . OSA (obstructive sleep apnea)    intolerant  cpap  . PAF (paroxysmal atrial fibrillation) [facility])    cardiologist-  dr g. taylor  . Skin cancer    Childhood, right foot on toe  . Status post placement of implantable loop recorder 07/31/2017  . Stroke ([place])   . Swelling of right foot 12/2018  . Throat mass   . Wears glasses     Past Surgical History:  Procedure Laterality Date  . APPENDECTOMY  1970s  . CATARACT EXTRACTION W/ INTRAOCULAR LENS  IMPLANT, BILATERAL Bilateral 2014  . CHOLECYSTECTOMY  1970s  . COLONOSCOPY  2014  . CYSTOSCOPY    . FEMUR IM NAIL Right 02/27/2019   Procedure: INTRAMEDULLARY (IM) NAIL FEMORAL;  Surgeon: Rod Can, MD;  Location: [facility];  Service: Orthopedics;  Laterality: Right;  . FOOT SURGERY Left yrs ago  . HERNIA REPAIR  2014  . LOOP RECORDER INSERTION N/A 07/31/2017   Procedure: LOOP RECORDER INSERTION;  Surgeon: Evans Lance, MD;  Location: [facility];  Service: Cardiovascular;  Laterality: N/A;  . TEE WITHOUT CARDIOVERSION N/A 07/31/2017   Procedure: TRANSESOPHAGEAL ECHOCARDIOGRAM (TEE) WITH LOOP;  Surgeon: Dorothy Spark, MD;  Location: [facility];  Service: Cardiovascular;  Laterality: N/A;  . TOTAL KNEE ARTHROPLASTY Left 02/22/2019   Procedure: TOTAL KNEE ARTHROPLASTY;  Surgeon: [name], MD;  Location: [facility];  Service: Orthopedics;  Laterality: Left;  . WRIST FRACTURE SURGERY  1970s   right x2  unsure if any hardware remain   ;    left x1 with pinning then removed    Allergies: Antihistamines, diphenhydramine-type; Aspirin; Codeine; and Pollen extract  Medications: Prior to Admission medications   Medication Sig Start Date End Date Taking? Authorizing Provider  ALPRAZolam Duanne Moron) 1 MG tablet Take 0.5 tablets (0.5 mg total) by mouth 3 (three) times daily as needed for anxiety. 11/05/19  Yes [name], MD  amLODipine (NORVASC) 10 MG tablet Take 1 tablet (10 mg total) by mouth daily. Patient taking differently: Take 10 mg by mouth every morning.  10/29/19  Yes  [name], MD  buPROPion (WELLBUTRIN SR) 150 MG 12 hr tablet Take 1 tablet (150 mg total) by mouth 2 (two) times daily. 11/05/19  Yes [name], MD  carvedilol (COREG) 6.25 MG tablet Take 1 tablet (6.25 mg total) by mouth 2 (two) times daily with a meal. 11/05/19  Yes [name], MD  carvedilol (COREG) 6.25 MG tablet Take 6.25 mg by mouth 2 (two) times daily with a meal.   Yes [provider]  dutasteride (AVODART) 0.5 MG capsule TAKE 1 CAPSULE BY MOUTH EVERY DAY Patient taking differently: Take 0.5 mg by mouth every morning.  09/03/19  Yes [name], MD  mirtazapine (REMERON) 15 MG tablet TAKE 1 TABLET BY MOUTH EVERYDAY AT BEDTIME Patient taking differently: Take 15 mg by mouth at bedtime.  08/05/19  Yes [name], MD  Propylene Glycol (SYSTANE BALANCE) 0.6 % SOLN Place 1 drop into both eyes daily as needed (dry eyes).   Yes [provider]  RESTASIS 0.05 % ophthalmic emulsion Place 1 drop 2 (two) times daily into both eyes. 08/03/17  Yes [provider]  timolol (TIMOPTIC) 0.5 % ophthalmic solution Place 1 drop into both eyes 2 (two) times daily.  06/29/17  Yes [provider]  valsartan (DIOVAN) 80 MG tablet Take 1 tablet (80 mg total) by mouth daily. 10/29/19  Yes [name], MD  XARELTO 20 MG TABS tablet TAKE 1 TAB BY MOUTH DAILY WITH SUPPER. OVERDUE FOR FOLLOW-UP, PT MUST MAKE APPT FOR FUTURE REFILLS. Patient taking differently: Take 20 mg by mouth daily with supper.  08/21/19  Yes Evans Lance, MD  bisacodyl (DULCOLAX) 10 MG suppository Place 1 suppository (10 mg total) rectally daily as needed for moderate constipation. Patient not taking: Reported on 12/06/2019 03/05/19   Arrien, Jimmy Picket, MD     Family History  Problem Relation Age of Onset  . Heart disease Mother   . Heart disease Father   . Colon cancer Maternal Aunt 70    Social History   Socioeconomic History  . Marital status: Widowed     Spouse name: Not on file  . Number of children: 2  . Years of education: Not on file  . Highest education level: Not on file  Occupational History  . Occupation: Nurse, learning disability    Comment: retired  Tobacco Use  . Smoking status: Never Smoker  . Smokeless tobacco: Never Used  Substance and Sexual Activity  . Alcohol use: No  . Drug use: Never  . Sexual activity: Not on file  Other Topics Concern  . Not on file  Social History Narrative   Divorced twice---first wife died   2 children---daughter local, son in [place]      Has living will   [place] is daughter   Would accept resuscitation   No prolonged tube feeds if cognitively unaware   Social Determinants of Health   Financial Resource Strain:   . Difficulty of Paying Living Expenses:   Food Insecurity:   . Worried  About Running Out of Food in the Last Year:   . [place] in the Last Year:   Transportation Needs:   . Lack of Transportation (Medical):   Marland Kitchen Lack of Transportation (Non-Medical):   Physical Activity:   . Days of Exercise per Week:   . Minutes of Exercise per Session:   Stress:   . Feeling of Stress :   Social Connections:   . Frequency of Communication with Friends and Family:   . Frequency of Social Gatherings with Friends and Family:   . Attends Religious Services:   . Active Member of Clubs or Organizations:   . Attends Archivist Meetings:   Marland Kitchen Marital Status:       Review of Systems see above ;currently denies fever, headache, chest pain, dyspnea, cough, abdominal pain, nausea, vomiting.  Vital Signs: BP (!) 188/92   Pulse (!) 57   Temp 98.6 F (37 C) (Oral)   Resp 15   Wt 202 lb 2.6 oz (91.7 kg)   SpO2 97%   BMI 29.01 kg/m   Physical Exam awake, alert.  Chest clear to auscultation bilaterally.  Heart with bradycardic rhythm and pause following every third beat.  Loop recorder noted in left upper chest region.  Abdomen soft, positive bowel sounds, nontender.   Trace pretibial edema bilaterally.  Imaging: CUP PACEART REMOTE DEVICE CHECK  Result Date: 11/28/2019 [facility] summary report received. Battery status OK. Normal device function. No new symptom episodes, tachy episodes, brady, or pause episodes. No new AF episodes. Monthly summary reports and ROV/PRN Kathy Breach, RN, CCDS, [employer]   Labs:  CBC: Recent Labs    06/17/19 1456 08/12/19 1023 12/13/19 0833 12/17/19 0800  WBC 5.6 7.1 5.8 6.2  HGB 13.5 13.9 13.8 13.6  HCT 40.0 41.4 41.5 40.7  PLT 206.0 205 175 170    COAGS: Recent Labs    02/26/19 0441  INR 2.0*  APTT 45*    BMP: Recent Labs    02/28/19 0507 02/28/19 0507 03/01/19 0756 06/17/19 1456 08/12/19 1023 12/13/19 0833  NA 140   < > 140 140 141 144  K 4.1   < > 3.5 3.9 4.2 4.7  CL 108   < > 106 104 104 109  CO2 24   < > 24 28 26 27   GLUCOSE 162*   < > 123* 95 85 118*  BUN 31*   < > 30* 19 20 19   CALCIUM 8.7*   < > 8.3* 9.7 9.7 9.6  CREATININE 0.86   < > 0.82 1.08 1.25 1.09  GFRNONAA >60  --  >60  --  56* >60  GFRAA >60  --  >60  --  64 >60   < > = values in this interval not displayed.    LIVER FUNCTION TESTS: Recent Labs    06/17/19 1456 12/13/19 0833  BILITOT 0.6 0.6  AST 19 25  ALT 19 30  ALKPHOS 126* 99  PROT 7.3 7.5  ALBUMIN 4.0 3.9    TUMOR MARKERS: No results for input(s): AFPTM, CEA, CA199, CHROMGRNA in the last 8760 hours.  Assessment and Plan: 79 y.o. male with significant past medical history including BPH, diverticulosis, GERD, prior CVA, hypertension, CAD,ischemic cardiomyopathy, depression, osteoarthritis, obstructive sleep apnea, paroxysmal atrial fibrillation and loop recorder insertion.  He also has a history of nephrolithiasis with some intermittent left flank pain and prior hematuria and recent CT in February of this year revealing a 1.5 cm  calculus in the left renal pelvis with extensive thickening of the renal pelvis and adjacent fat stranding.  He presents today  for left percutaneous nephrostomy/nephroureteral catheter placement prior to nephrolithotomy.Risks and benefits of left PCN placement was discussed with the patient including, but not limited to, infection, bleeding, significant bleeding causing loss or decrease in renal function or damage to adjacent structures.   All of the patient's questions were answered, patient is agreeable to proceed.  Consent signed and in chart.      Thank you for this interesting consult.  I greatly enjoyed meeting Jared [name] and look forward to participating in their care.  A copy of this report was sent to the requesting provider on this date.  Electronically Signed: D. Rowe Robert, PA-C 12/17/2019, 8:44 AM   I spent a total of 25 minutes    in face to face in clinical consultation, greater than 50% of which was counseling/coordinating care for left percutaneous nephrostomy/nephroureteral catheter placement

## 2019-12-17 NOTE — Procedures (Signed)
Interventional Radiology Procedure Note  Procedure: Left ureteral catheter placement via percutaneous access  Complications: None  Estimated Blood Loss: < 10 mL  Findings: No hydronephrosis of left kidney; left renal pelvic calculus visible by fluoro.  Could not obtain access to collecting system by passage of 21 G needle under US guidance or attempt at direct puncture of renal pelvis under fluoro.  Able to gain access to LP calyx after administration of 70 mL Visipaque via peripheral IV. 5 Fr catheter advanced along undersurface of pelvic stone and down ureter into bladder.  For PCNL in OR with Dr. Karsten Ro later today.  Venetia Night. Kathlene Cote, M.D Pager:  [phone]

## 2019-12-17 NOTE — Anesthesia Preprocedure Evaluation (Signed)
Anesthesia Evaluation  Patient identified by MRN, date of birth, ID band Patient awake    Reviewed: Allergy & Precautions, NPO status , Patient's Chart, lab work & pertinent test results  Airway Mallampati: II  TM Distance: >3 FB Neck ROM: Full    Dental no notable dental hx. (+) Teeth Intact, Dental Advisory Given   Pulmonary sleep apnea ,    Pulmonary exam normal breath sounds clear to auscultation       Cardiovascular hypertension, Normal cardiovascular exam+ pacemaker  Rhythm:Regular Rate:Normal  07/31/2017 TEE Left ventricle: Systolic function was normal. The estimated  ejection fraction was in the range of 60% to 65%. Wall motion was  normal; there were no regional wall motion abnormalities.  - Aorta: There was severe non-mobile atheroma.  - Ascending aorta: The ascending aorta was normal in size.  - Descending aorta: The descending aorta was normal in size.  - Mitral valve: There was mild regurgitation.  - Left atrium: No evidence of thrombus in the atrial cavity or  appendage. No evidence of thrombus in the atrial cavity or  appendage.    Neuro/Psych    GI/Hepatic GERD  Medicated,  Endo/Other  negative endocrine ROS  Renal/GU K+ 4.7 Cr 1.09     Musculoskeletal  (+) Arthritis ,   Abdominal   Peds  Hematology negative hematology ROS (+)   Anesthesia Other Findings   Reproductive/Obstetrics negative OB ROS                             Anesthesia Physical Anesthesia Plan  ASA: II  Anesthesia Plan: General   Post-op Pain Management:    Induction: Intravenous  PONV Risk Score and Plan: 2 and Treatment may vary due to age or medical condition, Ondansetron and Dexamethasone  Airway Management Planned: Oral ETT  Additional Equipment: None  Intra-op Plan:   Post-operative Plan: Extubation in OR  Informed Consent: I have reviewed the patients History and Physical,  chart, labs and discussed the procedure including the risks, benefits and alternatives for the proposed anesthesia with the patient or authorized representative who has indicated his/her understanding and acceptance.     Dental advisory given  Plan Discussed with:   Anesthesia Plan Comments:         Anesthesia Quick Evaluation

## 2019-12-18 DIAGNOSIS — R351 Nocturia: Secondary | ICD-10-CM | POA: Diagnosis not present

## 2019-12-18 DIAGNOSIS — F419 Anxiety disorder, unspecified: Secondary | ICD-10-CM | POA: Diagnosis not present

## 2019-12-18 DIAGNOSIS — N281 Cyst of kidney, acquired: Secondary | ICD-10-CM | POA: Diagnosis not present

## 2019-12-18 DIAGNOSIS — N200 Calculus of kidney: Secondary | ICD-10-CM | POA: Diagnosis not present

## 2019-12-18 DIAGNOSIS — Z87442 Personal history of urinary calculi: Secondary | ICD-10-CM | POA: Diagnosis not present

## 2019-12-18 DIAGNOSIS — Z7901 Long term (current) use of anticoagulants: Secondary | ICD-10-CM | POA: Diagnosis not present

## 2019-12-18 DIAGNOSIS — N3941 Urge incontinence: Secondary | ICD-10-CM | POA: Diagnosis not present

## 2019-12-18 DIAGNOSIS — Z79899 Other long term (current) drug therapy: Secondary | ICD-10-CM | POA: Diagnosis not present

## 2019-12-18 DIAGNOSIS — R310 Gross hematuria: Secondary | ICD-10-CM | POA: Diagnosis not present

## 2019-12-18 MED ORDER — CHLORHEXIDINE GLUCONATE CLOTH 2 % EX PADS
6.0000 | MEDICATED_PAD | Freq: Every day | CUTANEOUS | Status: DC
  Administered 2019-12-18: 6 via TOPICAL

## 2019-12-18 MED ORDER — METOPROLOL TARTRATE 5 MG/5ML IV SOLN
5.0000 mg | INTRAVENOUS | Status: AC | PRN
  Administered 2019-12-18: 5 mg via INTRAVENOUS
  Filled 2019-12-18: qty 5

## 2019-12-18 NOTE — Progress Notes (Signed)
Patient reports feeling some better. Provider contacted, Bladder scan 6 cc post void of 100 cc. Urine is dark red clear. Urology Provider updated, will proceed with discharge.

## 2019-12-18 NOTE — Progress Notes (Addendum)
Patient remains stable, A&Ox4 and ambulatory with use of cane. Discharge instructions reviewed with patient and daughter. Daughter encouraged to contact provider office for xarelto recommendations.Questions and or concerns were denied at this time. Supplies for dressing , urinal and IS sent with pt.

## 2019-12-18 NOTE — Discharge Summary (Signed)
Physician Discharge Summary      Patient ID: RAYMUNDO PINALES MRN: [id number] DOB/AGE: 04-30-1941 79 y.o.  Admit date: 12/17/2019 Discharge date: 12/18/2019  Admission Diagnoses: Renal calculus, left [N20.0]  Discharge Diagnoses:  Active Problems:   Renal calculus, left   Discharged Condition: good  Hospital Course: Mr. Chinnock was admitted for elective left PCNL.  He underwent his procedure on [id number] without complication.  All of his stone was removed.  A stent was left after the procedure. He was monitored overnight in the hospital and had minimal discomfort which has been controlled.  He is tolerating regular diet.  His urine is just slightly pink and free of clots.  He is a full felt to be ready for discharge at this time.  Discharge Exam: Blood pressure (!) 167/101, pulse 77, temperature 97.9 F (36.6 C), temperature source Oral, resp. rate 20, weight 91.7 kg, SpO2 98 %. General: Awake, alert and in no apparent distress. Chest: Normal respiratory effort. Cardiovascular: Regular rate and rhythm. Abdomen: Soft, nontender, nondistended. Back:  His left flank is free of ecchymoses or erythema.  He has a small dressing in place that is dry and intact.  Disposition: Discharge disposition: 01-Home or Self Care       Discharge Instructions    Discharge patient   Complete by: As directed    Discharge disposition: 01-Home or Self Care   Discharge patient date: 12/18/2019   Foley catheter - discontinue   Complete by: As directed      Allergies as of 12/18/2019      Reactions   Antihistamines, Diphenhydramine-type    Blood in urine    Aspirin    Blood in urine    Codeine Nausea And Vomiting   Must take with food   Pollen Extract Cough      Medication List    TAKE these medications   ALPRAZolam 1 MG tablet Commonly known as: XANAX Take 0.5 tablets (0.5 mg total) by mouth 3 (three) times daily as needed for anxiety.   amLODipine 10 MG tablet Commonly known as:  NORVASC Take 1 tablet (10 mg total) by mouth daily. What changed: when to take this   bisacodyl 10 MG suppository Commonly known as: DULCOLAX Place 1 suppository (10 mg total) rectally daily as needed for moderate constipation.   buPROPion 150 MG 12 hr tablet Commonly known as: WELLBUTRIN SR Take 1 tablet (150 mg total) by mouth 2 (two) times daily.   carvedilol 6.25 MG tablet Commonly known as: COREG Take 6.25 mg by mouth 2 (two) times daily with a meal.   carvedilol 6.25 MG tablet Commonly known as: COREG Take 1 tablet (6.25 mg total) by mouth 2 (two) times daily with a meal.   dutasteride 0.5 MG capsule Commonly known as: AVODART TAKE 1 CAPSULE BY MOUTH EVERY DAY What changed:   how much to take  when to take this   mirtazapine 15 MG tablet Commonly known as: REMERON TAKE 1 TABLET BY MOUTH EVERYDAY AT BEDTIME What changed: See the new instructions.   Restasis 0.05 % ophthalmic emulsion Generic drug: cycloSPORINE Place 1 drop 2 (two) times daily into both eyes.   Systane Balance 0.6 % Soln Generic drug: Propylene Glycol Place 1 drop into both eyes daily as needed (dry eyes).   timolol 0.5 % ophthalmic solution Commonly known as: TIMOPTIC Place 1 drop into both eyes 2 (two) times daily.   valsartan 80 MG tablet Commonly known as: DIOVAN Take 1 tablet (80 mg  total) by mouth daily.   Xarelto 20 MG Tabs tablet Generic drug: rivaroxaban TAKE 1 TAB BY MOUTH DAILY WITH SUPPER. OVERDUE FOR FOLLOW-UP, PT MUST MAKE APPT FOR FUTURE REFILLS. What changed: See the new instructions.      Follow-up Information    [facility] On 12/24/2019.   Why: For your appiontment at 8:00 Contact information: [place]          Signed: Claybon Jabs 12/18/2019, 7:21 AM

## 2019-12-18 NOTE — Progress Notes (Signed)
   12/18/19 0153  Vitals  BP (!) 167/101  MAP (mmHg) 119  BP Method Automatic  Pulse Rate 77  MEWS Score  MEWS Temp 0  MEWS Systolic 0  MEWS Pulse 0  MEWS RR 0  MEWS LOC 0  MEWS Score 0  MEWS Score Color Green  MEWS Assessment  Is this an acute change? No (PT baseline for more than 24 hrs)  Provider Notification  Provider Name/Title Sharlet Salina , MD  Date Provider Notified 12/18/19  Time Provider Notified 0200  Notification Type Page  Notification Reason Other (Comment) (MD made aware)  Response No new orders   MD made aware. Pt has had such high BP for more than 24 hrs and moved between yellow and green mews for two days now. Managing Pt pain and monitoring.

## 2019-12-27 DIAGNOSIS — N200 Calculus of kidney: Secondary | ICD-10-CM | POA: Diagnosis not present

## 2019-12-30 ENCOUNTER — Ambulatory Visit (INDEPENDENT_AMBULATORY_CARE_PROVIDER_SITE_OTHER): Payer: Medicare HMO | Admitting: *Deleted

## 2019-12-30 DIAGNOSIS — I480 Paroxysmal atrial fibrillation: Secondary | ICD-10-CM

## 2019-12-30 LAB — CUP PACEART REMOTE DEVICE CHECK
Date Time Interrogation Session: 20210411034939
Implantable Pulse Generator Implant Date: 20181112

## 2019-12-30 NOTE — Progress Notes (Signed)
ILR Remote 

## 2020-01-11 DIAGNOSIS — M1712 Unilateral primary osteoarthritis, left knee: Secondary | ICD-10-CM | POA: Diagnosis not present

## 2020-01-11 DIAGNOSIS — R2689 Other abnormalities of gait and mobility: Secondary | ICD-10-CM | POA: Diagnosis not present

## 2020-01-11 DIAGNOSIS — R278 Other lack of coordination: Secondary | ICD-10-CM | POA: Diagnosis not present

## 2020-01-11 DIAGNOSIS — Z8673 Personal history of transient ischemic attack (TIA), and cerebral infarction without residual deficits: Secondary | ICD-10-CM | POA: Diagnosis not present

## 2020-01-11 DIAGNOSIS — S72141D Displaced intertrochanteric fracture of right femur, subsequent encounter for closed fracture with routine healing: Secondary | ICD-10-CM | POA: Diagnosis not present

## 2020-01-11 DIAGNOSIS — I480 Paroxysmal atrial fibrillation: Secondary | ICD-10-CM | POA: Diagnosis not present

## 2020-01-22 ENCOUNTER — Telehealth: Payer: Self-pay

## 2020-01-22 NOTE — Telephone Encounter (Signed)
Spoke with patient to inform of disconnected monitor. °

## 2020-01-23 DIAGNOSIS — M546 Pain in thoracic spine: Secondary | ICD-10-CM | POA: Diagnosis not present

## 2020-01-29 LAB — CUP PACEART REMOTE DEVICE CHECK
Date Time Interrogation Session: 20210512035317
Implantable Pulse Generator Implant Date: 20181112

## 2020-02-03 ENCOUNTER — Ambulatory Visit (INDEPENDENT_AMBULATORY_CARE_PROVIDER_SITE_OTHER): Payer: Medicare HMO | Admitting: *Deleted

## 2020-02-03 DIAGNOSIS — I639 Cerebral infarction, unspecified: Secondary | ICD-10-CM

## 2020-02-03 NOTE — Progress Notes (Signed)
Carelink Summary Report / Loop Recorder 

## 2020-02-05 DIAGNOSIS — M546 Pain in thoracic spine: Secondary | ICD-10-CM | POA: Diagnosis not present

## 2020-02-10 DIAGNOSIS — M1712 Unilateral primary osteoarthritis, left knee: Secondary | ICD-10-CM | POA: Diagnosis not present

## 2020-02-10 DIAGNOSIS — R2689 Other abnormalities of gait and mobility: Secondary | ICD-10-CM | POA: Diagnosis not present

## 2020-02-10 DIAGNOSIS — S72141D Displaced intertrochanteric fracture of right femur, subsequent encounter for closed fracture with routine healing: Secondary | ICD-10-CM | POA: Diagnosis not present

## 2020-02-10 DIAGNOSIS — R278 Other lack of coordination: Secondary | ICD-10-CM | POA: Diagnosis not present

## 2020-02-10 DIAGNOSIS — Z8673 Personal history of transient ischemic attack (TIA), and cerebral infarction without residual deficits: Secondary | ICD-10-CM | POA: Diagnosis not present

## 2020-02-10 DIAGNOSIS — I480 Paroxysmal atrial fibrillation: Secondary | ICD-10-CM | POA: Diagnosis not present

## 2020-02-12 ENCOUNTER — Other Ambulatory Visit: Payer: Self-pay

## 2020-02-12 NOTE — Telephone Encounter (Signed)
Received fax from [facility] for rx for Xarelto.

## 2020-02-13 MED ORDER — RIVAROXABAN 20 MG PO TABS: 20.0000 mg | ORAL_TABLET | Freq: Every day | ORAL | 3 refills | Status: DC

## 2020-02-14 DIAGNOSIS — R0781 Pleurodynia: Secondary | ICD-10-CM | POA: Diagnosis not present

## 2020-02-14 DIAGNOSIS — M546 Pain in thoracic spine: Secondary | ICD-10-CM | POA: Diagnosis not present

## 2020-02-18 ENCOUNTER — Other Ambulatory Visit: Payer: Self-pay | Admitting: Orthopedic Surgery

## 2020-02-18 DIAGNOSIS — R0781 Pleurodynia: Secondary | ICD-10-CM

## 2020-03-04 ENCOUNTER — Ambulatory Visit
Admission: RE | Admit: 2020-03-04 | Discharge: 2020-03-04 | Disposition: A | Discharge status: Home / Self Care | Payer: Medicare HMO | Source: Ambulatory Visit | Attending: Orthopedic Surgery | Admitting: Orthopedic Surgery

## 2020-03-04 DIAGNOSIS — S2241XA Multiple fractures of ribs, right side, initial encounter for closed fracture: Secondary | ICD-10-CM | POA: Diagnosis not present

## 2020-03-04 DIAGNOSIS — R0781 Pleurodynia: Secondary | ICD-10-CM

## 2020-03-09 ENCOUNTER — Ambulatory Visit (INDEPENDENT_AMBULATORY_CARE_PROVIDER_SITE_OTHER): Payer: Medicare HMO | Admitting: *Deleted

## 2020-03-09 DIAGNOSIS — I639 Cerebral infarction, unspecified: Secondary | ICD-10-CM | POA: Diagnosis not present

## 2020-03-09 LAB — CUP PACEART REMOTE DEVICE CHECK
Date Time Interrogation Session: 20210621004144
Implantable Pulse Generator Implant Date: 20181112

## 2020-03-09 NOTE — Progress Notes (Signed)
Carelink Summary Report / Loop Recorder 

## 2020-03-12 DIAGNOSIS — R278 Other lack of coordination: Secondary | ICD-10-CM | POA: Diagnosis not present

## 2020-03-12 DIAGNOSIS — R2689 Other abnormalities of gait and mobility: Secondary | ICD-10-CM | POA: Diagnosis not present

## 2020-03-12 DIAGNOSIS — M1712 Unilateral primary osteoarthritis, left knee: Secondary | ICD-10-CM | POA: Diagnosis not present

## 2020-03-12 DIAGNOSIS — S72141D Displaced intertrochanteric fracture of right femur, subsequent encounter for closed fracture with routine healing: Secondary | ICD-10-CM | POA: Diagnosis not present

## 2020-03-12 DIAGNOSIS — I480 Paroxysmal atrial fibrillation: Secondary | ICD-10-CM | POA: Diagnosis not present

## 2020-03-12 DIAGNOSIS — Z8673 Personal history of transient ischemic attack (TIA), and cerebral infarction without residual deficits: Secondary | ICD-10-CM | POA: Diagnosis not present

## 2020-03-13 ENCOUNTER — Other Ambulatory Visit: Payer: Self-pay | Admitting: Internal Medicine

## 2020-03-13 NOTE — Telephone Encounter (Signed)
Last written 10-29-19 #45 and 2 refills Last OV 10-28-19 Next OV 06-18-20 [facility]

## 2020-03-13 NOTE — Telephone Encounter (Signed)
This is the 1st request we have received.

## 2020-03-13 NOTE — Telephone Encounter (Signed)
Patient came by office. Patient said he's been out of his medication for 5 days.

## 2020-03-21 ENCOUNTER — Other Ambulatory Visit: Payer: Self-pay

## 2020-03-21 MED ORDER — MIRTAZAPINE 15 MG PO TABS: ORAL_TABLET | ORAL | 3 refills | Status: DC

## 2020-03-31 NOTE — Progress Notes (Deleted)
Electrophysiology Office Note Date: 03/31/2020  ID:  Jared [name], DOB Dec 31, 1940, MRN [id number]  PCP: [name], MD Primary Cardiologist: No primary care provider on file. Electrophysiologist: [name], MD ***  CC: ILR follow-up  Jared [name] is a 79 y.o. male seen today for {Blank single:19197::"Dr. Allred","Dr. Arlan Organ. Klein","Dr. Camnitz"} . he presents today for routine electrophysiology followup.  Since last being seen in our clinic, the patient reports doing very well.  he denies chest pain, palpitations, dyspnea, PND, orthopnea, nausea, vomiting, dizziness, syncope, edema, weight gain, or early satiety.  Device History: Medtronic loop recorder implanted 07/2017 for Cryptogenic Stroke  Past Medical History:  Diagnosis Date  . Allergic rhinitis   . Anticoagulant long-term use    xarelto  . Anxiety   . Benign localized prostatic hyperplasia with lower urinary tract symptoms (LUTS)   . Benzodiazepine dependence ([place])   . Constipation   . Diverticulosis   . Edema of right lower extremity   . GERD (gastroesophageal reflux disease)   . Heart murmur   . Hematuria   . Hepatitis    History of  . History of colonic polyps   . History of CVA (cerebrovascular accident) without residual deficits 07/26/2017   crytogenic stroke -- acute left caudate body infarct   . History of kidney stones   . Hypertension   . Ischemic cardiomyopathy 07/26/2017   ef 35-40%;    TEE 07-31-2017 ef 60-65%  . Left anterior fascicular block 02/26/2019   Noted on EKG  . LVH (left ventricular hypertrophy) 02/26/2019   Noted on EKG   . MDD (major depressive disorder), recurrent, in partial remission ([place])   . OA (osteoarthritis)    knees,   . OSA (obstructive sleep apnea)    intolerant cpap  . PAF (paroxysmal atrial fibrillation) [facility])    cardiologist-  dr g. taylor  . Skin cancer    Childhood, right foot on toe  . Status post placement of implantable loop recorder 07/31/2017    . Stroke ([place])   . Swelling of right foot 12/2018  . Throat mass   . Wears glasses    Past Surgical History:  Procedure Laterality Date  . APPENDECTOMY  1970s  . CATARACT EXTRACTION W/ INTRAOCULAR LENS  IMPLANT, BILATERAL Bilateral 2014  . CHOLECYSTECTOMY  1970s  . COLONOSCOPY  2014  . CYSTOSCOPY    . FEMUR IM NAIL Right 02/27/2019   Procedure: INTRAMEDULLARY (IM) NAIL FEMORAL;  Surgeon: Rod Can, MD;  Location: [facility];  Service: Orthopedics;  Laterality: Right;  . FOOT SURGERY Left yrs ago  . HERNIA REPAIR  2014  . IR URETERAL STENT LEFT NEW ACCESS W/O SEP NEPHROSTOMY CATH  12/17/2019  . LOOP RECORDER INSERTION N/A 07/31/2017   Procedure: LOOP RECORDER INSERTION;  Surgeon: Evans Lance, MD;  Location: [place] [facility];  Service: Cardiovascular;  Laterality: N/A;  . NEPHROLITHOTOMY Left 12/17/2019   Procedure: NEPHROLITHOTOMY PERCUTANEOUS;  Surgeon: Kathie Rhodes, MD;  [facility];  Service: Urology;  Laterality: Left;  . TEE WITHOUT CARDIOVERSION N/A 07/31/2017   Procedure: TRANSESOPHAGEAL ECHOCARDIOGRAM (TEE) WITH LOOP;  Surgeon: Dorothy Spark, MD;  Location: [place];  Service: Cardiovascular;  Laterality: N/A;  . TOTAL KNEE ARTHROPLASTY Left 02/22/2019   Procedure: TOTAL KNEE ARTHROPLASTY;  Surgeon: [name], MD;  [facility];  Service: Orthopedics;  Laterality: Left;  . WRIST FRACTURE SURGERY  1970s   right x2  unsure if any hardware remain   ;  left x1 with pinning then removed    Current Outpatient Medications  Medication Sig Dispense Refill  . ALPRAZolam (XANAX) 1 MG tablet TAKE 0.5 TABLETS (0.5 MG TOTAL) BY MOUTH 3 (THREE) TIMES DAILY AS NEEDED FOR ANXIETY. 45 tablet 0  . amLODipine (NORVASC) 10 MG tablet Take 1 tablet (10 mg total) by mouth daily. (Patient taking differently: Take 10 mg by mouth every morning. ) 90 tablet 3  . bisacodyl (DULCOLAX) 10 MG suppository Place 1 suppository (10 mg total) rectally daily as needed for moderate  constipation. (Patient not taking: Reported on 12/06/2019) 12 suppository 0  . buPROPion (WELLBUTRIN SR) 150 MG 12 hr tablet Take 1 tablet (150 mg total) by mouth 2 (two) times daily. 180 tablet 3  . carvedilol (COREG) 6.25 MG tablet Take 1 tablet (6.25 mg total) by mouth 2 (two) times daily with a meal. 180 tablet 3  . dutasteride (AVODART) 0.5 MG capsule TAKE 1 CAPSULE BY MOUTH EVERY DAY (Patient taking differently: Take 0.5 mg by mouth every morning. ) 90 capsule 3  . mirtazapine (REMERON) 15 MG tablet TAKE 1 TABLET BY MOUTH EVERYDAY AT BEDTIME 90 tablet 3  . Propylene Glycol (SYSTANE BALANCE) 0.6 % SOLN Place 1 drop into both eyes daily as needed (dry eyes).    . RESTASIS 0.05 % ophthalmic emulsion Place 1 drop 2 (two) times daily into both eyes.  4  . rivaroxaban (XARELTO) 20 MG TABS tablet Take 1 tablet (20 mg total) by mouth daily with supper. 90 tablet 3  . timolol (TIMOPTIC) 0.5 % ophthalmic solution Place 1 drop into both eyes 2 (two) times daily.   0  . valsartan (DIOVAN) 80 MG tablet Take 1 tablet (80 mg total) by mouth daily. 90 tablet 3   No current facility-administered medications for this visit.    Allergies:   Antihistamines, diphenhydramine-type; Aspirin; Codeine; and Pollen extract   Social History: Social History   Socioeconomic History  . Marital status: Widowed    Spouse name: Not on file  . Number of children: 2  . Years of education: Not on file  . Highest education level: Not on file  Occupational History  . Occupation: Nurse, learning disability    Comment: retired  Tobacco Use  . Smoking status: Never Smoker  . Smokeless tobacco: Never Used  Vaping Use  . Vaping Use: Never used  Substance and Sexual Activity  . Alcohol use: No  . Drug use: Never  . Sexual activity: Not on file  Other Topics Concern  . Not on file  Social History Narrative   Divorced twice---first wife died   2 children---daughter local, son in [place]      Has living will   [place] is daughter   Would accept resuscitation   No prolonged tube feeds if cognitively unaware   Social Determinants of Health   Financial Resource Strain:   . Difficulty of Paying Living Expenses:   Food Insecurity:   . Worried About Charity fundraiser in the Last Year:   . Arboriculturist in the Last Year:   Transportation Needs:   . Film/video editor (Medical):   Marland Kitchen Lack of Transportation (Non-Medical):   Physical Activity:   . Days of Exercise per Week:   . Minutes of Exercise per Session:   Stress:   . Feeling of Stress :   Social Connections:   . Frequency of Communication with Friends and Family:   . Frequency of  Social Gatherings with Friends and Family:   . Attends Religious Services:   . Active Member of Clubs or Organizations:   . Attends Archivist Meetings:   Marland Kitchen Marital Status:   Intimate Partner Violence:   . Fear of Current or Ex-Partner:   . Emotionally Abused:   Marland Kitchen Physically Abused:   . Sexually Abused:     Family History: Family History  Problem Relation Age of Onset  . Heart disease Mother   . Heart disease Father   . Colon cancer Maternal Aunt 70     Review of Systems: All other systems reviewed and are otherwise negative except as noted above.  Physical Exam: There were no vitals filed for this visit.   GEN- The patient is well appearing, alert and oriented x 3 today.   HEENT: normocephalic, atraumatic; sclera clear, conjunctiva pink; hearing intact; oropharynx clear; neck supple  Lungs- Clear to ausculation bilaterally, normal work of breathing.  No wheezes, rales, rhonchi Heart- Regular rate and rhythm, no murmurs, rubs or gallops  GI- soft, non-tender, non-distended, bowel sounds present  Extremities- no clubbing, cyanosis, or edema  MS- no significant deformity or atrophy Skin- warm and dry, no rash or lesion; PPM pocket well healed Psych- euthymic mood, full affect Neuro- strength and sensation are intact  PPM  Interrogation- reviewed in detail today,  See PACEART report  EKG:  EKG is not ordered today.  Recent Labs: 12/13/2019: ALT 30; BUN 19; Creatinine, Ser 1.09; Potassium 4.7; Sodium 144 12/17/2019: Hemoglobin 13.6; Platelets 170   Wt Readings from Last 3 Encounters:  12/17/19 202 lb 2.6 oz (91.7 kg)  12/13/19 202 lb 3.2 oz (91.7 kg)  12/11/19 195 lb (88.5 kg)     Other studies Reviewed: Additional studies/ records that were reviewed today include: Echo 07/2017 shows LVEF 60-65%, Previous EP office notes, Previous remote checks, Most recent labwork.   Assessment and Plan:  1. Cryptogenic Stroke s/p Medtronic Loop recorder -> AF identified Normal device function See Pace Art report No changes today Continue Xarelto for CHA2DS2VASC of 5.   2. Sleep apnea Intolerant to CPAP  3. HTN Continue current regimen.   Current medicines are reviewed at length with the patient today.   The patient {ACTIONS; HAS/DOES NOT HAVE:19233} concerns regarding his medicines.  The following changes were made today:  {NONE DEFAULTED:18576::"none"}  Labs/ tests ordered today include: *** No orders of the defined types were placed in this encounter.    Disposition:   Follow up with {Blank single:19197::"Dr. Allred","Dr. Arlan Organ. Klein","Dr. Camnitz","EP APP"} in *** {Blank single:19197::"Months","Weeks"}    Signed, Shirley Friar, PA-C  03/31/2020 3:40 PM  [facility] [address] [place] [address] [phone] (office) [phone] (fax)

## 2020-04-01 ENCOUNTER — Encounter: Payer: Medicare HMO | Admitting: Student

## 2020-04-11 DIAGNOSIS — M1712 Unilateral primary osteoarthritis, left knee: Secondary | ICD-10-CM | POA: Diagnosis not present

## 2020-04-11 DIAGNOSIS — R278 Other lack of coordination: Secondary | ICD-10-CM | POA: Diagnosis not present

## 2020-04-11 DIAGNOSIS — S72141D Displaced intertrochanteric fracture of right femur, subsequent encounter for closed fracture with routine healing: Secondary | ICD-10-CM | POA: Diagnosis not present

## 2020-04-11 DIAGNOSIS — Z8673 Personal history of transient ischemic attack (TIA), and cerebral infarction without residual deficits: Secondary | ICD-10-CM | POA: Diagnosis not present

## 2020-04-11 DIAGNOSIS — R2689 Other abnormalities of gait and mobility: Secondary | ICD-10-CM | POA: Diagnosis not present

## 2020-04-11 DIAGNOSIS — I480 Paroxysmal atrial fibrillation: Secondary | ICD-10-CM | POA: Diagnosis not present

## 2020-04-12 ENCOUNTER — Other Ambulatory Visit: Payer: Self-pay | Admitting: Internal Medicine

## 2020-04-13 ENCOUNTER — Ambulatory Visit (INDEPENDENT_AMBULATORY_CARE_PROVIDER_SITE_OTHER): Payer: Medicare HMO | Admitting: *Deleted

## 2020-04-13 DIAGNOSIS — I480 Paroxysmal atrial fibrillation: Secondary | ICD-10-CM

## 2020-04-13 LAB — CUP PACEART REMOTE DEVICE CHECK
Date Time Interrogation Session: 20210725232839
Implantable Pulse Generator Implant Date: 20181112

## 2020-04-13 NOTE — Telephone Encounter (Signed)
Last written 03-13-20 #45 Last OV 10-28-19 Next OV 06-18-20 [facility]

## 2020-04-14 NOTE — Progress Notes (Signed)
Carelink Summary Report / Loop Recorder 

## 2020-04-26 ENCOUNTER — Other Ambulatory Visit: Payer: Self-pay

## 2020-04-26 ENCOUNTER — Inpatient Hospital Stay (HOSPITAL_COMMUNITY)
Admission: EM | Admit: 2020-04-26 | Discharge: 2020-04-28 | DRG: 349 | Disposition: A | Discharge status: Home / Self Care | Payer: Medicare HMO | Attending: Internal Medicine | Admitting: Internal Medicine

## 2020-04-26 ENCOUNTER — Encounter (HOSPITAL_COMMUNITY): Payer: Self-pay

## 2020-04-26 DIAGNOSIS — E6609 Other obesity due to excess calories: Secondary | ICD-10-CM | POA: Diagnosis present

## 2020-04-26 DIAGNOSIS — K6289 Other specified diseases of anus and rectum: Secondary | ICD-10-CM

## 2020-04-26 DIAGNOSIS — G4733 Obstructive sleep apnea (adult) (pediatric): Secondary | ICD-10-CM | POA: Diagnosis present

## 2020-04-26 DIAGNOSIS — I10 Essential (primary) hypertension: Secondary | ICD-10-CM | POA: Diagnosis present

## 2020-04-26 DIAGNOSIS — M4316 Spondylolisthesis, lumbar region: Secondary | ICD-10-CM | POA: Diagnosis not present

## 2020-04-26 DIAGNOSIS — Z8673 Personal history of transient ischemic attack (TIA), and cerebral infarction without residual deficits: Secondary | ICD-10-CM

## 2020-04-26 DIAGNOSIS — I255 Ischemic cardiomyopathy: Secondary | ICD-10-CM | POA: Diagnosis present

## 2020-04-26 DIAGNOSIS — N400 Enlarged prostate without lower urinary tract symptoms: Secondary | ICD-10-CM | POA: Diagnosis present

## 2020-04-26 DIAGNOSIS — Z6830 Body mass index (BMI) 30.0-30.9, adult: Secondary | ICD-10-CM

## 2020-04-26 DIAGNOSIS — K641 Second degree hemorrhoids: Principal | ICD-10-CM | POA: Diagnosis present

## 2020-04-26 DIAGNOSIS — Z20822 Contact with and (suspected) exposure to covid-19: Secondary | ICD-10-CM | POA: Diagnosis present

## 2020-04-26 DIAGNOSIS — F3341 Major depressive disorder, recurrent, in partial remission: Secondary | ICD-10-CM | POA: Diagnosis present

## 2020-04-26 DIAGNOSIS — K219 Gastro-esophageal reflux disease without esophagitis: Secondary | ICD-10-CM | POA: Diagnosis present

## 2020-04-26 DIAGNOSIS — Z79899 Other long term (current) drug therapy: Secondary | ICD-10-CM

## 2020-04-26 DIAGNOSIS — K5641 Fecal impaction: Secondary | ICD-10-CM | POA: Diagnosis present

## 2020-04-26 DIAGNOSIS — K611 Rectal abscess: Secondary | ICD-10-CM | POA: Diagnosis not present

## 2020-04-26 DIAGNOSIS — Z96652 Presence of left artificial knee joint: Secondary | ICD-10-CM | POA: Diagnosis present

## 2020-04-26 DIAGNOSIS — Z7901 Long term (current) use of anticoagulants: Secondary | ICD-10-CM

## 2020-04-26 DIAGNOSIS — F419 Anxiety disorder, unspecified: Secondary | ICD-10-CM | POA: Diagnosis present

## 2020-04-26 DIAGNOSIS — I480 Paroxysmal atrial fibrillation: Secondary | ICD-10-CM | POA: Diagnosis present

## 2020-04-26 DIAGNOSIS — K5900 Constipation, unspecified: Secondary | ICD-10-CM | POA: Diagnosis not present

## 2020-04-26 DIAGNOSIS — Z66 Do not resuscitate: Secondary | ICD-10-CM | POA: Diagnosis present

## 2020-04-26 DIAGNOSIS — Z7189 Other specified counseling: Secondary | ICD-10-CM

## 2020-04-26 DIAGNOSIS — K610 Anal abscess: Secondary | ICD-10-CM | POA: Diagnosis not present

## 2020-04-26 DIAGNOSIS — K648 Other hemorrhoids: Secondary | ICD-10-CM | POA: Diagnosis not present

## 2020-04-26 DIAGNOSIS — I7090 Unspecified atherosclerosis: Secondary | ICD-10-CM | POA: Diagnosis not present

## 2020-04-26 LAB — URINALYSIS, ROUTINE W REFLEX MICROSCOPIC
Bilirubin Urine: NEGATIVE
Glucose, UA: 50 mg/dL — AB
Ketones, ur: NEGATIVE mg/dL
Nitrite: NEGATIVE
Protein, ur: 30 mg/dL — AB
Specific Gravity, Urine: 1.017 (ref 1.005–1.030)
WBC, UA: >50 WBC/hpf — ABNORMAL HIGH (ref 0–5)
pH: 5 (ref 5.0–8.0)

## 2020-04-26 LAB — COMPREHENSIVE METABOLIC PANEL
ALT: 39 U/L (ref 0–44)
AST: 28 U/L (ref 15–41)
Albumin: 3.9 g/dL (ref 3.5–5.0)
Alkaline Phosphatase: 106 U/L (ref 38–126)
Anion gap: 8 (ref 5–15)
BUN: 16 mg/dL (ref 8–23)
CO2: 25 mmol/L (ref 22–32)
Calcium: 9.4 mg/dL (ref 8.9–10.3)
Chloride: 106 mmol/L (ref 98–111)
Creatinine, Ser: 1.09 mg/dL (ref 0.61–1.24)
GFR calc Af Amer: >60 mL/min (ref >60)
GFR calc non Af Amer: >60 mL/min (ref >60)
Glucose, Bld: 110 mg/dL — ABNORMAL HIGH (ref 70–99)
Potassium: 4.3 mmol/L (ref 3.5–5.1)
Sodium: 139 mmol/L (ref 135–145)
Total Bilirubin: 0.8 mg/dL (ref 0.3–1.2)
Total Protein: 7.9 g/dL (ref 6.5–8.1)

## 2020-04-26 LAB — CBC
HCT: 46.7 % (ref 39.0–52.0)
Hemoglobin: 15.3 g/dL (ref 13.0–17.0)
MCH: 29.5 pg (ref 26.0–34.0)
MCHC: 32.8 g/dL (ref 30.0–36.0)
MCV: 90 fL (ref 80.0–100.0)
Platelets: 244 10*3/uL (ref 150–400)
RBC: 5.19 MIL/uL (ref 4.22–5.81)
RDW: 12.5 % (ref 11.5–15.5)
WBC: 9.9 10*3/uL (ref 4.0–10.5)
nRBC: 0 % (ref 0.0–0.2)

## 2020-04-26 MED ORDER — FLEET ENEMA 7-19 GM/118ML RE ENEM
1.0000 | ENEMA | Freq: Once | RECTAL | Status: AC
  Administered 2020-04-27: 1 via RECTAL
  Filled 2020-04-26: qty 1

## 2020-04-26 MED ORDER — SODIUM CHLORIDE 0.9% FLUSH
3.0000 mL | Freq: Once | INTRAVENOUS | Status: AC
  Administered 2020-04-27: 3 mL via INTRAVENOUS

## 2020-04-26 NOTE — ED Provider Notes (Signed)
Patient seen/examined in the Emergency Department in conjunction with Advanced Practice Provider [facility] Patient reports rectal pain and constipation Exam : awake/alert.  Rectal exam with chaperone present - pt with minimal rectal tone but does have sensation.  He has stool impaction. Denies back pain/leg weakness Plan: plan for disimpaction and reassess patient.  He denies abd pain/vomiting    Ripley Fraise, MD 04/26/20 2324

## 2020-04-26 NOTE — ED Triage Notes (Signed)
Pt presents to ED with groin and rectal pain. PT reports he was straining yesterday while pushing lawn mower, and also straining to have BM. PT states he was unable to have BM and remains constipated. Unsure of when last BM was

## 2020-04-26 NOTE — ED Provider Notes (Signed)
[place] EMERGENCY DEPARTMENT Provider Note   CSN: [id number] Arrival date & time: 04/26/20  1746     History Chief Complaint  Patient presents with  . Constipation  . Rectal Pain    Jared [name] is a 79 y.o. male with a past medical history of hypertension, ischemic cardiomyopathy, A. fib anticoagulated with Xarelto who presents today for evaluation of rectal pain.  He reports his last bowel movement was about 3 days ago.  Yesterday he had been outside mowing the yard when the motor stopped and he had to push the mower through tall grass.  After that he went inside and attempted to have a bowel movement.  He states that he felt a pop in his rectum.  When he looked at the area he noted that there was "a lot of extra tissue."  He reports a remote history of hemorrhoids however denies anything like this in the past.  No history of rectal prolapse.  He states that since he felt that pop he has had loose stool leaking out, and he is unable to control or stop it.  He denies any difficulties urinating.  He feels like he is able to fully empty his bladder.  He denies any numbness in his genitals or upper inner thighs.  He denies any pain in his abdomen.  No nausea or vomiting.  He denies any testicular pain or swelling.  HPI     Past Medical History:  Diagnosis Date  . Allergic rhinitis   . Anticoagulant long-term use    xarelto  . Anxiety   . Benign localized prostatic hyperplasia with lower urinary tract symptoms (LUTS)   . Benzodiazepine dependence ([place])   . Constipation   . Diverticulosis   . Edema of right lower extremity   . GERD (gastroesophageal reflux disease)   . Heart murmur   . Hematuria   . Hepatitis    History of  . History of colonic polyps   . History of CVA (cerebrovascular accident) without residual deficits 07/26/2017   crytogenic stroke -- acute left caudate body infarct   . History of kidney stones   . Hypertension   . Ischemic  cardiomyopathy 07/26/2017   ef 35-40%;    TEE 07-31-2017 ef 60-65%  . Left anterior fascicular block 02/26/2019   Noted on EKG  . LVH (left ventricular hypertrophy) 02/26/2019   Noted on EKG   . MDD (major depressive disorder), recurrent, in partial remission ([place])   . OA (osteoarthritis)    knees,   . OSA (obstructive sleep apnea)    intolerant cpap  . PAF (paroxysmal atrial fibrillation) [facility])    cardiologist-  dr g. taylor  . Skin cancer    Childhood, right foot on toe  . Status post placement of implantable loop recorder 07/31/2017  . Stroke ([place])   . Swelling of right foot 12/2018  . Throat mass   . Wears glasses     Patient Active Problem List   Diagnosis Date Noted  . Renal calculus, left 12/17/2019  . Abdominal pain 10/30/2019  . Cellulitis of chest wall 09/30/2019  . LLQ pain 08/19/2019  . Preventative health care 06/17/2019  . Advance directive discussed with patient 06/17/2019  . Fall 02/26/2019  . Status post total knee replacement, left 02/22/2019  . Swelling of right foot 01/04/2019  . Hematuria 10/28/2018  . Osteoarthritis of right knee 06/20/2018  . Atrial fibrillation ([place])   . MDD (major depressive disorder), recurrent, in  partial remission ([place])   . Benzodiazepine dependence ([place])   . Ejection fraction < 50% 11/07/2017  . Hyperlipidemia   . Dizziness 07/27/2017  . Frequent PVCs 07/27/2017  . Diverticulosis of colon without hemorrhage 02/06/2013  . Right inguinal hernia 10/12/2012  . BPH (benign prostatic hyperplasia) 04/08/2009  . OSA (obstructive sleep apnea) 10/02/2008  . Essential hypertension 10/05/2007  . Allergic rhinitis 10/05/2007  . GERD 10/05/2007  . History of colonic polyps 10/05/2007  . NEPHROLITHIASIS, HX OF 10/05/2007    Past Surgical History:  Procedure Laterality Date  . APPENDECTOMY  1970s  . CATARACT EXTRACTION W/ INTRAOCULAR LENS  IMPLANT, BILATERAL Bilateral 2014  . CHOLECYSTECTOMY  1970s  . COLONOSCOPY  2014  .  CYSTOSCOPY    . FEMUR IM NAIL Right 02/27/2019   Procedure: INTRAMEDULLARY (IM) NAIL FEMORAL;  Surgeon: Rod Can, MD;  [facility];  Service: Orthopedics;  Laterality: Right;  . FOOT SURGERY Left yrs ago  . HERNIA REPAIR  2014  . IR URETERAL STENT LEFT NEW ACCESS W/O SEP NEPHROSTOMY CATH  12/17/2019  . LOOP RECORDER INSERTION N/A 07/31/2017   Procedure: LOOP RECORDER INSERTION;  Surgeon: Evans Lance, MD;  Location: [place] [facility];  Service: Cardiovascular;  Laterality: N/A;  . NEPHROLITHOTOMY Left 12/17/2019   Procedure: NEPHROLITHOTOMY PERCUTANEOUS;  Surgeon: Kathie Rhodes, MD;  [facility];  Service: Urology;  Laterality: Left;  . TEE WITHOUT CARDIOVERSION N/A 07/31/2017   Procedure: TRANSESOPHAGEAL ECHOCARDIOGRAM (TEE) WITH LOOP;  Surgeon: Dorothy Spark, MD;  Location: [place];  Service: Cardiovascular;  Laterality: N/A;  . TOTAL KNEE ARTHROPLASTY Left 02/22/2019   Procedure: TOTAL KNEE ARTHROPLASTY;  Surgeon: [name], MD;  [facility];  Service: Orthopedics;  Laterality: Left;  . WRIST FRACTURE SURGERY  1970s   right x2  unsure if any hardware remain   ;    left x1 with pinning then removed       Family History  Problem Relation Age of Onset  . Heart disease Mother   . Heart disease Father   . Colon cancer Maternal Aunt 70    Social History   Tobacco Use  . Smoking status: Never Smoker  . Smokeless tobacco: Never Used  Vaping Use  . Vaping Use: Never used  Substance Use Topics  . Alcohol use: No  . Drug use: Never    Home Medications Prior to Admission medications   Medication Sig Start Date End Date Taking? Authorizing Provider  ALPRAZolam (XANAX) 1 MG tablet TAKE 0.5 TABLETS (0.5 MG TOTAL) BY MOUTH 3 (THREE) TIMES DAILY AS NEEDED FOR ANXIETY. 04/13/20  Yes Viviana Simpler I, MD  amLODipine (NORVASC) 10 MG tablet Take 1 tablet (10 mg total) by mouth daily. 10/29/19  Yes [name], MD  buPROPion (WELLBUTRIN SR) 150 MG 12 hr  tablet Take 1 tablet (150 mg total) by mouth 2 (two) times daily. 11/05/19  Yes [name], MD  carvedilol (COREG) 6.25 MG tablet Take 1 tablet (6.25 mg total) by mouth 2 (two) times daily with a meal. 11/05/19  Yes Viviana Simpler I, MD  dutasteride (AVODART) 0.5 MG capsule TAKE 1 CAPSULE BY MOUTH EVERY DAY Patient taking differently: Take 0.5 mg by mouth daily.  09/03/19  Yes [name], MD  mirtazapine (REMERON) 15 MG tablet TAKE 1 TABLET BY MOUTH EVERYDAY AT BEDTIME Patient taking differently: Take 15 mg by mouth at bedtime.  03/21/20  Yes [name], MD  Propylene Glycol (SYSTANE BALANCE)  0.6 % SOLN Place 1 drop into both eyes daily as needed (dry eyes).   Yes [provider]  RESTASIS 0.05 % ophthalmic emulsion Place 1 drop 2 (two) times daily into both eyes. 08/03/17  Yes [provider]  rivaroxaban (XARELTO) 20 MG TABS tablet Take 1 tablet (20 mg total) by mouth daily with supper. 02/13/20  Yes [name], MD  tamsulosin (FLOMAX) 0.4 MG CAPS capsule Take 0.4 mg by mouth every evening. 03/14/20  Yes [provider]  timolol (TIMOPTIC) 0.5 % ophthalmic solution Place 1 drop into both eyes 2 (two) times daily.  06/29/17  Yes [provider]  valsartan (DIOVAN) 80 MG tablet Take 1 tablet (80 mg total) by mouth daily. 10/29/19  Yes [name], MD  bisacodyl (DULCOLAX) 10 MG suppository Place 1 suppository (10 mg total) rectally daily as needed for moderate constipation. Patient not taking: Reported on 12/06/2019 03/05/19   Arrien, Jimmy Picket, MD    Allergies    Antihistamines, diphenhydramine-type; Aspirin; Codeine; and Pollen extract  Review of Systems   Review of Systems  Constitutional: Negative for chills and fever.  HENT: Negative for congestion.   Eyes: Negative for visual disturbance.  Respiratory: Negative for chest tightness and shortness of breath.   Cardiovascular: Negative for chest pain.  Gastrointestinal:  Positive for constipation. Negative for blood in stool.  Genitourinary: Negative for dysuria, flank pain, frequency, penile swelling, scrotal swelling, testicular pain and urgency.  Musculoskeletal: Negative for back pain and neck pain.  Skin: Negative for color change, rash and wound.  Neurological: Negative for weakness and headaches.  All other systems reviewed and are negative.   Physical Exam Updated Vital Signs BP (!) 166/102   Pulse (!) 40   Temp 98.2 F (36.8 C) (Oral)   Resp 16   Wt 97.7 kg   SpO2 97%   BMI 30.91 kg/m   Physical Exam Vitals and nursing note reviewed. Exam conducted with a chaperone present Jenetta Downer, Therapist, sports).  Constitutional:      General: He is not in acute distress.    Appearance: He is well-developed. He is not diaphoretic.  HENT:     Head: Normocephalic and atraumatic.  Eyes:     General: No scleral icterus.       Right eye: No discharge.        Left eye: No discharge.     Conjunctiva/sclera: Conjunctivae normal.  Cardiovascular:     Rate and Rhythm: Normal rate and regular rhythm.  Pulmonary:     Effort: Pulmonary effort is normal. No respiratory distress.     Breath sounds: No stridor.  Abdominal:     General: There is no distension.  Genitourinary:    Penis: Uncircumcised.      Testes:        Right: Tenderness or swelling not present.        Left: Tenderness or swelling not present.     Comments: Patient is spontaneously stooling.  There is mild tender edema in the midline posteriorly.  Patient has no rectal tone.  DRE shows a firm stool ball with loose stool passing around the stool ball.  No blood visualized On repeat exam he had normal rectal tone.  Musculoskeletal:        General: No deformity.     Cervical back: Normal range of motion.  Skin:    General: Skin is warm and dry.  Neurological:     Mental Status: He is alert.  Motor: No abnormal muscle tone.  Psychiatric:        Behavior: Behavior normal.     ED Results /  Procedures / Treatments   Labs (all labs ordered are listed, but only abnormal results are displayed) Labs Reviewed  COMPREHENSIVE METABOLIC PANEL - Abnormal; Notable for the following components:      Result Value   Glucose, Bld 110 (*)    All other components within normal limits  URINALYSIS, ROUTINE W REFLEX MICROSCOPIC - Abnormal; Notable for the following components:   Glucose, UA 50 (*)    Hgb urine dipstick MODERATE (*)    Protein, ur 30 (*)    Leukocytes,Ua SMALL (*)    WBC, UA >50 (*)    Bacteria, UA RARE (*)    All other components within normal limits  SARS CORONAVIRUS 2 BY RT PCR (HOSPITAL ORDER, [place] [facility])  CBC    EKG None  Radiology No results found.    IMPRESSION: 1. 3.9 x 0.9 x 2.2 cm peripherally enhancing collection extending posteriorly from the distal rectum/anus consistent with a perirectal abscess/fistula. 2. Enlarged prostate gland with diffuse wall thickening of the urinary bladder. Question some element of bladder outlet obstruction. 3. Right hip ORIF. 4. Aortic Atherosclerosis (ICD10-I70.0).   Electronically Signed By: San Morelle M.D. On: 04/27/2020 04:20  Procedures Fecal disimpaction  Date/Time: 04/27/2020 6:17 AM Performed by: Lorin Glass, PA-C Authorized by: Lorin Glass, PA-C  Local anesthesia used: yes Anesthesia: see MAR for details  Anesthesia: Local anesthesia used: yes  Sedation: Patient sedated: no  Comments: Patient had pain during the procedure, was able to tolerate with topical lidocaine jelly and IV narcotics.      (including critical care time)  Medications Ordered in ED Medications  sodium chloride 0.9 % bolus 1,000 mL (has no administration in time range)  sodium chloride flush (NS) 0.9 % injection 3 mL (3 mLs Intravenous Given 04/27/20 0258)  sodium phosphate (FLEET) 7-19 GM/118ML enema 1 enema (1 enema Rectal Given 04/27/20 0101)  lidocaine (XYLOCAINE) 2 %  jelly 1 application (1 application Other Given 04/27/20 0257)  fentaNYL (SUBLIMAZE) injection 50 mcg (50 mcg Intravenous Given 04/27/20 0256)  iohexol (OMNIPAQUE) 300 MG/ML solution 100 mL (100 mLs Intravenous Contrast Given 04/27/20 1610)    ED Course  I have reviewed the triage vital signs and the nursing notes.  Pertinent labs & imaging results that were available during my care of the patient were reviewed by me and considered in my medical decision making (see chart for details).  Clinical Course as of Apr 27 714  Mon Apr 27, 2020  0713 I spoke with Jerene Pitch with surgery, requests medicine admit.     [EH]    Clinical Course User Index [EH] Ollen Gross   MDM Rules/Calculators/A&P                         Patient is a 79 year old man who presents today for evaluation of constipation and rectal pain since last night.  On physical exam initially he has poor rectal tone with stooling.  There is a hard stool ball palpable in the rectal vault.  He is given Fleet enema without significant output.  This is then followed by topical lidocaine jelly in the rectum and digital disimpaction.  After all palpated stool was removed CT pelvis was obtained.  This shows concern for a peripherally enhancing collection along the posterior distal rectum/anus  consistent with a perirectal abscess/fistula.    Labs are obtained and reviewed, CBC and CMP are without significant acute hematologic or electrolyte derangements.  Covid test is ordered.  UA does have over 50 whites with rare bacteria.  Patient is not having urinary symptoms at this time.  General surgery is consulted, I spoke with Hunt Oris who requests medicine admit.   Note: Portions of this report may have been transcribed using voice recognition software. Every effort was made to ensure accuracy; however, inadvertent computerized transcription errors may be present   Final Clinical Impression(s) / ED Diagnoses Final diagnoses:   Perirectal abscess  Fecal impaction in rectum [facility])  Rectal pain    Rx / DC Orders ED Discharge Orders    None       Ollen Gross 04/27/20 0715    Ripley Fraise, MD 04/27/20 [phone]

## 2020-04-27 ENCOUNTER — Encounter (HOSPITAL_COMMUNITY): Payer: Self-pay | Admitting: Internal Medicine

## 2020-04-27 ENCOUNTER — Emergency Department (HOSPITAL_COMMUNITY): Payer: Medicare HMO

## 2020-04-27 DIAGNOSIS — Z6830 Body mass index (BMI) 30.0-30.9, adult: Secondary | ICD-10-CM | POA: Diagnosis not present

## 2020-04-27 DIAGNOSIS — K611 Rectal abscess: Secondary | ICD-10-CM

## 2020-04-27 DIAGNOSIS — K5900 Constipation, unspecified: Secondary | ICD-10-CM | POA: Diagnosis present

## 2020-04-27 DIAGNOSIS — Z79899 Other long term (current) drug therapy: Secondary | ICD-10-CM | POA: Diagnosis not present

## 2020-04-27 DIAGNOSIS — I10 Essential (primary) hypertension: Secondary | ICD-10-CM | POA: Diagnosis present

## 2020-04-27 DIAGNOSIS — E6609 Other obesity due to excess calories: Secondary | ICD-10-CM | POA: Diagnosis present

## 2020-04-27 DIAGNOSIS — Z66 Do not resuscitate: Secondary | ICD-10-CM | POA: Diagnosis present

## 2020-04-27 DIAGNOSIS — K5641 Fecal impaction: Secondary | ICD-10-CM | POA: Diagnosis present

## 2020-04-27 DIAGNOSIS — F419 Anxiety disorder, unspecified: Secondary | ICD-10-CM | POA: Diagnosis present

## 2020-04-27 DIAGNOSIS — N400 Enlarged prostate without lower urinary tract symptoms: Secondary | ICD-10-CM | POA: Diagnosis present

## 2020-04-27 DIAGNOSIS — K641 Second degree hemorrhoids: Secondary | ICD-10-CM | POA: Diagnosis present

## 2020-04-27 DIAGNOSIS — Z7901 Long term (current) use of anticoagulants: Secondary | ICD-10-CM | POA: Diagnosis not present

## 2020-04-27 DIAGNOSIS — I480 Paroxysmal atrial fibrillation: Secondary | ICD-10-CM | POA: Diagnosis present

## 2020-04-27 DIAGNOSIS — Z20822 Contact with and (suspected) exposure to covid-19: Secondary | ICD-10-CM | POA: Diagnosis present

## 2020-04-27 DIAGNOSIS — F3341 Major depressive disorder, recurrent, in partial remission: Secondary | ICD-10-CM | POA: Diagnosis present

## 2020-04-27 DIAGNOSIS — K6289 Other specified diseases of anus and rectum: Secondary | ICD-10-CM | POA: Diagnosis present

## 2020-04-27 DIAGNOSIS — K219 Gastro-esophageal reflux disease without esophagitis: Secondary | ICD-10-CM | POA: Diagnosis present

## 2020-04-27 DIAGNOSIS — G4733 Obstructive sleep apnea (adult) (pediatric): Secondary | ICD-10-CM | POA: Diagnosis present

## 2020-04-27 DIAGNOSIS — Z96652 Presence of left artificial knee joint: Secondary | ICD-10-CM | POA: Diagnosis present

## 2020-04-27 DIAGNOSIS — Z8673 Personal history of transient ischemic attack (TIA), and cerebral infarction without residual deficits: Secondary | ICD-10-CM | POA: Diagnosis not present

## 2020-04-27 DIAGNOSIS — I255 Ischemic cardiomyopathy: Secondary | ICD-10-CM | POA: Diagnosis present

## 2020-04-27 LAB — SARS CORONAVIRUS 2 BY RT PCR (HOSPITAL ORDER, PERFORMED IN ~~LOC~~ HOSPITAL LAB): SARS Coronavirus 2: NEGATIVE

## 2020-04-27 MED ORDER — DOCUSATE SODIUM 100 MG PO CAPS
100.0000 mg | ORAL_CAPSULE | Freq: Two times a day (BID) | ORAL | Status: DC
  Administered 2020-04-27 – 2020-04-28 (×2): 100 mg via ORAL
  Filled 2020-04-27 (×2): qty 1

## 2020-04-27 MED ORDER — IOHEXOL 300 MG/ML  SOLN
100.0000 mL | Freq: Once | INTRAMUSCULAR | Status: AC | PRN
  Administered 2020-04-27: 100 mL via INTRAVENOUS

## 2020-04-27 MED ORDER — ALPRAZOLAM 0.5 MG PO TABS
0.5000 mg | ORAL_TABLET | Freq: Three times a day (TID) | ORAL | Status: DC | PRN
  Administered 2020-04-27: 0.5 mg via ORAL
  Filled 2020-04-27: qty 1

## 2020-04-27 MED ORDER — PIPERACILLIN-TAZOBACTAM 3.375 G IVPB
3.3750 g | Freq: Three times a day (TID) | INTRAVENOUS | Status: DC
  Administered 2020-04-27 – 2020-04-28 (×4): 3.375 g via INTRAVENOUS
  Filled 2020-04-27 (×4): qty 50

## 2020-04-27 MED ORDER — MIRTAZAPINE 15 MG PO TABS
15.0000 mg | ORAL_TABLET | Freq: Every day | ORAL | Status: DC
  Administered 2020-04-27: 15 mg via ORAL
  Filled 2020-04-27 (×2): qty 1

## 2020-04-27 MED ORDER — SODIUM CHLORIDE 0.9 % IV BOLUS
1000.0000 mL | Freq: Once | INTRAVENOUS | Status: AC
  Administered 2020-04-27: 1000 mL via INTRAVENOUS

## 2020-04-27 MED ORDER — ONDANSETRON HCL 4 MG/2ML IJ SOLN: 4.0000 mg | Freq: Four times a day (QID) | INTRAMUSCULAR | Status: DC | PRN

## 2020-04-27 MED ORDER — MORPHINE SULFATE (PF) 2 MG/ML IV SOLN
2.0000 mg | INTRAVENOUS | Status: DC | PRN
  Administered 2020-04-27: 2 mg via INTRAVENOUS
  Filled 2020-04-27: qty 1

## 2020-04-27 MED ORDER — BISACODYL 5 MG PO TBEC: 5.0000 mg | DELAYED_RELEASE_TABLET | Freq: Every day | ORAL | Status: DC | PRN

## 2020-04-27 MED ORDER — PIPERACILLIN-TAZOBACTAM 3.375 G IVPB 30 MIN
3.3750 g | Freq: Once | INTRAVENOUS | Status: AC
  Administered 2020-04-27: 3.375 g via INTRAVENOUS
  Filled 2020-04-27: qty 50

## 2020-04-27 MED ORDER — ZOLPIDEM TARTRATE 5 MG PO TABS
5.0000 mg | ORAL_TABLET | Freq: Every evening | ORAL | Status: DC | PRN
  Administered 2020-04-27: 5 mg via ORAL
  Filled 2020-04-27: qty 1

## 2020-04-27 MED ORDER — ENOXAPARIN SODIUM 40 MG/0.4ML ~~LOC~~ SOLN
40.0000 mg | SUBCUTANEOUS | Status: DC
  Administered 2020-04-27: 40 mg via SUBCUTANEOUS
  Filled 2020-04-27: qty 0.4

## 2020-04-27 MED ORDER — POLYETHYLENE GLYCOL 3350 17 G PO PACK: 17.0000 g | PACK | Freq: Every day | ORAL | Status: DC | PRN

## 2020-04-27 MED ORDER — FENTANYL CITRATE (PF) 100 MCG/2ML IJ SOLN
50.0000 ug | Freq: Once | INTRAMUSCULAR | Status: AC
  Administered 2020-04-27: 50 ug via INTRAVENOUS
  Filled 2020-04-27: qty 2

## 2020-04-27 MED ORDER — DUTASTERIDE 0.5 MG PO CAPS
0.5000 mg | ORAL_CAPSULE | Freq: Every day | ORAL | Status: DC
  Administered 2020-04-27 – 2020-04-28 (×2): 0.5 mg via ORAL
  Filled 2020-04-27 (×2): qty 1

## 2020-04-27 MED ORDER — TIMOLOL MALEATE 0.5 % OP SOLN
1.0000 [drp] | Freq: Two times a day (BID) | OPHTHALMIC | Status: DC
  Administered 2020-04-27 – 2020-04-28 (×2): 1 [drp] via OPHTHALMIC
  Filled 2020-04-27: qty 5

## 2020-04-27 MED ORDER — ONDANSETRON HCL 4 MG PO TABS: 4.0000 mg | ORAL_TABLET | Freq: Four times a day (QID) | ORAL | Status: DC | PRN

## 2020-04-27 MED ORDER — BUPROPION HCL ER (SR) 150 MG PO TB12
150.0000 mg | ORAL_TABLET | Freq: Two times a day (BID) | ORAL | Status: DC
  Administered 2020-04-27 – 2020-04-28 (×3): 150 mg via ORAL
  Filled 2020-04-27 (×4): qty 1

## 2020-04-27 MED ORDER — TAMSULOSIN HCL 0.4 MG PO CAPS
0.4000 mg | ORAL_CAPSULE | Freq: Every evening | ORAL | Status: DC
  Administered 2020-04-27: 0.4 mg via ORAL
  Filled 2020-04-27: qty 1

## 2020-04-27 MED ORDER — HYDRALAZINE HCL 20 MG/ML IJ SOLN: 5.0000 mg | INTRAMUSCULAR | Status: DC | PRN

## 2020-04-27 MED ORDER — ACETAMINOPHEN 650 MG RE SUPP: 650.0000 mg | Freq: Four times a day (QID) | RECTAL | Status: DC | PRN

## 2020-04-27 MED ORDER — CYCLOSPORINE 0.05 % OP EMUL
1.0000 [drp] | Freq: Two times a day (BID) | OPHTHALMIC | Status: DC
  Administered 2020-04-27 – 2020-04-28 (×3): 1 [drp] via OPHTHALMIC
  Filled 2020-04-27 (×4): qty 1

## 2020-04-27 MED ORDER — POLYVINYL ALCOHOL 1.4 % OP SOLN
1.0000 [drp] | Freq: Every day | OPHTHALMIC | Status: DC | PRN
  Filled 2020-04-27: qty 15

## 2020-04-27 MED ORDER — IRBESARTAN 150 MG PO TABS
75.0000 mg | ORAL_TABLET | Freq: Every day | ORAL | Status: DC
  Administered 2020-04-27 – 2020-04-28 (×2): 75 mg via ORAL
  Filled 2020-04-27 (×2): qty 1

## 2020-04-27 MED ORDER — ACETAMINOPHEN 325 MG PO TABS
650.0000 mg | ORAL_TABLET | Freq: Four times a day (QID) | ORAL | Status: DC | PRN
  Administered 2020-04-27: 650 mg via ORAL
  Filled 2020-04-27: qty 2

## 2020-04-27 MED ORDER — LIDOCAINE HCL URETHRAL/MUCOSAL 2 % EX GEL
1.0000 "application " | Freq: Once | CUTANEOUS | Status: AC
  Administered 2020-04-27: 1
  Filled 2020-04-27: qty 11

## 2020-04-27 MED ORDER — HYDROCODONE-ACETAMINOPHEN 5-325 MG PO TABS: 1.0000 | ORAL_TABLET | ORAL | Status: DC | PRN

## 2020-04-27 NOTE — ED Notes (Signed)
Patient transported to CT 

## 2020-04-27 NOTE — ED Provider Notes (Signed)
Please see prior provider note for full H&P.  Briefly patient is a 79 year old male who presented with rectal pain, found to have perirectal abscess on CT imaging, general surgery was consulted, plan for hospitalist admission.   07:49: CONSULT: Discussed with hospitliast Dr. Lorin Mercy- accepts admission.    Amaryllis Dyke, PA-C 04/27/20 0750    Ripley Fraise, MD 04/27/20 [phone]

## 2020-04-27 NOTE — Progress Notes (Signed)
Pharmacy Antibiotic Note  Jared [name] is a 79 y.o. male admitted on 04/26/2020 with perirectal abscess.  Pharmacy has been consulted for zosyn dosing. Pt is afebrile and WBC is WNL. Scr is WNL.   Plan: Zosyn 3.375gm IV Q8H (4 hr inf) F/u renal fxn, C&S, clinical status *Pharmacy will sign off as no dose adjustments are anticipated. Thank you for the consult!  Weight: 97.7 kg (215 lb 6.2 oz)  Temp (24hrs), Avg:98.4 F (36.9 C), Min:98.2 F (36.8 C), Max:98.5 F (36.9 C)  Recent Labs  Lab 04/26/20 1851  WBC 9.9  CREATININE 1.09    Estimated Creatinine Clearance: 66.5 mL/min (by C-G formula based on SCr of 1.09 mg/dL).    Allergies  Allergen Reactions  . Antihistamines, Diphenhydramine-Type     Blood in urine   . Aspirin     Blood in urine   . Codeine Nausea And Vomiting    Must take with food  . Pollen Extract Cough    Jared [name], Jared [name] 04/27/2020 8:04 AM

## 2020-04-27 NOTE — H&P (Addendum)
Jared [name] is an 79 y.o. male.   Chief Complaint: rectal pain/constipation HPI: 58 yom who has mmp, on xarelto last took this 8/7 pm, presents after noting some rectal pain.  He has history of constipation.  He was mowing the yard yesterday and then went to have a bm.  Straining and "felt a pop". He was concerned he had extra tissue at the time.  Has had loose stool leakage, no issues urinating he mentions today.  No fevers. He was disimpacted in the er and then had a ct scan that shows enlarged prostate and a 3.9x0.9x2.2 cm enhancing collection extending posteriorly from distal rectum/anus.  He has had no drainage that he is aware of recently.  I was asked to see him  Past Medical History:  Diagnosis Date  . Allergic rhinitis   . Anticoagulant long-term use    xarelto  . Anxiety   . Benign localized prostatic hyperplasia with lower urinary tract symptoms (LUTS)   . Benzodiazepine dependence ([place])   . Constipation   . Diverticulosis   . Edema of right lower extremity   . GERD (gastroesophageal reflux disease)   . Heart murmur   . Hematuria   . Hepatitis    History of  . History of colonic polyps   . History of CVA (cerebrovascular accident) without residual deficits 07/26/2017   crytogenic stroke -- acute left caudate body infarct   . History of kidney stones   . Hypertension   . Ischemic cardiomyopathy 07/26/2017   ef 35-40%;    TEE 07-31-2017 ef 60-65%  . Left anterior fascicular block 02/26/2019   Noted on EKG  . LVH (left ventricular hypertrophy) 02/26/2019   Noted on EKG   . MDD (major depressive disorder), recurrent, in partial remission ([place])   . OA (osteoarthritis)    knees,   . OSA (obstructive sleep apnea)    intolerant cpap  . PAF (paroxysmal atrial fibrillation) [facility])    cardiologist-  dr g. taylor  . Skin cancer    Childhood, right foot on toe  . Status post placement of implantable loop recorder 07/31/2017  . Swelling of right foot 12/2018  . Throat mass   .  Wears glasses     Past Surgical History:  Procedure Laterality Date  . APPENDECTOMY  1970s  . CATARACT EXTRACTION W/ INTRAOCULAR LENS  IMPLANT, BILATERAL Bilateral 2014  . CHOLECYSTECTOMY  1970s  . COLONOSCOPY  2014  . CYSTOSCOPY    . FEMUR IM NAIL Right 02/27/2019   Procedure: INTRAMEDULLARY (IM) NAIL FEMORAL;  Surgeon: Rod Can, MD;  Location: [facility];  Service: Orthopedics;  Laterality: Right;  . FOOT SURGERY Left yrs ago  . HERNIA REPAIR  2014  . IR URETERAL STENT LEFT NEW ACCESS W/O SEP NEPHROSTOMY CATH  12/17/2019  . LOOP RECORDER INSERTION N/A 07/31/2017   Procedure: LOOP RECORDER INSERTION;  Surgeon: Evans Lance, MD;  Location: [place] [facility];  Service: Cardiovascular;  Laterality: N/A;  . NEPHROLITHOTOMY Left 12/17/2019   Procedure: NEPHROLITHOTOMY PERCUTANEOUS;  Surgeon: Kathie Rhodes, MD;  Location: [facility];  Service: Urology;  Laterality: Left;  . TEE WITHOUT CARDIOVERSION N/A 07/31/2017   Procedure: TRANSESOPHAGEAL ECHOCARDIOGRAM (TEE) WITH LOOP;  Surgeon: Dorothy Spark, MD;  Location: [place];  Service: Cardiovascular;  Laterality: N/A;  . TOTAL KNEE ARTHROPLASTY Left 02/22/2019   Procedure: TOTAL KNEE ARTHROPLASTY;  Surgeon: [name], MD;  Location: [facility];  Service: Orthopedics;  Laterality: Left;  . WRIST FRACTURE SURGERY  1970s   right x2  unsure if any hardware remain   ;    left x1 with pinning then removed    Family History  Problem Relation Age of Onset  . Heart disease Mother   . Heart disease Father   . Colon cancer Maternal Aunt 70   Social History:  reports that he has never smoked. He has never used smokeless tobacco. He reports that he does not drink alcohol and does not use drugs.  Allergies:  Allergies  Allergen Reactions  . Antihistamines, Diphenhydramine-Type     Blood in urine   . Aspirin     Blood in urine   . Codeine Nausea And Vomiting    Must take with food  . Pollen Extract Cough    meds reviewed  Results  for orders placed or performed during the hospital encounter of 04/26/20 (from the past 48 hour(s))  Comprehensive metabolic panel     Status: Abnormal   Collection Time: 04/26/20  6:51 PM  Result Value Ref Range   Sodium 139 135 - 145 mmol/L   Potassium 4.3 3.5 - 5.1 mmol/L   Chloride 106 98 - 111 mmol/L   CO2 25 22 - 32 mmol/L   Glucose, Bld 110 (H) 70 - 99 mg/dL    Comment: Glucose reference range applies only to samples taken after fasting for at least 8 hours.   BUN 16 8 - 23 mg/dL   Creatinine, Ser 1.09 0.61 - 1.24 mg/dL   Calcium 9.4 8.9 - 10.3 mg/dL   Total Protein 7.9 6.5 - 8.1 g/dL   Albumin 3.9 3.5 - 5.0 g/dL   AST 28 15 - 41 U/L   ALT 39 0 - 44 U/L   Alkaline Phosphatase 106 38 - 126 U/L   Total Bilirubin 0.8 0.3 - 1.2 mg/dL   GFR calc non Af Amer >60 >60 mL/min   GFR calc Af Amer >60 >60 mL/min   Anion gap 8 5 - 15    Comment: Performed at [facility], [place] [address] [place] [address]  CBC     Status: None   Collection Time: 04/26/20  6:51 PM  Result Value Ref Range   WBC 9.9 4.0 - 10.5 K/uL   RBC 5.19 4.22 - 5.81 MIL/uL   Hemoglobin 15.3 13.0 - 17.0 g/dL   HCT 46.7 39 - 52 %   MCV 90.0 80.0 - 100.0 fL   MCH 29.5 26.0 - 34.0 pg   MCHC 32.8 30.0 - 36.0 g/dL   RDW 12.5 11.5 - 15.5 %   Platelets 244 150 - 400 K/uL   nRBC 0.0 0.0 - 0.2 %    Comment: Performed at [facility], [place] [address] [place], [place] [address]  Urinalysis, Routine w reflex microscopic     Status: Abnormal   Collection Time: 04/26/20 11:39 PM  Result Value Ref Range   Color, Urine YELLOW YELLOW   APPearance CLEAR CLEAR   Specific Gravity, Urine 1.017 1.005 - 1.030   pH 5.0 5.0 - 8.0   Glucose, UA 50 (A) NEGATIVE mg/dL   Hgb urine dipstick MODERATE (A) NEGATIVE   Bilirubin Urine NEGATIVE NEGATIVE   Ketones, ur NEGATIVE NEGATIVE mg/dL   Protein, ur 30 (A) NEGATIVE mg/dL   Nitrite NEGATIVE NEGATIVE   Leukocytes,Ua SMALL (A) NEGATIVE   RBC / HPF 0-5 0 - 5 RBC/hpf    WBC, UA >50 (H) 0 - 5 WBC/hpf   Bacteria,  UA RARE (A) NONE SEEN   Mucus PRESENT     Comment: Performed at [facility], [place] [address] [place], [place] [address]  SARS Coronavirus 2 by RT PCR (hospital order, performed in [facility] hospital lab) Nasopharyngeal Nasopharyngeal Swab     Status: None   Collection Time: 04/27/20  5:54 AM   Specimen: Nasopharyngeal Swab  Result Value Ref Range   SARS Coronavirus 2 NEGATIVE NEGATIVE    Comment: (NOTE) SARS-CoV-2 target nucleic acids are NOT DETECTED.  The SARS-CoV-2 RNA is generally detectable in upper and lower respiratory specimens during the acute phase of infection. The lowest concentration of SARS-CoV-2 viral copies this assay can detect is 250 copies / mL. A negative result does not preclude SARS-CoV-2 infection and should not be used as the sole basis for treatment or other patient management decisions.  A negative result may occur with improper specimen collection / handling, submission of specimen other than nasopharyngeal swab, presence of viral mutation(s) within the areas targeted by this assay, and inadequate number of viral copies (<250 copies / mL). A negative result must be combined with clinical observations, patient history, and epidemiological information.  Fact Sheet for Patients:   StrictlyIdeas.no  Fact Sheet for Healthcare Providers: BankingDealers.co.za  This test is not yet approved or  cleared by the [place] FDA and has been authorized for detection and/or diagnosis of SARS-CoV-2 by FDA under an Emergency Use Authorization (EUA).  This EUA will remain in effect (meaning this test can be used) for the duration of the COVID-19 declaration under Section 564(b)(1) of the Act, 21 U.S.C. section 360bbb-3(b)(1), unless the authorization is terminated or revoked sooner.  Performed at [facility], [place] [address] [place], [place] [address]    No  results found.  Review of Systems  Constitutional: Negative for fever.  Gastrointestinal: Positive for constipation, diarrhea and rectal pain. Negative for abdominal distention, abdominal pain, anal bleeding and blood in stool.  Genitourinary: Negative for difficulty urinating and dysuria.  All other systems reviewed and are negative.   Blood pressure (!) 166/102, pulse (!) 40, temperature 98.2 F (36.8 C), temperature source Oral, resp. rate 16, weight 97.7 kg, SpO2 97 %. Physical Exam Constitutional:      Appearance: Normal appearance.  HENT:     Head: Normocephalic and atraumatic.     Right Ear: External ear normal.     Left Ear: External ear normal.     Nose: Nose normal.     Mouth/Throat:     Mouth: Mucous membranes are moist.     Pharynx: Oropharynx is clear.  Eyes:     General: No scleral icterus.    Extraocular Movements: Extraocular movements intact.     Conjunctiva/sclera: Conjunctivae normal.     Pupils: Pupils are equal, round, and reactive to light.  Neck:     Comments: Soft mobile anterior neck mass (he states has been there since age 3) Cardiovascular:     Rate and Rhythm: Normal rate and regular rhythm.     Pulses: Normal pulses.  Pulmonary:     Effort: Pulmonary effort is normal.     Breath sounds: Normal breath sounds.  Abdominal:     General: Abdomen is flat. Bowel sounds are normal.     Palpations: Abdomen is soft.     Tenderness: There is no abdominal tenderness.  Genitourinary:    Comments: nontender on exam anorectal, no external evidence abscess, no erythema, no drainage, no prolapse Musculoskeletal:  Cervical back: Normal range of motion.     Right lower leg: No edema.     Left lower leg: No edema.  Skin:    General: Skin is warm and dry.     Capillary Refill: Capillary refill takes less than 2 seconds.  Neurological:     General: No focal deficit present.     Mental Status: He is alert and oriented to person, place, and time.   Psychiatric:        Mood and Affect: Mood normal.        Behavior: Behavior normal.      Assessment/Plan ? Perirectal abscess -not sure if this collection accounts for his symptoms, ? Did he have prolapse or issues with hemorrhoids -wbc normal, no fever, not tender at site of ct abnormality- I think reasonable to admit, put on abx and reexamine in am -will make npo after mn -needs to have bowel regimen when goes home due to issues with constipation and requiring disimpaction in the er -hold xarelto -lovenox fine for dvt proph - if doing fine in am can dc home on abx, I dont think will need to go to OR but reasonable to keep him to ensure not worsening Rolm Bookbinder, MD 04/27/2020, 7:58 AM

## 2020-04-27 NOTE — ED Notes (Signed)
Lunch Tray Ordered @ 1033. 

## 2020-04-27 NOTE — H&P (Signed)
History and Physical    Jared [name] ZYS:[id number] DOB: 1941/09/14 DOA: 04/26/2020  PCP: [name], MD Consultants:  Rolena Infante - orthopedics; Campbell - urology; Lovena Le - cardiology Patient coming from:  Home - lives alone; NOK: Daughter, [phone]  Chief Complaint: groin and rectal pain  HPI: Jared [name] is a 79 y.o. male with medical history significant of CVA; aifb on Xarelto; OSA not on CPAP; depression; HTN; and BPH presenting with groin and rectal pain.  He reports that Saturday he was mowing the grass; he felt like he strained to do this.  He went that night to have a BM and felt like he had some rectal prolapse.  For that reason he came to the ER.  He is noticed some back discomfort now.  His rectum feels sore.  No fever.  Last BM prior to Saturday night was maybe Thursday.  He feels sore from the disimpaction.    ED Course:  Perirectal abscess.  Surgery is seeing and requests admission to medicine.  Constipated, disimpacted.  CT with 4 cm abscess.  Review of Systems: As per HPI; otherwise review of systems reviewed and negative.   Ambulatory Status:  Ambulates with a cane  COVID Vaccine Status:   Complete  Past Medical History:  Diagnosis Date  . Allergic rhinitis   . Anticoagulant long-term use    xarelto  . Anxiety   . Benign localized prostatic hyperplasia with lower urinary tract symptoms (LUTS)   . Benzodiazepine dependence ([place])   . Constipation   . Diverticulosis   . Edema of right lower extremity   . GERD (gastroesophageal reflux disease)   . Heart murmur   . Hematuria   . Hepatitis    History of  . History of colonic polyps   . History of CVA (cerebrovascular accident) without residual deficits 07/26/2017   crytogenic stroke -- acute left caudate body infarct   . History of kidney stones   . Hypertension   . Ischemic cardiomyopathy 07/26/2017   ef 35-40%;    TEE 07-31-2017 ef 60-65%  . Left anterior fascicular block 02/26/2019   Noted on EKG  .  LVH (left ventricular hypertrophy) 02/26/2019   Noted on EKG   . MDD (major depressive disorder), recurrent, in partial remission ([place])   . OA (osteoarthritis)    knees,   . OSA (obstructive sleep apnea)    intolerant cpap  . PAF (paroxysmal atrial fibrillation) [facility])    cardiologist-  dr g. taylor  . Skin cancer    Childhood, right foot on toe  . Status post placement of implantable loop recorder 07/31/2017  . Swelling of right foot 12/2018  . Throat mass   . Wears glasses     Past Surgical History:  Procedure Laterality Date  . APPENDECTOMY  1970s  . CATARACT EXTRACTION W/ INTRAOCULAR LENS  IMPLANT, BILATERAL Bilateral 2014  . CHOLECYSTECTOMY  1970s  . COLONOSCOPY  2014  . CYSTOSCOPY    . FEMUR IM NAIL Right 02/27/2019   Procedure: INTRAMEDULLARY (IM) NAIL FEMORAL;  Surgeon: Rod Can, MD;  Location: [facility];  Service: Orthopedics;  Laterality: Right;  . FOOT SURGERY Left yrs ago  . HERNIA REPAIR  2014  . IR URETERAL STENT LEFT NEW ACCESS W/O SEP NEPHROSTOMY CATH  12/17/2019  . LOOP RECORDER INSERTION N/A 07/31/2017   Procedure: LOOP RECORDER INSERTION;  Surgeon: Evans Lance, MD;  Location: [place] CV LAB;  Service: Cardiovascular;  Laterality: N/A;  . NEPHROLITHOTOMY Left  12/17/2019   Procedure: NEPHROLITHOTOMY PERCUTANEOUS;  Surgeon: Kathie Rhodes, MD;  Location: [facility];  Service: Urology;  Laterality: Left;  . TEE WITHOUT CARDIOVERSION N/A 07/31/2017   Procedure: TRANSESOPHAGEAL ECHOCARDIOGRAM (TEE) WITH LOOP;  Surgeon: Dorothy Spark, MD;  Location: [place];  Service: Cardiovascular;  Laterality: N/A;  . TOTAL KNEE ARTHROPLASTY Left 02/22/2019   Procedure: TOTAL KNEE ARTHROPLASTY;  Surgeon: [name], MD;  Location: [facility];  Service: Orthopedics;  Laterality: Left;  . WRIST FRACTURE SURGERY  1970s   right x2  unsure if any hardware remain   ;    left x1 with pinning then removed    Social History   Socioeconomic History  . Marital status: Widowed     Spouse name: Not on file  . Number of children: 2  . Years of education: Not on file  . Highest education level: Not on file  Occupational History  . Occupation: Nurse, learning disability    Comment: retired  Tobacco Use  . Smoking status: Never Smoker  . Smokeless tobacco: Never Used  Vaping Use  . Vaping Use: Never used  Substance and Sexual Activity  . Alcohol use: No  . Drug use: Never  . Sexual activity: Not on file  Other Topics Concern  . Not on file  Social History Narrative   Divorced twice---first wife died   2 children---daughter local, son in [place]      Has living will   [place] is daughter   Would accept resuscitation   No prolonged tube feeds if cognitively unaware   Social Determinants of Health   Financial Resource Strain:   . Difficulty of Paying Living Expenses:   Food Insecurity:   . Worried About Charity fundraiser in the Last Year:   . Arboriculturist in the Last Year:   Transportation Needs:   . Film/video editor (Medical):   Marland Kitchen Lack of Transportation (Non-Medical):   Physical Activity:   . Days of Exercise per Week:   . Minutes of Exercise per Session:   Stress:   . Feeling of Stress :   Social Connections:   . Frequency of Communication with Friends and Family:   . Frequency of Social Gatherings with Friends and Family:   . Attends Religious Services:   . Active Member of Clubs or Organizations:   . Attends Archivist Meetings:   Marland Kitchen Marital Status:   Intimate Partner Violence:   . Fear of Current or Ex-Partner:   . Emotionally Abused:   Marland Kitchen Physically Abused:   . Sexually Abused:     Allergies  Allergen Reactions  . Antihistamines, Diphenhydramine-Type     Blood in urine   . Aspirin     Blood in urine   . Codeine Nausea And Vomiting    Must take with food  . Pollen Extract Cough    Family History  Problem Relation Age of Onset  . Heart disease Mother   . Heart disease Father   . Colon cancer Maternal  Aunt 70    Prior to Admission medications   Medication Sig Start Date End Date Taking? Authorizing Provider  ALPRAZolam (XANAX) 1 MG tablet TAKE 0.5 TABLETS (0.5 MG TOTAL) BY MOUTH 3 (THREE) TIMES DAILY AS NEEDED FOR ANXIETY. 04/13/20  Yes Viviana Simpler I, MD  amLODipine (NORVASC) 10 MG tablet Take 1 tablet (10 mg total) by mouth daily. 10/29/19  Yes [name], MD  buPROPion [facility] SR) 150  MG 12 hr tablet Take 1 tablet (150 mg total) by mouth 2 (two) times daily. 11/05/19  Yes [name], MD  carvedilol (COREG) 6.25 MG tablet Take 1 tablet (6.25 mg total) by mouth 2 (two) times daily with a meal. 11/05/19  Yes Viviana Simpler I, MD  dutasteride (AVODART) 0.5 MG capsule TAKE 1 CAPSULE BY MOUTH EVERY DAY Patient taking differently: Take 0.5 mg by mouth daily.  09/03/19  Yes [name], MD  mirtazapine (REMERON) 15 MG tablet TAKE 1 TABLET BY MOUTH EVERYDAY AT BEDTIME Patient taking differently: Take 15 mg by mouth at bedtime.  03/21/20  Yes [name], MD  Propylene Glycol (SYSTANE BALANCE) 0.6 % SOLN Place 1 drop into both eyes daily as needed (dry eyes).   Yes [provider]  RESTASIS 0.05 % ophthalmic emulsion Place 1 drop 2 (two) times daily into both eyes. 08/03/17  Yes [provider]  rivaroxaban (XARELTO) 20 MG TABS tablet Take 1 tablet (20 mg total) by mouth daily with supper. 02/13/20  Yes [name], MD  tamsulosin (FLOMAX) 0.4 MG CAPS capsule Take 0.4 mg by mouth every evening. 03/14/20  Yes [provider]  timolol (TIMOPTIC) 0.5 % ophthalmic solution Place 1 drop into both eyes 2 (two) times daily.  06/29/17  Yes [provider]  valsartan (DIOVAN) 80 MG tablet Take 1 tablet (80 mg total) by mouth daily. 10/29/19  Yes [name], MD  bisacodyl (DULCOLAX) 10 MG suppository Place 1 suppository (10 mg total) rectally daily as needed for moderate constipation. Patient not taking: Reported on 12/06/2019 03/05/19    Tawni Millers, MD    Physical Exam: Vitals:   04/27/20 0600 04/27/20 0859 04/27/20 0959 04/27/20 1200  BP: (!) 166/102 (!) 160/93 (!) 135/91 (!) 150/87  Pulse: (!) 40 78 80 61  Resp: 16 (!) 30 (!) 21 20  Temp:      TempSrc:      SpO2: 97% 97% 96% 96%  Weight:         . General:  Appears calm and comfortable and is NAD . Eyes:  PERRL, EOMI, normal lids, iris . ENT:  Hard of hearing, grossly normal lips & tongue, mmm . Neck:  no LAD, masses or thyromegaly . Cardiovascular:  RRR, no m/r/g. No LE edema.  Marland Kitchen Respiratory:   CTA bilaterally with no wheezes/rales/rhonchi.  Normal respiratory effort. . Abdomen:  soft, NT, ND, NABS . Skin:  no rash or induration seen on limited exam . Musculoskeletal:  grossly normal tone BUE/BLE, good ROM, no bony abnormality . Psychiatric:  grossly normal mood and affect, speech fluent and appropriate, AOx3 . Neurologic:  CN 2-12 grossly intact, moves all extremities in coordinated fashion    Radiological Exams on Admission: CT PELVIS W CONTRAST  Result Date: 04/27/2020 CLINICAL DATA:  Abscess, anal or rectal. Sudden rectal pain beginning yesterday wall stranding have a bowel movement. EXAM: CT PELVIS WITH CONTRAST TECHNIQUE: Multidetector CT imaging of the pelvis was performed using the standard protocol following the bolus administration of intravenous contrast. CONTRAST:  181mL OMNIPAQUE IOHEXOL 300 MG/ML  SOLN COMPARISON:  CT of the abdomen and pelvis at [facility] 11/11/2019 FINDINGS: Urinary Tract: Distal ureters within normal limits. There is some diffuse wall thickening around the urinary bladder. No mass lesion is present. Question some element of bladder outlet obstruction. Bowel: Moderate stool is present at the rectum. Sigmoid colon is unremarkable. Visualized small bowel and ascending colon is within normal  limits. Vascular/Lymphatic: Atherosclerotic calcifications are present in the distal aorta and branch vessels without  aneurysm. Reproductive: Prostate is enlarged, measuring 5.8 cm in transverse diameter. Other: Peripherally enhancing collection extends posteriorly from the distal rectum/anus measuring 3.9 x 0.9 x 2.2 Cm. Inflammatory changes are present on the left. Musculoskeletal: Degenerative anterolisthesis is present at L4-5. Vertebral body heights are maintained. Bony pelvis is within normal limits. Right hip ORIF is noted with diffuse heterotopic bone formation. IMPRESSION: 1. 3.9 x 0.9 x 2.2 cm peripherally enhancing collection extending posteriorly from the distal rectum/anus consistent with a perirectal abscess/fistula. 2. Enlarged prostate gland with diffuse wall thickening of the urinary bladder. Question some element of bladder outlet obstruction. 3. Right hip ORIF. 4. Aortic Atherosclerosis (ICD10-I70.0). Electronically Signed   By: San Morelle M.D.   On: 04/27/2020 04:20    EKG: not done   Labs on Admission: I have personally reviewed the available labs and imaging studies at the time of the admission.  Pertinent labs:   Glucose 110 Normal CBC UA: 50 glucose, moderate Hgb, small LE, 30 protein, rare bacteria, >50 WBC   Assessment/Plan Principal Problem:   Perirectal abscess Active Problems:   Essential hypertension   BPH (benign prostatic hyperplasia)   Atrial fibrillation (HCC)   MDD (major depressive disorder), recurrent, in partial remission ([place])   Advance directive discussed with patient   Constipation   Class 1 obesity due to excess calories with body mass index (BMI) of 30.0 to 30.9 in adult    Perirectal abscess -Patient presented yesterday evening with groin and rectal pain -Reports what sounds like rectal prolapse yesterday while trying to have a BM -Imaging concerning for large perirectal abscess -He was disimpacted in the ER but continues to have significant pain -Will observe for now on med surg -Continue Zosyn for now -Surgery has seen the patient and prefers  watchful waiting at this time; will recheck in AM to determine if drainage of the abscess is necessary -Morphine as needed for pain - with the understanding that this may slow gut motility and so worsen constipation  Constipation -s/p manual disimpaction -Will need good bowel regimen on an ongoing basis  Afib -Rate controlled with Coreg -Hold Xarelto given possible need for surgery (Lovenox for DVT prophylaxis ok as per surgery)  BPH -Continue Flomax, Avodart  HTN -Continue Norvasc, Coreg, Diovan  DNR -Patient confirmed DNR status at the time of admission  Depression/anxiety -Continue Xanax, Wellbutrin, Remeron  Obesity Body mass index is 30.91 kg/m. -Weight loss should be encouraged -Outpatient PCP/bariatric medicine f/u encouraged    Note: This patient has been tested and is negative for the novel coronavirus COVID-19.  DVT prophylaxis:  Lovenox  Code Status:  DNR - confirmed with patient Family Communication: None present Disposition Plan:  The patient is from: home  Anticipated d/c is to: home without [facility] services   Anticipated d/c date will depend on clinical response to treatment, but possibly as early as tomorrow if he has excellent response to treatment  Patient is currently: acutely ill Consults called:  Surgery Admission status:  Admit - It is my clinical opinion that admission to INPATIENT is reasonable and necessary because of the expectation that this patient will require hospital care that crosses at least 2 midnights to treat this condition based on the medical complexity of the problems presented.  Given the aforementioned information, the predictability of an adverse outcome is felt to be significant.    Karmen Bongo MD [facility]  How to contact the [facility] Attending or Consulting provider [place] or covering provider during after hours [place], for this patient?  1. Check the care team in [facility] and look for a) attending/consulting [facility] provider  listed and b) the [facility] team listed 2. Log into [email or web address] and use [place] universal password to access. If you do not have the password, please contact the hospital operator. 3. Locate the [facility] provider you are looking for under [facility] and page to a number that you can be directly reached. 4. If you still have difficulty reaching the provider, please page the [facility] (Director on Call) for the Hospitalists listed on amion for assistance.   04/27/2020, 2:24 PM

## 2020-04-27 NOTE — ED Notes (Signed)
Dinner ordered 

## 2020-04-28 ENCOUNTER — Encounter (HOSPITAL_COMMUNITY)
Admission: EM | Disposition: A | Discharge status: Home / Self Care | Payer: Self-pay | Source: Home / Self Care | Attending: Internal Medicine

## 2020-04-28 ENCOUNTER — Encounter (HOSPITAL_COMMUNITY): Payer: Self-pay | Admitting: Internal Medicine

## 2020-04-28 ENCOUNTER — Inpatient Hospital Stay (HOSPITAL_COMMUNITY): Payer: Medicare HMO | Admitting: Certified Registered Nurse Anesthetist

## 2020-04-28 HISTORY — PX: INCISION AND DRAINAGE: SHX5863

## 2020-04-28 LAB — BASIC METABOLIC PANEL
Anion gap: 10 (ref 5–15)
BUN: 15 mg/dL (ref 8–23)
CO2: 24 mmol/L (ref 22–32)
Calcium: 8.7 mg/dL — ABNORMAL LOW (ref 8.9–10.3)
Chloride: 106 mmol/L (ref 98–111)
Creatinine, Ser: 0.97 mg/dL (ref 0.61–1.24)
GFR calc Af Amer: >60 mL/min (ref >60)
GFR calc non Af Amer: >60 mL/min (ref >60)
Glucose, Bld: 100 mg/dL — ABNORMAL HIGH (ref 70–99)
Potassium: 3.5 mmol/L (ref 3.5–5.1)
Sodium: 140 mmol/L (ref 135–145)

## 2020-04-28 LAB — CBC
HCT: 40.6 % (ref 39.0–52.0)
Hemoglobin: 13.7 g/dL (ref 13.0–17.0)
MCH: 29.8 pg (ref 26.0–34.0)
MCHC: 33.7 g/dL (ref 30.0–36.0)
MCV: 88.5 fL (ref 80.0–100.0)
Platelets: 202 10*3/uL (ref 150–400)
RBC: 4.59 MIL/uL (ref 4.22–5.81)
RDW: 12.6 % (ref 11.5–15.5)
WBC: 8.5 10*3/uL (ref 4.0–10.5)
nRBC: 0 % (ref 0.0–0.2)

## 2020-04-28 SURGERY — EXAM UNDER ANESTHESIA
Anesthesia: General | Site: Rectum

## 2020-04-28 MED ORDER — DEXAMETHASONE SODIUM PHOSPHATE 10 MG/ML IJ SOLN
INTRAMUSCULAR | Status: AC
  Filled 2020-04-28: qty 1

## 2020-04-28 MED ORDER — BISACODYL 5 MG PO TBEC: 5.0000 mg | DELAYED_RELEASE_TABLET | Freq: Every day | ORAL | 0 refills | Status: DC | PRN

## 2020-04-28 MED ORDER — ROCURONIUM BROMIDE 10 MG/ML (PF) SYRINGE
PREFILLED_SYRINGE | INTRAVENOUS | Status: AC
  Filled 2020-04-28: qty 10

## 2020-04-28 MED ORDER — CHLORHEXIDINE GLUCONATE CLOTH 2 % EX PADS
6.0000 | MEDICATED_PAD | Freq: Once | CUTANEOUS | Status: DC
  Administered 2020-04-28: 6 via TOPICAL

## 2020-04-28 MED ORDER — LIDOCAINE 2% (20 MG/ML) 5 ML SYRINGE
INTRAMUSCULAR | Status: AC
  Filled 2020-04-28: qty 5

## 2020-04-28 MED ORDER — ACETAMINOPHEN 500 MG PO TABS
1000.0000 mg | ORAL_TABLET | ORAL | Status: AC
  Administered 2020-04-28: 1000 mg via ORAL
  Filled 2020-04-28: qty 2

## 2020-04-28 MED ORDER — ONDANSETRON HCL 4 MG/2ML IJ SOLN
INTRAMUSCULAR | Status: DC | PRN
  Administered 2020-04-28: 4 mg via INTRAVENOUS

## 2020-04-28 MED ORDER — FENTANYL CITRATE (PF) 250 MCG/5ML IJ SOLN
INTRAMUSCULAR | Status: AC
  Filled 2020-04-28: qty 5

## 2020-04-28 MED ORDER — DEXAMETHASONE SODIUM PHOSPHATE 10 MG/ML IJ SOLN
INTRAMUSCULAR | Status: DC | PRN
  Administered 2020-04-28: 5 mg via INTRAVENOUS

## 2020-04-28 MED ORDER — EPHEDRINE SULFATE-NACL 50-0.9 MG/10ML-% IV SOSY
PREFILLED_SYRINGE | INTRAVENOUS | Status: DC | PRN
  Administered 2020-04-28: 10 mg via INTRAVENOUS

## 2020-04-28 MED ORDER — LACTATED RINGERS IV SOLN: INTRAVENOUS | Status: DC

## 2020-04-28 MED ORDER — CHLORHEXIDINE GLUCONATE 0.12 % MT SOLN
15.0000 mL | Freq: Once | OROMUCOSAL | Status: AC
  Administered 2020-04-28: 15 mL via OROMUCOSAL
  Filled 2020-04-28: qty 15

## 2020-04-28 MED ORDER — FENTANYL CITRATE (PF) 250 MCG/5ML IJ SOLN
INTRAMUSCULAR | Status: DC | PRN
  Administered 2020-04-28: 100 ug via INTRAVENOUS

## 2020-04-28 MED ORDER — BUPIVACAINE HCL (PF) 0.25 % IJ SOLN
INTRAMUSCULAR | Status: AC
  Filled 2020-04-28: qty 30

## 2020-04-28 MED ORDER — GLYCOPYRROLATE PF 0.2 MG/ML IJ SOSY
PREFILLED_SYRINGE | INTRAMUSCULAR | Status: DC | PRN
Start: 2020-04-28 — End: 2020-04-28
  Administered 2020-04-28: .2 mg via INTRAVENOUS

## 2020-04-28 MED ORDER — LIDOCAINE 2% (20 MG/ML) 5 ML SYRINGE
INTRAMUSCULAR | Status: DC | PRN
  Administered 2020-04-28: 60 mg via INTRAVENOUS

## 2020-04-28 MED ORDER — 0.9 % SODIUM CHLORIDE (POUR BTL) OPTIME
TOPICAL | Status: DC | PRN
  Administered 2020-04-28: 1000 mL

## 2020-04-28 MED ORDER — EPHEDRINE 5 MG/ML INJ
INTRAVENOUS | Status: AC
  Filled 2020-04-28: qty 10

## 2020-04-28 MED ORDER — POLYETHYLENE GLYCOL 3350 17 G PO PACK: 17.0000 g | PACK | Freq: Two times a day (BID) | ORAL | 0 refills | Status: AC

## 2020-04-28 MED ORDER — CALCIUM CARBONATE ANTACID 500 MG PO CHEW
1.0000 | CHEWABLE_TABLET | Freq: Three times a day (TID) | ORAL | Status: DC | PRN
  Administered 2020-04-28: 200 mg via ORAL
  Filled 2020-04-28: qty 1

## 2020-04-28 MED ORDER — ROCURONIUM BROMIDE 10 MG/ML (PF) SYRINGE
PREFILLED_SYRINGE | INTRAVENOUS | Status: DC | PRN
  Administered 2020-04-28: 40 mg via INTRAVENOUS

## 2020-04-28 MED ORDER — SODIUM CHLORIDE 0.9 % IV SOLN: INTRAVENOUS | Status: DC | PRN

## 2020-04-28 MED ORDER — GABAPENTIN 300 MG PO CAPS
300.0000 mg | ORAL_CAPSULE | ORAL | Status: AC
  Administered 2020-04-28: 300 mg via ORAL
  Filled 2020-04-28: qty 1

## 2020-04-28 MED ORDER — ONDANSETRON HCL 4 MG/2ML IJ SOLN
INTRAMUSCULAR | Status: AC
  Filled 2020-04-28: qty 2

## 2020-04-28 MED ORDER — SUGAMMADEX SODIUM 200 MG/2ML IV SOLN
INTRAVENOUS | Status: DC | PRN
  Administered 2020-04-28: 200 mg via INTRAVENOUS

## 2020-04-28 MED ORDER — PROPOFOL 10 MG/ML IV BOLUS
INTRAVENOUS | Status: DC | PRN
  Administered 2020-04-28: 100 mg via INTRAVENOUS

## 2020-04-28 MED ORDER — PROPOFOL 10 MG/ML IV BOLUS
INTRAVENOUS | Status: AC
  Filled 2020-04-28: qty 40

## 2020-04-28 MED ORDER — GLYCOPYRROLATE PF 0.2 MG/ML IJ SOSY
PREFILLED_SYRINGE | INTRAMUSCULAR | Status: AC
  Filled 2020-04-28: qty 1

## 2020-04-28 MED ORDER — POLYETHYLENE GLYCOL 3350 17 G PO PACK
17.0000 g | PACK | Freq: Two times a day (BID) | ORAL | Status: DC
  Administered 2020-04-28: 17 g via ORAL
  Filled 2020-04-28: qty 1

## 2020-04-28 MED ORDER — HYDROCORTISONE ACETATE 25 MG RE SUPP
25.0000 mg | Freq: Two times a day (BID) | RECTAL | 1 refills | Status: DC
Start: 2020-04-30 — End: 2020-04-28

## 2020-04-28 MED ORDER — DOCUSATE SODIUM 100 MG PO CAPS: 100.0000 mg | ORAL_CAPSULE | Freq: Two times a day (BID) | ORAL | 0 refills | Status: AC

## 2020-04-28 MED ORDER — PHENYLEPHRINE HCL-NACL 10-0.9 MG/250ML-% IV SOLN
INTRAVENOUS | Status: DC | PRN
  Administered 2020-04-28: 25 ug/min via INTRAVENOUS

## 2020-04-28 MED ORDER — HYDROCORTISONE ACETATE 25 MG RE SUPP
25.0000 mg | Freq: Two times a day (BID) | RECTAL | 1 refills | Status: AC
Start: 2020-04-30 — End: 2020-05-12

## 2020-04-28 SURGICAL SUPPLY — 31 items
CANISTER SUCT 3000ML PPV (MISCELLANEOUS) ×4 IMPLANT
COVER SURGICAL LIGHT HANDLE (MISCELLANEOUS) ×4 IMPLANT
COVER WAND RF STERILE (DRAPES) ×4 IMPLANT
DECANTER SPIKE VIAL GLASS SM (MISCELLANEOUS) ×4 IMPLANT
DRAPE LAPAROTOMY 100X72 PEDS (DRAPES) ×4 IMPLANT
DRSG PAD ABDOMINAL 8X10 ST (GAUZE/BANDAGES/DRESSINGS) ×4 IMPLANT
ELECT REM PT RETURN 9FT ADLT (ELECTROSURGICAL) ×4
ELECTRODE REM PT RTRN 9FT ADLT (ELECTROSURGICAL) ×2 IMPLANT
GAUZE PACKING IODOFORM 1/2 (PACKING) ×2 IMPLANT
GAUZE SPONGE 4X4 12PLY STRL (GAUZE/BANDAGES/DRESSINGS) ×4 IMPLANT
GLOVE BIO SURGEON STRL SZ7 (GLOVE) ×4 IMPLANT
GLOVE BIOGEL PI IND STRL 7.5 (GLOVE) ×2 IMPLANT
GLOVE BIOGEL PI INDICATOR 7.5 (GLOVE) ×2
GOWN STRL REUS W/ TWL LRG LVL3 (GOWN DISPOSABLE) ×4 IMPLANT
GOWN STRL REUS W/TWL LRG LVL3 (GOWN DISPOSABLE) ×8
KIT BASIN OR (CUSTOM PROCEDURE TRAY) ×4 IMPLANT
KIT SIGMOIDOSCOPE (SET/KITS/TRAYS/PACK) IMPLANT
KIT TURNOVER KIT B (KITS) ×4 IMPLANT
NEEDLE HYPO 25GX1X1/2 BEV (NEEDLE) ×4 IMPLANT
NS IRRIG 1000ML POUR BTL (IV SOLUTION) ×4 IMPLANT
PACK GENERAL/GYN (CUSTOM PROCEDURE TRAY) ×4 IMPLANT
PAD ARMBOARD 7.5X6 YLW CONV (MISCELLANEOUS) ×8 IMPLANT
PENCIL SMOKE EVACUATOR (MISCELLANEOUS) ×4 IMPLANT
SPONGE SURGIFOAM ABS GEL 100 (HEMOSTASIS) IMPLANT
SURGILUBE 2OZ TUBE FLIPTOP (MISCELLANEOUS) ×4 IMPLANT
SUT CHROMIC 2 0 SH (SUTURE) IMPLANT
SUT MON AB 3-0 SH 27 (SUTURE)
SUT MON AB 3-0 SH27 (SUTURE) IMPLANT
SYR CONTROL 10ML LL (SYRINGE) ×4 IMPLANT
TOWEL GREEN STERILE (TOWEL DISPOSABLE) ×4 IMPLANT
TOWEL GREEN STERILE FF (TOWEL DISPOSABLE) ×4 IMPLANT

## 2020-04-28 NOTE — Progress Notes (Signed)
[facility] Progress Note     Subjective: CC-  Continues to have intermittent rectal pain. At times he has no pain, other times he rates the pain as 9/10. He had another BM this morning. WBC 8.5  Objective: Vital signs in last 24 hours: Temp:  [98.2 F (36.8 C)] 98.2 F (36.8 C) (08/09 2105) Pulse Rate:  [61-130] 70 (08/09 2105) Resp:  [16-30] 22 (08/09 1900) BP: (131-164)/(69-93) 156/75 (08/09 2105) SpO2:  [94 %-97 %] 95 % (08/09 2105) Last BM Date: 04/27/20  Intake/Output from previous day: 08/09 0701 - 08/10 0700 In: 1100 [IV Piggyback:1100] Out: 1100 [Urine:1100] Intake/Output this shift: No intake/output data recorded.  PE: Gen:  Alert, NAD, pleasant HEENT: EOM's intact, pupils equal and round Card:  RRR Pulm:  rate and effort normal Abd: Soft, NT/ND, +BS Ext:  no BUE/BLE edema, calves soft and nontender Psych: A&Ox3 GU: no external evidence abscess; no erythema, drainage, or prolapse Skin: no rashes noted, warm and dry  Lab Results:  Recent Labs    04/26/20 1851 04/28/20 0049  WBC 9.9 8.5  HGB 15.3 13.7  HCT 46.7 40.6  PLT 244 202   BMET Recent Labs    04/26/20 1851 04/28/20 0049  NA 139 140  K 4.3 3.5  CL 106 106  CO2 25 24  GLUCOSE 110* 100*  BUN 16 15  CREATININE 1.09 0.97  CALCIUM 9.4 8.7*   PT/INR No results for input(s): LABPROT, INR in the last 72 hours. CMP     Component Value Date/Time   NA 140 04/28/2020 0049   NA 141 08/12/2019 1023   K 3.5 04/28/2020 0049   CL 106 04/28/2020 0049   CO2 24 04/28/2020 0049   GLUCOSE 100 (H) 04/28/2020 0049   BUN 15 04/28/2020 0049   BUN 20 08/12/2019 1023   CREATININE 0.97 04/28/2020 0049   CALCIUM 8.7 (L) 04/28/2020 0049   PROT 7.9 04/26/2020 1851   ALBUMIN 3.9 04/26/2020 1851   AST 28 04/26/2020 1851   ALT 39 04/26/2020 1851   ALKPHOS 106 04/26/2020 1851   BILITOT 0.8 04/26/2020 1851   GFRNONAA >60 04/28/2020 0049   GFRAA >60 04/28/2020 0049   Lipase  No results found  for: LIPASE     Studies/Results: CT PELVIS W CONTRAST  Result Date: 04/27/2020 CLINICAL DATA:  Abscess, anal or rectal. Sudden rectal pain beginning yesterday wall stranding have a bowel movement. EXAM: CT PELVIS WITH CONTRAST TECHNIQUE: Multidetector CT imaging of the pelvis was performed using the standard protocol following the bolus administration of intravenous contrast. CONTRAST:  12mL OMNIPAQUE IOHEXOL 300 MG/ML  SOLN COMPARISON:  CT of the abdomen and pelvis at [facility] 11/11/2019 FINDINGS: Urinary Tract: Distal ureters within normal limits. There is some diffuse wall thickening around the urinary bladder. No mass lesion is present. Question some element of bladder outlet obstruction. Bowel: Moderate stool is present at the rectum. Sigmoid colon is unremarkable. Visualized small bowel and ascending colon is within normal limits. Vascular/Lymphatic: Atherosclerotic calcifications are present in the distal aorta and branch vessels without aneurysm. Reproductive: Prostate is enlarged, measuring 5.8 cm in transverse diameter. Other: Peripherally enhancing collection extends posteriorly from the distal rectum/anus measuring 3.9 x 0.9 x 2.2 Cm. Inflammatory changes are present on the left. Musculoskeletal: Degenerative anterolisthesis is present at L4-5. Vertebral body heights are maintained. Bony pelvis is within normal limits. Right hip ORIF is noted with diffuse heterotopic bone formation. IMPRESSION: 1. 3.9 x 0.9 x 2.2 cm peripherally enhancing  collection extending posteriorly from the distal rectum/anus consistent with a perirectal abscess/fistula. 2. Enlarged prostate gland with diffuse wall thickening of the urinary bladder. Question some element of bladder outlet obstruction. 3. Right hip ORIF. 4. Aortic Atherosclerosis (ICD10-I70.0). Electronically Signed   By: San Morelle M.D.   On: 04/27/2020 04:20    Anti-infectives: Anti-infectives (From admission, onward)   Start      Dose/Rate Route Frequency Ordered Stop   04/27/20 1400  piperacillin-tazobactam (ZOSYN) IVPB 3.375 g     Discontinue     3.375 g 12.5 mL/hr over 240 Minutes Intravenous Every 8 hours 04/27/20 0804     04/27/20 0815  piperacillin-tazobactam (ZOSYN) IVPB 3.375 g        3.375 g 100 mL/hr over 30 Minutes Intravenous  Once 04/27/20 0803 04/27/20 1123       Assessment/Plan A fib on xarelto (last dose 8/7 in PM) - hold xarelto BPH HTN DNR Depression/anxiety Obesity BMI 30.91 Constpation  Rectal pain - unsure if he has a perirectal abscess, or possibly issues with prolapse vs hemorrhoids  ID - zosyn 8/9>> FEN - IVF, NPO VTE - SCDs, lovenox Foley - none Follow up - TBD  Plan - With persistent pain will plan to take to the OR today for EUA. Keep NPO.   LOS: 1 day    Wellington Hampshire, [facility] 04/28/2020, 7:35 AM Please see Amion for pager number during day hours 7:00am-4:30pm

## 2020-04-28 NOTE — Anesthesia Postprocedure Evaluation (Signed)
Anesthesia Post Note  Patient: Jared [name]  Procedure(s) Performed: Jasmine December UNDER ANESTHESIA (N/A Rectum) INCISION AND DRAINAGE (Rectum)     Patient location during evaluation: PACU Anesthesia Type: General Level of consciousness: awake and alert Pain management: pain level controlled Vital Signs Assessment: post-procedure vital signs reviewed and stable Respiratory status: spontaneous breathing, nonlabored ventilation and respiratory function stable Cardiovascular status: blood pressure returned to baseline and stable Postop Assessment: no apparent nausea or vomiting Anesthetic complications: no   No complications documented.  Last Vitals:  Vitals:   04/28/20 1020 04/28/20 1033  BP: 129/79 138/79  Pulse: 74 71  Resp: 16 18  Temp:  36.9 C  SpO2: 95% 95%    Last Pain:  Vitals:   04/28/20 1033  TempSrc:   PainSc: 0-No pain                 Aisley Whan,W. EDMOND

## 2020-04-28 NOTE — Op Note (Signed)
Preoperative diagnosis: perianal pain, possible abscess on CT scan Postoperative diagnosis: internal hemorrhoids, ? Fistula, constipation Procedure: EUA, incision and drainage of posterior left fluid collection, anoscopy Surgeon: Dr Serita Grammes Anes: general EBL: minimal  Complications none Drains none Specimens none Sponge and needle count correct dispo recovery stable.  Indications:  12 yom who has mmp, on xarelto last took this 8/7 pm, presents after noting some rectal pain.  He has history of constipation.  He was mowing the yard yesterday and then went to have a bm.  Straining and "felt a pop". He was concerned he had extra tissue at the time.  Has had loose stool leakage, no issues urinating he mentions today.  No fevers. He was disimpacted in the er and then had a ct scan that shows enlarged prostate and a 3.9x0.9x2.2 cm enhancing collection extending posteriorly from distal rectum/anus.  He was admitted and observed. He was stilll having pain the following morning and we discussed EUA  Procedure: After informed consent obtained patient was taken to the OR. He was already on antibiotics. He had SCDs in place. He was placed under general anesthesia without complication. He was rolled into the prone position and appropriately padded.  He was prepped and draped in standard sterile surgical fashion.  Timeout was performed.  Externally I did not see any evidence infection. There was no real fluctuance.  Anoscopy was performed and there was large amount of stool. He has grade II internal hemorrhoids on this exam.  I was unable to do more due to volume of stool.  I then aspirated where it appeared on the ct scan there was a collection. There was small amt fluid so I made a radial incision there.  There was large cavity there but not much in terms on infection.  I irrigated this and packed with iodoform. I did not see anything else concerning for infection.  Dressings were placed.  He was  extubated and transferred to recovery stable.

## 2020-04-28 NOTE — Discharge Summary (Signed)
Jared [name] UYQ:[id number] DOB: Apr 28, 1941 DOA: 04/26/2020  PCP: [name], MD  Admit date: 04/26/2020  Discharge date: 04/28/2020  Admitted From: Home   disposition: Home   Recommendations for Outpatient Follow-up:   Follow up with general surgery in 2 days on 04/30/2020 as scheduled to have packing removed.  Home Health: None Equipment/Devices: None Consultations: General surgery Discharge Condition: Improved CODE STATUS: DNR Diet Recommendation: Heart Healthy   Diet Order            Diet Carb Modified Fluid consistency: Thin; Room service appropriate? Yes  Diet effective now           Diet - low sodium heart healthy                  Chief Complaint  Patient presents with  . Constipation  . Rectal Pain     Brief history of present illness from the day of admission and additional interim summary    39 yom with PMH significant for atrial fibrillation on Xarelto, HTN, BPH and chronic constipation on xarelto presents after noting some rectal pain. He has history of constipation. He was mowing the yard yesterday and then went to have a bm. Straining and "felt a pop". He was concerned he had extra tissue at the time. Has had loose stool leakage, no issues urinating he mentions today. No fevers. He was disimpacted in the er and then had a ct scan that shows enlarged prostate and a 3.9x0.9x2.2 cm enhancing collection extending posteriorly from distal rectum/anus. He was admitted and observed. He was stilll having pain the following morning and we discussed Zalma   In the ED patient was noted to have constipation was manually disimpacted.  He underwent a CT which showed what appears to be a 4 cm perirectal abscess.  He was  seen by general surgery who recommended admission to hospital service with them as consulting.  He was started on Zosyn.   Patient was seen by general surgery this morning and underwent examination under anesthesia.  At that point he was found to have a large hemorrhoid but no abscess or fistula were noted.  He continued to have a fair amount of stool in the rectum.  He did notice a fluid collection but no abscess, an incision was made and the cavity was packed with iodoform.  Surgery feels patient can go home with follow-up with them as an outpatient in 2 days.  No further antibiotics were warranted.  Patient will need to continue to have a good bowel regimen as he continues to have a fair amount of stool in the rectum even  status post manual disimpaction in ED.  Coreg and Xarelto have been restarted for his atrial fibrillation which can be continued.  Medication changes were made to his HTN and BPH regimen.    Discharge diagnosis     Principal Problem:   Perirectal abscess Active Problems:   Essential hypertension   BPH (benign prostatic hyperplasia)   Atrial fibrillation (HCC)   MDD (major depressive disorder), recurrent, in partial remission ([place])   Advance directive discussed with patient   Constipation   Class 1 obesity due to excess calories with body mass index (BMI) of 30.0 to 30.9 in adult    Discharge instructions    Discharge Instructions    Diet - low sodium heart healthy   Complete by: As directed    Discharge instructions   Complete by: As directed    1. Make sure to take stool softeners to avoid constipation.  2.  You have a followup appointment with G   Discharge wound care:   Complete by: As directed    1. You still have gauze packed in the hemorrhoid. 2. You have an appointment in 2 days on 8/12 to go to the General Surgery clinic to have the packing removed. They will also look at your wound to make sure it is healing well. 3. Make sure to take the stool  softeners to avoid constipation.   Increase activity slowly   Complete by: As directed       Discharge Medications   Allergies as of 04/28/2020      Reactions   Antihistamines, Diphenhydramine-type    Blood in urine    Aspirin    Blood in urine    Codeine Nausea And Vomiting   Must take with food   Pollen Extract Cough      Medication List    TAKE these medications   ALPRAZolam 1 MG tablet Commonly known as: XANAX TAKE 0.5 TABLETS (0.5 MG TOTAL) BY MOUTH 3 (THREE) TIMES DAILY AS NEEDED FOR ANXIETY.   amLODipine 10 MG tablet Commonly known as: NORVASC Take 1 tablet (10 mg total) by mouth daily.   bisacodyl 5 MG EC tablet Commonly known as: DULCOLAX Take 1 tablet (5 mg total) by mouth daily as needed for moderate constipation.   buPROPion 150 MG 12 hr tablet Commonly known as: WELLBUTRIN SR Take 1 tablet (150 mg total) by mouth 2 (two) times daily.   carvedilol 6.25 MG tablet Commonly known as: COREG Take 1 tablet (6.25 mg total) by mouth 2 (two) times daily with a meal.   docusate sodium 100 MG capsule Commonly known as: COLACE Take 1 capsule (100 mg total) by mouth 2 (two) times daily.   dutasteride 0.5 MG capsule Commonly known as: AVODART TAKE 1 CAPSULE BY MOUTH EVERY DAY What changed: how much to take   hydrocortisone 25 MG suppository Commonly known as: ANUSOL-HC Place 1 suppository (25 mg total) rectally every 12 (twelve) hours for 12 days. Start taking on: April 30, 2020   mirtazapine 15 MG tablet Commonly known as: REMERON TAKE 1 TABLET BY MOUTH EVERYDAY AT BEDTIME What changed:   how much to take  how to take this  when to take this  additional instructions   polyethylene glycol 17 g packet Commonly known as: MIRALAX / GLYCOLAX Take 17 g by mouth 2 (two) times daily.   Restasis 0.05 % ophthalmic emulsion Generic drug: cycloSPORINE Place 1 drop 2 (two) times daily into both eyes.   rivaroxaban 20 MG Tabs  tablet Commonly known as:  Xarelto Take 1 tablet (20 mg total) by mouth daily with supper.   Systane Balance 0.6 % Soln Generic drug: Propylene Glycol Place 1 drop into both eyes daily as needed (dry eyes).   tamsulosin 0.4 MG Caps capsule Commonly known as: FLOMAX Take 0.4 mg by mouth every evening.   timolol 0.5 % ophthalmic solution Commonly known as: TIMOPTIC Place 1 drop into both eyes 2 (two) times daily.   valsartan 80 MG tablet Commonly known as: DIOVAN Take 1 tablet (80 mg total) by mouth daily.            Discharge Care Instructions  (From admission, onward)         Start     Ordered   04/28/20 0000  Discharge wound care:       Comments: 1. You still have gauze packed in the hemorrhoid. 2. You have an appointment in 2 days on 8/12 to go to the General Surgery clinic to have the packing removed. They will also look at your wound to make sure it is healing well. 3. Make sure to take the stool softeners to avoid constipation.   04/28/20 1348           Follow-up [facility]. Go on 04/30/2020.   Specialty: General Surgery Why: Your appointment is 8/12 at 10am to have packing removed. Please arrive 30 minutes prior to your appointment to check in and fill out paperwork. Bring photo ID and insurance information. Contact information: [address] [place], [place], PA-C. Go on 05/14/2020.   Specialty: General Surgery Why: Your appointment is 8/26 at 1:30pm Please arrive 15 minutes prior to your appointment to check in  Contact information: [place] [id number] [phone]               Major procedures and Radiology Reports - PLEASE review detailed and final reports thoroughly  -      CT PELVIS W CONTRAST  Result Date: 04/27/2020 CLINICAL DATA:  Abscess, anal or rectal. Sudden rectal pain beginning yesterday wall stranding have a bowel movement.  EXAM: CT PELVIS WITH CONTRAST TECHNIQUE: Multidetector CT imaging of the pelvis was performed using the standard protocol following the bolus administration of intravenous contrast. CONTRAST:  167mL OMNIPAQUE IOHEXOL 300 MG/ML  SOLN COMPARISON:  CT of the abdomen and pelvis at [facility] 11/11/2019 FINDINGS: Urinary Tract: Distal ureters within normal limits. There is some diffuse wall thickening around the urinary bladder. No mass lesion is present. Question some element of bladder outlet obstruction. Bowel: Moderate stool is present at the rectum. Sigmoid colon is unremarkable. Visualized small bowel and ascending colon is within normal limits. Vascular/Lymphatic: Atherosclerotic calcifications are present in the distal aorta and branch vessels without aneurysm. Reproductive: Prostate is enlarged, measuring 5.8 cm in transverse diameter. Other: Peripherally enhancing collection extends posteriorly from the distal rectum/anus measuring 3.9 x 0.9 x 2.2 Cm. Inflammatory changes are present on the left. Musculoskeletal: Degenerative anterolisthesis is present at L4-5. Vertebral body heights are maintained. Bony pelvis is within normal limits. Right hip ORIF is noted with diffuse heterotopic bone formation. IMPRESSION: 1. 3.9 x 0.9 x 2.2 cm peripherally enhancing collection extending posteriorly from the distal rectum/anus consistent with a perirectal abscess/fistula. 2. Enlarged prostate gland with diffuse wall thickening of the urinary bladder. Question some element of bladder outlet obstruction.  3. Right hip ORIF. 4. Aortic Atherosclerosis (ICD10-I70.0). Electronically Signed   By: San Morelle M.D.   On: 04/27/2020 04:20   CUP PACEART REMOTE DEVICE CHECK  Result Date: 04/13/2020 Carelink summary report received. Battery status OK. Normal device function. No new symptom episodes, tachy episodes, brady, or pause episodes.  AF burden is 0.1% of the time, on [facility] according to previous reports.  Monthly  summary reports and ROV/PRN Kathy Breach, RN, CCDS, [employer]   Micro Results    Recent Results (from the past 240 hour(s))  SARS Coronavirus 2 by RT PCR (hospital order, performed in [facility] hospital lab) Nasopharyngeal Nasopharyngeal Swab     Status: None   Collection Time: 04/27/20  5:54 AM   Specimen: Nasopharyngeal Swab  Result Value Ref Range Status   SARS Coronavirus 2 NEGATIVE NEGATIVE Final    Comment: (NOTE) SARS-CoV-2 target nucleic acids are NOT DETECTED.  The SARS-CoV-2 RNA is generally detectable in upper and lower respiratory specimens during the acute phase of infection. The lowest concentration of SARS-CoV-2 viral copies this assay can detect is 250 copies / mL. A negative result does not preclude SARS-CoV-2 infection and should not be used as the sole basis for treatment or other patient management decisions.  A negative result may occur with improper specimen collection / handling, submission of specimen other than nasopharyngeal swab, presence of viral mutation(s) within the areas targeted by this assay, and inadequate number of viral copies (<250 copies / mL). A negative result must be combined with clinical observations, patient history, and epidemiological information.  Fact Sheet for Patients:   StrictlyIdeas.no  Fact Sheet for Healthcare Providers: BankingDealers.co.za  This test is not yet approved or  cleared by the [place] FDA and has been authorized for detection and/or diagnosis of SARS-CoV-2 by FDA under an Emergency Use Authorization (EUA).  This EUA will remain in effect (meaning this test can be used) for the duration of the COVID-19 declaration under Section 564(b)(1) of the Act, 21 U.S.C. section 360bbb-3(b)(1), unless the authorization is terminated or revoked sooner.  Performed at [facility], [place] [address] [place], [place] [address]     Today   Subjective     Tavita Eastham feels much improved since admission.  Feels ready to go home.  Denies chest pain, shortness of breath or abdominal pain.  Feels they can take care of themselves with the resources they have at home.  Objective   Blood pressure (!) 156/93, pulse 69, temperature 98.3 F (36.8 C), resp. rate 16, height 5\' 10"  (1.778 m), weight 97.7 kg, SpO2 95 %.   Intake/Output Summary (Last 24 hours) at 04/28/2020 1632 Last data filed at 04/28/2020 1248 Gross per 24 hour  Intake 1115 ml  Output 1275 ml  Net -160 ml    Exam General: Patient appears well and in good spirits sitting up in bed in no acute distress.  Eyes: sclera anicteric, conjuctiva mild injection bilaterally CVS: S1-S2, regular  Respiratory:  decreased air entry bilaterally secondary to decreased inspiratory effort, rales at bases  GI: NABS, soft, NT  LE: No edema.  Neuro: A/O x 3, Moving all extremities equally with normal strength, CN 3-12 intact, grossly nonfocal.  Psych: patient is logical and coherent, judgement and insight appear normal, mood and affect appropriate to situation.    Data Review   CBC w Diff:  Lab Results  Component Value Date   WBC 8.5 04/28/2020   HGB 13.7 04/28/2020  HGB 13.9 08/12/2019   HCT 40.6 04/28/2020   HCT 41.4 08/12/2019   PLT 202 04/28/2020   PLT 205 08/12/2019   LYMPHOPCT 34 12/17/2019   MONOPCT 13 12/17/2019   EOSPCT 6 12/17/2019   BASOPCT 1 12/17/2019    CMP:  Lab Results  Component Value Date   NA 140 04/28/2020   NA 141 08/12/2019   K 3.5 04/28/2020   CL 106 04/28/2020   CO2 24 04/28/2020   BUN 15 04/28/2020   BUN 20 08/12/2019   CREATININE 0.97 04/28/2020   PROT 7.9 04/26/2020   ALBUMIN 3.9 04/26/2020   BILITOT 0.8 04/26/2020   ALKPHOS 106 04/26/2020   AST 28 04/26/2020   ALT 39 04/26/2020  .   Total Time in preparing paper work, data evaluation and todays exam - 35 minutes  Vashti Hey M.D on 04/28/2020 at [place] PM  [facility]    Office  [phone]

## 2020-04-28 NOTE — Anesthesia Procedure Notes (Signed)
Procedure Name: Intubation Date/Time: 04/28/2020 9:14 AM Performed by: Alain Marion, CRNA Pre-anesthesia Checklist: Patient identified, Emergency Drugs available, Suction available and Patient being monitored Patient Re-evaluated:Patient Re-evaluated prior to induction Oxygen Delivery Method: Circle System Utilized Preoxygenation: Pre-oxygenation with 100% oxygen Induction Type: IV induction Ventilation: Mask ventilation without difficulty Laryngoscope Size: Miller and 2 Grade View: Grade I Tube type: Oral Tube size: 7.5 mm Number of attempts: 1 Airway Equipment and Method: Stylet Placement Confirmation: ETT inserted through vocal cords under direct vision,  positive ETCO2 and breath sounds checked- equal and bilateral Secured at: 22 cm Tube secured with: Tape Dental Injury: Teeth and Oropharynx as per pre-operative assessment

## 2020-04-28 NOTE — Transfer of Care (Signed)
Immediate Anesthesia Transfer of Care Note  Patient: Jared [name]  Procedure(s) Performed: Jasmine December UNDER ANESTHESIA (N/A Rectum) INCISION AND DRAINAGE (Rectum)  Patient Location: PACU  Anesthesia Type:General  Level of Consciousness: awake, alert  and oriented  Airway & Oxygen Therapy: Patient Spontanous Breathing and Patient connected to nasal cannula oxygen  Post-op Assessment: Report given to RN and Post -op Vital signs reviewed and stable  Post vital signs: Reviewed and stable  Last Vitals:  Vitals Value Taken Time  BP 145/93 04/28/20 0950  Temp    Pulse 74 04/28/20 0951  Resp 17 04/28/20 0951  SpO2 99 % 04/28/20 0951  Vitals shown include unvalidated device data.  Last Pain:  Vitals:   04/28/20 0800  TempSrc:   PainSc: 0-No pain         Complications: No complications documented.

## 2020-04-28 NOTE — Discharge Instructions (Addendum)
Follow up in our office 04/30/20 to have packing removed. Do not need to repack after this. Recommend sitz baths 2-3 times daily, and after bowel movements. You need to be on a good bowel regimen to avoid getting constipated (ie Miralax 1-2 times daily, stool softener)   How to Take a Sitz Bath A sitz bath is a warm water bath that may be used to care for your rectum, genital area, or the area between your rectum and genitals (perineum). For a sitz bath, the water only comes up to your hips and covers your buttocks. A sitz bath may done at home in a bathtub or with a portable sitz bath that fits over the toilet. Your health care provider may recommend a sitz bath to help:  Relieve pain and discomfort after delivering a baby.  Relieve pain and itching from hemorrhoids or anal fissures.  Relieve pain after certain surgeries.  Relax muscles that are sore or tight. How to take a sitz bath Take 3-4 sitz baths a day, or as many as told by your health care provider. Bathtub sitz bath To take a sitz bath in a bathtub: 1. Partially fill a bathtub with warm water. The water should be deep enough to cover your hips and buttocks when you are sitting in the tub. 2. If your health care provider told you to put medicine in the water, follow his or her instructions. 3. Sit in the water. 4. Open the tub drain a little, and leave it open during your bath. 5. Turn on the warm water again, enough to replace the water that is draining out. Keep the water running throughout your bath. This helps keep the water at the right level and the right temperature. 6. Soak in the water for 15-20 minutes, or as long as told by your health care provider. 7. When you are done, be careful when you stand up. You may feel dizzy. 8. After the sitz bath, pat yourself dry. Do not rub your skin to dry it.  Over-the-toilet sitz bath To take a sitz bath with an over-the-toilet basin: 1. Follow the manufacturer's  instructions. 2. Fill the basin with warm water. 3. If your health care provider told you to put medicine in the water, follow his or her instructions. 4. Sit on the seat. Make sure the water covers your buttocks and perineum. 5. Soak in the water for 15-20 minutes, or as long as told by your health care provider. 6. After the sitz bath, pat yourself dry. Do not rub your skin to dry it. 7. Clean and dry the basin between uses. 8. Discard the basin if it cracks, or according to the manufacturer's instructions. Contact a health care provider if:  Your symptoms get worse. Do not continue with sitz baths if your symptoms get worse.  You have new symptoms. If this happens, do not continue with sitz baths until you talk with your health care provider. Summary  A sitz bath is a warm water bath in which the water only comes up to your hips and covers your buttocks.  A sitz bath may help relieve itching, relieve pain, and relax muscles that are sore or tight in the lower part of your body, including your genital area.  Take 3-4 sitz baths a day, or as many as told by your health care provider. Soak in the water for 15-20 minutes.  Do not continue with sitz baths if your symptoms get worse. This information is not intended to  replace advice given to you by your health care provider. Make sure you discuss any questions you have with your health care provider. Document Revised: 02/04/2019 Document Reviewed: 09/07/2017 Elsevier Patient Education  [place].   Constipation, Adult Constipation is when a person:  Poops (has a bowel movement) fewer times in a week than normal.  Has a hard time pooping.  Has poop that is dry, hard, or bigger than normal. Follow these instructions at home: Eating and drinking   Eat foods that have a lot of fiber, such as: ? Fresh fruits and vegetables. ? Whole grains. ? Beans.  Eat less of foods that are high in fat, low in fiber, or overly  processed, such as: ? [place] fries. ? Hamburgers. ? Cookies. ? Candy. ? Soda.  Drink enough fluid to keep your pee (urine) clear or pale yellow. General instructions  Exercise regularly or as told by your doctor.  Go to the restroom when you feel like you need to poop. Do not hold it in.  Take over-the-counter and prescription medicines only as told by your doctor. These include any fiber supplements.  Do pelvic floor retraining exercises, such as: ? Doing deep breathing while relaxing your lower belly (abdomen). ? Relaxing your pelvic floor while pooping.  Watch your condition for any changes.  Keep all follow-up visits as told by your doctor. This is important. Contact a doctor if:  You have pain that gets worse.  You have a fever.  You have not pooped for 4 days.  You throw up (vomit).  You are not hungry.  You lose weight.  You are bleeding from the anus.  You have thin, pencil-like poop (stool). Get help right away if:  You have a fever, and your symptoms suddenly get worse.  You leak poop or have blood in your poop.  Your belly feels hard or bigger than normal (is bloated).  You have very bad belly pain.  You feel dizzy or you faint. This information is not intended to replace advice given to you by your health care provider. Make sure you discuss any questions you have with your health care provider. Document Revised: 08/18/2017 Document Reviewed: 02/24/2016 Elsevier Patient Education  2020 [employer].   Hemorrhoids Hemorrhoids are swollen veins in and around the rectum or anus. There are two types of hemorrhoids:  Internal hemorrhoids. These occur in the veins that are just inside the rectum. They may poke through to the outside and become irritated and painful.  External hemorrhoids. These occur in the veins that are outside the anus and can be felt as a painful swelling or hard lump near the anus. Most hemorrhoids do not cause serious  problems, and they can be managed with home treatments such as diet and lifestyle changes. If home treatments do not help the symptoms, procedures can be done to shrink or remove the hemorrhoids. What are the causes? This condition is caused by increased pressure in the anal area. This pressure may result from various things, including:  Constipation.  Straining to have a bowel movement.  Diarrhea.  Pregnancy.  Obesity.  Sitting for long periods of time.  Heavy lifting or other activity that causes you to strain.  Anal sex.  Riding a bike for a long period of time. What are the signs or symptoms? Symptoms of this condition include:  Pain.  Anal itching or irritation.  Rectal bleeding.  Leakage of stool (feces).  Anal swelling.  One or more lumps  around the anus. How is this diagnosed? This condition can often be diagnosed through a visual exam. Other exams or tests may also be done, such as:  An exam that involves feeling the rectal area with a gloved hand (digital rectal exam).  An exam of the anal canal that is done using a small tube (anoscope).  A blood test, if you have lost a significant amount of blood.  A test to look inside the colon using a flexible tube with a camera on the end (sigmoidoscopy or colonoscopy). How is this treated? This condition can usually be treated at home. However, various procedures may be done if dietary changes, lifestyle changes, and other home treatments do not help your symptoms. These procedures can help make the hemorrhoids smaller or remove them completely. Some of these procedures involve surgery, and others do not. Common procedures include:  Rubber band ligation. Rubber bands are placed at the base of the hemorrhoids to cut off their blood supply.  Sclerotherapy. Medicine is injected into the hemorrhoids to shrink them.  Infrared coagulation. A type of light energy is used to get rid of the hemorrhoids.  Hemorrhoidectomy  surgery. The hemorrhoids are surgically removed, and the veins that supply them are tied off.  Stapled hemorrhoidopexy surgery. The surgeon staples the base of the hemorrhoid to the rectal wall. Follow these instructions at home: Eating and drinking   Eat foods that have a lot of fiber in them, such as whole grains, beans, nuts, fruits, and vegetables.  Ask your health care provider about taking products that have added fiber (fiber supplements).  Reduce the amount of fat in your diet. You can do this by eating low-fat dairy products, eating less red meat, and avoiding processed foods.  Drink enough fluid to keep your urine pale yellow. Managing pain and swelling   Take warm sitz baths for 20 minutes, 3-4 times a day to ease pain and discomfort. You may do this in a bathtub or using a portable sitz bath that fits over the toilet.  If directed, apply ice to the affected area. Using ice packs between sitz baths may be helpful. ? Put ice in a plastic bag. ? Place a towel between your skin and the bag. ? Leave the ice on for 20 minutes, 2-3 times a day. General instructions  Take over-the-counter and prescription medicines only as told by your health care provider.  Use medicated creams or suppositories as told.  Get regular exercise. Ask your health care provider how much and what kind of exercise is best for you. In general, you should do moderate exercise for at least 30 minutes on most days of the week (150 minutes each week). This can include activities such as walking, biking, or yoga.  Go to the bathroom when you have the urge to have a bowel movement. Do not wait.  Avoid straining to have bowel movements.  Keep the anal area dry and clean. Use wet toilet paper or moist towelettes after a bowel movement.  Do not sit on the toilet for long periods of time. This increases blood pooling and pain.  Keep all follow-up visits as told by your health care provider. This is  important. Contact a health care provider if you have:  Increasing pain and swelling that are not controlled by treatment or medicine.  Difficulty having a bowel movement, or you are unable to have a bowel movement.  Pain or inflammation outside the area of the hemorrhoids. Get help right  away if you have:  Uncontrolled bleeding from your rectum. Summary  Hemorrhoids are swollen veins in and around the rectum or anus.  Most hemorrhoids can be managed with home treatments such as diet and lifestyle changes.  Taking warm sitz baths can help ease pain and discomfort.  In severe cases, procedures or surgery can be done to shrink or remove the hemorrhoids. This information is not intended to replace advice given to you by your health care provider. Make sure you discuss any questions you have with your health care provider. Document Revised: 02/01/2019 Document Reviewed: 01/25/2018 Elsevier Patient Education  [place].

## 2020-04-28 NOTE — Anesthesia Preprocedure Evaluation (Addendum)
Anesthesia Evaluation  Patient identified by MRN, date of birth, ID band Patient awake    Reviewed: Allergy & Precautions, H&P , NPO status , Patient's Chart, lab work & pertinent test results, reviewed documented beta blocker date and time   Airway Mallampati: II  TM Distance: >3 FB Neck ROM: Full    Dental no notable dental hx. (+) Teeth Intact, Dental Advisory Given   Pulmonary sleep apnea ,    Pulmonary exam normal breath sounds clear to auscultation       Cardiovascular hypertension, Pt. on medications and Pt. on home beta blockers + dysrhythmias Atrial Fibrillation  Rhythm:Regular Rate:Normal     Neuro/Psych Anxiety Depression negative neurological ROS     GI/Hepatic Neg liver ROS, GERD  ,  Endo/Other  negative endocrine ROS  Renal/GU negative Renal ROS  negative genitourinary   Musculoskeletal  (+) Arthritis , Osteoarthritis,    Abdominal   Peds  Hematology negative hematology ROS (+)   Anesthesia Other Findings   Reproductive/Obstetrics negative OB ROS                            Anesthesia Physical Anesthesia Plan  ASA: III  Anesthesia Plan: General   Post-op Pain Management:    Induction: Intravenous  PONV Risk Score and Plan: 3 and Ondansetron, Dexamethasone and Treatment may vary due to age or medical condition  Airway Management Planned: LMA and Oral ETT  Additional Equipment:   Intra-op Plan:   Post-operative Plan: Extubation in OR  Informed Consent: I have reviewed the patients History and Physical, chart, labs and discussed the procedure including the risks, benefits and alternatives for the proposed anesthesia with the patient or authorized representative who has indicated his/her understanding and acceptance.   Patient has DNR.  Discussed DNR with patient and Continue DNR.   Dental advisory given  Plan Discussed with: CRNA  Anesthesia Plan Comments:         Anesthesia Quick Evaluation

## 2020-04-29 ENCOUNTER — Encounter (HOSPITAL_COMMUNITY): Payer: Self-pay | Admitting: General Surgery

## 2020-05-04 ENCOUNTER — Telehealth: Payer: Self-pay

## 2020-05-04 NOTE — Telephone Encounter (Signed)
Spoke to pt to see how he was doing after recent surgery. He is doing well and has an appt here 06-18-20.

## 2020-05-06 ENCOUNTER — Other Ambulatory Visit: Payer: Self-pay | Admitting: Internal Medicine

## 2020-05-06 NOTE — Telephone Encounter (Signed)
Pt needs refill on xarelto and alprazolam sent to [facility]. He said he has received two letters that his prescriptions has not been filled. I let him know that this pharmacy was not showing as a preferred pharmacy but will send a message to get those prescriptions sent to [facility].

## 2020-05-07 ENCOUNTER — Other Ambulatory Visit: Payer: Self-pay | Admitting: Internal Medicine

## 2020-05-07 MED ORDER — ALPRAZOLAM 1 MG PO TABS: 0.5000 mg | ORAL_TABLET | Freq: Three times a day (TID) | ORAL | 0 refills | Status: DC | PRN

## 2020-05-07 MED ORDER — RIVAROXABAN 20 MG PO TABS: 20.0000 mg | ORAL_TABLET | Freq: Every day | ORAL | 3 refills | Status: DC

## 2020-05-07 NOTE — Telephone Encounter (Signed)
Xarelto 20mg  refill request received. Pt is 79 years old, weight-97.7kg, Crea-0.97 on 04/28/2020, last seen by Oda Kilts on 08/12/2019, Diagnosis-Afib, CrCl-88.54ml/min; Dose is appropriate based on dosing criteria. Will send in refill to requested pharmacy.

## 2020-05-11 ENCOUNTER — Other Ambulatory Visit: Payer: Self-pay | Admitting: Internal Medicine

## 2020-05-11 NOTE — Telephone Encounter (Signed)
Last written 04-13-20 #45 Last OV 10-28-19 Next OV 06-18-20 [facility]

## 2020-05-12 DIAGNOSIS — R2689 Other abnormalities of gait and mobility: Secondary | ICD-10-CM | POA: Diagnosis not present

## 2020-05-12 DIAGNOSIS — S72141D Displaced intertrochanteric fracture of right femur, subsequent encounter for closed fracture with routine healing: Secondary | ICD-10-CM | POA: Diagnosis not present

## 2020-05-12 DIAGNOSIS — I480 Paroxysmal atrial fibrillation: Secondary | ICD-10-CM | POA: Diagnosis not present

## 2020-05-12 DIAGNOSIS — M1712 Unilateral primary osteoarthritis, left knee: Secondary | ICD-10-CM | POA: Diagnosis not present

## 2020-05-12 DIAGNOSIS — Z8673 Personal history of transient ischemic attack (TIA), and cerebral infarction without residual deficits: Secondary | ICD-10-CM | POA: Diagnosis not present

## 2020-05-12 DIAGNOSIS — R278 Other lack of coordination: Secondary | ICD-10-CM | POA: Diagnosis not present

## 2020-05-17 LAB — CUP PACEART REMOTE DEVICE CHECK
Date Time Interrogation Session: 20210827233832
Implantable Pulse Generator Implant Date: 20181112

## 2020-05-18 ENCOUNTER — Ambulatory Visit (INDEPENDENT_AMBULATORY_CARE_PROVIDER_SITE_OTHER): Payer: Medicare HMO | Admitting: *Deleted

## 2020-05-18 DIAGNOSIS — I639 Cerebral infarction, unspecified: Secondary | ICD-10-CM | POA: Diagnosis not present

## 2020-05-19 NOTE — Progress Notes (Signed)
Carelink Summary Report / Loop Recorder 

## 2020-06-12 DIAGNOSIS — Z8673 Personal history of transient ischemic attack (TIA), and cerebral infarction without residual deficits: Secondary | ICD-10-CM | POA: Diagnosis not present

## 2020-06-12 DIAGNOSIS — M1712 Unilateral primary osteoarthritis, left knee: Secondary | ICD-10-CM | POA: Diagnosis not present

## 2020-06-12 DIAGNOSIS — R2689 Other abnormalities of gait and mobility: Secondary | ICD-10-CM | POA: Diagnosis not present

## 2020-06-12 DIAGNOSIS — R278 Other lack of coordination: Secondary | ICD-10-CM | POA: Diagnosis not present

## 2020-06-12 DIAGNOSIS — S72141D Displaced intertrochanteric fracture of right femur, subsequent encounter for closed fracture with routine healing: Secondary | ICD-10-CM | POA: Diagnosis not present

## 2020-06-12 DIAGNOSIS — I480 Paroxysmal atrial fibrillation: Secondary | ICD-10-CM | POA: Diagnosis not present

## 2020-06-18 ENCOUNTER — Other Ambulatory Visit: Payer: Self-pay

## 2020-06-18 ENCOUNTER — Encounter: Payer: Self-pay | Admitting: Internal Medicine

## 2020-06-18 ENCOUNTER — Ambulatory Visit (INDEPENDENT_AMBULATORY_CARE_PROVIDER_SITE_OTHER): Payer: Medicare HMO | Admitting: Internal Medicine

## 2020-06-18 VITALS — BP 130/80 | HR 44 | Temp 97.8°F | Ht 71.75 in | Wt 198.0 lb

## 2020-06-18 DIAGNOSIS — F3341 Major depressive disorder, recurrent, in partial remission: Secondary | ICD-10-CM

## 2020-06-18 DIAGNOSIS — I10 Essential (primary) hypertension: Secondary | ICD-10-CM

## 2020-06-18 DIAGNOSIS — I4891 Unspecified atrial fibrillation: Secondary | ICD-10-CM | POA: Diagnosis not present

## 2020-06-18 DIAGNOSIS — I700 Atherosclerosis of aorta: Secondary | ICD-10-CM | POA: Diagnosis not present

## 2020-06-18 DIAGNOSIS — Z0000 Encounter for general adult medical examination without abnormal findings: Secondary | ICD-10-CM

## 2020-06-18 DIAGNOSIS — N401 Enlarged prostate with lower urinary tract symptoms: Secondary | ICD-10-CM

## 2020-06-18 DIAGNOSIS — Z7189 Other specified counseling: Secondary | ICD-10-CM

## 2020-06-18 DIAGNOSIS — K2100 Gastro-esophageal reflux disease with esophagitis, without bleeding: Secondary | ICD-10-CM | POA: Diagnosis not present

## 2020-06-18 LAB — CUP PACEART REMOTE DEVICE CHECK
Date Time Interrogation Session: 20210929233653
Implantable Pulse Generator Implant Date: 20181112

## 2020-06-18 MED ORDER — OMEPRAZOLE 20 MG PO CPDR: 20.0000 mg | DELAYED_RELEASE_CAPSULE | Freq: Every day | ORAL | 3 refills | Status: DC

## 2020-06-18 NOTE — Assessment & Plan Note (Signed)
Now with dysphagia Will start omeprazole

## 2020-06-18 NOTE — Assessment & Plan Note (Signed)
See social history 

## 2020-06-18 NOTE — Assessment & Plan Note (Signed)
BP Readings from Last 3 Encounters:  06/18/20 130/80  04/28/20 (!) 156/93  12/18/19 (!) 173/98   Okay on carvedilol and amlodipine

## 2020-06-18 NOTE — Assessment & Plan Note (Signed)
I have personally reviewed the Medicare Annual Wellness questionnaire and have noted 1. The patient's medical and social history 2. Their use of alcohol, tobacco or illicit drugs 3. Their current medications and supplements 4. The patient's functional ability including ADL's, fall risks, home safety risks and hearing or visual             impairment. 5. Diet and physical activities 6. Evidence for depression or mood disorders  The patients weight, height, BMI and visual acuity have been recorded in the chart I have made referrals, counseling and provided education to the patient based review of the above and I have provided the pt with a written personalized care plan for preventive services.  I have provided you with a copy of your personalized plan for preventive services. Please take the time to review along with your updated medication list.  [facility] Will get COVID booster No cancer screening due to age Needs to pick back up with exercise

## 2020-06-18 NOTE — Assessment & Plan Note (Signed)
Doing okay on dual therapy---tamsulosin and dutasteride

## 2020-06-18 NOTE — Assessment & Plan Note (Signed)
Found on CT scan Will hold off on statin

## 2020-06-18 NOTE — Assessment & Plan Note (Signed)
Unclear if in a fib now Rate is fine on carvedilol On xarelto

## 2020-06-18 NOTE — Progress Notes (Signed)
Subjective:    Patient ID: Jared [name], male    DOB: Jun 18, 1941, 79 y.o.   MRN: [id number]  HPI Here for Medicare wellness visit and follow up of chronic health conditions This visit occurred during the SARS-CoV-2 public health emergency.  Safety protocols were in place, including screening questions prior to the visit, additional usage of staff PPE, and extensive cleaning of exam room while observing appropriate contact time as indicated for disinfecting solutions.   Reviewed form and advanced directives  Reviewed other doctors No alcohol or tobacco Now wears MedicAlert bracelet---since fall recently. Did have mild injury Does regular exercise---not as much. Discussed increasing Vision is okay---overdue for exam (just glasses for reading) Hearing is not great--trouble in crowd Independent with instrumental ADLs---care with vacuuming Memory is not great lately---forgets to put food away, etc  Had perirectal abscess---drained in hospital Found on CT Also noted was aortic atherosclerosis  Had procedure for kidney stones also This is also better  Some trouble swallowing Can feel food when he swallows Gets heartburn after eating---only taking tums  No chest pain No palpitations No dizziness or syncope No edema now  Mood is not great now No social interaction other than church----and this is also limited Regular with xanax 1/2 tid and bupropion. Also mirtazapine  Voids okay  Nocturia is uncommon lately Continues on dual therapy  Current Outpatient Medications on File Prior to Visit  Medication Sig Dispense Refill  . ALPRAZolam (XANAX) 1 MG tablet TAKE 0.5 TABLETS (0.5 MG TOTAL) BY MOUTH 3 (THREE) TIMES DAILY AS NEEDED FOR ANXIETY. 45 tablet 0  . amLODipine (NORVASC) 10 MG tablet Take 1 tablet (10 mg total) by mouth daily. 90 tablet 3  . buPROPion (WELLBUTRIN SR) 150 MG 12 hr tablet Take 1 tablet (150 mg total) by mouth 2 (two) times daily. 180 tablet 3  . carvedilol  (COREG) 6.25 MG tablet Take 1 tablet (6.25 mg total) by mouth 2 (two) times daily with a meal. 180 tablet 3  . docusate sodium (COLACE) 100 MG capsule Take 1 capsule (100 mg total) by mouth 2 (two) times daily. 10 capsule 0  . mirtazapine (REMERON) 15 MG tablet TAKE 1 TABLET BY MOUTH EVERYDAY AT BEDTIME (Patient taking differently: Take 15 mg by mouth at bedtime. ) 90 tablet 3  . polyethylene glycol (MIRALAX / GLYCOLAX) 17 g packet Take 17 g by mouth 2 (two) times daily. 14 each 0  . Propylene Glycol (SYSTANE BALANCE) 0.6 % SOLN Place 1 drop into both eyes daily as needed (dry eyes).    . RESTASIS 0.05 % ophthalmic emulsion Place 1 drop 2 (two) times daily into both eyes.  4  . rivaroxaban (XARELTO) 20 MG TABS tablet Take 1 tablet (20 mg total) by mouth daily with supper. 90 tablet 3  . tamsulosin (FLOMAX) 0.4 MG CAPS capsule Take 0.4 mg by mouth every evening.    . timolol (TIMOPTIC) 0.5 % ophthalmic solution Place 1 drop into both eyes 2 (two) times daily.   0  . valsartan (DIOVAN) 80 MG tablet Take 1 tablet (80 mg total) by mouth daily. 90 tablet 3  . bisacodyl (DULCOLAX) 5 MG EC tablet Take 1 tablet (5 mg total) by mouth daily as needed for moderate constipation. (Patient not taking: Reported on 06/18/2020) 30 tablet 0  . dutasteride (AVODART) 0.5 MG capsule TAKE 1 CAPSULE BY MOUTH EVERY DAY (Patient taking differently: Take 0.5 mg by mouth daily. ) 90 capsule 3   No current facility-administered  medications on file prior to visit.    Allergies  Allergen Reactions  . Antihistamines, Diphenhydramine-Type     Blood in urine   . Aspirin     Blood in urine   . Codeine Nausea And Vomiting    Must take with food  . Pollen Extract Cough    Past Medical History:  Diagnosis Date  . Allergic rhinitis   . Anticoagulant long-term use    xarelto  . Anxiety   . Benign localized prostatic hyperplasia with lower urinary tract symptoms (LUTS)   . Benzodiazepine dependence ([place])   . Constipation    . Diverticulosis   . Edema of right lower extremity   . GERD (gastroesophageal reflux disease)   . Heart murmur   . Hematuria   . Hepatitis    History of  . History of colonic polyps   . History of CVA (cerebrovascular accident) without residual deficits 07/26/2017   crytogenic stroke -- acute left caudate body infarct   . History of kidney stones   . Hypertension   . Ischemic cardiomyopathy 07/26/2017   ef 35-40%;    TEE 07-31-2017 ef 60-65%  . Left anterior fascicular block 02/26/2019   Noted on EKG  . LVH (left ventricular hypertrophy) 02/26/2019   Noted on EKG   . MDD (major depressive disorder), recurrent, in partial remission ([place])   . OA (osteoarthritis)    knees,   . OSA (obstructive sleep apnea)    intolerant cpap  . PAF (paroxysmal atrial fibrillation) [facility])    cardiologist-  dr g. taylor  . Skin cancer    Childhood, right foot on toe  . Status post placement of implantable loop recorder 07/31/2017  . Swelling of right foot 12/2018  . Throat mass   . Wears glasses     Past Surgical History:  Procedure Laterality Date  . APPENDECTOMY  1970s  . CATARACT EXTRACTION W/ INTRAOCULAR LENS  IMPLANT, BILATERAL Bilateral 2014  . CHOLECYSTECTOMY  1970s  . COLONOSCOPY  2014  . CYSTOSCOPY    . FEMUR IM NAIL Right 02/27/2019   Procedure: INTRAMEDULLARY (IM) NAIL FEMORAL;  Surgeon: Rod Can, MD;  Location: [facility];  Service: Orthopedics;  Laterality: Right;  . FOOT SURGERY Left yrs ago  . HERNIA REPAIR  2014  . INCISION AND DRAINAGE  04/28/2020   Procedure: INCISION AND DRAINAGE;  Surgeon: Rolm Bookbinder, MD;  Location: [place];  Service: General;;  . IR URETERAL STENT LEFT NEW ACCESS W/O SEP NEPHROSTOMY CATH  12/17/2019  . LOOP RECORDER INSERTION N/A 07/31/2017   Procedure: LOOP RECORDER INSERTION;  Surgeon: Evans Lance, MD;  Location: [place] [facility];  Service: Cardiovascular;  Laterality: N/A;  . NEPHROLITHOTOMY Left 12/17/2019   Procedure: NEPHROLITHOTOMY  PERCUTANEOUS;  Surgeon: Kathie Rhodes, MD;  Location: [facility];  Service: Urology;  Laterality: Left;  . TEE WITHOUT CARDIOVERSION N/A 07/31/2017   Procedure: TRANSESOPHAGEAL ECHOCARDIOGRAM (TEE) WITH LOOP;  Surgeon: Dorothy Spark, MD;  Location: [place];  Service: Cardiovascular;  Laterality: N/A;  . TOTAL KNEE ARTHROPLASTY Left 02/22/2019   Procedure: TOTAL KNEE ARTHROPLASTY;  Surgeon: [name], MD;  Location: [facility];  Service: Orthopedics;  Laterality: Left;  . WRIST FRACTURE SURGERY  1970s   right x2  unsure if any hardware remain   ;    left x1 with pinning then removed    Family History  Problem Relation Age of Onset  . Heart disease Mother   . Heart disease Father   .  Colon cancer Maternal Aunt 70    Social History   Socioeconomic History  . Marital status: Widowed    Spouse name: Not on file  . Number of children: 2  . Years of education: Not on file  . Highest education level: Not on file  Occupational History  . Occupation: Nurse, learning disability    Comment: retired  Tobacco Use  . Smoking status: Never Smoker  . Smokeless tobacco: Never Used  Vaping Use  . Vaping Use: Never used  Substance and Sexual Activity  . Alcohol use: No  . Drug use: Never  . Sexual activity: Not on file  Other Topics Concern  . Not on file  Social History Narrative   Divorced twice---first wife died   2 children---daughter local, son in [place]      Has living will   [place] is daughter   Would accept resuscitation   No prolonged tube feeds if cognitively unaware   Social Determinants of Health   Financial Resource Strain:   . Difficulty of Paying Living Expenses: Not on file  Food Insecurity:   . Worried About Charity fundraiser in the Last Year: Not on file  . Ran Out of Food in the Last Year: Not on file  Transportation Needs:   . Lack of Transportation (Medical): Not on file  . Lack of Transportation (Non-Medical): Not on file  Physical Activity:   .  Days of Exercise per Week: Not on file  . Minutes of Exercise per Session: Not on file  Stress:   . Feeling of Stress : Not on file  Social Connections:   . Frequency of Communication with Friends and Family: Not on file  . Frequency of Social Gatherings with Friends and Family: Not on file  . Attends Religious Services: Not on file  . Active Member of Clubs or Organizations: Not on file  . Attends Archivist Meetings: Not on file  . Marital Status: Not on file  Intimate Partner Violence:   . Fear of Current or Ex-Partner: Not on file  . Emotionally Abused: Not on file  . Physically Abused: Not on file  . Sexually Abused: Not on file   Review of Systems Some trouble pivoting since the hip repair. No major joint pains Appetite is okay Lost some weight in hospital--but gained back Sleeps okay--no CPAP Wears seat belt Teeth okay---overdue for dentist No skin lesions----has hard well water though.  Bowels are regular now--no blood     Objective:   Physical Exam Constitutional:      Appearance: Normal appearance.  HENT:     Mouth/Throat:     Comments: No oral lesions Eyes:     Conjunctiva/sclera: Conjunctivae normal.     Pupils: Pupils are equal, round, and reactive to light.  Cardiovascular:     Rate and Rhythm: Normal rate.     Pulses: Normal pulses.     Heart sounds: No murmur heard.  No gallop.      Comments: Irregular but may be sinus with lots of skips vs a fib Pulmonary:     Effort: Pulmonary effort is normal.     Breath sounds: Normal breath sounds. No wheezing or rales.  Abdominal:     Palpations: Abdomen is soft.     Tenderness: There is no abdominal tenderness.  Musculoskeletal:        General: No swelling.     Cervical back: Neck supple.     Right lower leg:  No edema.     Left lower leg: No edema.  Lymphadenopathy:     Cervical: No cervical adenopathy.  Skin:    General: Skin is warm.     Findings: No rash.  Neurological:     Mental  Status: He is alert.     Comments: President---"Joseph Biden, Trump, Obama" [id number] Recall 3/3  Psychiatric:        Mood and Affect: Mood normal.        Behavior: Behavior normal.            Assessment & Plan:

## 2020-06-18 NOTE — Assessment & Plan Note (Signed)
Stressful year Social isolation has been tough Continue bupropion, xanax and mirtazapine

## 2020-06-22 ENCOUNTER — Ambulatory Visit (INDEPENDENT_AMBULATORY_CARE_PROVIDER_SITE_OTHER): Payer: Medicare HMO

## 2020-06-22 DIAGNOSIS — I639 Cerebral infarction, unspecified: Secondary | ICD-10-CM

## 2020-06-23 NOTE — Progress Notes (Signed)
Carelink Summary Report / Loop Recorder 

## 2020-06-24 ENCOUNTER — Encounter: Payer: PPO | Admitting: Internal Medicine

## 2020-06-25 ENCOUNTER — Telehealth: Payer: Self-pay

## 2020-06-25 NOTE — Telephone Encounter (Signed)
Pt lvm stating he was prescribed omeprazole for digestive problems.  Started pill yesterday and has had diarrhea all day today.  Pt is asking what he can do.  Please advise.

## 2020-06-26 NOTE — Telephone Encounter (Signed)
While it can cause diarrhea---a quick severe reaction like that is very uncommon. Have him try another dose (on empty stomach)---if he gets diarrhea again, he should stop. He could then try over the counter famotidine 20 mg twice a day (or I could send Rx for pantoprazole)

## 2020-06-26 NOTE — Telephone Encounter (Signed)
Left detailed message on VM per DPR. 

## 2020-07-12 DIAGNOSIS — R278 Other lack of coordination: Secondary | ICD-10-CM | POA: Diagnosis not present

## 2020-07-12 DIAGNOSIS — M1712 Unilateral primary osteoarthritis, left knee: Secondary | ICD-10-CM | POA: Diagnosis not present

## 2020-07-12 DIAGNOSIS — R2689 Other abnormalities of gait and mobility: Secondary | ICD-10-CM | POA: Diagnosis not present

## 2020-07-12 DIAGNOSIS — S72141D Displaced intertrochanteric fracture of right femur, subsequent encounter for closed fracture with routine healing: Secondary | ICD-10-CM | POA: Diagnosis not present

## 2020-07-12 DIAGNOSIS — Z8673 Personal history of transient ischemic attack (TIA), and cerebral infarction without residual deficits: Secondary | ICD-10-CM | POA: Diagnosis not present

## 2020-07-12 DIAGNOSIS — I480 Paroxysmal atrial fibrillation: Secondary | ICD-10-CM | POA: Diagnosis not present

## 2020-07-20 ENCOUNTER — Telehealth: Payer: Self-pay | Admitting: Internal Medicine

## 2020-07-20 MED ORDER — TAMSULOSIN HCL 0.4 MG PO CAPS: 0.4000 mg | ORAL_CAPSULE | Freq: Every evening | ORAL | 3 refills | Status: AC

## 2020-07-20 NOTE — Telephone Encounter (Signed)
[name] called in wanted to know about renewing his prescription pharmacy [phone] name of the medicine Tamsulouin .04mg 

## 2020-07-20 NOTE — Telephone Encounter (Signed)
Rx sent electronically.  

## 2020-07-22 LAB — CUP PACEART REMOTE DEVICE CHECK
Date Time Interrogation Session: 20211101233718
Implantable Pulse Generator Implant Date: 20181112

## 2020-07-26 ENCOUNTER — Other Ambulatory Visit: Payer: Self-pay | Admitting: Internal Medicine

## 2020-07-27 ENCOUNTER — Ambulatory Visit (INDEPENDENT_AMBULATORY_CARE_PROVIDER_SITE_OTHER): Payer: Medicare HMO

## 2020-07-27 ENCOUNTER — Other Ambulatory Visit: Payer: Self-pay | Admitting: Internal Medicine

## 2020-07-27 DIAGNOSIS — I639 Cerebral infarction, unspecified: Secondary | ICD-10-CM | POA: Diagnosis not present

## 2020-07-27 NOTE — Progress Notes (Signed)
Carelink Summary Report / Loop Recorder 

## 2020-08-12 DIAGNOSIS — R278 Other lack of coordination: Secondary | ICD-10-CM | POA: Diagnosis not present

## 2020-08-12 DIAGNOSIS — I480 Paroxysmal atrial fibrillation: Secondary | ICD-10-CM | POA: Diagnosis not present

## 2020-08-12 DIAGNOSIS — Z8673 Personal history of transient ischemic attack (TIA), and cerebral infarction without residual deficits: Secondary | ICD-10-CM | POA: Diagnosis not present

## 2020-08-12 DIAGNOSIS — M1712 Unilateral primary osteoarthritis, left knee: Secondary | ICD-10-CM | POA: Diagnosis not present

## 2020-08-12 DIAGNOSIS — R2689 Other abnormalities of gait and mobility: Secondary | ICD-10-CM | POA: Diagnosis not present

## 2020-08-12 DIAGNOSIS — S72141D Displaced intertrochanteric fracture of right femur, subsequent encounter for closed fracture with routine healing: Secondary | ICD-10-CM | POA: Diagnosis not present

## 2020-08-17 ENCOUNTER — Other Ambulatory Visit: Payer: Self-pay | Admitting: Internal Medicine

## 2020-08-17 MED ORDER — ALPRAZOLAM 1 MG PO TABS: 0.5000 mg | ORAL_TABLET | Freq: Three times a day (TID) | ORAL | 0 refills | Status: DC | PRN

## 2020-08-17 NOTE — Telephone Encounter (Signed)
Last filled 05-12-20 #45 Last OV 06-18-20 Next OV 06-30-21 [facility]

## 2020-08-17 NOTE — Telephone Encounter (Signed)
Pt called in wanted to know about getting a refill of the Xanax and its under [employer].   Please advise

## 2020-08-18 ENCOUNTER — Other Ambulatory Visit: Payer: Self-pay | Admitting: *Deleted

## 2020-08-18 NOTE — Telephone Encounter (Signed)
Patient called stating that his pharmacy requested a refill on his Alprazolam yesterday. Patient stated that his pharmacy told him today that they were waiting to hear back from his doctor. Patient was advised that we usually require at least a 24 hour turn around on refills. Patient was advised that the refill was sent to [facility] yesterday for #45 and shows that they received it.  Advised patient that he should reach out to his pharmacy because they probably have it and it just has not been processed yet.

## 2020-08-21 ENCOUNTER — Inpatient Hospital Stay (HOSPITAL_COMMUNITY)
Admission: EM | Admit: 2020-08-21 | Discharge: 2020-08-25 | DRG: 536 | Disposition: A | Discharge status: Skilled Nursing Facility | Payer: Medicare HMO | Attending: Internal Medicine | Admitting: Internal Medicine

## 2020-08-21 ENCOUNTER — Encounter (HOSPITAL_COMMUNITY): Payer: Self-pay | Admitting: Emergency Medicine

## 2020-08-21 ENCOUNTER — Emergency Department (HOSPITAL_COMMUNITY): Payer: Medicare HMO

## 2020-08-21 ENCOUNTER — Other Ambulatory Visit: Payer: Self-pay

## 2020-08-21 DIAGNOSIS — I10 Essential (primary) hypertension: Secondary | ICD-10-CM | POA: Diagnosis present

## 2020-08-21 DIAGNOSIS — I119 Hypertensive heart disease without heart failure: Secondary | ICD-10-CM | POA: Diagnosis present

## 2020-08-21 DIAGNOSIS — R5381 Other malaise: Secondary | ICD-10-CM | POA: Diagnosis not present

## 2020-08-21 DIAGNOSIS — Z96652 Presence of left artificial knee joint: Secondary | ICD-10-CM | POA: Diagnosis present

## 2020-08-21 DIAGNOSIS — K219 Gastro-esophageal reflux disease without esophagitis: Secondary | ICD-10-CM | POA: Diagnosis present

## 2020-08-21 DIAGNOSIS — Z885 Allergy status to narcotic agent status: Secondary | ICD-10-CM | POA: Diagnosis not present

## 2020-08-21 DIAGNOSIS — Z20822 Contact with and (suspected) exposure to covid-19: Secondary | ICD-10-CM | POA: Diagnosis not present

## 2020-08-21 DIAGNOSIS — Y92009 Unspecified place in unspecified non-institutional (private) residence as the place of occurrence of the external cause: Secondary | ICD-10-CM | POA: Diagnosis not present

## 2020-08-21 DIAGNOSIS — W010XXA Fall on same level from slipping, tripping and stumbling without subsequent striking against object, initial encounter: Secondary | ICD-10-CM | POA: Diagnosis present

## 2020-08-21 DIAGNOSIS — Z9841 Cataract extraction status, right eye: Secondary | ICD-10-CM

## 2020-08-21 DIAGNOSIS — Z961 Presence of intraocular lens: Secondary | ICD-10-CM | POA: Diagnosis present

## 2020-08-21 DIAGNOSIS — F29 Unspecified psychosis not due to a substance or known physiological condition: Secondary | ICD-10-CM | POA: Diagnosis not present

## 2020-08-21 DIAGNOSIS — Z800 Family history of malignant neoplasm of digestive organs: Secondary | ICD-10-CM

## 2020-08-21 DIAGNOSIS — I255 Ischemic cardiomyopathy: Secondary | ICD-10-CM | POA: Diagnosis present

## 2020-08-21 DIAGNOSIS — Z888 Allergy status to other drugs, medicaments and biological substances status: Secondary | ICD-10-CM | POA: Diagnosis not present

## 2020-08-21 DIAGNOSIS — S32592A Other specified fracture of left pubis, initial encounter for closed fracture: Secondary | ICD-10-CM | POA: Diagnosis not present

## 2020-08-21 DIAGNOSIS — I444 Left anterior fascicular block: Secondary | ICD-10-CM | POA: Diagnosis present

## 2020-08-21 DIAGNOSIS — Y93K1 Activity, walking an animal: Secondary | ICD-10-CM | POA: Diagnosis not present

## 2020-08-21 DIAGNOSIS — R52 Pain, unspecified: Secondary | ICD-10-CM | POA: Diagnosis not present

## 2020-08-21 DIAGNOSIS — Z9842 Cataract extraction status, left eye: Secondary | ICD-10-CM

## 2020-08-21 DIAGNOSIS — W19XXXA Unspecified fall, initial encounter: Secondary | ICD-10-CM

## 2020-08-21 DIAGNOSIS — Z043 Encounter for examination and observation following other accident: Secondary | ICD-10-CM | POA: Diagnosis not present

## 2020-08-21 DIAGNOSIS — F32A Depression, unspecified: Secondary | ICD-10-CM | POA: Diagnosis not present

## 2020-08-21 DIAGNOSIS — Z9049 Acquired absence of other specified parts of digestive tract: Secondary | ICD-10-CM | POA: Diagnosis not present

## 2020-08-21 DIAGNOSIS — Z87442 Personal history of urinary calculi: Secondary | ICD-10-CM | POA: Diagnosis not present

## 2020-08-21 DIAGNOSIS — F419 Anxiety disorder, unspecified: Secondary | ICD-10-CM | POA: Diagnosis not present

## 2020-08-21 DIAGNOSIS — S32512A Fracture of superior rim of left pubis, initial encounter for closed fracture: Secondary | ICD-10-CM | POA: Diagnosis not present

## 2020-08-21 DIAGNOSIS — M2550 Pain in unspecified joint: Secondary | ICD-10-CM | POA: Diagnosis not present

## 2020-08-21 DIAGNOSIS — C4490 Unspecified malignant neoplasm of skin, unspecified: Secondary | ICD-10-CM | POA: Diagnosis present

## 2020-08-21 DIAGNOSIS — G4733 Obstructive sleep apnea (adult) (pediatric): Secondary | ICD-10-CM | POA: Diagnosis not present

## 2020-08-21 DIAGNOSIS — S32492A Other specified fracture of left acetabulum, initial encounter for closed fracture: Secondary | ICD-10-CM | POA: Diagnosis not present

## 2020-08-21 DIAGNOSIS — Z79899 Other long term (current) drug therapy: Secondary | ICD-10-CM | POA: Diagnosis not present

## 2020-08-21 DIAGNOSIS — N400 Enlarged prostate without lower urinary tract symptoms: Secondary | ICD-10-CM | POA: Diagnosis not present

## 2020-08-21 DIAGNOSIS — Z8673 Personal history of transient ischemic attack (TIA), and cerebral infarction without residual deficits: Secondary | ICD-10-CM

## 2020-08-21 DIAGNOSIS — I480 Paroxysmal atrial fibrillation: Secondary | ICD-10-CM | POA: Diagnosis not present

## 2020-08-21 DIAGNOSIS — F418 Other specified anxiety disorders: Secondary | ICD-10-CM | POA: Diagnosis not present

## 2020-08-21 DIAGNOSIS — Z7401 Bed confinement status: Secondary | ICD-10-CM | POA: Diagnosis not present

## 2020-08-21 DIAGNOSIS — Z8249 Family history of ischemic heart disease and other diseases of the circulatory system: Secondary | ICD-10-CM

## 2020-08-21 DIAGNOSIS — Z7901 Long term (current) use of anticoagulants: Secondary | ICD-10-CM

## 2020-08-21 DIAGNOSIS — R41841 Cognitive communication deficit: Secondary | ICD-10-CM | POA: Diagnosis not present

## 2020-08-21 DIAGNOSIS — Z86010 Personal history of colonic polyps: Secondary | ICD-10-CM

## 2020-08-21 DIAGNOSIS — R2681 Unsteadiness on feet: Secondary | ICD-10-CM | POA: Diagnosis not present

## 2020-08-21 DIAGNOSIS — Z66 Do not resuscitate: Secondary | ICD-10-CM | POA: Diagnosis present

## 2020-08-21 LAB — RESP PANEL BY RT-PCR (FLU A&B, COVID) ARPGX2
Influenza A by PCR: NEGATIVE
Influenza B by PCR: NEGATIVE
SARS Coronavirus 2 by RT PCR: NEGATIVE

## 2020-08-21 LAB — CBC WITH DIFFERENTIAL/PLATELET
Abs Immature Granulocytes: 0.04 10*3/uL (ref 0.00–0.07)
Basophils Absolute: 0.1 10*3/uL (ref 0.0–0.1)
Basophils Relative: 0 %
Eosinophils Absolute: 0 10*3/uL (ref 0.0–0.5)
Eosinophils Relative: 0 %
HCT: 42.6 % (ref 39.0–52.0)
Hemoglobin: 14.5 g/dL (ref 13.0–17.0)
Immature Granulocytes: 0 %
Lymphocytes Relative: 14 %
Lymphs Abs: 1.6 10*3/uL (ref 0.7–4.0)
MCH: 31 pg (ref 26.0–34.0)
MCHC: 34 g/dL (ref 30.0–36.0)
MCV: 91.2 fL (ref 80.0–100.0)
Monocytes Absolute: 1 10*3/uL (ref 0.1–1.0)
Monocytes Relative: 8 %
Neutro Abs: 9.2 10*3/uL — ABNORMAL HIGH (ref 1.7–7.7)
Neutrophils Relative %: 78 %
Platelets: 178 10*3/uL (ref 150–400)
RBC: 4.67 MIL/uL (ref 4.22–5.81)
RDW: 12.4 % (ref 11.5–15.5)
WBC: 11.9 10*3/uL — ABNORMAL HIGH (ref 4.0–10.5)
nRBC: 0 % (ref 0.0–0.2)

## 2020-08-21 LAB — BASIC METABOLIC PANEL
Anion gap: 12 (ref 5–15)
BUN: 17 mg/dL (ref 8–23)
CO2: 23 mmol/L (ref 22–32)
Calcium: 9 mg/dL (ref 8.9–10.3)
Chloride: 105 mmol/L (ref 98–111)
Creatinine, Ser: 1.06 mg/dL (ref 0.61–1.24)
GFR, Estimated: >60 mL/min (ref >60)
Glucose, Bld: 124 mg/dL — ABNORMAL HIGH (ref 70–99)
Potassium: 3.6 mmol/L (ref 3.5–5.1)
Sodium: 140 mmol/L (ref 135–145)

## 2020-08-21 MED ORDER — CYCLOSPORINE 0.05 % OP EMUL
1.0000 [drp] | Freq: Two times a day (BID) | OPHTHALMIC | Status: DC
  Administered 2020-08-22 – 2020-08-25 (×7): 1 [drp] via OPHTHALMIC
  Filled 2020-08-21 (×8): qty 1

## 2020-08-21 MED ORDER — ALPRAZOLAM 0.5 MG PO TABS
0.5000 mg | ORAL_TABLET | Freq: Three times a day (TID) | ORAL | Status: DC | PRN
  Administered 2020-08-21 – 2020-08-25 (×6): 0.5 mg via ORAL
  Filled 2020-08-21 (×6): qty 1

## 2020-08-21 MED ORDER — ONDANSETRON HCL 4 MG/2ML IJ SOLN
4.0000 mg | Freq: Once | INTRAMUSCULAR | Status: AC
  Administered 2020-08-21: 4 mg via INTRAVENOUS
  Filled 2020-08-21: qty 2

## 2020-08-21 MED ORDER — MORPHINE SULFATE (PF) 4 MG/ML IV SOLN
4.0000 mg | Freq: Once | INTRAVENOUS | Status: AC
  Administered 2020-08-21: 4 mg via INTRAVENOUS
  Filled 2020-08-21: qty 1

## 2020-08-21 MED ORDER — DUTASTERIDE 0.5 MG PO CAPS
0.5000 mg | ORAL_CAPSULE | Freq: Every day | ORAL | Status: DC
  Administered 2020-08-21 – 2020-08-25 (×5): 0.5 mg via ORAL
  Filled 2020-08-21 (×6): qty 1

## 2020-08-21 MED ORDER — ACETAMINOPHEN 325 MG PO TABS: 650.0000 mg | ORAL_TABLET | Freq: Four times a day (QID) | ORAL | Status: DC | PRN

## 2020-08-21 MED ORDER — PROPYLENE GLYCOL 0.6 % OP SOLN: 1.0000 [drp] | Freq: Every day | OPHTHALMIC | Status: DC | PRN

## 2020-08-21 MED ORDER — MIRTAZAPINE 15 MG PO TABS
15.0000 mg | ORAL_TABLET | Freq: Every day | ORAL | Status: DC
  Administered 2020-08-21 – 2020-08-24 (×4): 15 mg via ORAL
  Filled 2020-08-21 (×2): qty 1
  Filled 2020-08-21: qty 2
  Filled 2020-08-21: qty 1

## 2020-08-21 MED ORDER — IRBESARTAN 150 MG PO TABS
75.0000 mg | ORAL_TABLET | Freq: Every day | ORAL | Status: DC
  Administered 2020-08-22 – 2020-08-25 (×4): 75 mg via ORAL
  Filled 2020-08-21 (×4): qty 1

## 2020-08-21 MED ORDER — TAMSULOSIN HCL 0.4 MG PO CAPS
0.4000 mg | ORAL_CAPSULE | Freq: Every evening | ORAL | Status: DC
  Administered 2020-08-21 – 2020-08-24 (×4): 0.4 mg via ORAL
  Filled 2020-08-21 (×4): qty 1

## 2020-08-21 MED ORDER — AMLODIPINE BESYLATE 10 MG PO TABS
10.0000 mg | ORAL_TABLET | Freq: Every day | ORAL | Status: DC
  Administered 2020-08-22 – 2020-08-25 (×4): 10 mg via ORAL
  Filled 2020-08-21 (×3): qty 1
  Filled 2020-08-21: qty 2

## 2020-08-21 MED ORDER — RIVAROXABAN 20 MG PO TABS
20.0000 mg | ORAL_TABLET | Freq: Every day | ORAL | Status: DC
  Administered 2020-08-21 – 2020-08-24 (×4): 20 mg via ORAL
  Filled 2020-08-21 (×6): qty 1

## 2020-08-21 MED ORDER — BUPROPION HCL ER (SR) 150 MG PO TB12
150.0000 mg | ORAL_TABLET | Freq: Two times a day (BID) | ORAL | Status: DC
  Administered 2020-08-21 – 2020-08-25 (×8): 150 mg via ORAL
  Filled 2020-08-21 (×8): qty 1

## 2020-08-21 MED ORDER — CARVEDILOL 6.25 MG PO TABS
6.2500 mg | ORAL_TABLET | Freq: Two times a day (BID) | ORAL | Status: DC
  Administered 2020-08-22 – 2020-08-25 (×7): 6.25 mg via ORAL
  Filled 2020-08-21: qty 1
  Filled 2020-08-21: qty 2
  Filled 2020-08-21 (×5): qty 1

## 2020-08-21 MED ORDER — POLYVINYL ALCOHOL 1.4 % OP SOLN
1.0000 [drp] | OPHTHALMIC | Status: DC | PRN
  Filled 2020-08-21: qty 15

## 2020-08-21 MED ORDER — SENNOSIDES-DOCUSATE SODIUM 8.6-50 MG PO TABS: 1.0000 | ORAL_TABLET | Freq: Every evening | ORAL | Status: DC | PRN

## 2020-08-21 MED ORDER — MORPHINE SULFATE (PF) 2 MG/ML IV SOLN
0.5000 mg | INTRAVENOUS | Status: DC | PRN
  Administered 2020-08-22: 0.5 mg via INTRAVENOUS
  Filled 2020-08-21: qty 1

## 2020-08-21 MED ORDER — HYDROCODONE-ACETAMINOPHEN 5-325 MG PO TABS
1.0000 | ORAL_TABLET | Freq: Four times a day (QID) | ORAL | Status: DC | PRN
  Filled 2020-08-21: qty 1

## 2020-08-21 NOTE — H&P (Signed)
History and Physical    Jared [name] VXY:[id number] DOB: 03-31-1941 DOA: 08/21/2020  PCP: [name], MD  Patient coming from: Home via EMS  I have personally briefly reviewed patient's old medical records in [place]  Chief Complaint: Tailbone left hip pain after fall  HPI: Jared [name] is a 80 y.o. male with medical history significant for paroxysmal atrial fibrillation on Xarelto, history of cryptogenic stroke, anxiety/depression, BPH who presents to the ED for evaluation after a fall.  Patient states he was walking into his home after walking the dog when he lost balance and fell backwards landing on his buttocks.  He had immediate tailbone and left hip pain.  He was unable to stand up or bear weight on his own.  He did not hit his head or lose consciousness.  He had some nausea after his fall which he attributes to his pain but did not vomit.  He otherwise denies any chest pain, palpitations, dyspnea, abdominal pain, dysuria.  He does take Xarelto with last dose taken evening of 08/20/2020.  Patient states he lives alone and does normally ambulate with the use of cane at all times however did not have a cane with him at time of his fall.  ED Course:  Initial vitals showed BP 135/90, pulse 62, RR 16, temp 98.3 Fahrenheit, SPO2 99% on room air.  Labs show WBC 11.9, hemoglobin 14.5, platelets 178,000, sodium 140, potassium 3.6, bicarb 23, BUN 17, creatinine 1.06, serum glucose 124.  SARS-CoV-2 PCR is negative.  Influenza A/B PCR negative.  Left hip x-ray was suspicious for acute fracture of the inferior left pubic ramus.  CT pelvis without contrast showed acute comminuted fracture through the left inferior pubic ramus and subtle fracture through the anteromedial aspect of the left acetabulum.  Patient was given IV morphine and Zofran.  Orthopedics were consulted and Dr. Veverly Fells has seen the patient in the ED.  They recommended nonweightbearing of the LLE for 6 weeks.  The  hospitalist service was consulted to me for further evaluation and management.  Review of Systems: All systems reviewed and are negative except as documented in history of present illness above.   Past Medical History:  Diagnosis Date  . Allergic rhinitis   . Anticoagulant long-term use    xarelto  . Anxiety   . Benign localized prostatic hyperplasia with lower urinary tract symptoms (LUTS)   . Benzodiazepine dependence ([place])   . Constipation   . Diverticulosis   . Edema of right lower extremity   . GERD (gastroesophageal reflux disease)   . Heart murmur   . Hematuria   . Hepatitis    History of  . History of colonic polyps   . History of CVA (cerebrovascular accident) without residual deficits 07/26/2017   crytogenic stroke -- acute left caudate body infarct   . History of kidney stones   . Hypertension   . Ischemic cardiomyopathy 07/26/2017   ef 35-40%;    TEE 07-31-2017 ef 60-65%  . Left anterior fascicular block 02/26/2019   Noted on EKG  . LVH (left ventricular hypertrophy) 02/26/2019   Noted on EKG   . MDD (major depressive disorder), recurrent, in partial remission ([place])   . OA (osteoarthritis)    knees,   . OSA (obstructive sleep apnea)    intolerant cpap  . PAF (paroxysmal atrial fibrillation) [facility])    cardiologist-  dr g. taylor  . Skin cancer    Childhood, right foot on toe  .  Status post placement of implantable loop recorder 07/31/2017  . Swelling of right foot 12/2018  . Throat mass   . Wears glasses     Past Surgical History:  Procedure Laterality Date  . APPENDECTOMY  1970s  . CATARACT EXTRACTION W/ INTRAOCULAR LENS  IMPLANT, BILATERAL Bilateral 2014  . CHOLECYSTECTOMY  1970s  . COLONOSCOPY  2014  . CYSTOSCOPY    . FEMUR IM NAIL Right 02/27/2019   Procedure: INTRAMEDULLARY (IM) NAIL FEMORAL;  Surgeon: Rod Can, MD;  Location: [facility];  Service: Orthopedics;  Laterality: Right;  . FOOT SURGERY Left yrs ago  . HERNIA REPAIR  2014  .  INCISION AND DRAINAGE  04/28/2020   Procedure: INCISION AND DRAINAGE;  Surgeon: Rolm Bookbinder, MD;  Location: [place];  Service: General;;  . IR URETERAL STENT LEFT NEW ACCESS W/O SEP NEPHROSTOMY CATH  12/17/2019  . LOOP RECORDER INSERTION N/A 07/31/2017   Procedure: LOOP RECORDER INSERTION;  Surgeon: Evans Lance, MD;  Location: [facility];  Service: Cardiovascular;  Laterality: N/A;  . NEPHROLITHOTOMY Left 12/17/2019   Procedure: NEPHROLITHOTOMY PERCUTANEOUS;  Surgeon: Kathie Rhodes, MD;  Location: [facility];  Service: Urology;  Laterality: Left;  . TEE WITHOUT CARDIOVERSION N/A 07/31/2017   Procedure: TRANSESOPHAGEAL ECHOCARDIOGRAM (TEE) WITH LOOP;  Surgeon: Dorothy Spark, MD;  Location: [place];  Service: Cardiovascular;  Laterality: N/A;  . TOTAL KNEE ARTHROPLASTY Left 02/22/2019   Procedure: TOTAL KNEE ARTHROPLASTY;  Surgeon: [name], MD;  Location: [facility];  Service: Orthopedics;  Laterality: Left;  . WRIST FRACTURE SURGERY  1970s   right x2  unsure if any hardware remain   ;    left x1 with pinning then removed    Social History:  reports that he has never smoked. He has never used smokeless tobacco. He reports that he does not drink alcohol and does not use drugs.  Allergies  Allergen Reactions  . Antihistamines, Diphenhydramine-Type     Blood in urine   . Aspirin     Blood in urine   . Codeine Nausea And Vomiting    Must take with food  . Pollen Extract Cough    Family History  Problem Relation Age of Onset  . Heart disease Mother   . Heart disease Father   . Colon cancer Maternal Aunt 70     Prior to Admission medications   Medication Sig Start Date End Date Taking? Authorizing Provider  acetaminophen (TYLENOL) 500 MG tablet Take 500 mg by mouth every 6 (six) hours as needed for moderate pain.   Yes [provider]  ALPRAZolam Duanne Moron) 1 MG tablet Take 0.5 tablets (0.5 mg total) by mouth 3 (three) times daily as needed for anxiety.  08/17/20  Yes Viviana Simpler I, MD  amLODipine (NORVASC) 10 MG tablet Take 1 tablet (10 mg total) by mouth daily. 10/29/19  Yes [name], MD  buPROPion (WELLBUTRIN SR) 150 MG 12 hr tablet Take 1 tablet (150 mg total) by mouth 2 (two) times daily. 11/05/19  Yes [name], MD  Calcium Carbonate Antacid (TUMS E-X PO) Take 2 tablets by mouth 2 (two) times daily as needed (heartburn).   Yes [provider]  carvedilol (COREG) 6.25 MG tablet TAKE 1 TABLET TWICE DAILY WITH MEALS Patient taking differently: Take 6.25 mg by mouth 2 (two) times daily with a meal.  07/27/20  Yes [name], MD  docusate sodium (COLACE) 100 MG capsule Take 1 capsule (100 mg total) by  mouth 2 (two) times daily. Patient taking differently: Take 100 mg by mouth 2 (two) times daily as needed.  04/28/20  Yes Meuth, Brooke A, PA-C  dutasteride (AVODART) 0.5 MG capsule TAKE 1 CAPSULE BY MOUTH EVERY DAY Patient taking differently: Take 0.5 mg by mouth daily.  09/03/19  Yes [name], MD  mirtazapine (REMERON) 15 MG tablet TAKE 1 TABLET BY MOUTH EVERYDAY AT BEDTIME Patient taking differently: Take 15 mg by mouth at bedtime.  03/21/20  Yes [name], MD  polyethylene glycol (MIRALAX / GLYCOLAX) 17 g packet Take 17 g by mouth 2 (two) times daily. Patient taking differently: Take 17 g by mouth 2 (two) times daily as needed.  04/28/20  Yes Meuth, Brooke A, PA-C  Propylene Glycol (SYSTANE BALANCE) 0.6 % SOLN Place 1 drop into both eyes daily as needed (dry eyes).   Yes [provider]  RESTASIS 0.05 % ophthalmic emulsion Place 1 drop 2 (two) times daily into both eyes. 08/03/17  Yes [provider]  rivaroxaban (XARELTO) 20 MG TABS tablet Take 1 tablet (20 mg total) by mouth daily with supper. 05/07/20  Yes [name], MD  tamsulosin (FLOMAX) 0.4 MG CAPS capsule Take 1 capsule (0.4 mg total) by mouth every evening. 07/20/20  Yes [name], MD  timolol (TIMOPTIC) 0.5 %  ophthalmic solution Place 1 drop into both eyes 2 (two) times daily as needed (dry eyes).  06/29/17  Yes [provider]  valsartan (DIOVAN) 80 MG tablet TAKE 1 TABLET EVERY DAY Patient taking differently: Take 80 mg by mouth daily.  07/27/20  Yes [name], MD  bisacodyl (DULCOLAX) 5 MG EC tablet Take 1 tablet (5 mg total) by mouth daily as needed for moderate constipation. Patient not taking: Reported on 08/21/2020 04/28/20   Wellington Hampshire, PA-C  omeprazole (PRILOSEC) 20 MG capsule Take 1 capsule (20 mg total) by mouth daily. Patient not taking: Reported on 08/21/2020 06/18/20   [name], MD    Physical Exam: Vitals:   08/21/20 1241 08/21/20 1458 08/21/20 1706  BP: (!) 155/90 (!) 157/82 (!) 162/93  Pulse: 62 70 65  Resp: 16 17 18   Temp: 98.3 F (36.8 C) 98.4 F (36.9 C)   TempSrc: Oral Oral   SpO2: 99% 96% 96%   Constitutional: Elderly man resting in bed with head elevated, NAD, calm, comfortable Eyes: PERRL, lids and conjunctivae normal ENMT: Mucous membranes are moist. Posterior pharynx clear of any exudate or lesions.Normal dentition.  Neck: normal, supple, no masses. Respiratory: clear to auscultation anteriorly.  Normal respiratory effort. No accessory muscle use.  Cardiovascular: Regular rate and rhythm, no murmurs / rubs / gallops. No extremity edema. 2+ pedal pulses. Abdomen: no tenderness, no masses palpated. No hepatosplenomegaly. Bowel sounds positive.  Musculoskeletal: no clubbing / cyanosis.  ROM diminished at the hips due to pain from pelvic fracture.  Dorsiflexion and plantarflexion intact bilateral feet. Skin: no rashes, lesions, ulcers. No induration Neurologic: CN 2-12 grossly intact. Sensation intact, Strength diminished LLE and RLE at the hips due to pelvic fracture, otherwise intact. Psychiatric: Normal judgment and insight. Alert and oriented x 3. Normal mood.     Labs on Admission: I have personally reviewed following labs and imaging  studies  CBC: Recent Labs  Lab 08/21/20 1525  WBC 11.9*  NEUTROABS 9.2*  HGB 14.5  HCT 42.6  MCV 91.2  PLT 161   Basic Metabolic Panel: Recent Labs  Lab 08/21/20 1525  NA  140  K 3.6  CL 105  CO2 23  GLUCOSE 124*  BUN 17  CREATININE 1.06  CALCIUM 9.0   GFR: CrCl cannot be calculated (Unknown ideal weight.). Liver Function Tests: No results for input(s): AST, ALT, ALKPHOS, BILITOT, PROT, ALBUMIN in the last 168 hours. No results for input(s): LIPASE, AMYLASE in the last 168 hours. No results for input(s): AMMONIA in the last 168 hours. Coagulation Profile: No results for input(s): INR, PROTIME in the last 168 hours. Cardiac Enzymes: No results for input(s): CKTOTAL, CKMB, CKMBINDEX, TROPONINI in the last 168 hours. BNP (last 3 results) No results for input(s): PROBNP in the last 8760 hours. HbA1C: No results for input(s): HGBA1C in the last 72 hours. CBG: No results for input(s): GLUCAP in the last 168 hours. Lipid Profile: No results for input(s): CHOL, HDL, LDLCALC, TRIG, CHOLHDL, LDLDIRECT in the last 72 hours. Thyroid Function Tests: No results for input(s): TSH, T4TOTAL, FREET4, T3FREE, THYROIDAB in the last 72 hours. Anemia Panel: No results for input(s): VITAMINB12, FOLATE, FERRITIN, TIBC, IRON, RETICCTPCT in the last 72 hours. Urine analysis:    Component Value Date/Time   COLORURINE YELLOW 04/26/2020 2339   APPEARANCEUR CLEAR 04/26/2020 2339   LABSPEC 1.017 04/26/2020 2339   PHURINE 5.0 04/26/2020 2339   GLUCOSEU 50 (A) 04/26/2020 2339   GLUCOSEU NEGATIVE 10/29/2018 0932   HGBUR MODERATE (A) 04/26/2020 2339   BILIRUBINUR NEGATIVE 04/26/2020 2339   BILIRUBINUR Neg 10/28/2019 1728   KETONESUR NEGATIVE 04/26/2020 2339   PROTEINUR 30 (A) 04/26/2020 2339   UROBILINOGEN 0.2 10/28/2019 1728   UROBILINOGEN 0.2 10/29/2018 0932   NITRITE NEGATIVE 04/26/2020 2339   LEUKOCYTESUR SMALL (A) 04/26/2020 2339    Radiological Exams on Admission: CT PELVIS WO  CONTRAST  Result Date: 08/21/2020 CLINICAL DATA:  Possible fracture seen on hip x-ray after trauma today. Pain. EXAM: CT PELVIS WITHOUT CONTRAST TECHNIQUE: Multidetector CT imaging of the pelvis was performed following the standard protocol without intravenous contrast. COMPARISON:  August 21, 2020 hip x-ray. CT of the pelvis April 27, 2020 FINDINGS: Urinary Tract:  No abnormality visualized. Bowel:  Unremarkable visualized pelvic bowel loops. Vascular/Lymphatic: Calcified atherosclerosis in the distal the abdominal aorta, iliac vessels, and femoral vessels. No adenopathy. Reproductive:  Prostate enlargement. Other:  No free air free fluid. Musculoskeletal: The patient is status post repair of a previous right hip fracture. Hardware is in stable position. The right hip is stable. There is a mildly displaced left inferior pubic ramus fracture. There is a fracture through the anteromedial aspect of the left acetabulum as seen on coronal image 17, axial image 82 and sagittal image 84. No other fractures are identified. IMPRESSION: 1. There is an acute comminuted fracture through the left inferior pubic ramus. 2. There is a subtle fracture through the anteromedial aspect of the left acetabulum as above. 3. No other acute abnormalities. Electronically Signed   By: Dorise Bullion III M.D   On: 08/21/2020 16:40   DG Hip Unilat With Pelvis 2-3 Views Left  Result Date: 08/21/2020 CLINICAL DATA:  Fall EXAM: DG HIP (WITH OR WITHOUT PELVIS) 2-3V LEFT COMPARISON:  None. FINDINGS: Fracture of the inferior left pubic ramus is suspected. There is alignment at the left hip. No acute left hip fracture. Mild changes of osteoarthritis. IMPRESSION: Suspected acute fracture of the inferior left pubic ramus. Electronically Signed   By: Macy Mis M.D.   On: 08/21/2020 13:44    EKG: Not performed.  Assessment/Plan Principal Problem:   Closed fracture  of left inferior pubic ramus (HCC) Active Problems:   Essential  hypertension   BPH (benign prostatic hyperplasia)   Paroxysmal atrial fibrillation (HCC)   History of CVA (cerebrovascular accident)   Depression with anxiety  Jared [name] is a 79 y.o. male with medical history significant for paroxysmal atrial fibrillation on Xarelto, history of cryptogenic stroke, anxiety/depression, BPH who is admitted with left superior pubic rami fractures after mechanical fall.  Acute comminuted fracture of the left inferior pubic ramus: Occurring after mechanical fall with extension of the left anterior acetabulum.  Patient has been seen by orthopedics, Dr. Veverly Fells.  Nonsurgical management recommended with nonweightbearing for 6 weeks of the left lower extremity. -Nonweightbearing LLE -Mobilize with PT/OT -Continue Xarelto -TOC consult for potential SNF/rehab placement -Pain control with Tylenol, Norco, IV morphine only as needed  Paroxysmal atrial fibrillation: Controlled rate on admission. -Continue Coreg -Continue Xarelto  History of cryptogenic stroke: -Continue Xarelto  Hypertension: -Continue home amlodipine, Coreg, valsartan  BPH: -Continue dutasteride and tamsulosin  Depression/anxiety: -Continue home mirtazapine, bupropion, and as needed Xanax  DVT prophylaxis: Xarelto Code Status: DNR, confirmed with patient Family Communication: Discussed with patient's cousin at bedside Disposition Plan: From home, dispo to home versus SNF pending PT/OT eval Consults called: Orthopedics Admission status:  Status is: Inpatient  Remains inpatient appropriate because:Ongoing active pain requiring inpatient pain management and Unsafe d/c plan   Dispo: The patient is from: Home              Anticipated d/c is to: Home versus SNF              Anticipated d/c date is: 2 days              Patient currently is not medically stable to d/c.   Zada Finders MD [facility]  If 7PM-7AM, please contact night-coverage [email or web address]  08/21/2020, 6:28 PM

## 2020-08-21 NOTE — ED Triage Notes (Signed)
Per EMS, patient from home, tripped on step walking dog. C/o pain to tailbone and left hip. Denies neck and back pain. Denies LOC and head injury. Takes Xarelto.

## 2020-08-21 NOTE — Consult Note (Signed)
Reason for Consult:left hip pain Referring Physician: EDP  EKANSH SHERK is an 79 y.o. male.  HPI: 79 yo male with history of ground level fall this afternoon when he lost his balance, falling on hard ground.  The patient complained of immediate left hip pain and was unable to stand after the injury. He denies LOC or other complaints.   Past Medical History:  Diagnosis Date  . Allergic rhinitis   . Anticoagulant long-term use    xarelto  . Anxiety   . Benign localized prostatic hyperplasia with lower urinary tract symptoms (LUTS)   . Benzodiazepine dependence ([place])   . Constipation   . Diverticulosis   . Edema of right lower extremity   . GERD (gastroesophageal reflux disease)   . Heart murmur   . Hematuria   . Hepatitis    History of  . History of colonic polyps   . History of CVA (cerebrovascular accident) without residual deficits 07/26/2017   crytogenic stroke -- acute left caudate body infarct   . History of kidney stones   . Hypertension   . Ischemic cardiomyopathy 07/26/2017   ef 35-40%;    TEE 07-31-2017 ef 60-65%  . Left anterior fascicular block 02/26/2019   Noted on EKG  . LVH (left ventricular hypertrophy) 02/26/2019   Noted on EKG   . MDD (major depressive disorder), recurrent, in partial remission ([place])   . OA (osteoarthritis)    knees,   . OSA (obstructive sleep apnea)    intolerant cpap  . PAF (paroxysmal atrial fibrillation) [facility])    cardiologist-  dr g. taylor  . Skin cancer    Childhood, right foot on toe  . Status post placement of implantable loop recorder 07/31/2017  . Swelling of right foot 12/2018  . Throat mass   . Wears glasses     Past Surgical History:  Procedure Laterality Date  . APPENDECTOMY  1970s  . CATARACT EXTRACTION W/ INTRAOCULAR LENS  IMPLANT, BILATERAL Bilateral 2014  . CHOLECYSTECTOMY  1970s  . COLONOSCOPY  2014  . CYSTOSCOPY    . FEMUR IM NAIL Right 02/27/2019   Procedure: INTRAMEDULLARY (IM) NAIL FEMORAL;  Surgeon:  Rod Can, MD;  Location: [facility];  Service: Orthopedics;  Laterality: Right;  . FOOT SURGERY Left yrs ago  . HERNIA REPAIR  2014  . INCISION AND DRAINAGE  04/28/2020   Procedure: INCISION AND DRAINAGE;  Surgeon: Rolm Bookbinder, MD;  Location: [place];  Service: General;;  . IR URETERAL STENT LEFT NEW ACCESS W/O SEP NEPHROSTOMY CATH  12/17/2019  . LOOP RECORDER INSERTION N/A 07/31/2017   Procedure: LOOP RECORDER INSERTION;  Surgeon: Evans Lance, MD;  Location: [place] [facility];  Service: Cardiovascular;  Laterality: N/A;  . NEPHROLITHOTOMY Left 12/17/2019   Procedure: NEPHROLITHOTOMY PERCUTANEOUS;  Surgeon: Kathie Rhodes, MD;  Location: [facility];  Service: Urology;  Laterality: Left;  . TEE WITHOUT CARDIOVERSION N/A 07/31/2017   Procedure: TRANSESOPHAGEAL ECHOCARDIOGRAM (TEE) WITH LOOP;  Surgeon: Dorothy Spark, MD;  Location: [place];  Service: Cardiovascular;  Laterality: N/A;  . TOTAL KNEE ARTHROPLASTY Left 02/22/2019   Procedure: TOTAL KNEE ARTHROPLASTY;  Surgeon: [name], MD;  Location: [facility];  Service: Orthopedics;  Laterality: Left;  . WRIST FRACTURE SURGERY  1970s   right x2  unsure if any hardware remain   ;    left x1 with pinning then removed    Family History  Problem Relation Age of Onset  . Heart disease Mother   .  Heart disease Father   . Colon cancer Maternal Aunt 70    Social History:  reports that he has never smoked. He has never used smokeless tobacco. He reports that he does not drink alcohol and does not use drugs.  Allergies:  Allergies  Allergen Reactions  . Antihistamines, Diphenhydramine-Type     Blood in urine   . Aspirin     Blood in urine   . Codeine Nausea And Vomiting    Must take with food  . Pollen Extract Cough    Medications: I have reviewed the patient's current medications.  Results for orders placed or performed during the hospital encounter of 08/21/20 (from the past 48 hour(s))  Basic metabolic panel     Status:  Abnormal   Collection Time: 08/21/20  3:25 PM  Result Value Ref Range   Sodium 140 135 - 145 mmol/L   Potassium 3.6 3.5 - 5.1 mmol/L   Chloride 105 98 - 111 mmol/L   CO2 23 22 - 32 mmol/L   Glucose, Bld 124 (H) 70 - 99 mg/dL    Comment: Glucose reference range applies only to samples taken after fasting for at least 8 hours.   BUN 17 8 - 23 mg/dL   Creatinine, Ser 1.06 0.61 - 1.24 mg/dL   Calcium 9.0 8.9 - 10.3 mg/dL   GFR, Estimated >60 >60 mL/min    Comment: (NOTE) Calculated using the CKD-EPI Creatinine Equation (2021)    Anion gap 12 5 - 15    Comment: Performed at [facility], [place] [address], [place], [place] [address]  CBC with Differential     Status: Abnormal   Collection Time: 08/21/20  3:25 PM  Result Value Ref Range   WBC 11.9 (H) 4.0 - 10.5 K/uL   RBC 4.67 4.22 - 5.81 MIL/uL   Hemoglobin 14.5 13.0 - 17.0 g/dL   HCT 42.6 39 - 52 %   MCV 91.2 80.0 - 100.0 fL   MCH 31.0 26.0 - 34.0 pg   MCHC 34.0 30.0 - 36.0 g/dL   RDW 12.4 11.5 - 15.5 %   Platelets 178 150 - 400 K/uL   nRBC 0.0 0.0 - 0.2 %   Neutrophils Relative % 78 %   Neutro Abs 9.2 (H) 1.7 - 7.7 K/uL   Lymphocytes Relative 14 %   Lymphs Abs 1.6 0.7 - 4.0 K/uL   Monocytes Relative 8 %   Monocytes Absolute 1.0 0.1 - 1.0 K/uL   Eosinophils Relative 0 %   Eosinophils Absolute 0.0 0.0 - 0.5 K/uL   Basophils Relative 0 %   Basophils Absolute 0.1 0.0 - 0.1 K/uL   Immature Granulocytes 0 %   Abs Immature Granulocytes 0.04 0.00 - 0.07 K/uL    Comment: Performed at [facility], [place] [address] [place], [place] [address]  Resp Panel by RT-PCR (Flu A&B, Covid) Nasopharyngeal Swab     Status: None   Collection Time: 08/21/20  3:26 PM   Specimen: Nasopharyngeal Swab; Nasopharyngeal(NP) swabs in vial transport medium  Result Value Ref Range   SARS Coronavirus 2 by RT PCR NEGATIVE NEGATIVE    Comment: (NOTE) SARS-CoV-2 target nucleic acids are NOT DETECTED.  The SARS-CoV-2 RNA  is generally detectable in upper respiratory specimens during the acute phase of infection. The lowest concentration of SARS-CoV-2 viral copies this assay can detect is 138 copies/mL. A negative result does not preclude SARS-Cov-2 infection and should not be used as the sole basis for  treatment or other patient management decisions. A negative result may occur with  improper specimen collection/handling, submission of specimen other than nasopharyngeal swab, presence of viral mutation(s) within the areas targeted by this assay, and inadequate number of viral copies(<138 copies/mL). A negative result must be combined with clinical observations, patient history, and epidemiological information. The expected result is Negative.  Fact Sheet for Patients:  EntrepreneurPulse.com.au  Fact Sheet for Healthcare Providers:  IncredibleEmployment.be  This test is no t yet approved or cleared by the [place] FDA and  has been authorized for detection and/or diagnosis of SARS-CoV-2 by FDA under an Emergency Use Authorization (EUA). This EUA will remain  in effect (meaning this test can be used) for the duration of the COVID-19 declaration under Section 564(b)(1) of the Act, 21 U.S.C.section 360bbb-3(b)(1), unless the authorization is terminated  or revoked sooner.       Influenza A by PCR NEGATIVE NEGATIVE   Influenza B by PCR NEGATIVE NEGATIVE    Comment: (NOTE) The Xpert Xpress SARS-CoV-2/FLU/RSV plus assay is intended as an aid in the diagnosis of influenza from Nasopharyngeal swab specimens and should not be used as a sole basis for treatment. Nasal washings and aspirates are unacceptable for Xpert Xpress SARS-CoV-2/FLU/RSV testing.  Fact Sheet for Patients: EntrepreneurPulse.com.au  Fact Sheet for Healthcare Providers: IncredibleEmployment.be  This test is not yet approved or cleared by the [place] FDA  and has been authorized for detection and/or diagnosis of SARS-CoV-2 by FDA under an Emergency Use Authorization (EUA). This EUA will remain in effect (meaning this test can be used) for the duration of the COVID-19 declaration under Section 564(b)(1) of the Act, 21 U.S.C. section 360bbb-3(b)(1), unless the authorization is terminated or revoked.  Performed at [facility], [place] [address], [place], [place] [address]     CT PELVIS WO CONTRAST  Result Date: 08/21/2020 CLINICAL DATA:  Possible fracture seen on hip x-ray after trauma today. Pain. EXAM: CT PELVIS WITHOUT CONTRAST TECHNIQUE: Multidetector CT imaging of the pelvis was performed following the standard protocol without intravenous contrast. COMPARISON:  August 21, 2020 hip x-ray. CT of the pelvis April 27, 2020 FINDINGS: Urinary Tract:  No abnormality visualized. Bowel:  Unremarkable visualized pelvic bowel loops. Vascular/Lymphatic: Calcified atherosclerosis in the distal the abdominal aorta, iliac vessels, and femoral vessels. No adenopathy. Reproductive:  Prostate enlargement. Other:  No free air free fluid. Musculoskeletal: The patient is status post repair of a previous right hip fracture. Hardware is in stable position. The right hip is stable. There is a mildly displaced left inferior pubic ramus fracture. There is a fracture through the anteromedial aspect of the left acetabulum as seen on coronal image 17, axial image 82 and sagittal image 84. No other fractures are identified. IMPRESSION: 1. There is an acute comminuted fracture through the left inferior pubic ramus. 2. There is a subtle fracture through the anteromedial aspect of the left acetabulum as above. 3. No other acute abnormalities. Electronically Signed   By: Dorise Bullion III M.D   On: 08/21/2020 16:40   DG Hip Unilat With Pelvis 2-3 Views Left  Result Date: 08/21/2020 CLINICAL DATA:  Fall EXAM: DG HIP (WITH OR WITHOUT PELVIS) 2-3V LEFT  COMPARISON:  None. FINDINGS: Fracture of the inferior left pubic ramus is suspected. There is alignment at the left hip. No acute left hip fracture. Mild changes of osteoarthritis. IMPRESSION: Suspected acute fracture of the inferior left pubic ramus. Electronically Signed   By: Addison Lank.D.  On: 08/21/2020 13:44    Review of Systems Blood pressure (!) 162/93, pulse 65, temperature 98.4 F (36.9 C), temperature source Oral, resp. rate 18, SpO2 96 %. Physical Exam AAO, NAD, neck non tender, normal AROM, bilateral shoulder ROM normal and pain free  No tenderness in the T or L spine.  Right LE pain free AROM and PROM of the hip, knee and ankle.  Left hip with painful PROM, leg lengths are equal, knee alignment is normal and there is no local tenderness, ankle ROM pain free. Good distal pulses, sensation intact  Assessment/Plan: Left superior and inferior pubic ramus fractures with extension into the left anterior acetabulum. No displacement Recommend Non weight bearing (6 weeks) on the left LE./ Mobilization with PT tomorrow.   Patient will be at increased risk for DVT over the next 6 weeks.  Patient lives alone so will have to be very mobile and stable prior to discharge Will follow  Augustin Schooling 08/21/2020, 6:47 PM

## 2020-08-21 NOTE — ED Provider Notes (Signed)
[facility] DEPT Provider Note   CSN: [id number] Arrival date & time: 08/21/20  1225     History Chief Complaint  Patient presents with  . Hip Pain    Jared [name] is a 79 y.o. male.  The history is provided by the patient, a relative and medical records.  Hip Pain   Jared [name] is a 79 y.o. male who presents to the Emergency Department complaining of, pelvic pain. He presents the emergency department for evaluation of injuries after a mechanical fall that occurred earlier today. He was walking his dog when the leash pulled and he fell down, landing on his bottom on the brakes. He experienced immediate severe pain to his pelvis, left greater than right. He has been unable to walk secondary to pain. He denies any head injury or loss of consciousness. No recent illnesses. He has a history of hypertension, CVA. Takes Xarelto. Symptoms are severe and constant nature.    Past Medical History:  Diagnosis Date  . Allergic rhinitis   . Anticoagulant long-term use    xarelto  . Anxiety   . Benign localized prostatic hyperplasia with lower urinary tract symptoms (LUTS)   . Benzodiazepine dependence ([place])   . Constipation   . Diverticulosis   . Edema of right lower extremity   . GERD (gastroesophageal reflux disease)   . Heart murmur   . Hematuria   . Hepatitis    History of  . History of colonic polyps   . History of CVA (cerebrovascular accident) without residual deficits 07/26/2017   crytogenic stroke -- acute left caudate body infarct   . History of kidney stones   . Hypertension   . Ischemic cardiomyopathy 07/26/2017   ef 35-40%;    TEE 07-31-2017 ef 60-65%  . Left anterior fascicular block 02/26/2019   Noted on EKG  . LVH (left ventricular hypertrophy) 02/26/2019   Noted on EKG   . MDD (major depressive disorder), recurrent, in partial remission ([place])   . OA (osteoarthritis)    knees,   . OSA (obstructive sleep apnea)    intolerant cpap   . PAF (paroxysmal atrial fibrillation) [facility])    cardiologist-  dr g. taylor  . Skin cancer    Childhood, right foot on toe  . Status post placement of implantable loop recorder 07/31/2017  . Swelling of right foot 12/2018  . Throat mass   . Wears glasses     Patient Active Problem List   Diagnosis Date Noted  . Closed fracture of left inferior pubic ramus ([place]) 08/21/2020  . History of CVA (cerebrovascular accident) 08/21/2020  . Depression with anxiety 08/21/2020  . Atherosclerosis of aorta ([place]) 06/18/2020  . Perirectal abscess 04/27/2020  . Class 1 obesity due to excess calories with body mass index (BMI) of 30.0 to 30.9 in adult 04/27/2020  . Renal calculus, left 12/17/2019  . LLQ pain 08/19/2019  . Preventative health care 06/17/2019  . Advance directive discussed with patient 06/17/2019  . Status post total knee replacement, left 02/22/2019  . Swelling of right foot 01/04/2019  . Osteoarthritis of right knee 06/20/2018  . Paroxysmal atrial fibrillation ([facility])   . MDD (major depressive disorder), recurrent, in partial remission ([place])   . Benzodiazepine dependence ([place])   . Ejection fraction < 50% 11/07/2017  . Frequent PVCs 07/27/2017  . Diverticulosis of colon without hemorrhage 02/06/2013  . Right inguinal hernia 10/12/2012  . BPH (benign prostatic hyperplasia) 04/08/2009  . OSA (obstructive sleep  apnea) 10/02/2008  . Essential hypertension 10/05/2007  . Allergic rhinitis 10/05/2007  . GERD 10/05/2007  . History of colonic polyps 10/05/2007  . NEPHROLITHIASIS, HX OF 10/05/2007    Past Surgical History:  Procedure Laterality Date  . APPENDECTOMY  1970s  . CATARACT EXTRACTION W/ INTRAOCULAR LENS  IMPLANT, BILATERAL Bilateral 2014  . CHOLECYSTECTOMY  1970s  . COLONOSCOPY  2014  . CYSTOSCOPY    . FEMUR IM NAIL Right 02/27/2019   Procedure: INTRAMEDULLARY (IM) NAIL FEMORAL;  Surgeon: Rod Can, MD;  Location: [facility];  Service: Orthopedics;  Laterality: Right;   . FOOT SURGERY Left yrs ago  . HERNIA REPAIR  2014  . INCISION AND DRAINAGE  04/28/2020   Procedure: INCISION AND DRAINAGE;  Surgeon: Rolm Bookbinder, MD;  Location: [facility];  Service: General;;  . IR URETERAL STENT LEFT NEW ACCESS W/O SEP NEPHROSTOMY CATH  12/17/2019  . LOOP RECORDER INSERTION N/A 07/31/2017   Procedure: LOOP RECORDER INSERTION;  Surgeon: Evans Lance, MD;  Location: [facility];  Service: Cardiovascular;  Laterality: N/A;  . NEPHROLITHOTOMY Left 12/17/2019   Procedure: NEPHROLITHOTOMY PERCUTANEOUS;  Surgeon: Kathie Rhodes, MD;  Location: [facility];  Service: Urology;  Laterality: Left;  . TEE WITHOUT CARDIOVERSION N/A 07/31/2017   Procedure: TRANSESOPHAGEAL ECHOCARDIOGRAM (TEE) WITH LOOP;  Surgeon: Dorothy Spark, MD;  Location: [facility];  Service: Cardiovascular;  Laterality: N/A;  . TOTAL KNEE ARTHROPLASTY Left 02/22/2019   Procedure: TOTAL KNEE ARTHROPLASTY;  Surgeon: [name], MD;  Location: [facility];  Service: Orthopedics;  Laterality: Left;  . WRIST FRACTURE SURGERY  1970s   right x2  unsure if any hardware remain   ;    left x1 with pinning then removed       Family History  Problem Relation Age of Onset  . Heart disease Mother   . Heart disease Father   . Colon cancer Maternal Aunt 70    Social History   Tobacco Use  . Smoking status: Never Smoker  . Smokeless tobacco: Never Used  Vaping Use  . Vaping Use: Never used  Substance Use Topics  . Alcohol use: No  . Drug use: Never    Home Medications Prior to Admission medications   Medication Sig Start Date End Date Taking? Authorizing Provider  acetaminophen (TYLENOL) 500 MG tablet Take 500 mg by mouth every 6 (six) hours as needed for moderate pain.   Yes [provider]  ALPRAZolam Duanne Moron) 1 MG tablet Take 0.5 tablets (0.5 mg total) by mouth 3 (three) times daily as needed for anxiety. 08/17/20  Yes Viviana Simpler I, MD  amLODipine (NORVASC) 10 MG tablet Take 1 tablet (10  mg total) by mouth daily. 10/29/19  Yes [name], MD  buPROPion (WELLBUTRIN SR) 150 MG 12 hr tablet Take 1 tablet (150 mg total) by mouth 2 (two) times daily. 11/05/19  Yes [name], MD  Calcium Carbonate Antacid (TUMS E-X PO) Take 2 tablets by mouth 2 (two) times daily as needed (heartburn).   Yes [provider]  carvedilol (COREG) 6.25 MG tablet TAKE 1 TABLET TWICE DAILY WITH MEALS Patient taking differently: Take 6.25 mg by mouth 2 (two) times daily with a meal.  07/27/20  Yes [name], MD  docusate sodium (COLACE) 100 MG capsule Take 1 capsule (100 mg total) by mouth 2 (two) times daily. Patient taking differently: Take 100 mg by mouth 2 (two) times daily as needed.  04/28/20  Yes Meuth, Jerene Pitch  A, PA-C  dutasteride (AVODART) 0.5 MG capsule TAKE 1 CAPSULE BY MOUTH EVERY DAY Patient taking differently: Take 0.5 mg by mouth daily.  09/03/19  Yes [name], MD  mirtazapine (REMERON) 15 MG tablet TAKE 1 TABLET BY MOUTH EVERYDAY AT BEDTIME Patient taking differently: Take 15 mg by mouth at bedtime.  03/21/20  Yes [name], MD  polyethylene glycol (MIRALAX / GLYCOLAX) 17 g packet Take 17 g by mouth 2 (two) times daily. Patient taking differently: Take 17 g by mouth 2 (two) times daily as needed.  04/28/20  Yes Meuth, Brooke A, PA-C  Propylene Glycol (SYSTANE BALANCE) 0.6 % SOLN Place 1 drop into both eyes daily as needed (dry eyes).   Yes [provider]  RESTASIS 0.05 % ophthalmic emulsion Place 1 drop 2 (two) times daily into both eyes. 08/03/17  Yes [provider]  rivaroxaban (XARELTO) 20 MG TABS tablet Take 1 tablet (20 mg total) by mouth daily with supper. 05/07/20  Yes [name], MD  tamsulosin (FLOMAX) 0.4 MG CAPS capsule Take 1 capsule (0.4 mg total) by mouth every evening. 07/20/20  Yes [name], MD  timolol (TIMOPTIC) 0.5 % ophthalmic solution Place 1 drop into both eyes 2 (two) times daily as needed (dry eyes).   06/29/17  Yes [provider]  valsartan (DIOVAN) 80 MG tablet TAKE 1 TABLET EVERY DAY Patient taking differently: Take 80 mg by mouth daily.  07/27/20  Yes [name], MD  bisacodyl (DULCOLAX) 5 MG EC tablet Take 1 tablet (5 mg total) by mouth daily as needed for moderate constipation. Patient not taking: Reported on 08/21/2020 04/28/20   Wellington Hampshire, PA-C  omeprazole (PRILOSEC) 20 MG capsule Take 1 capsule (20 mg total) by mouth daily. Patient not taking: Reported on 08/21/2020 06/18/20   Viviana Simpler I, MD    Allergies    Antihistamines, diphenhydramine-type; Aspirin; Codeine; and Pollen extract  Review of Systems   Review of Systems  All other systems reviewed and are negative.   Physical Exam Updated Vital Signs BP 125/70   Pulse (!) 52   Temp 98.4 F (36.9 C) (Oral)   Resp 19   SpO2 93%   Physical Exam Vitals and nursing note reviewed.  Constitutional:      Appearance: He is well-developed.  HENT:     Head: Normocephalic and atraumatic.  Cardiovascular:     Rate and Rhythm: Normal rate and regular rhythm.     Heart sounds: No murmur heard.   Pulmonary:     Effort: Pulmonary effort is normal. No respiratory distress.     Breath sounds: Normal breath sounds.  Abdominal:     Palpations: Abdomen is soft.     Tenderness: There is no abdominal tenderness. There is no guarding or rebound.  Musculoskeletal:        General: Tenderness present.     Comments: 2+ DP pulses bilaterally. He has pain referred to the left pelvis with range of motion of the right hip. Unable to range the left hip secondary to pain in the pelvis.  Skin:    General: Skin is warm and dry.  Neurological:     Mental Status: He is alert and oriented to person, place, and time.  Psychiatric:        Behavior: Behavior normal.     ED Results / Procedures / Treatments   Labs (all labs ordered are listed, but only abnormal results are displayed) Labs Reviewed  BASIC METABOLIC  PANEL - Abnormal; Notable for the following components:      Result Value   Glucose, Bld 124 (*)    All other components within normal limits  CBC WITH DIFFERENTIAL/PLATELET - Abnormal; Notable for the following components:   WBC 11.9 (*)    Neutro Abs 9.2 (*)    All other components within normal limits  RESP PANEL BY RT-PCR (FLU A&B, COVID) ARPGX2  VITAMIN D 25 HYDROXY (VIT D DEFICIENCY, FRACTURES)  CBC  BASIC METABOLIC PANEL    EKG None  Radiology CT PELVIS WO CONTRAST  Result Date: 08/21/2020 CLINICAL DATA:  Possible fracture seen on hip x-ray after trauma today. Pain. EXAM: CT PELVIS WITHOUT CONTRAST TECHNIQUE: Multidetector CT imaging of the pelvis was performed following the standard protocol without intravenous contrast. COMPARISON:  August 21, 2020 hip x-ray. CT of the pelvis April 27, 2020 FINDINGS: Urinary Tract:  No abnormality visualized. Bowel:  Unremarkable visualized pelvic bowel loops. Vascular/Lymphatic: Calcified atherosclerosis in the distal the abdominal aorta, iliac vessels, and femoral vessels. No adenopathy. Reproductive:  Prostate enlargement. Other:  No free air free fluid. Musculoskeletal: The patient is status post repair of a previous right hip fracture. Hardware is in stable position. The right hip is stable. There is a mildly displaced left inferior pubic ramus fracture. There is a fracture through the anteromedial aspect of the left acetabulum as seen on coronal image 17, axial image 82 and sagittal image 84. No other fractures are identified. IMPRESSION: 1. There is an acute comminuted fracture through the left inferior pubic ramus. 2. There is a subtle fracture through the anteromedial aspect of the left acetabulum as above. 3. No other acute abnormalities. Electronically Signed   By: Dorise Bullion III M.D   On: 08/21/2020 16:40   DG Hip Unilat With Pelvis 2-3 Views Left  Result Date: 08/21/2020 CLINICAL DATA:  Fall EXAM: DG HIP (WITH OR WITHOUT PELVIS)  2-3V LEFT COMPARISON:  None. FINDINGS: Fracture of the inferior left pubic ramus is suspected. There is alignment at the left hip. No acute left hip fracture. Mild changes of osteoarthritis. IMPRESSION: Suspected acute fracture of the inferior left pubic ramus. Electronically Signed   By: Macy Mis M.D.   On: 08/21/2020 13:44    Procedures Procedures (including critical care time)  Medications Ordered in ED Medications  HYDROcodone-acetaminophen (NORCO/VICODIN) 5-325 MG per tablet 1-2 tablet (has no administration in time range)  morphine 2 MG/ML injection 0.5 mg (has no administration in time range)  acetaminophen (TYLENOL) tablet 650 mg (has no administration in time range)  senna-docusate (Senokot-S) tablet 1 tablet (has no administration in time range)  ALPRAZolam (XANAX) tablet 0.5 mg (0.5 mg Oral Given 08/21/20 2300)  amLODipine (NORVASC) tablet 10 mg (has no administration in time range)  carvedilol (COREG) tablet 6.25 mg (has no administration in time range)  dutasteride (AVODART) capsule 0.5 mg (0.5 mg Oral Given 08/21/20 2300)  mirtazapine (REMERON) tablet 15 mg (15 mg Oral Given 08/21/20 2300)  cycloSPORINE (RESTASIS) 0.05 % ophthalmic emulsion 1 drop (0 drops Both Eyes Hold 08/21/20 2100)  rivaroxaban (XARELTO) tablet 20 mg (20 mg Oral Given 08/21/20 2300)  tamsulosin (FLOMAX) capsule 0.4 mg (0.4 mg Oral Given 08/21/20 2300)  irbesartan (AVAPRO) tablet 75 mg (has no administration in time range)  buPROPion (WELLBUTRIN SR) 12 hr tablet 150 mg (150 mg Oral Given 08/21/20 2300)  polyvinyl alcohol (LIQUIFILM TEARS) 1.4 % ophthalmic solution 1 drop (has no administration in time range)  morphine  4 MG/ML injection 4 mg (4 mg Intravenous Given 08/21/20 1548)  ondansetron (ZOFRAN) injection 4 mg (4 mg Intravenous Given 08/21/20 1545)    ED Course  I have reviewed the triage vital signs and the nursing notes.  Pertinent labs & imaging results that were available during my care of the  patient were reviewed by me and considered in my medical decision making (see chart for details).    MDM Rules/Calculators/A&P                         patient here for evaluation of left hip pain following a mechanical fall at home. Imaging is significant for pubic radius fracture. Discussed with Dr. Alma Friendly with orthopedics who recommends nonweightbearing to the left lower extremity for the time being. Discussed with patient findings of studies. Given his severe pain plan to admit for ongoing treatment. Hospitalist consulted for admission.  Final Clinical Impression(s) / ED Diagnoses Final diagnoses:  Fall, initial encounter  Other closed fracture of left pubis, initial encounter [facility])    Rx / DC Orders ED Discharge Orders    None       Quintella Reichert, MD 08/22/20 0113

## 2020-08-22 ENCOUNTER — Other Ambulatory Visit: Payer: Self-pay

## 2020-08-22 DIAGNOSIS — S32592A Other specified fracture of left pubis, initial encounter for closed fracture: Principal | ICD-10-CM

## 2020-08-22 LAB — BASIC METABOLIC PANEL
Anion gap: 8 (ref 5–15)
BUN: 18 mg/dL (ref 8–23)
CO2: 25 mmol/L (ref 22–32)
Calcium: 8.4 mg/dL — ABNORMAL LOW (ref 8.9–10.3)
Chloride: 105 mmol/L (ref 98–111)
Creatinine, Ser: 1.12 mg/dL (ref 0.61–1.24)
GFR, Estimated: >60 mL/min (ref >60)
Glucose, Bld: 112 mg/dL — ABNORMAL HIGH (ref 70–99)
Potassium: 3.3 mmol/L — ABNORMAL LOW (ref 3.5–5.1)
Sodium: 138 mmol/L (ref 135–145)

## 2020-08-22 LAB — CBC
HCT: 37.1 % — ABNORMAL LOW (ref 39.0–52.0)
Hemoglobin: 12.6 g/dL — ABNORMAL LOW (ref 13.0–17.0)
MCH: 31 pg (ref 26.0–34.0)
MCHC: 34 g/dL (ref 30.0–36.0)
MCV: 91.2 fL (ref 80.0–100.0)
Platelets: 146 10*3/uL — ABNORMAL LOW (ref 150–400)
RBC: 4.07 MIL/uL — ABNORMAL LOW (ref 4.22–5.81)
RDW: 12.4 % (ref 11.5–15.5)
WBC: 7.6 10*3/uL (ref 4.0–10.5)
nRBC: 0 % (ref 0.0–0.2)

## 2020-08-22 LAB — VITAMIN D 25 HYDROXY (VIT D DEFICIENCY, FRACTURES): Vit D, 25-Hydroxy: 16.46 ng/mL — ABNORMAL LOW (ref 30–100)

## 2020-08-22 MED ORDER — VITAMIN D (ERGOCALCIFEROL) 1.25 MG (50000 UNIT) PO CAPS
50000.0000 [IU] | ORAL_CAPSULE | ORAL | Status: DC
  Administered 2020-08-22: 50000 [IU] via ORAL
  Filled 2020-08-22: qty 1

## 2020-08-22 MED ORDER — VITAMIN D (ERGOCALCIFEROL) 1.25 MG (50000 UNIT) PO CAPS: 50000.0000 [IU] | ORAL_CAPSULE | ORAL | Status: DC

## 2020-08-22 MED ORDER — MORPHINE SULFATE (PF) 2 MG/ML IV SOLN
2.0000 mg | INTRAVENOUS | Status: DC | PRN
  Administered 2020-08-22 – 2020-08-23 (×4): 2 mg via INTRAVENOUS
  Filled 2020-08-22 (×5): qty 1

## 2020-08-22 MED ORDER — POTASSIUM CHLORIDE CRYS ER 20 MEQ PO TBCR
40.0000 meq | EXTENDED_RELEASE_TABLET | Freq: Once | ORAL | Status: AC
  Administered 2020-08-22: 40 meq via ORAL
  Filled 2020-08-22: qty 2

## 2020-08-22 MED ORDER — TRAMADOL HCL 50 MG PO TABS
50.0000 mg | ORAL_TABLET | Freq: Four times a day (QID) | ORAL | Status: DC | PRN
  Administered 2020-08-23 – 2020-08-25 (×4): 50 mg via ORAL
  Filled 2020-08-22 (×4): qty 1

## 2020-08-22 MED ORDER — POLYETHYLENE GLYCOL 3350 17 G PO PACK
17.0000 g | PACK | Freq: Every day | ORAL | Status: DC
  Administered 2020-08-23 – 2020-08-24 (×2): 17 g via ORAL
  Filled 2020-08-22 (×2): qty 1

## 2020-08-22 NOTE — ED Notes (Signed)
Repositioned patient.

## 2020-08-22 NOTE — Progress Notes (Signed)
PROGRESS NOTE    DENO SIDA  YBO:[id number] DOB: [birth year] DOA: 08/21/2020 PCP: [name], MD   Chief Complain: Fall, left hip pain  Brief Narrative: Patient is a 79 year old male with history of proximal A. fib on Xarelto, history of cryptogenic stroke, anxiety/depression, BPH who presents to the emergency department for evaluation of a fall at friend's home.  On presentation he was hemodynamically stable.  Left hip x-ray showed acute fracture of the inferior left pubic ramus.  CT pelvis without contrast showed acute comminuted fracture through the left inferior pubic ramus and septal fractures of the anteromedial aspect of the left acetabulum.  Orthopedics consulted who recommended nonoperative management and nonweightbearing of the left lower extremity for 6 weeks.  PT/OT consulted.  Assessment & Plan:   Principal Problem:   Closed fracture of left inferior pubic ramus (HCC) Active Problems:   Essential hypertension   BPH (benign prostatic hyperplasia)   Paroxysmal atrial fibrillation (HCC)   History of CVA (cerebrovascular accident)   Depression with anxiety   Acute comminuted fracture of the left inferior pubic ramus: Imagings as above.  Fell at home.  Usually ambulatory  at home,uses cane.  Orthopedics consulted and recommended nonoperative management.  Recommended nonweightbearing of the left lower extremity for 6 weeks.  PT/OT consulted.  Continue supportive care, pain management  Paroxysmal A. fib: Currently in normal sinus rhythm.  On Coreg for rate control.  On Xarelto for anticoagulation  History of cryptogenic stroke: Continue Xarelto  Hypertension: Continue current medications: Amlodipine, Coreg and valsartan.  Monitor blood pressure  BPH: On dutasteride and tamsulosin  Depression/anxiety: On mirtazapine, bupropion and as needed Xanax.         DVT prophylaxis: Xarelto Code Status: DNR Family Communication: None at bedside Status is:  Inpatient  Remains inpatient appropriate because:Unsafe d/c plan   Dispo: The patient is from: Home              Anticipated d/c is to: Home vs SNF              Anticipated d/c date is: 1 day              Patient currently is not medically stable to d/c. Awaiting PT/OT evaluation    Consultants: Orthopedics  Procedures:None  Antimicrobials:  Anti-infectives (From admission, onward)   None      Subjective: Patient seen and examined at the bedside this morning.  Hemodynamically stable during my evaluation.  Pain is well controlled but has some on the left hip when he moves.  Denies any new complaints  Objective: Vitals:   08/22/20 0500 08/22/20 0600 08/22/20 0700 08/22/20 0806  BP: 129/76 129/63 138/75 (!) 124/59  Pulse: (!) 58 (!) 55 (!) 58 62  Resp: 20 19 19 18   Temp:      TempSrc:      SpO2: 93% 93% 92% 95%   No intake or output data in the 24 hours ending 08/22/20 0856 There were no vitals filed for this visit.  Examination:  General exam: Appears calm and comfortable ,Not in distress HEENT:PERRL,Oral mucosa moist, Ear/Nose normal on gross exam Respiratory system: Bilateral equal air entry, normal vesicular breath sounds, no wheezes or crackles  Cardiovascular system: S1 & S2 heard, RRR. No JVD, murmurs, rubs, gallops or clicks. No pedal edema. Gastrointestinal system: Abdomen is nondistended, soft and nontender. No organomegaly or masses felt. Normal bowel sounds heard. Central nervous system: Alert and oriented. No focal neurological deficits. Extremities: No edema,  no clubbing ,no cyanosis Skin: No rashes, lesions or ulcers,no icterus ,no pallor   Data Reviewed: I have personally reviewed following labs and imaging studies  CBC: Recent Labs  Lab 08/21/20 1525 08/22/20 0320  WBC 11.9* 7.6  NEUTROABS 9.2*  --   HGB 14.5 12.6*  HCT 42.6 37.1*  MCV 91.2 91.2  PLT 178 885*   Basic Metabolic Panel: Recent Labs  Lab 08/21/20 1525 08/22/20 0320  NA  140 138  K 3.6 3.3*  CL 105 105  CO2 23 25  GLUCOSE 124* 112*  BUN 17 18  CREATININE 1.06 1.12  CALCIUM 9.0 8.4*   GFR: CrCl cannot be calculated (Unknown ideal weight.). Liver Function Tests: No results for input(s): AST, ALT, ALKPHOS, BILITOT, PROT, ALBUMIN in the last 168 hours. No results for input(s): LIPASE, AMYLASE in the last 168 hours. No results for input(s): AMMONIA in the last 168 hours. Coagulation Profile: No results for input(s): INR, PROTIME in the last 168 hours. Cardiac Enzymes: No results for input(s): CKTOTAL, CKMB, CKMBINDEX, TROPONINI in the last 168 hours. BNP (last 3 results) No results for input(s): PROBNP in the last 8760 hours. HbA1C: No results for input(s): HGBA1C in the last 72 hours. CBG: No results for input(s): GLUCAP in the last 168 hours. Lipid Profile: No results for input(s): CHOL, HDL, LDLCALC, TRIG, CHOLHDL, LDLDIRECT in the last 72 hours. Thyroid Function Tests: No results for input(s): TSH, T4TOTAL, FREET4, T3FREE, THYROIDAB in the last 72 hours. Anemia Panel: No results for input(s): VITAMINB12, FOLATE, FERRITIN, TIBC, IRON, RETICCTPCT in the last 72 hours. Sepsis Labs: No results for input(s): PROCALCITON, LATICACIDVEN in the last 168 hours.  Recent Results (from the past 240 hour(s))  Resp Panel by RT-PCR (Flu A&B, Covid) Nasopharyngeal Swab     Status: None   Collection Time: 08/21/20  3:26 PM   Specimen: Nasopharyngeal Swab; Nasopharyngeal(NP) swabs in vial transport medium  Result Value Ref Range Status   SARS Coronavirus 2 by RT PCR NEGATIVE NEGATIVE Final    Comment: (NOTE) SARS-CoV-2 target nucleic acids are NOT DETECTED.  The SARS-CoV-2 RNA is generally detectable in upper respiratory specimens during the acute phase of infection. The lowest concentration of SARS-CoV-2 viral copies this assay can detect is 138 copies/mL. A negative result does not preclude SARS-Cov-2 infection and should not be used as the sole basis  for treatment or other patient management decisions. A negative result may occur with  improper specimen collection/handling, submission of specimen other than nasopharyngeal swab, presence of viral mutation(s) within the areas targeted by this assay, and inadequate number of viral copies(<138 copies/mL). A negative result must be combined with clinical observations, patient history, and epidemiological information. The expected result is Negative.  Fact Sheet for Patients:  EntrepreneurPulse.com.au  Fact Sheet for Healthcare Providers:  IncredibleEmployment.be  This test is no t yet approved or cleared by the [place] FDA and  has been authorized for detection and/or diagnosis of SARS-CoV-2 by FDA under an Emergency Use Authorization (EUA). This EUA will remain  in effect (meaning this test can be used) for the duration of the COVID-19 declaration under Section 564(b)(1) of the Act, 21 U.S.C.section 360bbb-3(b)(1), unless the authorization is terminated  or revoked sooner.       Influenza A by PCR NEGATIVE NEGATIVE Final   Influenza B by PCR NEGATIVE NEGATIVE Final    Comment: (NOTE) The Xpert Xpress SARS-CoV-2/FLU/RSV plus assay is intended as an aid in the diagnosis of influenza from Nasopharyngeal swab  specimens and should not be used as a sole basis for treatment. Nasal washings and aspirates are unacceptable for Xpert Xpress SARS-CoV-2/FLU/RSV testing.  Fact Sheet for Patients: EntrepreneurPulse.com.au  Fact Sheet for Healthcare Providers: IncredibleEmployment.be  This test is not yet approved or cleared by the [place] FDA and has been authorized for detection and/or diagnosis of SARS-CoV-2 by FDA under an Emergency Use Authorization (EUA). This EUA will remain in effect (meaning this test can be used) for the duration of the COVID-19 declaration under Section 564(b)(1) of the Act, 21  U.S.C. section 360bbb-3(b)(1), unless the authorization is terminated or revoked.  Performed at [facility], [place] [address], [place], [place] [address]          Radiology Studies: CT PELVIS WO CONTRAST  Result Date: 08/21/2020 CLINICAL DATA:  Possible fracture seen on hip x-ray after trauma today. Pain. EXAM: CT PELVIS WITHOUT CONTRAST TECHNIQUE: Multidetector CT imaging of the pelvis was performed following the standard protocol without intravenous contrast. COMPARISON:  August 21, 2020 hip x-ray. CT of the pelvis April 27, 2020 FINDINGS: Urinary Tract:  No abnormality visualized. Bowel:  Unremarkable visualized pelvic bowel loops. Vascular/Lymphatic: Calcified atherosclerosis in the distal the abdominal aorta, iliac vessels, and femoral vessels. No adenopathy. Reproductive:  Prostate enlargement. Other:  No free air free fluid. Musculoskeletal: The patient is status post repair of a previous right hip fracture. Hardware is in stable position. The right hip is stable. There is a mildly displaced left inferior pubic ramus fracture. There is a fracture through the anteromedial aspect of the left acetabulum as seen on coronal image 17, axial image 82 and sagittal image 84. No other fractures are identified. IMPRESSION: 1. There is an acute comminuted fracture through the left inferior pubic ramus. 2. There is a subtle fracture through the anteromedial aspect of the left acetabulum as above. 3. No other acute abnormalities. Electronically Signed   By: Dorise Bullion III M.D   On: 08/21/2020 16:40   DG Hip Unilat With Pelvis 2-3 Views Left  Result Date: 08/21/2020 CLINICAL DATA:  Fall EXAM: DG HIP (WITH OR WITHOUT PELVIS) 2-3V LEFT COMPARISON:  None. FINDINGS: Fracture of the inferior left pubic ramus is suspected. There is alignment at the left hip. No acute left hip fracture. Mild changes of osteoarthritis. IMPRESSION: Suspected acute fracture of the inferior left pubic ramus.  Electronically Signed   By: Macy Mis M.D.   On: 08/21/2020 13:44        Scheduled Meds: . amLODipine  10 mg Oral Daily  . buPROPion  150 mg Oral BID  . carvedilol  6.25 mg Oral BID WC  . cycloSPORINE  1 drop Both Eyes BID  . dutasteride  0.5 mg Oral Daily  . irbesartan  75 mg Oral Daily  . mirtazapine  15 mg Oral QHS  . potassium chloride  40 mEq Oral Once  . rivaroxaban  20 mg Oral Q supper  . tamsulosin  0.4 mg Oral QPM   Continuous Infusions:   LOS: 1 day    Time spent: 25 mins.More than 50% of that time was spent in counseling and/or coordination of care.      Shelly Coss, MD [facility] P12/12/2019, 8:56 AM

## 2020-08-22 NOTE — ED Notes (Signed)
Cheese and crackers / sprite given to patient

## 2020-08-22 NOTE — ED Notes (Signed)
Pt reports that Codeine and hydrocodone causes severe abd cramps. MD aware

## 2020-08-22 NOTE — ED Notes (Signed)
Patient repositioned

## 2020-08-22 NOTE — ED Notes (Signed)
Pt provided with breakfast tray at this time.

## 2020-08-22 NOTE — ED Notes (Signed)
Attempted to call report. RN unable to take report at this time. Inpatient RN to call back

## 2020-08-22 NOTE — Evaluation (Signed)
Physical Therapy Evaluation Patient Details Name: Jared [name] MRN: [id number] DOB: 02/07/1941 Today's Date: 08/22/2020   History of Present Illness  79 yo male with history of ground level fall this afternoon when he lost his balance, falling on hard ground. Left superior and inferior pubic ramus fractures with extension into the left anterior acetabulum. No displacement. PMH includes R TKA and R hip fx in 2020.  Clinical Impression  Pt admitted with above diagnosis.  Pt currently with functional limitations due to the deficits listed below (see PT Problem List). Pt will benefit from skilled PT to increase their independence and safety with mobility to allow discharge to the venue listed below.  Pt lives alone and is NWB for 6 weeks on L LE.  He will need SNF for rehab.  Pt was seen in the ED for his evaluation and pain was a limiting factor.  Anticipate that when pain is better control he will do well.       Follow Up Recommendations SNF;Other (comment) (requesting [facility] as he has been there before)    Equipment Recommendations  None recommended by PT    Recommendations for Other Services       Precautions / Restrictions Precautions Precautions: Fall Restrictions Weight Bearing Restrictions: Yes LLE Weight Bearing: Non weight bearing      Mobility  Bed Mobility Overal bed mobility: Needs Assistance Bed Mobility: Rolling Rolling: Max assist         General bed mobility comments: Any movement caused extreme pain in L LE.  Unable to get to EOB.    Transfers                 General transfer comment: NT  Ambulation/Gait                Stairs            Wheelchair Mobility    Modified Rankin (Stroke Patients Only)       Balance                                             Pertinent Vitals/Pain Pain Assessment: Faces Faces Pain Scale: Hurts worst Pain Location: L LE with any movement Pain Descriptors / Indicators:  Grimacing;Guarding;Spasm;Sharp Pain Intervention(s): Limited activity within patient's tolerance;Repositioned;Patient requesting pain meds-RN notified;Utilized relaxation techniques    Home Living Family/patient expects to be discharged to:: Inpatient rehab Living Arrangements: Alone                    Prior Function Level of Independence: Independent with assistive device(s)         Comments: Amb with cane     Hand Dominance        Extremity/Trunk Assessment   Upper Extremity Assessment Upper Extremity Assessment: Defer to OT evaluation    Lower Extremity Assessment Lower Extremity Assessment: LLE deficits/detail;RLE deficits/detail RLE Deficits / Details: hip ROM 75% LLE Deficits / Details: limited by pain LLE: Unable to fully assess due to immobilization       Communication   Communication: No difficulties  Cognition Arousal/Alertness: Awake/alert Behavior During Therapy: WFL for tasks assessed/performed Overall Cognitive Status: Within Functional Limits for tasks assessed  General Comments      Exercises     Assessment/Plan    PT Assessment Patient needs continued PT services  PT Problem List Decreased strength;Decreased range of motion;Decreased activity tolerance;Decreased balance;Decreased mobility;Pain;Decreased coordination;Decreased knowledge of use of DME;Decreased knowledge of precautions       PT Treatment Interventions DME instruction;Functional mobility training;Therapeutic activities;Therapeutic exercise;Balance training    PT Goals (Current goals can be found in the Care Plan section)  Acute Rehab PT Goals Patient Stated Goal: To go to rehab, preferably [facility] as he has been there before. PT Goal Formulation: With patient Time For Goal Achievement: 09/05/20 Potential to Achieve Goals: Fair    Frequency Min 2X/week   Barriers to discharge Decreased caregiver support       Co-evaluation               AM-PAC PT "6 Clicks" Mobility  Outcome Measure Help needed turning from your back to your side while in a flat bed without using bedrails?: Total Help needed moving from lying on your back to sitting on the side of a flat bed without using bedrails?: Total Help needed moving to and from a bed to a chair (including a wheelchair)?: Total Help needed standing up from a chair using your arms (e.g., wheelchair or bedside chair)?: Total Help needed to walk in hospital room?: Total Help needed climbing 3-5 steps with a railing? : Total 6 Click Score: 6    End of Session   Activity Tolerance: Patient limited by pain Patient left: in bed;with call bell/phone within reach Nurse Communication: Patient requests pain meds PT Visit Diagnosis: Unsteadiness on feet (R26.81);History of falling (Z91.81);Difficulty in walking, not elsewhere classified (R26.2);Pain Pain - Right/Left: Left Pain - part of body: Hip    Time: 9826-4158 PT Time Calculation (min) (ACUTE ONLY): 20 min   Charges:   PT Evaluation $PT Eval Low Complexity: 1 Low          Alysson Geist L. Tamala Julian, [place] Pager [phone] 08/22/2020   Galen Manila 08/22/2020, 2:47 PM

## 2020-08-22 NOTE — ED Notes (Signed)
Lunch tray delivered.

## 2020-08-22 NOTE — ED Notes (Signed)
Patient received breakfast tray 

## 2020-08-23 MED ORDER — SENNOSIDES-DOCUSATE SODIUM 8.6-50 MG PO TABS
1.0000 | ORAL_TABLET | Freq: Two times a day (BID) | ORAL | Status: DC
  Administered 2020-08-23 – 2020-08-25 (×5): 1 via ORAL
  Filled 2020-08-23 (×5): qty 1

## 2020-08-23 MED ORDER — ENSURE ENLIVE PO LIQD
237.0000 mL | Freq: Two times a day (BID) | ORAL | Status: DC
  Administered 2020-08-23 – 2020-08-25 (×5): 237 mL via ORAL

## 2020-08-23 MED ORDER — POTASSIUM CHLORIDE CRYS ER 20 MEQ PO TBCR
40.0000 meq | EXTENDED_RELEASE_TABLET | Freq: Once | ORAL | Status: AC
  Administered 2020-08-23: 40 meq via ORAL
  Filled 2020-08-23: qty 2

## 2020-08-23 NOTE — Evaluation (Signed)
Occupational Therapy Evaluation Patient Details Name: Jared [name] MRN: [id number] DOB: [birth year] Today's Date: 08/23/2020    History of Present Illness 79 yo male with history of ground level fall this afternoon when he lost his balance, falling on hard ground. Left superior and inferior pubic ramus fractures with extension into the left anterior acetabulum. No displacement. PMH includes R TKA and R hip fx in 2020.   Clinical Impression   Jared [name] is a 79 year old man who normally lives alone and independent s/p fall and now non weight bearing on LLE due to pubic ramus fracture. On evaluation patient demonstrates functional strength of upper extremities though limited endurance, decreased ROM and strength of LLE, pain with all LLE movement, and impaired balance resulting in difficulty with transfers, ambulation and ADLs. Patient max assist to transfer to side of bed, min assist to stand from high perched position on elevated bed with RW and min assist to transfer to recliner. Patient required instruction and education on use of walker, positioning and activities to perform to maintain strength and ROM. Patient requiring set up and seated position for UB ADLs and max-total assist for LB ADLs and toileting. Patient will benefit from skilled OT services while in hospital to improve deficits and learn compensatory strategies as needed in order to return PLOF. Recommend short term rehab at discharge.      Follow Up Recommendations  SNF    Equipment Recommendations  None recommended by OT    Recommendations for Other Services       Precautions / Restrictions Precautions Precautions: Fall Restrictions Weight Bearing Restrictions: Yes LLE Weight Bearing: Non weight bearing      Mobility Bed Mobility Overal bed mobility: Needs Assistance Bed Mobility: Supine to Sit     Supine to sit: Max assist;+2 for safety/equipment     General bed mobility comments: Assistance for both LEs,  pulling up trunk and use of pad to pivot hips to side of bed.    Transfers Overall transfer level: Needs assistance Equipment used: Rolling walker (2 wheeled) Transfers: Sit to/from [facility] Sit to Stand: Min assist;From elevated surface Stand pivot transfers: Min assist       General transfer comment: Min assist from high perch sitting position to stand with use of RW. Min assist to take steps using RW (patient able to maintain wb status) to transfer to recliner. Verbal cues for technique and hand positioning.    Balance Overall balance assessment: Needs assistance;History of Falls Sitting-balance support: No upper extremity supported;Feet supported Sitting balance-Leahy Scale: Good     Standing balance support: Bilateral upper extremity supported;During functional activity Standing balance-Leahy Scale: Poor                             ADL either performed or assessed with clinical judgement   ADL Overall ADL's : Needs assistance/impaired Eating/Feeding: Independent   Grooming: Set up;Sitting   Upper Body Bathing: Set up;Sitting   Lower Body Bathing: Maximal assistance;Sit to/from stand   Upper Body Dressing : Set up;Sitting   Lower Body Dressing: Sit to/from stand;Total assistance   Toilet Transfer: Minimal assistance;+2 for safety/equipment;BSC;Stand-pivot;RW   Toileting- Clothing Manipulation and Hygiene: Maximal assistance;Sit to/from stand               Vision Patient Visual Report: No change from baseline       Perception     Praxis  Pertinent Vitals/Pain Pain Assessment: 0-10 Pain Score: 10-Worst pain ever Pain Location: L LE with any movement Pain Descriptors / Indicators: Grimacing;Guarding;Spasm;Sharp Pain Intervention(s): Premedicated before session;Monitored during session     Hand Dominance Right   Extremity/Trunk Assessment Upper Extremity Assessment Upper Extremity Assessment: Overall WFL for tasks  assessed   Lower Extremity Assessment Lower Extremity Assessment: Defer to PT evaluation       Communication Communication Communication: No difficulties   Cognition Arousal/Alertness: Awake/alert Behavior During Therapy: WFL for tasks assessed/performed Overall Cognitive Status: Within Functional Limits for tasks assessed                                     General Comments       Exercises     Shoulder Instructions      Home Living Family/patient expects to be discharged to:: Skilled nursing facility Living Arrangements: Alone                                      Prior Functioning/Environment Level of Independence: Independent with assistive device(s)        Comments: Amb with cane, independent for ADLs and IADLs. Reports out walking dog and falling near the door on return home.        OT Problem List: Decreased strength;Decreased range of motion;Decreased activity tolerance;Impaired balance (sitting and/or standing);Decreased knowledge of use of DME or AE;Decreased knowledge of precautions;Pain      OT Treatment/Interventions: Self-care/ADL training;Therapeutic exercise;DME and/or AE instruction;Patient/family education;Balance training;Therapeutic activities    OT Goals(Current goals can be found in the care plan section) Acute Rehab OT Goals Patient Stated Goal: To go to rehab, preferably [facility] as he has been there before. OT Goal Formulation: With patient Time For Goal Achievement: 09/06/20 Potential to Achieve Goals: Good  OT Frequency: Min 2X/week   Barriers to D/C: Decreased caregiver support          Co-evaluation              AM-PAC OT "6 Clicks" Daily Activity     Outcome Measure Help from another person eating meals?: None Help from another person taking care of personal grooming?: A Little Help from another person toileting, which includes using toliet, bedpan, or urinal?: A Lot Help from another  person bathing (including washing, rinsing, drying)?: A Lot Help from another person to put on and taking off regular upper body clothing?: A Little Help from another person to put on and taking off regular lower body clothing?: Total 6 Click Score: 15   End of Session Equipment Utilized During Treatment: Gait belt;Rolling walker Nurse Communication: Mobility status  Activity Tolerance: Patient limited by pain Patient left: in chair;with call bell/phone within reach;with chair alarm set  OT Visit Diagnosis: Other abnormalities of gait and mobility (R26.89);Unsteadiness on feet (R26.81);Muscle weakness (generalized) (M62.81);History of falling (Z91.81);Pain Pain - Right/Left: Left Pain - part of body: Hip (pelvis)                Time: 9563-8756 OT Time Calculation (min): 25 min Charges:  OT General Charges $OT Visit: 1 Visit OT Evaluation $OT Eval Moderate Complexity: 1 Mod OT Treatments $Therapeutic Activity: 8-22 mins  Vonda, OTR/L [place] [phone] Pager: [place] 08/23/2020, 12:22 PM

## 2020-08-23 NOTE — Plan of Care (Signed)
  Problem: Education: Goal: Verbalization of understanding the information provided (i.e., activity precautions, restrictions, etc) will improve 08/23/2020 2248 by Darrick Huntsman, RN Outcome: Progressing 08/23/2020 2247 by Darrick Huntsman, RN Outcome: Progressing   Problem: Activity: Goal: Ability to ambulate and perform ADLs will improve 08/23/2020 2248 by Darrick Huntsman, RN Outcome: Progressing 08/23/2020 2247 by Darrick Huntsman, RN Outcome: Progressing   Problem: Self-Concept: Goal: Ability to maintain and perform role responsibilities to the fullest extent possible will improve 08/23/2020 2248 by Darrick Huntsman, RN Outcome: Progressing 08/23/2020 2247 by Darrick Huntsman, RN Outcome: Progressing   Problem: Pain Management: Goal: Pain level will decrease 08/23/2020 2248 by Darrick Huntsman, RN Outcome: Progressing 08/23/2020 2247 by Darrick Huntsman, RN Outcome: Progressing

## 2020-08-23 NOTE — Progress Notes (Signed)
Initial Nutrition Assessment  RD working remotely.  DOCUMENTATION CODES:   Not applicable  INTERVENTION:  - will order Ensure Enlive BID, each supplement provides 350 kcal and 20 grams of protein. - complete NFPE at follow-up.    NUTRITION DIAGNOSIS:   Increased nutrient needs related to acute illness as evidenced by estimated needs.  GOAL:   Patient will meet greater than or equal to 90% of their needs  MONITOR:   PO intake, Supplement acceptance, Labs, Weight trends  REASON FOR ASSESSMENT:   Malnutrition Screening Tool, Consult Hip fracture protocol  ASSESSMENT:   79 y.o. male with medical history of afib on Xarelto, hx of cryptogenic stroke, anxiety, depression, and BPH. He presented to the ED after a fall in which he lost his balance while taking his dog inside after a wall. He experienced immediate tailbone and L hip pain and was unable to stand or bear weight on his own. He did not hit his head or lose consciousness. Patient lives alone and normally ambulates with the use of cane at all times however did not have a cane with him at time of his fall.  He consumed 100% of breakfast this AM (633 kcal, 27 grams protein). Patient denies any recent changes in appetite or ability to obtain and prepare meals.   He believes he has lost weight over the past 1 year but is unsure how much. Weight yesterday was 195 lb and weight over the past 11 months has been mainly stable with the highest weight being on 3/26 when he weighed 202 lb. This indicates 7 lb weight loss in 8.5 months.  Per notes: - plan for non-surgical management of L pubic ramus fracture   Labs reviewed; K: 3.3 mmol/l, Ca: 8.4 mg/dl. Medications reviewed; 17 g miralax/day, 40 mEq Klor-Con x1 dose 12/5, 50000 units drisdol every 7 days starting 12/4.     NUTRITION - FOCUSED PHYSICAL EXAM:  unable to complete at this time.   Diet Order:   Diet Order            Diet Heart Room service appropriate? Yes; Fluid  consistency: Thin  Diet effective now                 EDUCATION NEEDS:   No education needs have been identified at this time  Skin:  Skin Assessment: Reviewed RN Assessment  Last BM:  PTA/unknown  Height:   Ht Readings from Last 1 Encounters:  08/22/20 5\' 10"  (1.778 m)    Weight:   Wt Readings from Last 1 Encounters:  08/22/20 88.4 kg     Estimated Nutritional Needs:  Kcal:  1750-1950 kcal Protein:  80-95 grams Fluid:  >/= 1.8 L/day      Jarome Matin, MS, RD, LDN, CNSC Inpatient Clinical Dietitian RD pager # available in [facility]  After hours/weekend pager # available in [facility]

## 2020-08-23 NOTE — Progress Notes (Signed)
PROGRESS NOTE    Jared [name]  HUT:[id number] DOB: 1941-05-12 DOA: 08/21/2020 PCP: [name], MD   Chief Complain: Fall, left hip pain  Brief Narrative: Patient is a 79 year old male with history of proximal A. fib on Xarelto, history of cryptogenic stroke, anxiety/depression, BPH who presents to the emergency department for evaluation of a fall at friend's home.  On presentation he was hemodynamically stable.  Left hip x-ray showed acute fracture of the inferior left pubic ramus.  CT pelvis without contrast showed acute comminuted fracture through the left inferior pubic ramus and septal fractures of the anteromedial aspect of the left acetabulum.  Orthopedics consulted who recommended nonoperative management and nonweightbearing of the left lower extremity for 6 weeks.  PT/OT consulted and recommended skilled services at discharge.  Patient is medically stable for discharge to skilled nursing facility as soon as bed is available  Assessment & Plan:   Principal Problem:   Closed fracture of left inferior pubic ramus (HCC) Active Problems:   Essential hypertension   BPH (benign prostatic hyperplasia)   Paroxysmal atrial fibrillation (HCC)   History of CVA (cerebrovascular accident)   Depression with anxiety   Acute comminuted fracture of the left inferior pubic ramus: Imagings as above.  Fell at home.  Usually ambulatory  at home,uses cane.  Orthopedics consulted and recommended nonoperative management.  Recommended nonweightbearing of the left lower extremity for 6 weeks.  PT/OT consulted and recommended SNF.  Continue supportive care, pain management  Paroxysmal A. fib: Currently in normal sinus rhythm.  On Coreg for rate control.  On Xarelto for anticoagulation  History of cryptogenic stroke: Continue Xarelto  Hypertension: Continue current medications: Amlodipine, Coreg and valsartan.  Monitor blood pressure  BPH: On dutasteride and tamsulosin  Depression/anxiety: On  mirtazapine, bupropion and as needed Xanax.  Nutrition Problem: Increased nutrient needs Etiology: acute illness      DVT prophylaxis: Xarelto Code Status: DNR Family Communication: Called daughter, phone not received Status is: Inpatient  Remains inpatient appropriate because:Unsafe d/c plan   Dispo: The patient is from: Home              Anticipated d/c is to:  SNF              Anticipated d/c date is: As soon as bed is available              Patient currently is medically stable for discharge     Consultants: Orthopedics  Procedures:None  Antimicrobials:  Anti-infectives (From admission, onward)   None      Subjective: Patient seen and examined the bedside this morning.  Hemodynamically stable.  Comfortable.  Denies any complaints.  Pain well controlled  Objective: Vitals:   08/22/20 2103 08/23/20 0003 08/23/20 0438 08/23/20 0931  BP: (!) 169/87 (!) 153/78 (!) 176/86 (!) 149/74  Pulse: 64 (!) 57 60 60  Resp: 20 20 19 16   Temp: 99.3 F (37.4 C) 97.9 F (36.6 C) 98.1 F (36.7 C) 98.1 F (36.7 C)  TempSrc: Oral Oral Oral Oral  SpO2: 98% 96% 98% 94%  Weight:      Height:        Intake/Output Summary (Last 24 hours) at 08/23/2020 1121 Last data filed at 08/23/2020 0840 Gross per 24 hour  Intake 240 ml  Output --  Net 240 ml   Filed Weights   08/22/20 1836  Weight: 88.4 kg    Examination:  General exam: Appears calm and comfortable ,Not in distress,average built  HEENT:PERRL,Oral mucosa moist, Ear/Nose normal on gross exam Respiratory system: Bilateral equal air entry, normal vesicular breath sounds, no wheezes or crackles  Cardiovascular system: S1 & S2 heard, RRR. No JVD, murmurs, rubs, gallops or clicks. Gastrointestinal system: Abdomen is nondistended, soft and nontender. No organomegaly or masses felt. Normal bowel sounds heard. Central nervous system: Alert and oriented. No focal neurological deficits. Extremities: No edema, no clubbing ,no  cyanosis, distal peripheral pulses palpable. Skin: No rashes, lesions or ulcers,no icterus ,no pallor    Data Reviewed: I have personally reviewed following labs and imaging studies  CBC: Recent Labs  Lab 08/21/20 1525 08/22/20 0320  WBC 11.9* 7.6  NEUTROABS 9.2*  --   HGB 14.5 12.6*  HCT 42.6 37.1*  MCV 91.2 91.2  PLT 178 035*   Basic Metabolic Panel: Recent Labs  Lab 08/21/20 1525 08/22/20 0320  NA 140 138  K 3.6 3.3*  CL 105 105  CO2 23 25  GLUCOSE 124* 112*  BUN 17 18  CREATININE 1.06 1.12  CALCIUM 9.0 8.4*   GFR: Estimated Creatinine Clearance: 61.9 mL/min (by C-G formula based on SCr of 1.12 mg/dL). Liver Function Tests: No results for input(s): AST, ALT, ALKPHOS, BILITOT, PROT, ALBUMIN in the last 168 hours. No results for input(s): LIPASE, AMYLASE in the last 168 hours. No results for input(s): AMMONIA in the last 168 hours. Coagulation Profile: No results for input(s): INR, PROTIME in the last 168 hours. Cardiac Enzymes: No results for input(s): CKTOTAL, CKMB, CKMBINDEX, TROPONINI in the last 168 hours. BNP (last 3 results) No results for input(s): PROBNP in the last 8760 hours. HbA1C: No results for input(s): HGBA1C in the last 72 hours. CBG: No results for input(s): GLUCAP in the last 168 hours. Lipid Profile: No results for input(s): CHOL, HDL, LDLCALC, TRIG, CHOLHDL, LDLDIRECT in the last 72 hours. Thyroid Function Tests: No results for input(s): TSH, T4TOTAL, FREET4, T3FREE, THYROIDAB in the last 72 hours. Anemia Panel: No results for input(s): VITAMINB12, FOLATE, FERRITIN, TIBC, IRON, RETICCTPCT in the last 72 hours. Sepsis Labs: No results for input(s): PROCALCITON, LATICACIDVEN in the last 168 hours.  Recent Results (from the past 240 hour(s))  Resp Panel by RT-PCR (Flu A&B, Covid) Nasopharyngeal Swab     Status: None   Collection Time: 08/21/20  3:26 PM   Specimen: Nasopharyngeal Swab; Nasopharyngeal(NP) swabs in vial transport medium   Result Value Ref Range Status   SARS Coronavirus 2 by RT PCR NEGATIVE NEGATIVE Final    Comment: (NOTE) SARS-CoV-2 target nucleic acids are NOT DETECTED.  The SARS-CoV-2 RNA is generally detectable in upper respiratory specimens during the acute phase of infection. The lowest concentration of SARS-CoV-2 viral copies this assay can detect is 138 copies/mL. A negative result does not preclude SARS-Cov-2 infection and should not be used as the sole basis for treatment or other patient management decisions. A negative result may occur with  improper specimen collection/handling, submission of specimen other than nasopharyngeal swab, presence of viral mutation(s) within the areas targeted by this assay, and inadequate number of viral copies(<138 copies/mL). A negative result must be combined with clinical observations, patient history, and epidemiological information. The expected result is Negative.  Fact Sheet for Patients:  EntrepreneurPulse.com.au  Fact Sheet for Healthcare Providers:  IncredibleEmployment.be  This test is no t yet approved or cleared by the [place] FDA and  has been authorized for detection and/or diagnosis of SARS-CoV-2 by FDA under an Emergency Use Authorization (EUA). This EUA will remain  in effect (meaning this test can be used) for the duration of the COVID-19 declaration under Section 564(b)(1) of the Act, 21 U.S.C.section 360bbb-3(b)(1), unless the authorization is terminated  or revoked sooner.       Influenza A by PCR NEGATIVE NEGATIVE Final   Influenza B by PCR NEGATIVE NEGATIVE Final    Comment: (NOTE) The Xpert Xpress SARS-CoV-2/FLU/RSV plus assay is intended as an aid in the diagnosis of influenza from Nasopharyngeal swab specimens and should not be used as a sole basis for treatment. Nasal washings and aspirates are unacceptable for Xpert Xpress SARS-CoV-2/FLU/RSV testing.  Fact Sheet for  Patients: EntrepreneurPulse.com.au  Fact Sheet for Healthcare Providers: IncredibleEmployment.be  This test is not yet approved or cleared by the [place] FDA and has been authorized for detection and/or diagnosis of SARS-CoV-2 by FDA under an Emergency Use Authorization (EUA). This EUA will remain in effect (meaning this test can be used) for the duration of the COVID-19 declaration under Section 564(b)(1) of the Act, 21 U.S.C. section 360bbb-3(b)(1), unless the authorization is terminated or revoked.  Performed at [facility] [place], [place] [address] [place], [place] [address]          Radiology Studies: CT PELVIS WO CONTRAST  Result Date: 08/21/2020 CLINICAL DATA:  Possible fracture seen on hip x-ray after trauma today. Pain. EXAM: CT PELVIS WITHOUT CONTRAST TECHNIQUE: Multidetector CT imaging of the pelvis was performed following the standard protocol without intravenous contrast. COMPARISON:  August 21, 2020 hip x-ray. CT of the pelvis April 27, 2020 FINDINGS: Urinary Tract:  No abnormality visualized. Bowel:  Unremarkable visualized pelvic bowel loops. Vascular/Lymphatic: Calcified atherosclerosis in the distal the abdominal aorta, iliac vessels, and femoral vessels. No adenopathy. Reproductive:  Prostate enlargement. Other:  No free air free fluid. Musculoskeletal: The patient is status post repair of a previous right hip fracture. Hardware is in stable position. The right hip is stable. There is a mildly displaced left inferior pubic ramus fracture. There is a fracture through the anteromedial aspect of the left acetabulum as seen on coronal image 17, axial image 82 and sagittal image 84. No other fractures are identified. IMPRESSION: 1. There is an acute comminuted fracture through the left inferior pubic ramus. 2. There is a subtle fracture through the anteromedial aspect of the left acetabulum as above. 3. No other acute  abnormalities. Electronically Signed   By: Dorise Bullion III M.D   On: 08/21/2020 16:40   DG Hip Unilat With Pelvis 2-3 Views Left  Result Date: 08/21/2020 CLINICAL DATA:  Fall EXAM: DG HIP (WITH OR WITHOUT PELVIS) 2-3V LEFT COMPARISON:  None. FINDINGS: Fracture of the inferior left pubic ramus is suspected. There is alignment at the left hip. No acute left hip fracture. Mild changes of osteoarthritis. IMPRESSION: Suspected acute fracture of the inferior left pubic ramus. Electronically Signed   By: Macy Mis M.D.   On: 08/21/2020 13:44        Scheduled Meds: . amLODipine  10 mg Oral Daily  . buPROPion  150 mg Oral BID  . carvedilol  6.25 mg Oral BID WC  . cycloSPORINE  1 drop Both Eyes BID  . dutasteride  0.5 mg Oral Daily  . feeding supplement  237 mL Oral BID BM  . irbesartan  75 mg Oral Daily  . mirtazapine  15 mg Oral QHS  . polyethylene glycol  17 g Oral Daily  . rivaroxaban  20 mg Oral Q supper  . tamsulosin  0.4 mg  Oral QPM  . Vitamin D (Ergocalciferol)  50,000 Units Oral Q7 days   Continuous Infusions:   LOS: 2 days    Time spent: 25 mins.More than 50% of that time was spent in counseling and/or coordination of care.      Shelly Coss, MD [facility] P12/01/2020, 11:21 AM

## 2020-08-24 ENCOUNTER — Other Ambulatory Visit: Payer: Self-pay | Admitting: Internal Medicine

## 2020-08-24 LAB — RESP PANEL BY RT-PCR (FLU A&B, COVID) ARPGX2
Influenza A by PCR: NEGATIVE
Influenza B by PCR: NEGATIVE
SARS Coronavirus 2 by RT PCR: NEGATIVE

## 2020-08-24 MED ORDER — ALPRAZOLAM 1 MG PO TABS
0.5000 mg | ORAL_TABLET | Freq: Three times a day (TID) | ORAL | 0 refills | Status: DC | PRN
Start: 2020-08-24 — End: 2020-08-24

## 2020-08-24 MED ORDER — TRAMADOL HCL 50 MG PO TABS: 50.0000 mg | ORAL_TABLET | Freq: Four times a day (QID) | ORAL | 0 refills | Status: AC | PRN

## 2020-08-24 MED ORDER — ALPRAZOLAM 1 MG PO TABS: 0.5000 mg | ORAL_TABLET | Freq: Three times a day (TID) | ORAL | 0 refills | Status: DC | PRN

## 2020-08-24 MED ORDER — ALUM & MAG HYDROXIDE-SIMETH 200-200-20 MG/5ML PO SUSP
30.0000 mL | ORAL | Status: AC
  Administered 2020-08-24: 30 mL via ORAL
  Filled 2020-08-24: qty 30

## 2020-08-24 MED ORDER — VITAMIN D (ERGOCALCIFEROL) 1.25 MG (50000 UNIT) PO CAPS: 50000.0000 [IU] | ORAL_CAPSULE | ORAL | Status: AC

## 2020-08-24 NOTE — TOC Progression Note (Signed)
Transition of Care [facility]) - Progression Note    Patient Details  Name: Jared [name] MRN: [id number] Date of Birth: 07/20/1941  [facility] CM/SW Contact  Purcell Mouton, RN Phone Number: 08/24/2020, 12:53 PM  Clinical Narrative:     Pt selected [facility] for SNF.   Expected Discharge Plan: [place] Barriers to Discharge: No Barriers Identified  Expected Discharge Plan and Services Expected Discharge Plan: [place] arrangements for the past 2 months: Single Family Home Expected Discharge Date: 08/24/20                                     Social Determinants of Health (SDOH) Interventions    Readmission Risk Interventions No flowsheet data found.

## 2020-08-24 NOTE — Discharge Instructions (Signed)
No weight on the left leg. Do exercises as instructed several times per day.   Follow up with Dr Veverly Fells in two weeks in the office.

## 2020-08-24 NOTE — Care Management Important Message (Signed)
Important Message  Patient Details IM Letter given to the Patient. Name: Jared [name] MRN: [id number] Date of Birth: [birth year]   Medicare Important Message Given:  Yes     Kerin Salen 08/24/2020, 2:02 PM

## 2020-08-24 NOTE — TOC Progression Note (Signed)
Transition of Care [facility]) - Progression Note    Patient Details  Name: Jared [name] MRN: [id number] Date of Birth: May 28, 1941  [facility]) CM/SW Contact  Purcell Mouton, RN Phone Number: 08/24/2020, 10:59 AM  Clinical Narrative:    Spoke with pt concerning discharge to SNF. Pt selected [facility] a call was made to Admission Coordinator and [employer]. Facility will need to get authorization from [employer].         Expected Discharge Plan and Services           Expected Discharge Date: 08/24/20                                     Social Determinants of Health (SDOH) Interventions    Readmission Risk Interventions No flowsheet data found.

## 2020-08-24 NOTE — Discharge Summary (Signed)
Physician Discharge Summary  DAJON LAZAR YYQ:[id number] DOB: [birth year] DOA: 08/21/2020  PCP: [name], MD  Admit date: 08/21/2020 Discharge date: 08/24/2020  Admitted From: Home Disposition: SNF  Discharge Condition:Stable CODE STATUS:FULL Diet recommendation: Heart Healthy   Brief/Interim Summary:  Patient is a 79 year old male with history of proximal A. fib on Xarelto, history of cryptogenic stroke, anxiety/depression, BPH who presents to the emergency department for evaluation of a fall at friend's home.  On presentation he was hemodynamically stable.  Left hip x-ray showed acute fracture of the inferior left pubic ramus.  CT pelvis without contrast showed acute comminuted fracture through the left inferior pubic ramus and septal fractures of the anteromedial aspect of the left acetabulum.  Orthopedics consulted who recommended nonoperative management and nonweightbearing of the left lower extremity for 6 weeks.  PT/OT consulted and recommended skilled nursing services at discharge.  Patient is medically stable for discharge to skilled nursing facility.   Following problems were addressed during his hospitalization:  Acute comminuted fracture of the left inferior pubic ramus: Imagings as above.  Fell at home.  Usually ambulatory  at home,uses cane.  Orthopedics consulted and recommended nonoperative management.  Recommended nonweightbearing of the left lower extremity for 6 weeks.  PT/OT consulted and recommended SNF.  Continue supportive care, pain management. Recommend orthopedics  follow-up as an outpatient in 2 weeks  Paroxysmal A. fib: Currently in normal sinus rhythm.  On Coreg for rate control.  On Xarelto for anticoagulation  History of cryptogenic stroke: Continue Xarelto  Hypertension: Continue current medications: Amlodipine, Coreg and valsartan.  Monitor blood pressure  BPH: On dutasteride and tamsulosin  Depression/anxiety: On mirtazapine, bupropion and as  needed Xanax.   Discharge Diagnoses:  Principal Problem:   Closed fracture of left inferior pubic ramus (HCC) Active Problems:   Essential hypertension   BPH (benign prostatic hyperplasia)   Paroxysmal atrial fibrillation (HCC)   History of CVA (cerebrovascular accident)   Depression with anxiety    Discharge Instructions  Discharge Instructions    Diet - low sodium heart healthy   Complete by: As directed    Discharge instructions   Complete by: As directed    1) Please take prescribed medications as instructed 2) Follow-up with orthopedics as an outpatient in 2 weeks.  Name and number of the provider  has been attached   Increase activity slowly   Complete by: As directed      Allergies as of 08/24/2020      Reactions   Antihistamines, Diphenhydramine-type    Blood in urine    Aspirin    Blood in urine    Codeine Nausea And Vomiting   Must take with food   Pollen Extract Cough      Medication List    STOP taking these medications   bisacodyl 5 MG EC tablet Commonly known as: DULCOLAX   omeprazole 20 MG capsule Commonly known as: PRILOSEC     TAKE these medications   acetaminophen 500 MG tablet Commonly known as: TYLENOL Take 500 mg by mouth every 6 (six) hours as needed for moderate pain.   ALPRAZolam 1 MG tablet Commonly known as: XANAX Take 0.5 tablets (0.5 mg total) by mouth 3 (three) times daily as needed for anxiety.   amLODipine 10 MG tablet Commonly known as: NORVASC Take 1 tablet (10 mg total) by mouth daily.   buPROPion 150 MG 12 hr tablet Commonly known as: WELLBUTRIN SR Take 1 tablet (150 mg total) by mouth 2 (  two) times daily.   carvedilol 6.25 MG tablet Commonly known as: COREG TAKE 1 TABLET TWICE DAILY WITH MEALS   docusate sodium 100 MG capsule Commonly known as: COLACE Take 1 capsule (100 mg total) by mouth 2 (two) times daily. What changed:   when to take this  reasons to take this   dutasteride 0.5 MG capsule Commonly  known as: AVODART TAKE 1 CAPSULE BY MOUTH EVERY DAY What changed: how much to take   mirtazapine 15 MG tablet Commonly known as: REMERON TAKE 1 TABLET BY MOUTH EVERYDAY AT BEDTIME What changed:   how much to take  how to take this  when to take this  additional instructions   polyethylene glycol 17 g packet Commonly known as: MIRALAX / GLYCOLAX Take 17 g by mouth 2 (two) times daily. What changed:   when to take this  reasons to take this   Restasis 0.05 % ophthalmic emulsion Generic drug: cycloSPORINE Place 1 drop 2 (two) times daily into both eyes.   rivaroxaban 20 MG Tabs tablet Commonly known as: Xarelto Take 1 tablet (20 mg total) by mouth daily with supper.   Systane Balance 0.6 % Soln Generic drug: Propylene Glycol Place 1 drop into both eyes daily as needed (dry eyes).   tamsulosin 0.4 MG Caps capsule Commonly known as: FLOMAX Take 1 capsule (0.4 mg total) by mouth every evening.   timolol 0.5 % ophthalmic solution Commonly known as: TIMOPTIC Place 1 drop into both eyes 2 (two) times daily as needed (dry eyes).   traMADol 50 MG tablet Commonly known as: ULTRAM Take 1 tablet (50 mg total) by mouth every 6 (six) hours as needed for moderate pain.   TUMS E-X PO Take 2 tablets by mouth 2 (two) times daily as needed (heartburn).   valsartan 80 MG tablet Commonly known as: DIOVAN TAKE 1 TABLET EVERY DAY   Vitamin D (Ergocalciferol) 1.25 MG (50000 UNIT) Caps capsule Commonly known as: DRISDOL Take 1 capsule (50,000 Units total) by mouth every 7 (seven) days. Start taking on: August 29, 2020       Follow-up Information    Viviana Simpler I, MD. Schedule an appointment as soon as possible for a visit in 1 week(s).   Specialties: Internal Medicine, Pediatrics Contact information: [place] [address] [phone]        [name], MD. Schedule an appointment as soon as possible for a visit in 2 week(s).    Specialty: Orthopedic Surgery Contact information: [address] [place] [address] [phone]              Allergies  Allergen Reactions  . Antihistamines, Diphenhydramine-Type     Blood in urine   . Aspirin     Blood in urine   . Codeine Nausea And Vomiting    Must take with food  . Pollen Extract Cough    Consultations:  Ortho   Procedures/Studies: CT PELVIS WO CONTRAST  Result Date: 08/21/2020 CLINICAL DATA:  Possible fracture seen on hip x-ray after trauma today. Pain. EXAM: CT PELVIS WITHOUT CONTRAST TECHNIQUE: Multidetector CT imaging of the pelvis was performed following the standard protocol without intravenous contrast. COMPARISON:  August 21, 2020 hip x-ray. CT of the pelvis April 27, 2020 FINDINGS: Urinary Tract:  No abnormality visualized. Bowel:  Unremarkable visualized pelvic bowel loops. Vascular/Lymphatic: Calcified atherosclerosis in the distal the abdominal aorta, iliac vessels, and femoral vessels. No adenopathy. Reproductive:  Prostate enlargement. Other:  No free air free fluid. Musculoskeletal: The patient is status post repair of a previous right hip fracture. Hardware is in stable position. The right hip is stable. There is a mildly displaced left inferior pubic ramus fracture. There is a fracture through the anteromedial aspect of the left acetabulum as seen on coronal image 17, axial image 82 and sagittal image 84. No other fractures are identified. IMPRESSION: 1. There is an acute comminuted fracture through the left inferior pubic ramus. 2. There is a subtle fracture through the anteromedial aspect of the left acetabulum as above. 3. No other acute abnormalities. Electronically Signed   By: Dorise Bullion III M.D   On: 08/21/2020 16:40   DG Hip Unilat With Pelvis 2-3 Views Left  Result Date: 08/21/2020 CLINICAL DATA:  Fall EXAM: DG HIP (WITH OR WITHOUT PELVIS) 2-3V LEFT COMPARISON:  None. FINDINGS: Fracture of the inferior left  pubic ramus is suspected. There is alignment at the left hip. No acute left hip fracture. Mild changes of osteoarthritis. IMPRESSION: Suspected acute fracture of the inferior left pubic ramus. Electronically Signed   By: Macy Mis M.D.   On: 08/21/2020 13:44       Subjective: Patient seen and examined the bedside this morning.  Hemodynamically stable for discharge today.  Discharge Exam: Vitals:   08/23/20 2118 08/24/20 0509  BP: (!) 155/78 (!) 150/84  Pulse: 67 (!) 55  Resp: 16 16  Temp: 98.5 F (36.9 C) 97.9 F (36.6 C)  SpO2: 95% 96%   Vitals:   08/23/20 0931 08/23/20 1234 08/23/20 2118 08/24/20 0509  BP: (!) 149/74 (!) 151/81 (!) 155/78 (!) 150/84  Pulse: 60 61 67 (!) 55  Resp: 16 16 16 16   Temp: 98.1 F (36.7 C) 97.6 F (36.4 C) 98.5 F (36.9 C) 97.9 F (36.6 C)  TempSrc: Oral Oral Oral Oral  SpO2: 94% 97% 95% 96%  Weight:      Height:        General: Pt is alert, awake, not in acute distress Cardiovascular: RRR, S1/S2 +, no rubs, no gallops Respiratory: CTA bilaterally, no wheezing, no rhonchi Abdominal: Soft, NT, ND, bowel sounds + Extremities: no edema, no cyanosis    The results of significant diagnostics from this hospitalization (including imaging, microbiology, ancillary and laboratory) are listed below for reference.     Microbiology: Recent Results (from the past 240 hour(s))  Resp Panel by RT-PCR (Flu A&B, Covid) Nasopharyngeal Swab     Status: None   Collection Time: 08/21/20  3:26 PM   Specimen: Nasopharyngeal Swab; Nasopharyngeal(NP) swabs in vial transport medium  Result Value Ref Range Status   SARS Coronavirus 2 by RT PCR NEGATIVE NEGATIVE Final    Comment: (NOTE) SARS-CoV-2 target nucleic acids are NOT DETECTED.  The SARS-CoV-2 RNA is generally detectable in upper respiratory specimens during the acute phase of infection. The lowest concentration of SARS-CoV-2 viral copies this assay can detect is 138 copies/mL. A negative result  does not preclude SARS-Cov-2 infection and should not be used as the sole basis for treatment or other patient management decisions. A negative result may occur with  improper specimen collection/handling, submission of specimen other than nasopharyngeal swab, presence of viral mutation(s) within the areas targeted by this assay, and inadequate number of viral copies(<138 copies/mL). A negative result must be combined with clinical observations, patient history, and epidemiological information. The expected result is Negative.  Fact Sheet for Patients:  EntrepreneurPulse.com.au  Fact Sheet for Healthcare Providers:  IncredibleEmployment.be  This test is no t yet approved or cleared by the [place] and  has been authorized for detection and/or diagnosis of SARS-CoV-2 by FDA under an Emergency Use Authorization (EUA). This EUA will remain  in effect (meaning this test can be used) for the duration of the COVID-19 declaration under Section 564(b)(1) of the Act, 21 U.S.C.section 360bbb-3(b)(1), unless the authorization is terminated  or revoked sooner.       Influenza A by PCR NEGATIVE NEGATIVE Final   Influenza B by PCR NEGATIVE NEGATIVE Final    Comment: (NOTE) The Xpert Xpress SARS-CoV-2/FLU/RSV plus assay is intended as an aid in the diagnosis of influenza from Nasopharyngeal swab specimens and should not be used as a sole basis for treatment. Nasal washings and aspirates are unacceptable for Xpert Xpress SARS-CoV-2/FLU/RSV testing.  Fact Sheet for Patients: EntrepreneurPulse.com.au  Fact Sheet for Healthcare Providers: IncredibleEmployment.be  This test is not yet approved or cleared by the [place] FDA and has been authorized for detection and/or diagnosis of SARS-CoV-2 by FDA under an Emergency Use Authorization (EUA). This EUA will remain in effect (meaning this test can be used) for  the duration of the COVID-19 declaration under Section 564(b)(1) of the Act, 21 U.S.C. section 360bbb-3(b)(1), unless the authorization is terminated or revoked.  Performed at [facility], [place] [address] [place], [place] [address]      Labs: BNP (last 3 results) No results for input(s): BNP in the last 8760 hours. Basic Metabolic Panel: Recent Labs  Lab 08/21/20 1525 08/22/20 0320  NA 140 138  K 3.6 3.3*  CL 105 105  CO2 23 25  GLUCOSE 124* 112*  BUN 17 18  CREATININE 1.06 1.12  CALCIUM 9.0 8.4*   Liver Function Tests: No results for input(s): AST, ALT, ALKPHOS, BILITOT, PROT, ALBUMIN in the last 168 hours. No results for input(s): LIPASE, AMYLASE in the last 168 hours. No results for input(s): AMMONIA in the last 168 hours. CBC: Recent Labs  Lab 08/21/20 1525 08/22/20 0320  WBC 11.9* 7.6  NEUTROABS 9.2*  --   HGB 14.5 12.6*  HCT 42.6 37.1*  MCV 91.2 91.2  PLT 178 146*   Cardiac Enzymes: No results for input(s): CKTOTAL, CKMB, CKMBINDEX, TROPONINI in the last 168 hours. BNP: Invalid input(s): POCBNP CBG: No results for input(s): GLUCAP in the last 168 hours. D-Dimer No results for input(s): DDIMER in the last 72 hours. Hgb A1c No results for input(s): HGBA1C in the last 72 hours. Lipid Profile No results for input(s): CHOL, HDL, LDLCALC, TRIG, CHOLHDL, LDLDIRECT in the last 72 hours. Thyroid function studies No results for input(s): TSH, T4TOTAL, T3FREE, THYROIDAB in the last 72 hours.  Invalid input(s): FREET3 Anemia work up No results for input(s): VITAMINB12, FOLATE, FERRITIN, TIBC, IRON, RETICCTPCT in the last 72 hours. Urinalysis    Component Value Date/Time   COLORURINE YELLOW 04/26/2020 2339   APPEARANCEUR CLEAR 04/26/2020 2339   LABSPEC 1.017 04/26/2020 2339   PHURINE 5.0 04/26/2020 2339   GLUCOSEU 50 (A) 04/26/2020 2339   GLUCOSEU NEGATIVE 10/29/2018 0932   HGBUR MODERATE (A) 04/26/2020 2339   BILIRUBINUR NEGATIVE  04/26/2020 2339   BILIRUBINUR Neg 10/28/2019 1728   KETONESUR NEGATIVE 04/26/2020 2339   PROTEINUR 30 (A) 04/26/2020 2339   UROBILINOGEN 0.2 10/28/2019 1728   UROBILINOGEN 0.2 10/29/2018 0932   NITRITE NEGATIVE 04/26/2020 2339   LEUKOCYTESUR SMALL (A) 04/26/2020 2339   Sepsis Labs Invalid input(s): PROCALCITONIN,  WBC,  LACTICIDVEN Microbiology Recent  Results (from the past 240 hour(s))  Resp Panel by RT-PCR (Flu A&B, Covid) Nasopharyngeal Swab     Status: None   Collection Time: 08/21/20  3:26 PM   Specimen: Nasopharyngeal Swab; Nasopharyngeal(NP) swabs in vial transport medium  Result Value Ref Range Status   SARS Coronavirus 2 by RT PCR NEGATIVE NEGATIVE Final    Comment: (NOTE) SARS-CoV-2 target nucleic acids are NOT DETECTED.  The SARS-CoV-2 RNA is generally detectable in upper respiratory specimens during the acute phase of infection. The lowest concentration of SARS-CoV-2 viral copies this assay can detect is 138 copies/mL. A negative result does not preclude SARS-Cov-2 infection and should not be used as the sole basis for treatment or other patient management decisions. A negative result may occur with  improper specimen collection/handling, submission of specimen other than nasopharyngeal swab, presence of viral mutation(s) within the areas targeted by this assay, and inadequate number of viral copies(<138 copies/mL). A negative result must be combined with clinical observations, patient history, and epidemiological information. The expected result is Negative.  Fact Sheet for Patients:  EntrepreneurPulse.com.au  Fact Sheet for Healthcare Providers:  IncredibleEmployment.be  This test is no t yet approved or cleared by the [place] FDA and  has been authorized for detection and/or diagnosis of SARS-CoV-2 by FDA under an Emergency Use Authorization (EUA). This EUA will remain  in effect (meaning this test can be used) for the  duration of the COVID-19 declaration under Section 564(b)(1) of the Act, 21 U.S.C.section 360bbb-3(b)(1), unless the authorization is terminated  or revoked sooner.       Influenza A by PCR NEGATIVE NEGATIVE Final   Influenza B by PCR NEGATIVE NEGATIVE Final    Comment: (NOTE) The Xpert Xpress SARS-CoV-2/FLU/RSV plus assay is intended as an aid in the diagnosis of influenza from Nasopharyngeal swab specimens and should not be used as a sole basis for treatment. Nasal washings and aspirates are unacceptable for Xpert Xpress SARS-CoV-2/FLU/RSV testing.  Fact Sheet for Patients: EntrepreneurPulse.com.au  Fact Sheet for Healthcare Providers: IncredibleEmployment.be  This test is not yet approved or cleared by the [place] FDA and has been authorized for detection and/or diagnosis of SARS-CoV-2 by FDA under an Emergency Use Authorization (EUA). This EUA will remain in effect (meaning this test can be used) for the duration of the COVID-19 declaration under Section 564(b)(1) of the Act, 21 U.S.C. section 360bbb-3(b)(1), unless the authorization is terminated or revoked.  Performed at [facility], [place] [address] [place], [place] [address]     Please note: You were cared for by a hospitalist during your hospital stay. Once you are discharged, your primary care physician will handle any further medical issues. Please note that NO REFILLS for any discharge medications will be authorized once you are discharged, as it is imperative that you return to your primary care physician (or establish a relationship with a primary care physician if you do not have one) for your post hospital discharge needs so that they can reassess your need for medications and monitor your lab values.    Time coordinating discharge: 40 minutes  SIGNED:   Shelly Coss, MD  [facility] 08/24/2020, 10:51 AM Pager [phone]  If 7PM-7AM,  please contact night-coverage [email or web address] Password [email or web address]

## 2020-08-24 NOTE — Plan of Care (Signed)
  Problem: Education: Goal: Verbalization of understanding the information provided (i.e., activity precautions, restrictions, etc) will improve Outcome: Progressing   Problem: Activity: Goal: Ability to ambulate and perform ADLs will improve Outcome: Progressing   Problem: Self-Concept: Goal: Ability to maintain and perform role responsibilities to the fullest extent possible will improve Outcome: Progressing   Problem: Pain Management: Goal: Pain level will decrease Outcome: Progressing   

## 2020-08-24 NOTE — Progress Notes (Signed)
Orthopedics Progress Note  Subjective: Patient says he is comfortable as long as he is not moving the leg much.   Objective:  Vitals:   08/24/20 0509 08/24/20 1237  BP: (!) 150/84 (!) 144/70  Pulse: (!) 55 67  Resp: 16 (!) 22  Temp: 97.9 F (36.6 C) 98.5 F (36.9 C)  SpO2: 96% 95%    General: Awake and alert  Musculoskeletal: Left leg with mild pain with active IR/ER while supine in bed. No pain with passive log roll left leg. Leg lengths symmetric. No pain with active ankle pumps Neurovascularly intact  Lab Results  Component Value Date   WBC 7.6 08/22/2020   HGB 12.6 (L) 08/22/2020   HCT 37.1 (L) 08/22/2020   MCV 91.2 08/22/2020   PLT 146 (L) 08/22/2020       Component Value Date/Time   NA 138 08/22/2020 0320   NA 141 08/12/2019 1023   K 3.3 (L) 08/22/2020 0320   CL 105 08/22/2020 0320   CO2 25 08/22/2020 0320   GLUCOSE 112 (H) 08/22/2020 0320   BUN 18 08/22/2020 0320   BUN 20 08/12/2019 1023   CREATININE 1.12 08/22/2020 0320   CALCIUM 8.4 (L) 08/22/2020 0320   GFRNONAA >60 08/22/2020 0320   GFRAA >60 04/28/2020 0049    Lab Results  Component Value Date   INR 1.1 12/17/2019   INR 2.0 (H) 02/26/2019   INR 1.03 07/26/2017    Assessment/Plan: Left superior pubic ramus fracture with extension into the anterior acetabulum. No displacement.  Continue NWB left LE Follow up with me in the office in two weeks Agree with short term SNF placement  Remo Lipps R. Veverly Fells, MD 08/24/2020 6:18 PM

## 2020-08-24 NOTE — NC FL2 (Signed)
[place] LEVEL OF CARE SCREENING TOOL     IDENTIFICATION  Patient Name: Jared [name] Birthdate: 04/09/1941 Sex: male Admission Date (Current Location): 08/21/2020  [facility] and [place] Number:  Herbalist and Address:  [facility],  [place] [address] [place]      Provider Number: [id number]  Attending Physician Name and Address:  Shelly Coss, MD  Relative Name and Phone Number:  Robley Fries [phone]    Current Level of Care: Hospital Recommended Level of Care: [place] Prior Approval Number:    Date Approved/Denied:   PASRR Number: [id number]  Discharge Plan: SNF    Current Diagnoses: Patient Active Problem List   Diagnosis Date Noted  . Closed fracture of left inferior pubic ramus ([place]) 08/21/2020  . History of CVA (cerebrovascular accident) 08/21/2020  . Depression with anxiety 08/21/2020  . Atherosclerosis of aorta ([place]) 06/18/2020  . Perirectal abscess 04/27/2020  . Class 1 obesity due to excess calories with body mass index (BMI) of 30.0 to 30.9 in adult 04/27/2020  . Renal calculus, left 12/17/2019  . LLQ pain 08/19/2019  . Preventative health care 06/17/2019  . Advance directive discussed with patient 06/17/2019  . Status post total knee replacement, left 02/22/2019  . Swelling of right foot 01/04/2019  . Osteoarthritis of right knee 06/20/2018  . Paroxysmal atrial fibrillation (HCC)   . MDD (major depressive disorder), recurrent, in partial remission ([place])   . Benzodiazepine dependence ([place])   . Ejection fraction < 50% 11/07/2017  . Frequent PVCs 07/27/2017  . Diverticulosis of colon without hemorrhage 02/06/2013  . Right inguinal hernia 10/12/2012  . BPH (benign prostatic hyperplasia) 04/08/2009  . OSA (obstructive sleep apnea) 10/02/2008  . Essential hypertension 10/05/2007  . Allergic rhinitis 10/05/2007  . GERD 10/05/2007  . History of colonic polyps 10/05/2007  . NEPHROLITHIASIS,  HX OF 10/05/2007    Orientation RESPIRATION BLADDER Height & Weight     Self, Time, Situation, Place  Normal External catheter, Continent Weight: 88.4 kg Height:  5\' 10"  (177.8 cm)  BEHAVIORAL SYMPTOMS/MOOD NEUROLOGICAL BOWEL NUTRITION STATUS      Continent Diet (Heart Healthy)  AMBULATORY STATUS COMMUNICATION OF NEEDS Skin   Extensive Assist (Closed fracture of left hip) Verbally Normal                       Personal Care Assistance Level of Assistance  Bathing, Feeding, Dressing Bathing Assistance: Limited assistance Feeding assistance: Independent Dressing Assistance: Limited assistance     Functional Limitations Info  Sight, Hearing, Speech Sight Info: Adequate Hearing Info: Adequate Speech Info: Adequate    SPECIAL CARE FACTORS FREQUENCY  PT (By licensed PT), OT (By licensed OT)     PT Frequency: 5x week OT Frequency: 5x week            Contractures Contractures Info: Not present    Additional Factors Info  Code Status, Allergies Code Status Info: DNR Allergies Info: Antihistamines, Diphenhydramine type, Aspirin, Codeine, Pollen Extract           Current Medications (08/24/2020):  This is the current hospital active medication list Current Facility-Administered Medications  Medication Dose Route Frequency Provider Last Rate Last Admin  . acetaminophen (TYLENOL) tablet 650 mg  650 mg Oral Q6H PRN Lenore Cordia, MD      . ALPRAZolam Duanne Moron) tablet 0.5 mg  0.5 mg Oral TID PRN Zada Finders R, MD   0.5 mg at 08/23/20 2132  .  amLODipine (NORVASC) tablet 10 mg  10 mg Oral Daily Lenore Cordia, MD   10 mg at 08/24/20 0908  . buPROPion (WELLBUTRIN SR) 12 hr tablet 150 mg  150 mg Oral BID Lenore Cordia, MD   150 mg at 08/24/20 0908  . carvedilol (COREG) tablet 6.25 mg  6.25 mg Oral BID WC Zada Finders R, MD   6.25 mg at 08/24/20 0908  . cycloSPORINE (RESTASIS) 0.05 % ophthalmic emulsion 1 drop  1 drop Both Eyes BID Lenore Cordia, MD   1 drop at  08/24/20 0908  . dutasteride (AVODART) capsule 0.5 mg  0.5 mg Oral Daily Zada Finders R, MD   0.5 mg at 08/24/20 0908  . feeding supplement (ENSURE ENLIVE / ENSURE PLUS) liquid 237 mL  237 mL Oral BID BM Shelly Coss, MD   237 mL at 08/24/20 0909  . irbesartan (AVAPRO) tablet 75 mg  75 mg Oral Daily Zada Finders R, MD   75 mg at 08/24/20 0908  . mirtazapine (REMERON) tablet 15 mg  15 mg Oral QHS Lenore Cordia, MD   15 mg at 08/23/20 2132  . morphine 2 MG/ML injection 2 mg  2 mg Intravenous Q4H PRN Shelly Coss, MD   2 mg at 08/23/20 1730  . polyethylene glycol (MIRALAX / GLYCOLAX) packet 17 g  17 g Oral Daily Shelly Coss, MD   17 g at 08/24/20 0909  . polyvinyl alcohol (LIQUIFILM TEARS) 1.4 % ophthalmic solution 1 drop  1 drop Both Eyes PRN Zada Finders R, MD      . rivaroxaban (XARELTO) tablet 20 mg  20 mg Oral Q supper Lenore Cordia, MD   20 mg at 08/23/20 1729  . senna-docusate (Senokot-S) tablet 1 tablet  1 tablet Oral BID Shelly Coss, MD   1 tablet at 08/24/20 0908  . tamsulosin (FLOMAX) capsule 0.4 mg  0.4 mg Oral QPM Zada Finders R, MD   0.4 mg at 08/23/20 1729  . traMADol (ULTRAM) tablet 50 mg  50 mg Oral Q6H PRN Shelly Coss, MD   50 mg at 08/23/20 2132  . Vitamin D (Ergocalciferol) (DRISDOL) capsule 50,000 Units  50,000 Units Oral Q7 days Shelly Coss, MD   50,000 Units at 08/22/20 2204     Discharge Medications: Please see discharge summary for a list of discharge medications.  Relevant Imaging Results:  Relevant Lab Results:   Additional Information NU#[id number]  Purcell Mouton, RN

## 2020-08-25 DIAGNOSIS — I10 Essential (primary) hypertension: Secondary | ICD-10-CM | POA: Diagnosis not present

## 2020-08-25 DIAGNOSIS — N390 Urinary tract infection, site not specified: Secondary | ICD-10-CM | POA: Diagnosis not present

## 2020-08-25 DIAGNOSIS — I639 Cerebral infarction, unspecified: Secondary | ICD-10-CM | POA: Diagnosis not present

## 2020-08-25 DIAGNOSIS — R41841 Cognitive communication deficit: Secondary | ICD-10-CM | POA: Diagnosis not present

## 2020-08-25 DIAGNOSIS — F418 Other specified anxiety disorders: Secondary | ICD-10-CM | POA: Diagnosis not present

## 2020-08-25 DIAGNOSIS — S72141D Displaced intertrochanteric fracture of right femur, subsequent encounter for closed fracture with routine healing: Secondary | ICD-10-CM | POA: Diagnosis not present

## 2020-08-25 DIAGNOSIS — D649 Anemia, unspecified: Secondary | ICD-10-CM | POA: Diagnosis not present

## 2020-08-25 DIAGNOSIS — R278 Other lack of coordination: Secondary | ICD-10-CM | POA: Diagnosis not present

## 2020-08-25 DIAGNOSIS — I480 Paroxysmal atrial fibrillation: Secondary | ICD-10-CM | POA: Diagnosis not present

## 2020-08-25 DIAGNOSIS — I4891 Unspecified atrial fibrillation: Secondary | ICD-10-CM | POA: Diagnosis not present

## 2020-08-25 DIAGNOSIS — M2550 Pain in unspecified joint: Secondary | ICD-10-CM | POA: Diagnosis not present

## 2020-08-25 DIAGNOSIS — F29 Unspecified psychosis not due to a substance or known physiological condition: Secondary | ICD-10-CM | POA: Diagnosis not present

## 2020-08-25 DIAGNOSIS — S32599A Other specified fracture of unspecified pubis, initial encounter for closed fracture: Secondary | ICD-10-CM | POA: Diagnosis not present

## 2020-08-25 DIAGNOSIS — Z20828 Contact with and (suspected) exposure to other viral communicable diseases: Secondary | ICD-10-CM | POA: Diagnosis not present

## 2020-08-25 DIAGNOSIS — R2681 Unsteadiness on feet: Secondary | ICD-10-CM | POA: Diagnosis not present

## 2020-08-25 DIAGNOSIS — Z8673 Personal history of transient ischemic attack (TIA), and cerebral infarction without residual deficits: Secondary | ICD-10-CM | POA: Diagnosis not present

## 2020-08-25 DIAGNOSIS — E559 Vitamin D deficiency, unspecified: Secondary | ICD-10-CM | POA: Diagnosis not present

## 2020-08-25 DIAGNOSIS — Z7401 Bed confinement status: Secondary | ICD-10-CM | POA: Diagnosis not present

## 2020-08-25 DIAGNOSIS — M1712 Unilateral primary osteoarthritis, left knee: Secondary | ICD-10-CM | POA: Diagnosis not present

## 2020-08-25 DIAGNOSIS — R5381 Other malaise: Secondary | ICD-10-CM | POA: Diagnosis not present

## 2020-08-25 DIAGNOSIS — W19XXXA Unspecified fall, initial encounter: Secondary | ICD-10-CM | POA: Diagnosis not present

## 2020-08-25 DIAGNOSIS — R2689 Other abnormalities of gait and mobility: Secondary | ICD-10-CM | POA: Diagnosis not present

## 2020-08-25 DIAGNOSIS — G4730 Sleep apnea, unspecified: Secondary | ICD-10-CM | POA: Diagnosis not present

## 2020-08-25 DIAGNOSIS — G4733 Obstructive sleep apnea (adult) (pediatric): Secondary | ICD-10-CM | POA: Diagnosis not present

## 2020-08-25 NOTE — TOC Progression Note (Addendum)
Transition of Care [facility]) - Progression Note    Patient Details  Name: Jared [name] MRN: [id number] Date of Birth: 05-01-1941  Transition of Care [facility]) CM/SW Contact  Purcell Mouton, RN Phone Number: 08/25/2020, 12:37 PM  Clinical Narrative:    Pt made aware of insurance authorization for approval of him going to [facility]. PTAR was called and RN is aware. Beth pt's daughter was called to inform her that pt was transportation to [facility] today.    Expected Discharge Plan: [place] Barriers to Discharge: No Barriers Identified  Expected Discharge Plan and Services Expected Discharge Plan: [place] arrangements for the past 2 months: Single Family Home Expected Discharge Date: 08/24/20                                     Social Determinants of Health (SDOH) Interventions    Readmission Risk Interventions No flowsheet data found.

## 2020-08-25 NOTE — Progress Notes (Signed)
Attempted to call report to receiving facility, [facility] receptionist took my name and number stating the nurse will have to call me back for report.

## 2020-08-25 NOTE — Progress Notes (Signed)
Pt being discharged to [facility] at this time. Prior to dc,report was given to [employer], Therapist, sports. IV and tele was removed. Pt discharge instructions regarding pt medications and follow ups. Pt verbalized understanding and stated no concerns at this time. Pt stable at time of dc.

## 2020-08-25 NOTE — TOC Progression Note (Signed)
Transition of Care [facility]) - Progression Note    Patient Details  Name: Jared [name] MRN: [id number] Date of Birth: 1941-05-03  Transition of Care [facility]) CM/SW Contact  Purcell Mouton, RN Phone Number: 08/25/2020, 9:40 AM  Clinical Narrative:    Waiting for insurance authorization from [place].  Expected Discharge Plan: [place] Barriers to Discharge: No Barriers Identified  Expected Discharge Plan and Services Expected Discharge Plan: [place] arrangements for the past 2 months: Single Family Home Expected Discharge Date: 08/24/20                                     Social Determinants of Health (SDOH) Interventions    Readmission Risk Interventions No flowsheet data found.

## 2020-08-25 NOTE — Progress Notes (Signed)
Patient seen and examined at the bedside this morning.  Remains hemodynamically stable.  Comfortable.  Denies any new complaints.  There were no new findings on physical examination today.  Discharge orders and summary have already been placed on 08/24/2020.  He is waiting for bed at a skilled nursing facility.  No changes in the medical management today.

## 2020-08-26 DIAGNOSIS — S32599A Other specified fracture of unspecified pubis, initial encounter for closed fracture: Secondary | ICD-10-CM | POA: Diagnosis not present

## 2020-08-26 DIAGNOSIS — I4891 Unspecified atrial fibrillation: Secondary | ICD-10-CM | POA: Diagnosis not present

## 2020-08-26 DIAGNOSIS — G4730 Sleep apnea, unspecified: Secondary | ICD-10-CM | POA: Diagnosis not present

## 2020-08-26 DIAGNOSIS — I639 Cerebral infarction, unspecified: Secondary | ICD-10-CM | POA: Diagnosis not present

## 2020-08-26 DIAGNOSIS — I10 Essential (primary) hypertension: Secondary | ICD-10-CM | POA: Diagnosis not present

## 2020-08-28 ENCOUNTER — Other Ambulatory Visit: Payer: Self-pay

## 2020-08-28 NOTE — Patient Outreach (Signed)
[facility]) Care Management  08/28/2020  Jared [name] 1941-05-25 [id number]   EMMI- General Discharge RED ON EMMI ALERT Day # 1 Date: 08/27/20 Red Alert Reason:  Scheduled follow up? No   Patient currently in SNF.  Will discontinue EMMI calls.    Plan: RN CM will close case.    Jone Baseman, RN, MSN [facility] Care Management Care Management Coordinator Direct Line [phone] Toll Free: [phone]  Fax: [phone]

## 2020-09-23 ENCOUNTER — Other Ambulatory Visit: Payer: Self-pay

## 2020-09-23 NOTE — Patient Outreach (Signed)
[facility] Customer service manager [facility]) Care Management  09/23/2020  COLTON TASSIN 08-Nov-1940 [id number]     Transition of Care Referral  Referral Date: 09/23/2020 Referral Source: [facility] Discharge Report Date of Discharge: 09/19/2020 Facility: [facility] Insurance: [facility]   Referral received. Transition of care calls being completed via EMMI-automated calls. RN CM will outreach patient for any red flags received.      Plan: RN CM will close case at this time.    Antionette Fairy, RN,BSN,CCM [facility] Care Management Telephonic Care Management Coordinator Direct Phone: [phone] Toll Free: [phone] Fax: [phone]

## 2020-10-09 ENCOUNTER — Other Ambulatory Visit: Payer: Self-pay

## 2020-10-09 ENCOUNTER — Encounter: Payer: Self-pay | Admitting: Internal Medicine

## 2020-10-09 ENCOUNTER — Ambulatory Visit (INDEPENDENT_AMBULATORY_CARE_PROVIDER_SITE_OTHER): Payer: Medicare Other | Admitting: Internal Medicine

## 2020-10-09 DIAGNOSIS — S32592D Other specified fracture of left pubis, subsequent encounter for fracture with routine healing: Secondary | ICD-10-CM | POA: Diagnosis not present

## 2020-10-09 DIAGNOSIS — F3341 Major depressive disorder, recurrent, in partial remission: Secondary | ICD-10-CM

## 2020-10-09 DIAGNOSIS — F1320 Sedative, hypnotic or anxiolytic dependence, uncomplicated: Secondary | ICD-10-CM

## 2020-10-09 DIAGNOSIS — I10 Essential (primary) hypertension: Secondary | ICD-10-CM

## 2020-10-09 DIAGNOSIS — I480 Paroxysmal atrial fibrillation: Secondary | ICD-10-CM | POA: Diagnosis not present

## 2020-10-09 NOTE — Assessment & Plan Note (Signed)
Rate is fine On xarelto 

## 2020-10-09 NOTE — Assessment & Plan Note (Signed)
Mood has been okay on the mirtazapine Uses xanax regularly tid

## 2020-10-09 NOTE — Assessment & Plan Note (Signed)
BP Readings from Last 3 Encounters:  10/09/20 122/84  08/25/20 (!) 167/88  06/18/20 130/80   Okay on the amlodipine and carvedilol

## 2020-10-09 NOTE — Assessment & Plan Note (Signed)
Mostly recovered Ambulating with cane Will be moving into his daughter's house near the [facility]

## 2020-10-09 NOTE — Progress Notes (Signed)
Subjective:    Patient ID: Jared [name], male    DOB: [birth year], 80 y.o.   MRN: [id number]  HPI Here for follow up after pelvic fracture and rehab stay This visit occurred during the SARS-CoV-2 public health emergency.  Safety protocols were in place, including screening questions prior to the visit, additional usage of staff PPE, and extensive cleaning of exam room while observing appropriate contact time as indicated for disinfecting solutions.   [place] 12/3 while walking a dog for a friend--fell backwards onto brick porch Pelvic fracture---seen in ER Got transferred to [facility] ---got COVID (despite having booster) Only sinus drainage though  Did go through rehab and finally sent home Doing home exercise program Is driving Walking with cane Doing food prep and [employer] shopping--is getting meals via [facility]  Now with left knee pain again Did see Dr Veverly Fells about this  Mood has been okay Depression not flared despite all of this Takes the xanax regularly  Moving next week--close to [employer] Will be living with daughter (and her boyfriend and his granddaughter) He has mixed feelings about this  No chest pain No SOB  No palpitations  Current Outpatient Medications on File Prior to Visit  Medication Sig Dispense Refill  . acetaminophen (TYLENOL) 500 MG tablet Take 500 mg by mouth every 6 (six) hours as needed for moderate pain.    Marland Kitchen ALPRAZolam (XANAX) 1 MG tablet Take 0.5 tablets (0.5 mg total) by mouth 3 (three) times daily as needed for anxiety. 10 tablet 0  . amLODipine (NORVASC) 10 MG tablet Take 1 tablet (10 mg total) by mouth daily. 90 tablet 3  . Calcium Carbonate Antacid (TUMS E-X PO) Take 2 tablets by mouth 2 (two) times daily as needed (heartburn).    . carvedilol (COREG) 6.25 MG tablet TAKE 1 TABLET TWICE DAILY WITH MEALS (Patient taking differently: Take 6.25 mg by mouth 2 (two) times daily with a meal.) 180 tablet 3  . docusate sodium  (COLACE) 100 MG capsule Take 1 capsule (100 mg total) by mouth 2 (two) times daily. (Patient taking differently: Take 100 mg by mouth 2 (two) times daily as needed.) 10 capsule 0  . dutasteride (AVODART) 0.5 MG capsule TAKE 1 CAPSULE BY MOUTH EVERY DAY (Patient taking differently: Take 0.5 mg by mouth daily.) 90 capsule 3  . mirtazapine (REMERON) 15 MG tablet TAKE 1 TABLET BY MOUTH EVERYDAY AT BEDTIME (Patient taking differently: Take 15 mg by mouth at bedtime.) 90 tablet 3  . polyethylene glycol (MIRALAX / GLYCOLAX) 17 g packet Take 17 g by mouth 2 (two) times daily. (Patient taking differently: Take 17 g by mouth 2 (two) times daily as needed.) 14 each 0  . Propylene Glycol 0.6 % SOLN Place 1 drop into both eyes daily as needed (dry eyes).    . RESTASIS 0.05 % ophthalmic emulsion Place 1 drop 2 (two) times daily into both eyes.  4  . rivaroxaban (XARELTO) 20 MG TABS tablet Take 1 tablet (20 mg total) by mouth daily with supper. 90 tablet 3  . tamsulosin (FLOMAX) 0.4 MG CAPS capsule Take 1 capsule (0.4 mg total) by mouth every evening. 90 capsule 3  . timolol (TIMOPTIC) 0.5 % ophthalmic solution Place 1 drop into both eyes 2 (two) times daily as needed (dry eyes).   0  . traMADol (ULTRAM) 50 MG tablet Take 1 tablet (50 mg total) by mouth every 6 (six) hours as needed for moderate pain. 15 tablet 0  .  valsartan (DIOVAN) 80 MG tablet TAKE 1 TABLET EVERY DAY (Patient taking differently: Take 80 mg by mouth daily.) 90 tablet 3  . Vitamin D, Ergocalciferol, (DRISDOL) 1.25 MG (50000 UNIT) CAPS capsule Take 1 capsule (50,000 Units total) by mouth every 7 (seven) days. (Patient not taking: Reported on 10/09/2020) 5 capsule    No current facility-administered medications on file prior to visit.    Allergies  Allergen Reactions  . Antihistamines, Diphenhydramine-Type     Blood in urine   . Aspirin     Blood in urine   . Codeine Nausea And Vomiting    Must take with food  . Pollen Extract Cough     Past Medical History:  Diagnosis Date  . Allergic rhinitis   . Anticoagulant long-term use    xarelto  . Anxiety   . Benign localized prostatic hyperplasia with lower urinary tract symptoms (LUTS)   . Benzodiazepine dependence ([place])   . Constipation   . Diverticulosis   . Edema of right lower extremity   . GERD (gastroesophageal reflux disease)   . Heart murmur   . Hematuria   . Hepatitis    History of  . History of colonic polyps   . History of CVA (cerebrovascular accident) without residual deficits 07/26/2017   crytogenic stroke -- acute left caudate body infarct   . History of kidney stones   . Hypertension   . Ischemic cardiomyopathy 07/26/2017   ef 35-40%;    TEE 07-31-2017 ef 60-65%  . Left anterior fascicular block 02/26/2019   Noted on EKG  . LVH (left ventricular hypertrophy) 02/26/2019   Noted on EKG   . MDD (major depressive disorder), recurrent, in partial remission ([place])   . OA (osteoarthritis)    knees,   . OSA (obstructive sleep apnea)    intolerant cpap  . PAF (paroxysmal atrial fibrillation) [facility])    cardiologist-  dr g. taylor  . Skin cancer    Childhood, right foot on toe  . Status post placement of implantable loop recorder 07/31/2017  . Swelling of right foot 12/2018  . Throat mass   . Wears glasses     Past Surgical History:  Procedure Laterality Date  . APPENDECTOMY  1970s  . CATARACT EXTRACTION W/ INTRAOCULAR LENS  IMPLANT, BILATERAL Bilateral 2014  . CHOLECYSTECTOMY  1970s  . COLONOSCOPY  2014  . CYSTOSCOPY    . FEMUR IM NAIL Right 02/27/2019   Procedure: INTRAMEDULLARY (IM) NAIL FEMORAL;  Surgeon: Rod Can, MD;  Location: [facility];  Service: Orthopedics;  Laterality: Right;  . FOOT SURGERY Left yrs ago  . HERNIA REPAIR  2014  . INCISION AND DRAINAGE  04/28/2020   Procedure: INCISION AND DRAINAGE;  Surgeon: Rolm Bookbinder, MD;  Location: [facility];  Service: General;;  . IR URETERAL STENT LEFT NEW ACCESS W/O SEP NEPHROSTOMY  CATH  12/17/2019  . LOOP RECORDER INSERTION N/A 07/31/2017   Procedure: LOOP RECORDER INSERTION;  Surgeon: Evans Lance, MD;  Location: [place] [facility];  Service: Cardiovascular;  Laterality: N/A;  . NEPHROLITHOTOMY Left 12/17/2019   Procedure: NEPHROLITHOTOMY PERCUTANEOUS;  Surgeon: Kathie Rhodes, MD;  Location: [facility];  Service: Urology;  Laterality: Left;  . TEE WITHOUT CARDIOVERSION N/A 07/31/2017   Procedure: TRANSESOPHAGEAL ECHOCARDIOGRAM (TEE) WITH LOOP;  Surgeon: Dorothy Spark, MD;  Location: [facility];  Service: Cardiovascular;  Laterality: N/A;  . TOTAL KNEE ARTHROPLASTY Left 02/22/2019   Procedure: TOTAL KNEE ARTHROPLASTY;  Surgeon: [name], MD;  Location: [facility];  Service: Orthopedics;  Laterality: Left;  . WRIST FRACTURE SURGERY  1970s   right x2  unsure if any hardware remain   ;    left x1 with pinning then removed    Family History  Problem Relation Age of Onset  . Heart disease Mother   . Heart disease Father   . Colon cancer Maternal Aunt 70    Social History   Socioeconomic History  . Marital status: Widowed    Spouse name: Not on file  . Number of children: 2  . Years of education: Not on file  . Highest education level: Not on file  Occupational History  . Occupation: Nurse, learning disability    Comment: retired  Tobacco Use  . Smoking status: Never Smoker  . Smokeless tobacco: Never Used  Vaping Use  . Vaping Use: Never used  Substance and Sexual Activity  . Alcohol use: No  . Drug use: Never  . Sexual activity: Not on file  Other Topics Concern  . Not on file  Social History Narrative   Divorced twice---first wife died   2 children---daughter local, son in [place]      Has living will   Health care POA is daughter   Would accept resuscitation   No prolonged tube feeds if cognitively unaware   Social Determinants of Health   Financial Resource Strain: Not on file  Food Insecurity: Not on file  Transportation Needs: Not on file   Physical Activity: Not on file  Stress: Not on file  Social Connections: Not on file  Intimate Partner Violence: Not on file   Review of Systems Eating okay--appetite is good Sleeps well Notices urinary urgency when he stands--has to empty bladder. Using depends for some leakage    Objective:   Physical Exam Constitutional:      Appearance: Normal appearance.  Cardiovascular:     Rate and Rhythm: Normal rate. Rhythm irregular.     Heart sounds: No murmur heard. No gallop.   Pulmonary:     Effort: Pulmonary effort is normal.     Breath sounds: Normal breath sounds. No wheezing or rales.  Musculoskeletal:     Cervical back: Neck supple.     Right lower leg: No edema.     Left lower leg: No edema.  Lymphadenopathy:     Cervical: No cervical adenopathy.  Neurological:     Mental Status: He is alert.  Psychiatric:        Mood and Affect: Mood normal.        Behavior: Behavior normal.            Assessment & Plan:

## 2020-10-09 NOTE — Assessment & Plan Note (Signed)
Uses the xanax 2-3 times a day

## 2020-10-12 ENCOUNTER — Other Ambulatory Visit: Payer: Self-pay | Admitting: Internal Medicine

## 2020-10-14 DIAGNOSIS — M25552 Pain in left hip: Secondary | ICD-10-CM | POA: Diagnosis not present

## 2020-10-19 ENCOUNTER — Other Ambulatory Visit: Payer: Self-pay

## 2020-10-19 MED ORDER — RIVAROXABAN 20 MG PO TABS: 20.0000 mg | ORAL_TABLET | Freq: Every day | ORAL | 11 refills | Status: AC

## 2020-10-19 NOTE — Telephone Encounter (Signed)
Received a [facility] for [facility]. Called pt to verify if that was correct. He said he no longer has [employer] order. He also said he has been without the Xarelto for awhile.

## 2020-11-02 ENCOUNTER — Other Ambulatory Visit: Payer: Self-pay | Admitting: *Deleted

## 2020-11-02 MED ORDER — ALPRAZOLAM 1 MG PO TABS: 0.5000 mg | ORAL_TABLET | Freq: Three times a day (TID) | ORAL | 0 refills | Status: AC | PRN

## 2020-11-02 MED ORDER — ALPRAZOLAM 1 MG PO TABS: 0.5000 mg | ORAL_TABLET | Freq: Three times a day (TID) | ORAL | 0 refills | Status: DC | PRN

## 2020-11-02 NOTE — Telephone Encounter (Signed)
Patient's daughter Eustaquio Maize (on [place]) left a voicemail stating that her dad has moved to the [place] area to live with her. Patient's daughter stated that he is scheduled to see his new provider on 11/13/20. Patient's daughter stated that he has been out of his Xanax for about a week and wants to know if it can be refilled until his appointment. Patient's daughter stated that she would like [facility].  Last office visit 10/09/20 Last refill 08/24/20

## 2020-11-20 ENCOUNTER — Telehealth: Payer: Self-pay | Admitting: Emergency Medicine

## 2020-11-20 NOTE — Telephone Encounter (Signed)
Patient notified that alert received that [facility] at [facility]. Patient reports he now lives at the [facility] and will no longer be followed by [facility]. Instructed to unplug remote monitor and he does not wish to have loop recorder extracted.

## 2020-12-11 ENCOUNTER — Encounter: Payer: Self-pay | Admitting: Internal Medicine

## 2020-12-30 ENCOUNTER — Other Ambulatory Visit: Payer: Self-pay | Admitting: Internal Medicine

## 2021-01-05 ENCOUNTER — Telehealth: Payer: Self-pay

## 2021-01-05 NOTE — Telephone Encounter (Signed)
Patient is requesting a refill on Omeprazole 10 mg. It appears that this order has been discontinued when the patient was admitted to the hospital in December of 2021 by the hospitalist. Please advise.

## 2021-01-06 NOTE — Telephone Encounter (Signed)
The patient called the pharmacy and the pharmacy called and stated that he was requesting a refill.

## 2021-01-06 NOTE — Telephone Encounter (Signed)
Spoke to pt. He is no longer seeing Dr Silvio Pate. I will remove Dr Silvio Pate as his PCP.

## 2021-01-06 NOTE — Telephone Encounter (Signed)
He moved to live with his daughter. Did the pt call for a refill or did the pharmacy request it?

## 2021-01-06 NOTE — Telephone Encounter (Signed)
I didn't even realize they made a 10mg  Okay to refill for a year though

## 2021-01-22 ENCOUNTER — Other Ambulatory Visit: Payer: Self-pay | Admitting: Internal Medicine

## 2021-03-31 ENCOUNTER — Other Ambulatory Visit: Payer: Self-pay | Admitting: Internal Medicine

## 2021-04-09 ENCOUNTER — Other Ambulatory Visit: Payer: Self-pay | Admitting: Cardiology

## 2021-04-09 NOTE — Telephone Encounter (Signed)
Jared [name] on front phones said that someone was on phone from [facility] about refusal of refill for Mirtazapine. I advised [facility] that CMA denied due to pt no longer under providers care. Advised that PCP now listed was Dr [name] and contact Dr Forde Dandy office at [phone]. [facility] Poet will contact Dr Forde Dandy office about mirtazapine refill. Nothing further needed at this time. Sending note to [facility] CMA.

## 2021-04-29 ENCOUNTER — Other Ambulatory Visit: Payer: Self-pay | Admitting: Internal Medicine

## 2021-06-04 ENCOUNTER — Other Ambulatory Visit: Payer: Self-pay | Admitting: Internal Medicine

## 2021-06-21 ENCOUNTER — Ambulatory Visit: Payer: Medicare HMO

## 2021-06-24 ENCOUNTER — Other Ambulatory Visit: Payer: Medicare HMO

## 2021-06-30 ENCOUNTER — Ambulatory Visit: Payer: Medicare HMO | Admitting: Internal Medicine

## 2021-07-01 ENCOUNTER — Other Ambulatory Visit: Payer: Self-pay | Admitting: Internal Medicine

## 2021-07-22 ENCOUNTER — Other Ambulatory Visit: Payer: Self-pay | Admitting: Internal Medicine

## 2021-10-05 ENCOUNTER — Other Ambulatory Visit: Payer: Self-pay | Admitting: Internal Medicine

## 2022-03-19 DEATH — deceased
